# Patient Record
Sex: Male | Born: 1945 | Race: White | Hispanic: No | Marital: Married | Smoking: Former smoker
Health system: Southern US, Community
[De-identification: ages and names within clinical notes are randomized; demographics above are authoritative.]

## PROBLEM LIST (undated history)

## (undated) DIAGNOSIS — R011 Cardiac murmur, unspecified: Secondary | ICD-10-CM

## (undated) DIAGNOSIS — E039 Hypothyroidism, unspecified: Secondary | ICD-10-CM

## (undated) DIAGNOSIS — I517 Cardiomegaly: Secondary | ICD-10-CM

## (undated) DIAGNOSIS — E119 Type 2 diabetes mellitus without complications: Secondary | ICD-10-CM

## (undated) DIAGNOSIS — E785 Hyperlipidemia, unspecified: Secondary | ICD-10-CM

## (undated) DIAGNOSIS — K219 Gastro-esophageal reflux disease without esophagitis: Secondary | ICD-10-CM

## (undated) DIAGNOSIS — R1310 Dysphagia, unspecified: Secondary | ICD-10-CM

## (undated) DIAGNOSIS — C189 Malignant neoplasm of colon, unspecified: Secondary | ICD-10-CM

## (undated) DIAGNOSIS — F17201 Nicotine dependence, unspecified, in remission: Secondary | ICD-10-CM

## (undated) DIAGNOSIS — I2510 Atherosclerotic heart disease of native coronary artery without angina pectoris: Secondary | ICD-10-CM

## (undated) DIAGNOSIS — I219 Acute myocardial infarction, unspecified: Secondary | ICD-10-CM

## (undated) DIAGNOSIS — I10 Essential (primary) hypertension: Secondary | ICD-10-CM

## (undated) DIAGNOSIS — M47812 Spondylosis without myelopathy or radiculopathy, cervical region: Secondary | ICD-10-CM

## (undated) DIAGNOSIS — N3090 Cystitis, unspecified without hematuria: Secondary | ICD-10-CM

## (undated) DIAGNOSIS — I493 Ventricular premature depolarization: Secondary | ICD-10-CM

## (undated) HISTORY — DX: Hyperlipidemia, unspecified: E78.5

## (undated) HISTORY — PX: CHOLECYSTECTOMY: SHX55

## (undated) HISTORY — DX: Type 2 diabetes mellitus without complications: E11.9

## (undated) HISTORY — PX: FACIAL FRACTURE SURGERY: SHX1570

## (undated) HISTORY — PX: MOUTH SURGERY: SHX715

## (undated) HISTORY — PX: OTHER SURGICAL HISTORY: SHX169

## (undated) HISTORY — DX: Nicotine dependence, unspecified, in remission: F17.201

## (undated) HISTORY — DX: Atherosclerotic heart disease of native coronary artery without angina pectoris: I25.10

## (undated) HISTORY — DX: Ventricular premature depolarization: I49.3

## (undated) HISTORY — DX: Essential (primary) hypertension: I10

---

## 2000-02-17 ENCOUNTER — Inpatient Hospital Stay (HOSPITAL_COMMUNITY): Admission: EM | Admit: 2000-02-17 | Discharge: 2000-02-21 | Payer: Self-pay | Admitting: Emergency Medicine

## 2000-02-17 ENCOUNTER — Encounter: Payer: Self-pay | Admitting: Cardiology

## 2000-08-15 ENCOUNTER — Ambulatory Visit (HOSPITAL_COMMUNITY): Admission: RE | Admit: 2000-08-15 | Discharge: 2000-08-16 | Payer: Self-pay | Admitting: *Deleted

## 2000-08-17 ENCOUNTER — Emergency Department (HOSPITAL_COMMUNITY): Admission: EM | Admit: 2000-08-17 | Discharge: 2000-08-17 | Payer: Self-pay | Admitting: Emergency Medicine

## 2000-10-23 ENCOUNTER — Encounter: Payer: Self-pay | Admitting: *Deleted

## 2000-10-23 ENCOUNTER — Encounter: Payer: Self-pay | Admitting: Cardiothoracic Surgery

## 2000-10-23 HISTORY — PX: CORONARY ARTERY BYPASS GRAFT: SHX141

## 2000-10-24 ENCOUNTER — Encounter: Payer: Self-pay | Admitting: Cardiothoracic Surgery

## 2000-10-24 ENCOUNTER — Inpatient Hospital Stay (HOSPITAL_COMMUNITY): Admission: AD | Admit: 2000-10-24 | Discharge: 2000-10-28 | Payer: Self-pay | Admitting: *Deleted

## 2000-10-25 ENCOUNTER — Encounter: Payer: Self-pay | Admitting: Cardiothoracic Surgery

## 2000-10-26 ENCOUNTER — Encounter: Payer: Self-pay | Admitting: Cardiothoracic Surgery

## 2000-11-06 DIAGNOSIS — M47812 Spondylosis without myelopathy or radiculopathy, cervical region: Secondary | ICD-10-CM

## 2000-11-06 HISTORY — DX: Spondylosis without myelopathy or radiculopathy, cervical region: M47.812

## 2001-03-08 ENCOUNTER — Ambulatory Visit (HOSPITAL_COMMUNITY): Admission: RE | Admit: 2001-03-08 | Discharge: 2001-03-08 | Payer: Self-pay | Admitting: Neurosurgery

## 2001-03-08 ENCOUNTER — Encounter: Payer: Self-pay | Admitting: Neurosurgery

## 2001-03-20 ENCOUNTER — Observation Stay (HOSPITAL_COMMUNITY): Admission: RE | Admit: 2001-03-20 | Discharge: 2001-03-21 | Payer: Self-pay | Admitting: Neurosurgery

## 2001-03-20 ENCOUNTER — Encounter: Payer: Self-pay | Admitting: Neurosurgery

## 2001-03-20 HISTORY — PX: ANTERIOR CERVICAL DISCECTOMY: SHX1160

## 2001-03-24 ENCOUNTER — Emergency Department (HOSPITAL_COMMUNITY): Admission: EM | Admit: 2001-03-24 | Discharge: 2001-03-24 | Payer: Self-pay | Admitting: *Deleted

## 2001-03-24 ENCOUNTER — Encounter: Payer: Self-pay | Admitting: Neurosurgery

## 2001-04-15 ENCOUNTER — Encounter: Payer: Self-pay | Admitting: Neurosurgery

## 2001-04-15 ENCOUNTER — Encounter: Admission: RE | Admit: 2001-04-15 | Discharge: 2001-04-15 | Payer: Self-pay | Admitting: Neurosurgery

## 2001-05-21 ENCOUNTER — Encounter: Admission: RE | Admit: 2001-05-21 | Discharge: 2001-05-21 | Payer: Self-pay | Admitting: Neurosurgery

## 2001-05-21 ENCOUNTER — Encounter: Payer: Self-pay | Admitting: Neurosurgery

## 2005-01-24 ENCOUNTER — Ambulatory Visit: Payer: Self-pay | Admitting: Cardiology

## 2005-01-30 ENCOUNTER — Ambulatory Visit: Payer: Self-pay

## 2006-02-22 ENCOUNTER — Ambulatory Visit: Payer: Self-pay | Admitting: Cardiology

## 2006-03-09 ENCOUNTER — Ambulatory Visit: Payer: Self-pay

## 2007-02-01 ENCOUNTER — Ambulatory Visit: Payer: Self-pay | Admitting: Cardiology

## 2007-08-29 ENCOUNTER — Ambulatory Visit: Payer: Self-pay | Admitting: Cardiology

## 2008-02-18 ENCOUNTER — Ambulatory Visit: Payer: Self-pay | Admitting: Cardiology

## 2008-09-03 ENCOUNTER — Ambulatory Visit: Payer: Self-pay | Admitting: Cardiovascular Disease

## 2009-02-19 DIAGNOSIS — E785 Hyperlipidemia, unspecified: Secondary | ICD-10-CM | POA: Insufficient documentation

## 2009-02-19 DIAGNOSIS — I10 Essential (primary) hypertension: Secondary | ICD-10-CM

## 2009-02-19 DIAGNOSIS — I25810 Atherosclerosis of coronary artery bypass graft(s) without angina pectoris: Secondary | ICD-10-CM | POA: Insufficient documentation

## 2009-02-19 DIAGNOSIS — E119 Type 2 diabetes mellitus without complications: Secondary | ICD-10-CM

## 2009-02-22 ENCOUNTER — Encounter: Payer: Self-pay | Admitting: Cardiovascular Disease

## 2009-02-22 ENCOUNTER — Ambulatory Visit: Payer: Self-pay | Admitting: Cardiovascular Disease

## 2009-02-22 DIAGNOSIS — I10 Essential (primary) hypertension: Secondary | ICD-10-CM | POA: Insufficient documentation

## 2009-08-23 ENCOUNTER — Ambulatory Visit: Payer: Self-pay | Admitting: Cardiovascular Disease

## 2009-08-23 ENCOUNTER — Telehealth (INDEPENDENT_AMBULATORY_CARE_PROVIDER_SITE_OTHER): Payer: Self-pay

## 2009-08-23 DIAGNOSIS — I2510 Atherosclerotic heart disease of native coronary artery without angina pectoris: Secondary | ICD-10-CM | POA: Insufficient documentation

## 2009-08-23 DIAGNOSIS — R5383 Other fatigue: Secondary | ICD-10-CM

## 2009-08-23 DIAGNOSIS — R5381 Other malaise: Secondary | ICD-10-CM

## 2009-08-24 ENCOUNTER — Encounter: Payer: Self-pay | Admitting: Cardiovascular Disease

## 2009-08-24 ENCOUNTER — Encounter (HOSPITAL_COMMUNITY): Admission: RE | Admit: 2009-08-24 | Discharge: 2009-08-24 | Payer: Self-pay | Admitting: Internal Medicine

## 2009-08-24 ENCOUNTER — Ambulatory Visit (HOSPITAL_COMMUNITY): Admission: RE | Admit: 2009-08-24 | Discharge: 2009-08-24 | Payer: Self-pay | Admitting: Internal Medicine

## 2009-08-24 ENCOUNTER — Ambulatory Visit: Payer: Self-pay | Admitting: Internal Medicine

## 2009-08-24 ENCOUNTER — Ambulatory Visit: Payer: Self-pay

## 2009-10-13 ENCOUNTER — Encounter (INDEPENDENT_AMBULATORY_CARE_PROVIDER_SITE_OTHER): Payer: Self-pay | Admitting: *Deleted

## 2011-03-21 NOTE — Assessment & Plan Note (Signed)
[facility]                            CARDIOLOGY OFFICE NOTE   NAME:[name], [name] SAO                         MRN:          [id number]  DATE:08/29/2007                            DOB:          [birth year]    PRIMARY CARE PHYSICIAN:  [name] [name], M.D.   REASON FOR VISIT:  Cardiac followup.   HISTORY OF PRESENT ILLNESS:  [name] reports he has been doing well  since his last visit in March.  He is not having any angina or  breathlessness.  He states that Dr. Jeannetta [name] has been following his  cholesterol, and he is on the medications outlined below.  Today's  electrocardiogram shows sinus bradycardia at 56 beats per minute with  borderline inferior Q waves, most noted in lead III (old).  [name] is  not experiencing any palpitations or syncope.  He admits that he has not  been exercising at all.  I spoke with him about considering a regular  exercise regimen such as walking.   ALLERGIES:  Some type of ANTIBIOTIC.   PRESENT MEDICATIONS:  1. Protonix 40 mg p.o. daily.  2. Lovastatin 40 mg p.o. daily.  3. Levoxyl 100 mg p.o. daily.  4. Aspirin 81 mg p.o. daily.  5. Niacin 500 mg p.o. daily.  6. Metoprolol 25 mg p.o. b.i.d.  7. Omega 3 supplements 1000 mg p.o. daily.  8. Metformin 500 mg 1 tablet in the morning and 1/2 tablet in the      evening.  9. Sublingual nitroglycerin p.r.n. (has not needed).   PHYSICAL EXAMINATION:  Blood pressure 133/76, heart rate 56, weight 223  pounds, up from 218.  This is an overweight male in no acute distress.  Examination of the neck reveals no elevated jugular venous pressure, no  loud bruits, no thyromegaly noted.  LUNGS:  Clear without labored breathing.  CARDIAC:  Regular rate and rhythm.  No S3 gallop.  EXTREMITIES:  No significant pitting edema.   IMPRESSION/RECOMMENDATIONS:  Coronary artery disease with previous  inferior wall myocardial infarction and subsequent coronary artery  bypass grafting.  Myoview  in May, 2007 showed no large areas of  ischemia.  He has developed no new symptoms, and at this point, we will  plan to continue medical therapy and observation.  I have recommended  aggressive LDL  control around 70.  He continues on Lovastatin at 40 mg daily.  Otherwise, we talked about a basic walking regimen, and I will plan to  see him back over the next six months.     [name] Sidle, MD  Electronically Signed    SGM/MedQ  DD: 08/29/2007  DT: 08/30/2007  Job #: [id number]   cc:   [name] [name], M.D.

## 2011-03-21 NOTE — Assessment & Plan Note (Signed)
[facility]ALTH[facility]ARE                            CARDIOLOGY OFFICE NOTE   NAME:[name] [name], [name] [name]                         MRN:          [id number]  DATE:09/03/2008                            DOB:          1946/02/26    PRIMARY CARE PHYSICIAN:  Windle Guard, MD   REASON FOR VISIT:  Cardiology followup.   HISTORY OF PRESENT ILLNESS:  Mr. Soltys is a 66 year old Caucasian male  with past medical history significant for coronary artery disease status  post 3-vessel coronary artery bypass grafting surgery in 2001,  hypertension, diabetes mellitus and hyperlipidemia as well as remote  tobacco abuse, who has recently been followed by Dr. Nona Dell.  Dr. Diona Browner is no longer seeing the patient in this clinic.  The  patient comes in today for his first visit with me.  He denies any chest  discomfort, shortness of breath with exertion, palpitations, dizziness,  near syncope, syncope, orthopnea, PND, or lower extremity edema.  He has  not described any signs or symptoms that are suggestive of angina,  arrhythmias, or congestive heart failure.  He is tolerating his present  medications and does not describe any adverse side effects.   ALLERGIES:  No known drug allergies.   CURRENT MEDICATIONS:  1. Lovastatin 40 mg once daily.  2. Levoxyl 100 mg once daily.  3. Aspirin 81 mg once daily.  4. Metoprolol 25 mg twice daily.  5. Fish oil 1 g once daily.  6. Nexium 40 mg once daily.  7. Niacin 1 g b.i.d.  8. Metformin 1 g in the morning and 500 mg in the evening.  9. Nitroglycerin sublingual p.r.n.   PAST MEDICAL HISTORY:  1. Coronary artery disease, status post 3-vessel CABG in 2001.  2. Hypertension.  3. Hyperlipidemia.  4. Diabetes mellitus.  5. Tobacco abuse with 3 packs per day smoked for 35 years.  However,      the patient has not smoked in the last 8 years.   PAST SURGICAL HISTORY:  1. Cholecystectomy.  2. Bone spur removed from the right shoulder.  3.  Three-vessel CABG in 2001.  4. Pineal cyst removal.  5. Neck surgery.   REVIEW OF SYSTEMS:  As stated in the history present illness and is  otherwise negative.   PHYSICAL EXAMINATION:  VITALS:  Blood pressure 130/75, pulse 62 and  regular, respirations 12 and unlabored.  GENERAL:  He is a middle-aged Caucasian male, in no acute distress.  His  alert and oriented x3.  SKIN:  Warm and dry.  NECK:  No JVD.  No carotid bruits.  No thyromegaly.  No lymphadenopathy.  MUSCULOSKELETAL:  Muscle strength and tone is normal.  NEUROLOGICAL:  No focal neurological deficits.  PSYCHIATRIC:  Mood and affect are appropriate.  LUNGS:  Clear to auscultation bilaterally without wheezes, rhonchi, or  crackles noted.  CARDIOVASCULAR:  Regular rate and rhythm without murmurs, gallops, or  rubs noted.  ABDOMEN:  Soft, nontender, nondistended.  Bowel sounds are present.  EXTREMITIES:  No evidence of edema.  Pulses are 2+ in all extremities.   ASSESSMENT  AND PLAN:  This is a pleasant 65 year old Caucasian male with  past medical history significant for coronary artery disease status post  3-vessel CABG in 2001, diabetes mellitus, hypertension, hyperlipidemia,  and remote tobacco abuse who presents for routine cardiology followup.  The patient seems to be doing well from a cardiovascular standpoint.  He  is on appropriate medications for his coronary artery disease.  He is  tolerating all of his medications well currently.  I would like to see  him back in this office in 6 months.  I will make no changes in his  medicines in the meantime.  He is aware that he should call our office  if he has any change in his clinical status over the neck 6 months.     Verne Carrow, MD  Electronically Signed    CM/MedQ  DD: 09/03/2008  DT: 09/04/2008  Job #: [id number]   cc:   Windle Guard, M.D.

## 2011-03-21 NOTE — Assessment & Plan Note (Signed)
[facility]                            CARDIOLOGY OFFICE NOTE   [name] [name], [name] [name]                         MRN:          [id number]  DATE:02/18/2008                            DOB:          [birth year]    PRIMARY CARE PHYSICIAN:  [name] [name], M.D.   REASON FOR VISIT:  Cardiology follow-up.   HISTORY OF PRESENT ILLNESS:  [name] is doing well.  He just had his  physical earlier today with Dr. Jeannetta [name].  He reports no significant  angina and stable NYHA class II dyspnea on exertion.  His  electrocardiogram shows sinus bradycardia with otherwise no significant  changes new over baseline tracing last year.  He is tolerating his  present medications and I do note that his niacin has been further  advanced.   ALLERGIES:  No known drug allergies.   MEDICATIONS:  1. Lovastatin 40 mg p.o. daily.  2. Levoxyl 100 mg p.o. daily.  3. Aspirin 81 mg p.o. daily.  4. Metoprolol 25 mg p.o. b.i.d.  5. Omega-3 supplements 1000 mg p.o. daily.  6. Nexium 40 mg p.o. daily.  7. Niacin 1000 mg p.o. b.i.d.  8. Metformin 1000 mg the morning and 500 mg in the evening.  9. Sublingual nitroglycerin p.r.n.   REVIEW OF SYSTEMS:  As described in the history of present illness.  Otherwise negative.   EXAMINATION:  Blood pressure is 150/82, heart rate is 56 weight.  Weight  is 2 23 pounds, which is stable.  He is an overweight male in no acute distress.  HEENT:  Conjunctivae and lids normal.  Pharynx is clear.  NECK:  Supple.  No elevated jugular venous pressure, no loud bruits.  No  thyromegaly is noted.  LUNGS:  Clear without labored breathing at rest.  CARDIAC:  A regular rate and rhythm.  No loud murmur or gallop.  ABDOMEN:  Soft, nontender.  EXTREMITIES:  No frank pitting edema.  Distal pulses 2+.   IMPRESSION AND RECOMMENDATIONS:  Multivessel coronary artery disease,  status post previous inferior wall myocardial infarction with subsequent  coronary artery bypass  grafting.  The patient is stable in terms of symptoms on a good medical regimen.  I  will plan to see him back over the next 6 months.     [name] Sidle, MD  Electronically Signed    SGM/MedQ  DD: 02/18/2008  DT: 02/18/2008  Job #: [id number]   cc:   [name] [name], M.D.

## 2011-03-24 NOTE — Discharge Summary (Signed)
[place]  Patient:    [name] [name], [name] [name]                         MRN: [id number] Adm. Date:  [id number] Disc. Date: 08/16/00 Attending:  Veneda Melter Dictator:   Tereso Newcomer, P.A. CC:         Hadassah Pais. Jeannetta Nap, M.D., phone # [phone]   Referring Physician Discharge [place]  DATE OF BIRTH:  April 09, 1946  REASON FOR ADMISSION:  Outpatient catheterization secondary to dyspnea on exertion.  PROCEDURES PERFORMED THIS ADMISSION: 1. Cardiac catheterization on August 15, 2000 by Dr. Veneda Melter, revealing    left main distal 20%; LAD 30-40% proximally, 80% mid after diagonal;    left circumflex with two OMs, 30%; RCA dominant; PDA and two PD    branches, 70% proximal/mid at stent #1, 50% proximal PDA; no MR; EF    greater than 55%. 2. Status post PTCA/stent to proximal RCA between previous two stents. 3. Status post PTA to right iliac secondary to iatrogenic dissection,    resulting in compromise of the lumen by approximately 70%.  DISCHARGE DIAGNOSES: 1. Coronary artery disease. 2. 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  Complicated by periprocedure ventricular fibrillation and    shock x 3. Cardiolite study Mar 30, 1999 with inferobasilar wall infarct    and mild to moderate peri-infarct ischemia involving the inferior wall    from apex to base - ejection fraction 71%. 4. Low HDL. 5. History of tobacco use. 6. Remote gallbladder surgery.  ADMISSION HISTORY:  This 65 year old male with the above-noted history was seen in the office on August 10, 2000.  He presented mainly at the urging of his family members for complaints of increasing fatigue and dyspnea on exertion.  He had some chest pain, but this was different from his pre MI symptoms.  Prior to his MI, he had pain across his shoulders.  In the office he was complaining of vague, migrating chest pain, that he related to gas.  He did not take  any nitroglycerin.  INITIAL PHYSICAL EXAMINATION IN THE OFFICE:  GENERAL:  Revealed an alert, well-nourished male in no acute distress.  VITAL SIGNS:  Blood pressure 140/90, weight 227 pounds, pulse 51.  NECK:  Without bruits, no JVD.  CHEST:  Clear to auscultation.  CARDIAC:  Regular rhythm without murmur, rub, or gallop.  ABDOMEN:  Nontender, no hepatosplenomegaly.  EXTREMITIES:  Without edema.  LABORATORY DATA:  EKG normal sinus rhythm, sinus bradycardia, no acute changes.  HOSPITAL COURSE:  It was decided that the patient should be scheduled for outpatient catheterization to evaluate his symptoms.  He came into [facility] on August 15, 2000.  The procedure was performed on the same day, and the results are noted above.  Please refer to the dictated catheterization note by Dr. Chales Abrahams for complete details.  The patient tolerated the procedure well, had no immediate complications.  On the morning of August 16, 2000 he was found to be in stable condition.  His right groin wound was without hematoma, bruits.  Laboratory prior to discharge showed white count 10,200; hemoglobin 14; hematocrit 39.9; platelet count 142,000.  Glucose 119, sodium 137, potassium 4.2, chloride 101, CO2 29, BUN 12, creatinine 1.1.  Therefore, at the time of this dictation, it was decided that after the patients rest period was up and his Integrilin was finished, as long  as he was stable, he could be discharged to home.  DISCHARGE MEDICATIONS: 1. Plavix 75 mg q.d. x one month. 2. Toprol XL 25 mg q.d. 3. Niaspan 500 mg q.h.s. 4. Coated aspirin 325 mg q.d. 5. Nitroglycerin 0.4 mg sublingual p.r.n. chest pain.  ACTIVITY:  No driving, heavy lifting, exertion, or sex for three days.  DIET:  Low fat, low salt diet.  WOUND CARE:  The patient should watch his groin for any increased swelling, bleeding, or bruising and call our office with concerns.  FOLLOW-UP:  With Dr. Andee Lineman in two weeks. DD:   08/16/00 TD:  08/16/00 Job: [id number]

## 2011-03-24 NOTE — Cardiovascular Report (Signed)
[place]  Patient:    [name] [name], [name] [name]                         MRN: [id number] Proc. Date: 10/23/00 Adm. Date:  [id number] Attending:  Waldo Laine CC:         Lewayne Bunting, M.D.             Hadassah Pais. Jeannetta Nap, M.D.                        Cardiac Catheterization  PROCEDURES PERFORMED: 1. Left heart catheterization. 2. Left ventriculogram. 3. Selective coronary angiography. 4. Abdominal aortogram. 5. Percutaneous transluminal coronary angioplasty and rotational    atherectomy of the distal left anterior descending.  DIAGNOSES: 1. Two-vessel coronary artery disease. 2. Normal left ventricular systolic function.  INDICATIONS:  [name] [name] is a 65 year old white male with a known history of coronary artery disease, who has suffered an inferior wall myocardial infarction, April of 2001.  He was treated with acute intervention to the LAD. The patient was then treated medically but presented with recurrence of discomfort.  Cardiolite scan showed ischemia in the inferior wall, and on recatheterization August 15, 2000, he was found to have re-stenosis of the right coronary artery between two stents in the mid section of the vessel, as well as significant narrowing proximal to the stents.  Percutaneous intervention was performed.  The patient had moderate disease in the posterior descending artery as well as in the distal LAD with well preserved LV function.  Medical management was again pursued; however, the patient had recurrence of chest discomfort over the past few weeks, prompting presentation for relook angiogram and probable intervention to the distal LAD.  TECHNIQUE:  After informed consent was obtained, the patient was brought to the cardiac catheterization lab where both groins were sterilely prepped and draped.  A 7 French sheath was placed into the right femoral artery.  A 6 French Judkins catheter was then used to perform left heart  catheterization in the usual fashion.  FINDINGS:  Initial findings are as follows: 1. Left main trunk:  The left main trunk is a large caliber vessel with a    distal narrowing of 20%. 2. Left anterior descending:  This is a large caliber vessel that provides    a diagonal branch in the mid section as well as several septal perforators    in the proximal and mid section.  The LAD has a long eccentric narrowing    of 50% in the mid section prior to the diagonal branch and a focal    high-grade narrowing of 90% distally in the LAD immediately after the    takeoff of the diagonal branch.  The distal LAD has mild diffuse disease    as of the diagonal branch. 3. Left circumflex artery:  This is a large caliber vessel that provides a    medium caliber first marginal branch in the proximal segment and a    larger second marginal branch distally.  The AV circumflex has a narrowing    of 30% between the first and second marginal branch.  The first marginal    branch has a narrowing of 60% in the proximal segment. 4. Right coronary artery:  The right coronary artery is dominant.  This is a    large caliber vessel that provides a posterior descending artery and a    small posterior  ventricular branch in its terminal segment.  There is an    ostial narrowing of 50% within the area of previous stenting.  The    remainder of the stented segment in the proximal and mid section of the    vessel has mild disease of 20%.  The ostium of the posterior descending    artery has a focal narrowing of 70%.  ABDOMINAL AORTOGRAM:  Normal caliber.  The renal arteries are single and patent bilaterally without significant stenosis.  The iliac arteries are patent bilaterally.  There is no residual disease or impedence to flow in the right iliac artery in the area of previous angioplasty.  LEFT VENTRICULOGRAM:  Normal end-systolic and end-diastolic dimensions. Overall left ventricular function is well preserved,  ejection fraction of greater than 60%.  No mitral regurgitation.  LV pressure is 100/5, aortic is 100/63.  LVEDP equals 22.  DESCRIPTION OF PROCEDURE:  With these findings, we elected to proceed with percutaneous intervention of the distal LAD.  The patient was given heparin and Integrilin on a weight-adjusted basis to maintain ACT of greater than 250 seconds, and a 7 [place] guide catheter used to engage the left coronary artery.  A 0.014 inch Patriot wire was advanced in the distal LAD 3.0 x 15 mm Maverick balloon introduced.  Two inflations were performed at 10 and 14 atmospheres for 30 seconds.  There was still considerable residual waste at the site of severe stenosis.  An attempt was then made to pass a Cutting Balloon as well as a stent into the distal LAD.  Both of these were unsuccessful and we elected to proceed with rotational atherectomy.  The Patriot wire exchanged for a Rotagold wire and a 1.75 mm bur introduced.  Two passes were made in the distal LAD at 160,000 RPMs for a total 13 seconds. Repeat angiography showed sluggish flow in the distal LAD corresponding with acute vessel closure.  Upon removal of the bur wire, position was unfortunately lost and could not be re-obtained.  Multiple wires were introduced.  However, the true lumen of the distal LAD could not be recannulated.  The patient had chest discomfort, both hemodynamically stable. Emergent surgical consult was obtained and preparations made to perform coronary artery bypass graft surgery.  The Integrilin drip was discontinued. Blood work was sent to the lab for type and cross, and the patient transferred to the operating room in stable condition.  FINAL RESULT:  Unsuccessful attempt at percutaneous intervention to the distal left anterior descending with abrupt vessel closure, and urgent coronary artery bypass graft surgery. DD:  10/23/00 TD:  10/24/00 Job: [id number]

## 2011-03-24 NOTE — Discharge Summary (Signed)
[place]. [facility]  Patient:    [name] [name], [name] [name]                         MRN: [id number] Adm. Date:  [id number] Disc. Date: [id number] Attending:  Learta Codding Dictator:   Leonides Cave, P.A. CC:         [name] [name], M.D.             Dr. Carlisle Cater, [place]                           Discharge Summary  DATE OF BIRTH:  05/03/1946  DISCHARGE DIAGNOSES: 1. Acute inferior wall myocardial infarction, status post percutaneous    transluminal coronary angioplasty and stent to the right coronary artery    complicated by ventricular fibrillation with direct-current cardioversion    x 3 during cardiac catheterization. 2. Low HDL (treated with Niaspan at time of discharge). 3. Mild hypotension/bradycardia (follow closely with the addition of Toprol XL    and Altace). 4. Tobacco abuse (the patient plans to quit).  BRIEF HISTORY:  The patient is a 65 year old married white male who presented to [facility] complaining of severe chest pain.  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 He developed shortness of breath, nausea, vomiting, and diaphoresis.  The patient witnessed a syncopal spell and incontinence.  The patient was taken emergently to the catheterization lab by Dr. Veneda Melter.  HOSPITAL COURSE:  The patient had started on aspirin, heparin, and Integrilin. Catheterization results revealed left main with a distal 10-15% lesion, LAD with a 40% proximal lesion, an 80% mid lesion after the first diagonal, and the first diagonal had mild disease.  Left circumflex had a proximal obtuse marginal branch and distal OM2 with a 30-40% lesion at the mid AV circumflex. The RCA was dominant and large.  He had a PDA and two PV branches.  There was 100% occlusion in the mid RCA, which was reduced to less than 10% after PTCA and stent.  TIMI-0  flow was improved to TIMI-3 flow.  Stable dissection at the proximal bend was noted.  There was no MR seen and the patient was found to have greater than 50% ejection fraction.  As noted above, the patient did have ventricular fibrillation while on the table and was shocked three times.  The patient had a temporary pacemaker for a short time period after his heart catheterization.  The patient was kept in the unit for several days after his heart catheterization.  The patient did have one three-beat run of nonsustained ventricular tachycardia; however, this was just observed, as the patients EF was greater than 50%.  Beta blockade was continued as well.  On February 21, 2000, the patient was discharged home in stable condition.  DISCHARGE MEDICATIONS: 1. Xanax 0.25 p.r.n.  This will help with his anxiety and smoking urges. 2. Toprol XL 50 mg 1/2 tablet q.d. 3. Plavix 75 mg q.d. for four weeks. 4. Coated aspirin q.d. indefinitely. 5. Nitroglycerin 0.4 p.r.n. 6. Altace 1.25 b.i.d. 7. Niaspan 500 q.h.s.  DISCHARGE INSTRUCTIONS:  The patient was told to undergo no heavy activity or exertion and was told not to return to work until seen in follow-up with Dr. [name].  DIET:  He will continue a low fat/low  cholesterol diet.  FOLLOW-UP:  The patients follow-up with be Mar 07, 2000 at 4:15 p.m. with Dr. [name] at the [facility] office. Dr. Andee Lineman can set him up for a ______ Cardiolite at that time, in which he will need six weeks post discharge.  LABORATORY DATA:  Cholesterol panel:  The patients total cholesterol is 129, triglycerides 87, HDL 20, LDL 92. DD:  02/21/00 TD:  02/21/00 Job: [id number]

## 2011-03-24 NOTE — Consult Note (Signed)
. [facility]  Patient:    [name] [name], [name] [name]                         MRN: [id number] Adm. Date:  [id number] Attending:  Veneda Melter CC:         Veneda Melter, M.D. [facility]   Consultation Report  REFERRING PHYSICIAN:  Veneda Melter, M.D.  REASON FOR CONSULTATION:  In-hospital emergency stat consult to the cath lab.  HISTORY OF PRESENT ILLNESS:  The patient is a 65 year old male admitted this morning as an outpatient for cardiac catheterization.  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  At that time, he underwent acute angioplasty of the proximal right.  He had recurrent symptoms in October and had a repeat angioplasty of the right coronary artery.  During the initial procedure in April, the right iliac artery was dissected, and he required angioplasty of this.  Today, the patient was to undergo repeat cardiac catheterization.  This was done, which demonstrated 80% proximal posterior descending obstruction, 50 to 60% ostial right obstruction, luminal irregularities of the circumflex, greater than 95% stenosis of the LAD just past the takeoff of a large diagonal branch.  Attempted angioplasty of the LAD was undertaken, including a Rotablator.  Following this was complete occlusion of the artery.  A stat consult was called.  The patient was having prolonged, severe chest pain, asking that the balloon be let down.  PAST MEDICAL HISTORY: 1. History of tobacco use until April 2001. 2. History of low HDL. 3. 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  MEDICATIONS:  Enteric-coated aspirin, Niaspan, Toprol, and Plavix.  REVIEW OF SYSTEMS:  The patient denied any symptoms of  diabetes.  He has had no TIAs or strokes.  He has had one near-presyncopal episode several weeks prior to this admission; this spontaneously resolved without recurrence.  He does complain of claudication in lower extremities and also cramping at night in his legs.  He denies any blood in his urine or stool.  Other review of systems is negative.  PHYSICAL EXAMINATION:  GENERAL:  The patient is in obvious discomfort on the cath lab table.  The patient is moderately obese.  VITAL SIGNS:  Blood pressure is 110/70, heart rate in the 60s and sinus.  NECK:  He has no jugular venous distention.  He has no carotid bruits.  LUNGS:  Without wheezing.  ABDOMEN:  Moderately obese abdomen without palpable masses.  EXTREMITIES:  There is a left femoral sheath in place.  The patient has palpable DP and PT pulses and appears to have adequate veins for bypass.  IMPRESSION AND PLAN:  Emergency coronary artery bypass grafting is recommended after reviewing the films as indicated above.  The risk of emergency procedure and benefits are discussed with the patient and his wife in detail, including the risk of death, infection, stroke, myocardial infarction, bleeding, and blood transfusion.  Specifically, with the patient having been on Integrilin and Plavix, the likelihood of transfusion is high.  The patient is agreeable with proceeding.  He was taken direct to the operating room from the cath lab. DD:  10/23/00 TD:  10/23/00 Job: [id number]

## 2011-03-24 NOTE — Cardiovascular Report (Signed)
[place]. [facility]  Patient:    [name] [name], [name] [name]                         MRN: [id number] Proc. Date: 02/17/00 Adm. Date:  [id number] Attending:  Learta Codding CC:         Veneda Melter, M.D. [facility]             Lewayne Bunting, M.D.                        Cardiac Catheterization  PROCEDURES PERFORMED: 1. Left heart catheterization. 2. Left ventriculogram. 3. Percutaneous transluminal coronary angioplasty and stenting of the mid    right coronary artery. 4. Percutaneous transluminal coronary angioplasty and stenting of the proximal    right coronary artery. 5. Placement of temporary right ventricle pacemaker. 6. Direct-current cardioversion.  DIAGNOSES: 1. Two-vessel coronary artery disease. 2. Normal left ventricular systolic function. 3. Ventricular fibrillation. 4. Sinus arrest.  INDICATIONS:  Mr. Cornia is a 65 year old gentleman without previous cardiac history who presents with acute inferior posterior wall myocardial infarction with RV involvement.  The patient had bradycardia and hypotension and had out-of-hospital syncope.  He was stabilized in the emergency room where echocardiogram showed preserved LV function with severe RV function.  He is brought emergently to the  cardiac catheterization lab.  TECHNIQUE:  After informed consent was obtained, the patient was brought to the  cardiac catheterization lab where both groins were sterilely prepped and draped. Lidocaine 1% was used to infiltrate the right groin.  A 7 French arterial and 6  French venous sheath placed using the modified Seldinger technique.  The 6 [place] and JR4 catheters were then used to engage the left and right coronary arteries, and selective angiography performed in various projections using manual injections of contrast.  A 6 French pigtail catheter was then advanced to the left ventricle and the left ventriculogram was performed using power injections  of contrast.  FINDINGS:  Initial findings are as follows: 1. Left main trunk:  The left main trunk is a large caliber vessel, distal    irregular narrowing of 10-15%. 2. Left anterior descending:  The left anterior descending is a medium caliber    vessel that provides a major first diagonal branch in the mid section as well as    several proximal septal perforators.  The LAD is a moderately and diffusely    diseased in the proximal section to 40%.  There is a high-grade narrowing of  70-80% in the mid section after the first diagonal branch.  The first    diagonal branch has mild diffuse disease. 3. Left circumflex artery:  This is a large caliber vessel that provides a medium    caliber first marginal branch in the proximal segment and a larger second    marginal branch distally.  There is moderate diffuse disease of 30-40%    in the mid AV circumflex. 4. Right coronary artery:  Right coronary artery is dominant.  This is a large    caliber vessel that will later be seen to provide the posterior descending    artery as well as two small posterior ventricular branches.  The right coronary    artery has a high-grade narrowing of 70% in the proximal segment followed by  100% occlusion in the mid section at the proximal bend.  After percutaneous    revascularization, the distal RCA has  narrowings of 50-60% at the takeoff of  the posterior descending artery with narrowings of 50% in the proximal PDA and    a long narrowing of 80% in the proximal segment of a small second posterior    ventricular branch.  No collateral flow is noted to the right coronary artery    from the left coronary artery.  LEFT VENTRICULOGRAM:  Normal end-systolic and end-diastolic dimensions. Overall left ventricular function is well-preserved.  Ejection fraction of greater than  50-55%.  There is no mitral regurgitation.  Severe hypokinesis of the basal inferior wall is noted.  LV pressure is 145/10, aortic  is 145/90, LVEDP equals 3.  With these findings we elected to proceed with percutaneous intervention of the  right coronary artery.  The patient had been given a heparin bolus as well as Integrilin infusion in the emergency room.  This was supplemented during the case. A 7 [place] guide catheter was used to engage the right coronary artery and  0.014 inch Hi-Torque Floppy wire advanced in the distal RCA.  A 3.0 x 20 mm Ranger balloon was introduced and used to pre dilate the mid RCA lesion at 6 atmospheres for 18 seconds.  During balloon inflation, the patient developed bradycardia and hypotension, which required placement of a temporary RV pacemaker, although the  patient recovered his heart rate and blood pressure on his own.  Subsequently, he patient had a transient episode of ventricular fibrillation requiring DC cardioversion.  Repeat angiography was then performed showing recanalization of the vessel with severe diffuse disease along the entire course of the mid RCA with ong narrowings of 70-80%.  A 4.0 x 31 mm NIR Elite stent was then introduced and carefully positioned in the mid RCA.  This was deployed at 12 atmospheres for 32 seconds.  Repeat angiography showed mild residual narrowing of 30% in the mid section of the stent and persistence of disease in the proximal RCA.  The patient again developed transient ventricular fibrillation requiring DC cardioversion. A 4.0 x 12 mm NIR Elite stent was then introduced and deployed in the proximal RCA at 12 atmospheres for 32 seconds.  A 4.0 x 15 mm [place] Ranger was then introduced and  used to post dilate the distal, mid and proximal segments of the first stent in the  mid RCA at 16, 20 and 16 atmospheres for 18 seconds each.  Repeat angiography showed less than 10% residual narrowing in the mid RCA.  There was 0% residual narrowing in the proximal segment.  There was evidence of a linear tear in the proximal bend to the  right coronary artery which was not flow-limiting and was restricted by the two stents.  Angiography was then performed in various  projections showing TIMI-3 flow throughout the right coronary system.  Two small marginal branches were noted in the mid section of the RCA after recanalization. The proximal RV branch was lost following stenting.  During the case the patient had a total of three episodes of ventricular fibrillation requiring cardioversion. At the termination of the case he was hemodynamically stable and neurologically  intact.  He was fully alert and interactive.  In addition, a 5 [place] sheath was placed in the left femoral artery during the case when the transient hypertension developed in anticipation of intra-aortic balloon pump.  The patient responded o IV dopamine which was weaned and terminated by the end of the case.  In addition, he received two boluses of lidocaine at 100 mg each.  The patient was hemodynamically stable at the termination of the case and was transferred to the CCU in stable condition.  FINAL RESULT: 1. Successful percutaneous transluminal coronary angioplasty and stenting in the    mid right coronary artery with reduction of 100% narrowing to less than 10% ith    placement of a 4.0 x 31 mm NIR Elite stent. 2. Successful percutaneous transluminal coronary angioplasty and stenting of the    proximal right coronary artery with reduction of 70% narrowing to 0% with    placement of a 4.0 x 12 mm NIR Elite stent. DD:  02/15/00 TD:  02/18/00 Job: [id number]

## 2011-03-24 NOTE — Assessment & Plan Note (Signed)
[facility]                            CARDIOLOGY OFFICE NOTE   NAME:Treinen, RONIN REHFELDT                         MRN:          [id number]  DATE:02/01/2007                            DOB:          [birth year]    PRIMARY CARE PHYSICIAN:  Windle Guard, M.D.   REASON FOR VISIT:  Cardiac followup.   HISTORY OF PRESENT ILLNESS:  I saw Mr. Ruben in April of last year.  He  had a history of coronary artery disease, status post coronary artery  bypass grafting with a LIMA to the left anterior descending and  saphenous vein graft to the diagonal as well as saphenous vein graft to  the posterior descending branch of the right coronary artery.  His most  recent ischemic evaluation was via an Adenosine Myoview in May of last  year, which was overall low risk, showing an area of slight apical  ischemia but no large perfusion defects, and a normal ejection fraction  of 64%.  The electrocardiogram today is normal, showing sinus  bradycardia.  Overall, he reports feeling reasonably well without angina  or dyspnea on exertion.  He has been diagnosed with diabetes in the  interim and is now on Metformin.  He has been able to lose a significant  amount of weight.   ALLERGIES:  No known drug allergies.   CURRENT MEDICATIONS:  1. Protonix 40 mg p.o. daily.  2. Lovastatin 40 mg p.o. daily.  3. Niacin 500 mg p.o. daily.  4. Levoxyl 100 mcg p.o. daily.  5. Aspirin 81 mg p.o. daily.  6. Metoprolol 25 mg p.o. b.i.d.  7. Omega III supplements 1000 mg p.o. daily.  8. Metformin 500 mg 2 tablets p.o. q.a.m. and p.o. q.p.m.  9. Sublingual nitroglycerin 0.4 mg sublingual p.r.n.   REVIEW OF SYSTEMS:  As described in the history of present illness.   PHYSICAL EXAMINATION:  VITAL SIGNS:  Blood pressure is 128/72.  Heart  rate is 51.  Weight is 218 pounds, down from around 250 pounds last  year.  GENERAL:  Patient is comfortable in no acute distress.  NECK:  No elevated jugular  venous pressure.  No bruits.  No thyromegaly  is noted.  LUNGS:  Clear without labored breathing.  CARDIAC:  Regular rate and rhythm.  No loud murmur or S3 gallop.  EXTREMITIES:  No pitting edema.   IMPRESSION/RECOMMENDATIONS:  1. Coronary artery disease, status post previous inferior wall      myocardial infarction with subsequent coronary artery bypass      grafting as detailed in my previous note.  The patient is      symptomatically stable and had a favorable Myoview last year.  We      will plan to continue observation and medical therapy, and I will      see him back in the next six months.  2. Otherwise continue to follow up with Dr. Jeannetta Nap for diabetes and      lipid followup.  Ideally, his LDL should be down in the 70 range.     Jonelle Sidle,  MD  Electronically Signed    SGM/MedQ  DD: 02/01/2007  DT: 02/01/2007  Job #: [id number]

## 2011-03-24 NOTE — H&P (Signed)
[place]. [facility]  Patient:    [name] [name], [name] [name]                         MRN: [id number] Adm. Date:  [id number] Disc. Date: [id number] Attending:  Coletta Memos                         History and Physical  ADMITTING DIAGNOSES: 1. Cervical spondylosis with myelopathy. 2. 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 3. Cervical radiculopathy C6-C5.  INDICATIONS:  [name] [name] is a 65 year old gentleman who presented to our office February 25, 2001 for evaluation of pain in the left shoulder and pain in the neck.  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  He has undergone bypass surgery in December 2001.  His cardiologist feels that he is doing fine right now and he came to see me with regards to the neck and arm pain.  No bowel or bladder dysfunction.  No weakness in the upper extremities.  Balance has been fine.  He has not noticed any numbness or tingling.  REVIEW OF SYSTEMS:  Positive for eyeglasses, hypercholesterolemia, arm pain, neck pain.  He denies constitutional, ear, nose, throat, mouth, respiratory, gastrointestinal, genitourinary, skin, neurologic, psychiatric, or endocrine problems.  He is left-handed.  He underwent a three vessel bypass in December.  MEDICATIONS:  He takes aspirin, Niaspan, Toprol, hydrochlorothiazide, and Lipitor.  SOCIAL HISTORY:  He does not smoke, drink, or use illicit drugs.  PHYSICAL EXAMINATION:  VITAL SIGNS:  He is 5 feet 6 inches tall, weighing 218 pounds.  On exam pulse is 72.  GENERAL:  He is alert, oriented x 4 and answering all questions appropriately.  Memory, attention span, fund of knowledge are normal.  Well kept and in no distress.  HEENT/NEUROLOGIC:  Pupils equal, round, and reactive to light.  He has full extraocular movements and a normal funduscopic examination.  Symmetric facial sensation and symmetric facial movements.  Hearing intact to finger rub bilaterally.  Uvula elevates in the  midline.  Shoulder shrug is normal. Tongue protrudes in the midline.  There is 5/5 strength in the lower extremities and right upper extremity.  There is 5-/5 strength in the left biceps and triceps, 5-/5 strength in the left deltoid.  Finger extensors are normal.  Wrist flexors are normal.  Intrinsics and grip are normal.  There is 2+ reflexes at the biceps, triceps, brachial radialis, knees, and ankles. Toes are downgoing to plantar stimulation.  He has a normal gait and a negative Rombergs test.  Muscle tone, bulk, and coordination are normal.  NECK:  There are no cervical masses or bruits.  LUNGS:  Lung fields are clear.  HEART:  Regular rate and rhythm, no murmurs or rubs.  EXTREMITIES:  Pulses good at the wrists and feet bilaterally.  LABORATORY:  MRI shows a large disk and osteophyte atC5-6 on the right side and osetophyte at C4-5 on the left side, some degenerative changes are at C3-4, cervical stenosis at C4-5 and at C5-6, and no tumors or masses accounted for.  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  I therefore recommended and he agreed to undergo anterior cervical diskectomy and arthrodesis.  Risks of the procedure including bleeding, infection, no pain relief, bowel or bladder dysfunction, fusion failure, heart rate failure, recurrent laryngeal nerve injury, and hoarseness  were discussed.  He understands and wishes to proceed. DD:  03/20/01 TD:  03/20/01 Job: [id number]

## 2011-03-24 NOTE — Op Note (Signed)
[place]. [facility]  Patient:    [name] [name], [name] [name]                         MRN: [id number] Proc. Date: 10/23/00 Adm. Date:  [id number] Attending:  Waldo Laine CC:         Veneda Melter, M.D. [facility]   Operative Report  PREOPERATIVE DIAGNOSIS:  Occlusion of left anterior descending with failed angioplasty and Rotablator.  POSTOPERATIVE DIAGNOSIS:  Occlusion of left anterior descending with failed angioplasty and Rotablator with probable disruption of the artery and significant epicardial hemorrhage.  PROCEDURE:  Emergency coronary artery bypass graft x 3 with left internal mammary to the left anterior descending coronary artery, reverse saphenous vein graft to the diagonal coronary artery, reverse saphenous vein graft to the posterior descending coronary artery.  SURGEON:  Dr. Tyrone Sage.  FIRST ASSISTANT:  Dr. Cornelius Moras.  SECOND ASSISTANT:  Lissa Merlin, P.A.  BRIEF HISTORY:  As outlined in consultation note. The patient was admitted today for repeat cardiac catheterization with known coronary occlusive disease.  During the procedure attempt at angioplasty of the LAD resulted in abrupt occlusion of the artery.  There was no obvious extravasation on the catheterization but significant anterior wall hemorrhage was noted at the time of surgery.  The risks and options were discussed with the patient in detail and his family and he was taken urgently to the operating room.  DESCRIPTION OF PROCEDURE:  With Swan-Ganz and arterial line monitors in place the patient underwent general endotracheal anesthesia without incidence. ______ chest and legs were prepped with Betadine and draped in the usual sterile manner.  The vein was harvested from the right lower extremity.  The first segment of vein had significant adhesions and vaso______ which made a very leaky vein and one unsuitable for bypass.  Further up the leg the vein became a very good quality vein.  A  median sternotomy as performed.  The left internal mammary artery was dissected down as a pedicle graft and distal artery was divided and had good free flow.  It had been entertained to use the mammary artery as a sequential graft to the diagonal LAD however, this once opening the pericardium and seeing massive anterior wall hemorrhage throughout the epicardium making it difficult even to locate either the diagonal or the LAD it was decided to do these as separate grafts.  The patient was systemically heparinized.  The ascending aorta and the right atrium were cannulated in the aortic root.  Then cardioplegia needle was introduced into the ascending aorta.  The patient was placed on cardiopulmonary bypass 2.4 liters per minute per m sq.  Sites of anastomoses were selected and dissected out of the epicardium. The patients body temperature was cooled to 30 degrees.  The aortic crossclamp was applied and 500 cc of cold blood potassium cardioplegia was administered with rapid diastolic arrest of the heart.  Myocardial septal temperature was monitored throughout the crossclamp.  Attention was turned first to the posterior descending coronary artery which in the mid portion the proximal portion was intramyocardial and buried under a large vein.  The vessel was opened to admit a 1.5 mm probe.  Using a running 7-0 Prolene distal anastomosis was performed.  Attention was then turned to the diagonal coronary artery which was opened and admitted a 1.5 mm probe proximally and distally.  Using a running 7-0 running Prolene distal anastomosis was performed with a segment of reverse saphenous vein  graft.  Attention was then turned to the left anterior descending coronary artery which was a small vessel not reaching the apex, admitting a 1 mm probe distally and proximally.  Using running 8-0 Prolene the left internal mammary artery was anastomosed to the left anterior descending coronary artery.  With  release of the Edwards bulldog on the mammary artery, there was appropriate rise in the myocardial septal temperature.  The aortic crossclamp was removed.  Total crossclamp time was 45 minutes.  The patient spontaneously converted to a sinus rhythm.  Partial occlusion clamp was placed on the ascending aorta.  Two punch aortotomies were performed.  Each of the two vein grafts were anastomosed to the ascending aorta.  Air was evacuated from the grafts, and the partial occlusion clamp was removed.  Sites of anastomoses were inspected.  The patient was then ventilated, weaned from cardiopulmonary bypass on dopamine infusion and was decannulated in the usual fashion. Protamine and sulfate was administered.  Because of continued oozing and lack of formation of clot and knowing the patients history of being on integrilin and plavix preoperatively platelets and fresh frozen were administered.  With the operative field reasonably hemostatic, the pericardium was reapproximated. After graft markers two atrial and two ventricular pacing wires were applied.  The pericardium was reapproximated.  The sternum was closed with #6 stainless steel wire.  Fascia was closed with interrupted 0 Vicryl, running 3-0 Vicryl in the subcutaneous tissue, 4-0 subcuticular stitch in the skin edges.  Dry dressings were applied.  Sponge and needle counts were reported as correct at the completion of the procedure.  In the future, re-do bypass surgery to either the LAD or the diagonal would be difficult to do because of the poor quality vessel. DD:  10/23/00 TD:  10/24/00 Job: [id number]

## 2011-03-24 NOTE — Consult Note (Signed)
[place]. [facility]  Patient:    [name] [name], [name] [name]                           MRN: [id number] Proc. Date: 03/24/01 Attending:  Payton Doughty, M.D.                          Consultation Report  HISTORY:  This is a 65 year old right-handed white gentleman who called me from home.  He had had an anterior cervical done five days ago and was having increasing feeling of fullness in his neck.  He does note note any difficulty breathing, although he said his throat felt full.  He also has not had any dysphagia, although his wife disputes this slightly.  He has been on narcotic analgesics but no anti-inflammatories.  PHYSICAL EXAMINATION:  NECK:  On palpation, he has no mass in his neck.  There is no stridor in his airway.  CHEST:  Clear.  LABORATORY DATA:  C spine films demonstrate approximately 25 to 27 mm of swelling anterior to his plate but a widely open airway.  On the AP film, there is no deviation of the airway.  ASSESSMENT:  I think this is soft tissue swelling.  There is really no evidence of hematoma, and there is no airway compromise.  I am going to give him a Medrol dosepak to try to get rid of some of the soft tissue swelling.  I told him he could call, and I would be happy to see him back if there are any difficulties. DD:  03/24/01 TD:  03/25/01 Job: [id number]

## 2011-03-24 NOTE — Op Note (Signed)
[place]. [facility]  Patient:    [name] [name], [name] [name]                         MRN: [id number] Proc. Date: 03/20/01 Adm. Date:  [id number] Disc. 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  OPERATION PERFORMED:  Anterior cervical diskectomy C4-5, C5-6, arthrodesis C4 to C6, anterior plating using Synthes small stature plate and two 7 mm iliac crest allograft.  COMPLICATIONS:  None.  SURGEON:  Kyle L. Franky Macho, M.D.  ASSISTANT:  Stefani Dama, M.D.  ANESTHESIA:  General endotracheal.  INDICATIONS FOR PROCEDURE:  The patient is a gentleman who presented with a history of left neck and shoulder pain.  MRI showed significant spondylosis and cervical stenosis at C4-5 and 5-6.  I recommended and he agreed to undergo an anterior cervical diskectomy arthrodesis with anterior plating and allograft.  DESCRIPTION F PROCEDURE:  [name] [name] was brought to the operating room, intubated and placed under general anesthesia without difficulty.  He was placed in slight head extension with 10 pounds of cervical traction applied via chin strap.  His neck was prepped and infiltrated with 5 cc of 0.5% lidocaine, 1:200,000 epinephrine into my proposed incision site.  The patient was draped in a sterile fashion.  I opened the skin with a #10 blade and took this down to the platysma sharply.  I dissected rostrally and caudally above the platysma.  I opened the platysma in a horizontal fashion using Metzenbaum scissors and dissected inferior to it both rostrally and caudally.  I placed the needle in the superior disk space for my exposure and that turned out to be C3-4.  I then turned my attention  to the disk space below and the disk space below that, C4-5 and 5-6.  I opened the disk space using a 15 blade at C4-5 and C5-6.  I then placed Caspar retractors after reflecting the longus coli muscles.  I first did this at C4-5.  I placed the traction pins, one at C5 one in C4.  I opened the disk space with the distraction and then proceeded with a progressive diskectomy using straight curets, pituitary rongeurs, Kerrison punches and a high speed air drill.  There was a great degree of spondylitic change at the disk space and very thickened ligamentum posterior longitudinal ligament.  I opened and removed bone until I was able to decompress the left nerve root aggressively as this is where he was having significant pain.  Less aggressive decompression was done on the right side. There was very good decompression of the spinal cord and nerve roots at that level.  I then irrigated the wound.  A placed a 7 mm allograft into the wound and removed the distraction pin at C4.  I then moved by retractors down to expose C5-6 and I placed a distraction pin in C6.  I had to use a drill in order to gain access to the disk space as there was considerable spurring and very narrow.  I used a drill and was able to gain access to the disk space and  did a significant amount of drilling with the microscope until I took off the bone spur posteriorly coming up superior to C6.  When that was done, I was able to then get underneath the posterior longitudinal ligament with a 1 Kerrison punch and creating more room was then able to use larger Kerrison punches.  I also used a drill to remove more bone and disk until I was then able to decompress very well, both C6 nerve roots.  After that was done, I irrigated and I placed another 7 mm allograft.  I did fashion the end plates with a ____________  bur.  That being done, I placed a 7 mm allograft.  I removed the distraction pins and had the weight removed from the  chin strap. I then with the assistance of Dr. Danielle Dess placed a 37 mm plate which I gave a slight amount of lordosis to using two screws at C4, one screw at C5 and two screws at C6.  X-ray taken after showed the plate and the plugs to be in good position.  I irrigated the wound once more, controlled some minor bleeding points with bipolar cautery.  I then closed the wound in layered fashion using Vicryl sutures and reapproximated the wound with Vicryl first in the layer of platysma, then the subcuticular layer.  Dermabond used for the dressing.  The patient was then extubated moving all extremities. DD:  03/20/01 TD:  03/21/01 Job: [id number]

## 2011-03-24 NOTE — Cardiovascular Report (Signed)
[place]. [facility]  Patient:    [name] [name], [name] [name]                           MRN: [id number] Proc. Date: 08/15/00 Attending:  Veneda Melter, M.D. [facility] CC:         Veneda Melter, M.D. [facility]  Lewayne Bunting, M.D.  Hadass[facility] Pais. Jeannetta Nap, M.D.   Cardiac Catheterization  PROCEDURES PERFORMED: 1.  Left heart catheterization. 2.  Left ventriculogram. 3.  Selective coronary angiography. 4.  Abdominal aortogram. 5.  Percutaneous transluminal coronary angioplasty and stenting of proximal     right coronary artery. 6.  Percutaneous transluminal angioplasty of right iliac artery. 7.  Perclose of right femoral artery.  DIAGNOSES: 1.  Two-vessel coronary artery disease. 2.  Normal left ventricular systolic function. 3.  Iatrogenic dissection right iliac artery.  HISTORY:  [name] [name] is a 65 year old white male with a known history of coronary artery disease, who suffered an inferior wall myocardial infarction on February 17, 2000.  At that time, he underwent emergency left heart catheterization showing severe narrowing in the proximal RCA and occlusion of the mid RCA with a long segment of disease.  He underwent stenting of the proximal RCA with placement of 4.0 x 12 mm stent in the mid section with a 4.0 x 31 mm stent.  The patient did well post procedure and in the interim has done well, although he has recently noted an increase in fatigue and mild chest pains.  He underwent stress imaging study showing a small infarction of the basilar inferior wall with moderate periinfarct ischemic.  He presents now for a left heart catheterization.  TECHNIQUE:  After informed consent was obtained, the patient was brought to the cardiac catheterization laboratory where both groins were sterilely prepped and draped.  One percent Lidocaine was used to infiltrate the right groin.  A 6-French sheath was placed in the right femoral artery using the modified Seldinger technique.  A 6-French JL-4  catheter was used to engage the left coronary artery and selective angiography was performed.  Attempts were made to pass the JR-4 catheter, however, significant resistance was encountered in the right iliac artery and manual injections of contrast showed severe dissection of the right iliac artery compromising the lumen by approximately 70%.  Using an angled guide, we were able to cross the lesion and a JR-4 catheter was advanced to the origin of the right coronary artery. Selective angiography was then performed using manual injections of contrast. Short exchanges were now performed for all catheter changes.  A 6-French pigtail was than advanced to the left ventricle and left ventriculogram was performed using power injections of contrast.  The pigtail catheter was brought back into the descending aorta and abdominal aortogram was performed using power injections of contrast.  INITIAL FINDINGS: 1.  Left main trunk:  Large caliber vessel with distal narrowing of 20%.  2.  Left anterior descending artery:  This begins as a large caliber vessel     and tapers proximally as it provides a large first diagonal branch.  There     is mild to moderate disease of 30% to 40% in the proximal LAD.  The mid     LAD after the takeoff of the diagonal branch has a high-grade narrowing     of 80%.  The distal LAD and the large diagonal branch have mild     irregularities.  3.  Left circumflex artery:  This is a large caliber vessel that provides two     marginal branches.  There is mild diffuse disease with narrowings of 30%     in the AV circumflex.  4.  Right coronary artery:  This is a dominant and large caliber vessel which     provides a posterior descending artery and two posteroventricular branches     in its terminal segment.  The right coronary artery has a stent in the     proximal segment as well as in the mid section with mild narrowings of     10%.  There is a focal narrowing of 70%  prior to the proximal stent with     a further narrowing of 70% in the proximal RCA between the two previously     placed stents.  The posterior descending artery has a narrowing of 50% in     the proximal segment.  LEFT VENTRICULOGRAM:  Normal end-systolic and end-diastolic dimensions. Overall left ventricular function is well preserved.  Ejection fraction is greater than 65%.  No mitral regurgitation.  LV pressure is 110/5, aortic pressure 110/64, LV EDP 22.  ABDOMINAL AORTOGRAM:  Abdominal aorta is of normal caliber with only mild atheromatous buildup.  The renal arteries are single and patent bilaterally. The iliac arteries are patent bilaterally with mild irregularities.  There is evidence of retrograde dissection of the right iliac artery.  INTERVENTIONAL PROCEDURE:  With these findings, we elected to proceed with percutaneous intervention of the right coronary artery.  The patient was given heparin, Plavix, and Integrilin on a weight-adjusted basis.  A 6-French JR-4 guide catheter was used to engage the right coronary artery.  A 0.014 inch Trooper Patriot wire was advanced into the distal RCA and a 4.0 x 14 mm radius stent was carefully positioned under cineangiography in the proximal RCA, between the two previously placed stents.  This was then deployed.  A 4.0 x 12 mm Quantum Ranger balloon was introduced and used to postdilate the stent.  Two inflations were performed at 14 atm x 45 seconds and for 30 seconds.  Repeat angiography showed excellent positioning of the stent and no residual narrowing.  Attention was then turned to the lesion proximal to the previously placed stent and a 4.0 x 9 mm NIR stent was carefully positioned in the proximal segment of the right coronary artery.  This was deployed at 14 atm x 30 seconds.  The stent delivery system was advanced slightly and the junction of the stents expanded at 14 atm x 30 seconds.  Repeat angiography  was performed after  the administration of intracoronary nitroglycerin and showed an excellent result with no residual stenosis and with TIMI-3 flow throughout the right coronary system.  Attention was then turned to the right iliac artery.  A wire was advanced into the descending aorta and a Perclose suture closure device was predeployed to the right femoral artery and the suture and sheath exchanged for an 8-French sheath.  An 8 x 10 Powerflex balloon was introduced and used to predilate the stenosis at 2 atm x 2 minutes.  Repeat angiography showed improved lumen of the vessel, however, there was severe residual stenosis and a 12 x 4 Powerflex balloon was introduced with two inflations performed at 4 atm and 6 atm for three minutes along the area of dissection with mild improvement in flow and some tacking of the distal sections of the flap.  Two additional inflations were performed in the proximal and distal  sections of the dissection at 6 atm for 5 minutes, with repeat angiography now showing excellent result with complete tacking of the dissection and only mild residual compromise of the lumen.  The 8-French sheath was removed and the Perclose suture closure device deployed to the right femoral artery.  Adequate hemostasis was achieved.  The patient tolerated the procedure well and was transferred to the floor.  FINAL RESULTS: 1.  Successful percutaneous transluminal coronary angioplasty and stenting of     the proximal right coronary artery with placement of 4.0 x 14 mm radius     stent preceded by a 4.0 x 9 mm NIR Elite stent. 2.  Six-vessel percutaneous transluminal angiography of the right iliac     artery with reduction of 70% iatrogenic dissection to less than 10%.  ASSESSMENT AND PLAN:  [name] [name] is a 66 year old gentleman who has progression of disease in the proximal right coronary artery at the site of previous intervention.  He has now been stabilized.  He has a residual narrowing in the mid  left anterior descending artery, which on recent stress imaging study did not suggest ischemia.  At this point, continued medical therapy will be pursued and consideration given towards repeating his stress imaging study in several months time to assess for progression of disease. DD:  08/15/00 TD:  08/15/00 Job: [id number]

## 2011-03-24 NOTE — Assessment & Plan Note (Signed)
[facility]                                 ON-CALL NOTE   IBRAHEM, VOLKMAN                           MRN:          [id number]  DATE:01/03/2009                            DOB:          Jul 02, 1946    Telephone comversation with Delford Field the spouse of [name] [name].   Patient of Dr. Ival Bible   Ms. Volker is calling stating her husband is having palpitations, feels  like his heart is flip-flopping.  Mr. Morelos has been under increased  stress with the recent death of his mother on 03/26/23.  I instructed her  to bring Mr. Formanek into the emergency room to get evaluated, this is  unclear as to what kind of irregular heart rhythm he is having.  He  denied any episodes of chest discomfort or shortness of breath,  presyncope or syncopal episodes.  Ms. Milke states that she would bring  her husband in.      Dorian Pod, ACNP  Electronically Signed      Marca Ancona, MD  Electronically Signed   MB/MedQ  DD: 01/03/2009  DT: 01/03/2009  Job #: [id number]

## 2011-03-24 NOTE — Discharge Summary (Signed)
[place]. [facility]  Patient:    [name], [name] [name]                         MRN: [id number] Adm. Date:  [id number] Disc. Date: [id number] Attending:  Waldo Laine Dictator:   Rutherford Guys, P.A. CC:         Lewayne Bunting, M.D.  Veneda Melter, M.D. [facility]  [facility]ass[facility] Pais. Jeannetta Nap, M.D.   Discharge Summary  DATE OF BIRTH:  1946/06/01  REFERRING PHYSICIANS:  Lewayne Bunting, M.D., Veneda Melter, M.D., and Hadassah Pais. Jeannetta Nap, M.D.  PRIMARY ADMISSION DIAGNOSES/DISCHARGE DIAGNOSES:  Occlusion of left anterior descending artery with failed angioplasty and Rotablator, emergent CABG.  SECONDARY DIAGNOSES: 1. Tobacco use. 2. Hyperlipidemia. 3. Status post anterior myocardial infarction in April 2001, with percutaneous    transluminal coronary angioplasty of the right coronary artery. 4. Status post percutaneous intervention of the right coronary artery in    October 2001, which is a repeat. 5. Status post myocardial infarction complicated with defibrillation and    dissection of right iliac artery in October 2001.  PROCEDURES: 1. Cardiac catheterization on October 23, 2000 by Dr. Chales Abrahams. 2. Emergent coronary artery bypass graft surgery x 3 with the LIMA to the LAD    and reverse saphenous vein graft to the diagonal branch and reverse    saphenous vein graft to the posterior descending branch.  HOSPITAL COURSE:  The patient was admitted [facility] as an outpatient for a cardiac catheterization scheduled for October 23, 2000, by Dr. Veneda Melter, but catheterization was unsuccessful to abrupt closure of the left anterior descending artery, and stat consult was called for Dr. Tyrone Sage.  It was determined at that time that an emergent coronary artery bypass graft surgery was required, and the patient was taken to the OR.  The patient tolerated the procedure well, and the patients postop recovery had been uneventful and was anticipated for discharge in the a.m.  CONDITION  ON DISCHARGE:  Improved and stable.  DISCHARGE MEDICATIONS: 1. Aspirin 325 mg q.d. 2. Folic acid 1 mg q.d. 3. Toprol XL 50 mg p.o. q.d. 4. Lasix 40 mg p.o. 1 tab q.d. 5. Potassium chloride 20 mEq 1 p.o. q.d. 6. Ultram 100 mg p.o. q.4-6h. p.r.n. pain. 7. Zantac 150 mg.  The patient has this medicine at home.  ACTIVITY:  The patient is instructed not to do any driving or lifting of more than 10 pounds.  DIET:  Low fat, low sodium diet.  DISCHARGE INSTRUCTIONS:  The patient was instructed that he may shower and clean wounds with mild soap and water and call the office if wound problems arise as noted on the fact sheet.  SPECIAL INSTRUCTIONS:  The patient was given the cardiac surgery fact sheet, and the patient has a follow-up appointment with his cardiologist, Dr. Mort Sawyers., who is Annette Stable, on November 09, 2000, at 9:15 a.m., and the patient will have a chest x-ray taken then, and the patient has an appointment with Dr. Tyrone Sage in three weeks.  The office is going to be calling the patient to set up this appointment, and he will bring his chest x-ray with him. DD:  10/27/00 TD:  10/29/00 Job: [id number]

## 2013-01-02 ENCOUNTER — Telehealth: Payer: Self-pay | Admitting: Internal Medicine

## 2013-01-02 NOTE — Telephone Encounter (Signed)
New problem   Pt is having sob and want to be seen by Dr Gala Romney. Pt want to know if he is still eligible to be seen at the heart clinic

## 2013-01-02 NOTE — Telephone Encounter (Signed)
Pt was not a pt of Dr Gala Romney, he saw Dr Clifton James in 2010

## 2013-09-16 ENCOUNTER — Encounter: Payer: Self-pay | Admitting: Cardiovascular Disease

## 2013-09-16 ENCOUNTER — Ambulatory Visit (INDEPENDENT_AMBULATORY_CARE_PROVIDER_SITE_OTHER): Payer: Medicare Other | Admitting: Cardiovascular Disease

## 2013-09-16 VITALS — BP 116/62 | HR 49 | Ht 66.0 in | Wt 222.0 lb

## 2013-09-16 DIAGNOSIS — I2510 Atherosclerotic heart disease of native coronary artery without angina pectoris: Secondary | ICD-10-CM

## 2013-09-16 DIAGNOSIS — E785 Hyperlipidemia, unspecified: Secondary | ICD-10-CM

## 2013-09-16 DIAGNOSIS — I10 Essential (primary) hypertension: Secondary | ICD-10-CM

## 2013-09-16 DIAGNOSIS — Z87891 Personal history of nicotine dependence: Secondary | ICD-10-CM

## 2013-09-16 DIAGNOSIS — F17201 Nicotine dependence, unspecified, in remission: Secondary | ICD-10-CM

## 2013-09-16 NOTE — Progress Notes (Signed)
History of Present Illness: [name] [name] is a 67 yo Caucasian male with past medical history significant for coronary artery disease status post 3-vessel coronary artery bypass grafting surgery in 2001, hypertension, diabetes mellitus and hyperlipidemia as well as remote tobacco abuse who returns today for cardiac follow up. I have not seen him since 2010 in the office. His last cath was in December 2001 at which time his LAD shut down during attempted rotablator atherectomy leading to 3V CABG. Stress test October 2010 with no ishcemia, normal LVEF. Echo October 2010 with normal LV size and function.   He is here today for follow up. He denies any chest discomfort, palpitations, dizziness, near syncope, syncope, orthopnea, PND, or lower extremity edema. He has gone back to work part-time   Primary Care Physician: Dr. Windle Guard  Last Lipid Profile: Followed in primary care.   Past Medical History  Diagnosis Date  . CAD (coronary artery disease)     s/p 3V CABG 2001  . HTN (hypertension)   . Hyperlipidemia   . Diabetes mellitus   . Tobacco abuse, in remission     Past Surgical History  Procedure Laterality Date  . Cholecystectomy    . Neck surgery    . Coronary artery bypass graft    . Pineal cyst removal    . Bone spur removal right shoulder      Current Outpatient Prescriptions  Medication Sig Dispense Refill  . aspirin 81 MG tablet Take 81 mg by mouth daily.      . CRESTOR 20 MG tablet Take 1 tablet by mouth daily.      . metFORMIN (GLUCOPHAGE) 1000 MG tablet Take 1,500 mg by mouth daily with breakfast.      . omeprazole (PRILOSEC OTC) 20 MG tablet Take 20 mg by mouth daily.       No current facility-administered medications for this visit.    No Known Allergies  History   Social History  . Marital Status: Married    Spouse Name: N/A    Number of Children: 4  . Years of Education: N/A   Occupational History  . Retired Naval architect    Social History Main  Topics  . Smoking status: Former Smoker -- 3.00 packs/day for 35 years    Types: Cigarettes    Quit date: 09/16/2000  . Smokeless tobacco: Not on file  . Alcohol Use: No  . Drug Use: No  . Sexual Activity: Not on file   Other Topics Concern  . Not on file   Social History Narrative  . No narrative on file    Family History  Problem Relation Age of Onset  . Heart attack Father 23  . Heart attack Paternal Grandfather 20  . Diabetes Mother   . Diabetes Father     Review of Systems:  As stated in the HPI and otherwise negative.   BP 116/62  Pulse 49  Ht 5\' 6"  (1.676 m)  Wt 222 lb (100.699 kg)  BMI 35.85 kg/m2  Physical Examination: General: Well developed, well nourished, NAD HEENT: OP clear, mucus membranes moist SKIN: warm, dry. No rashes. Neuro: No focal deficits Musculoskeletal: Muscle strength 5/5 all ext Psychiatric: Mood and affect normal Neck: No JVD, no carotid bruits, no thyromegaly, no lymphadenopathy. Lungs:Clear bilaterally, no wheezes, rhonci, crackles Cardiovascular: Regular rate and rhythm. No murmurs, gallops or rubs. Abdomen:Soft. Bowel sounds present. Non-tender.  Extremities: No lower extremity edema. Pulses are 2 + in the bilateral DP/PT.  EKG: Sinus brady, 49 bpm.   Cardiac cath December 2001:  1. Left main trunk: The left main trunk is a large caliber vessel with a  distal narrowing of 20%.  2. Left anterior descending: This is a large caliber vessel that provides  a diagonal branch in the mid section as well as several septal perforators  in the proximal and mid section. The LAD has a long eccentric narrowing  of 50% in the mid section prior to the diagonal branch and a focal  high-grade narrowing of 90% distally in the LAD immediately after the  takeoff of the diagonal branch. The distal LAD has mild diffuse disease  as of the diagonal branch.  3. Left circumflex artery: This is a large caliber vessel that provides a  medium caliber first  marginal branch in the proximal segment and a  larger second marginal branch distally. The AV circumflex has a narrowing  of 30% between the first and second marginal branch. The first marginal  branch has a narrowing of 60% in the proximal segment.  4. Right coronary artery: The right coronary artery is dominant. This is a  large caliber vessel that provides a posterior descending artery and a  small posterior ventricular branch in its terminal segment. There is an  ostial narrowing of 50% within the area of previous stenting. The  remainder of the stented segment in the proximal and mid section of the  vessel has mild disease of 20%. The ostium of the posterior descending  artery has a focal narrowing of 70%.  Echo October 2010: Left ventricle: The cavity size was normal. There was mild focal basal hypertrophy of the septum. Systolic function was normal. The estimated ejection fraction was 55%. There is hypokinesis of the inferior and posterior myocardium. Left ventricular diastolic function parameters were normal. - Aortic valve: Trivial regurgitation. - Left atrium: The atrium was mildly dilated. - Right atrium: The atrium was mildly dilated.  Stress myoview October 2010:  The patient exercised for eight minutes. The patient stopped due to fatigue and denied any chest pain. There were no significant ST-T wave changes, only occasional PVC's with rare couplets. Myoview was injected at peak exercise and myocardial perfusion imaging was performed after a brief delay.  QPS  Raw Data Images: Normal; no motion artifact; normal heart/lung ratio.  Stress Images: Decreased uptake at base of inferior wall.  Rest Images: Decreased uptake at base of inferior wall.  Subtraction (SDS): Very small fixed defect at base of inferior wall. Likely diaphragmatic attenuation. No ischemia.  Transient Ischemic Dilatation: 1.03 (Normal <1.22)  Lung/Heart Ratio: .36 (Normal <0.45)  Quantitative Gated Spect  Images  QGS EDV: 92 ml  QGS ESV: 35 ml  QGS EF: 62 %  QGS cine images: Septal HK due to CABG.  Findings  Low risk nuclear study  Evidence for inferior infarct  Overall Impression  Exercise Capacity: Fair exercise capacity.  Clinical Symptoms: There is dyspnea.  ECG Impression: No significant ST segment change suggestive of ischemia.  Overall Impression: Low risk stress nuclear study.  Overall Impression Comments: Very small fixed defect at base of inferior wall. Likely diaphragmatic attenuation. No ischemia.  Assessment and Plan:   1. CAD s/p CABG: Stable. Continue ASA and statin. BP is normal. Lipids followed in primary care. No beta blocker secondary to bradycardia. Will check echo to reassess LV function.   2. HTN: BP is normal.  3. Tobacco abuse, in remission: He stopped smoking in 2001.   4.  Hyperlipidemia:  Followed in primary care.   5. Diabetes mellitus: Continue current therapy.

## 2013-09-16 NOTE — Patient Instructions (Signed)

## 2013-11-06 DIAGNOSIS — I517 Cardiomegaly: Secondary | ICD-10-CM

## 2013-11-06 HISTORY — DX: Cardiomegaly: I51.7

## 2013-11-17 ENCOUNTER — Encounter: Payer: Self-pay | Admitting: Cardiology

## 2013-11-17 ENCOUNTER — Ambulatory Visit (HOSPITAL_COMMUNITY): Payer: Medicare Other | Attending: Cardiology | Admitting: Radiology

## 2013-11-17 DIAGNOSIS — E119 Type 2 diabetes mellitus without complications: Secondary | ICD-10-CM | POA: Insufficient documentation

## 2013-11-17 DIAGNOSIS — I10 Essential (primary) hypertension: Secondary | ICD-10-CM | POA: Insufficient documentation

## 2013-11-17 DIAGNOSIS — I2510 Atherosclerotic heart disease of native coronary artery without angina pectoris: Secondary | ICD-10-CM | POA: Insufficient documentation

## 2013-11-17 DIAGNOSIS — F17200 Nicotine dependence, unspecified, uncomplicated: Secondary | ICD-10-CM | POA: Insufficient documentation

## 2013-11-17 DIAGNOSIS — E785 Hyperlipidemia, unspecified: Secondary | ICD-10-CM | POA: Insufficient documentation

## 2013-11-17 DIAGNOSIS — Z6835 Body mass index (BMI) 35.0-35.9, adult: Secondary | ICD-10-CM | POA: Insufficient documentation

## 2013-11-17 NOTE — Progress Notes (Signed)
Echocardiogram performed.  

## 2013-11-21 ENCOUNTER — Telehealth: Payer: Self-pay | Admitting: *Deleted

## 2013-11-21 NOTE — Telephone Encounter (Signed)
Message copied by Earvin Hansen on Fri Nov 21, 2013  5:43 PM ------      Message from: Lauree Chandler D      Created: Wed Nov 19, 2013  4:42 PM       Echo is overall normal. cdm ------

## 2013-11-21 NOTE — Telephone Encounter (Signed)
Advised patient

## 2014-11-16 ENCOUNTER — Ambulatory Visit: Payer: Medicare Other | Admitting: Cardiovascular Disease

## 2014-11-27 ENCOUNTER — Ambulatory Visit: Payer: Self-pay | Admitting: Cardiovascular Disease

## 2014-12-03 ENCOUNTER — Encounter: Payer: Self-pay | Admitting: Cardiovascular Disease

## 2014-12-03 ENCOUNTER — Ambulatory Visit (INDEPENDENT_AMBULATORY_CARE_PROVIDER_SITE_OTHER): Payer: Medicare Other | Admitting: Cardiovascular Disease

## 2014-12-03 VITALS — BP 128/78 | HR 57 | Ht 66.0 in | Wt 214.4 lb

## 2014-12-03 DIAGNOSIS — R001 Bradycardia, unspecified: Secondary | ICD-10-CM

## 2014-12-03 DIAGNOSIS — I2510 Atherosclerotic heart disease of native coronary artery without angina pectoris: Secondary | ICD-10-CM

## 2014-12-03 DIAGNOSIS — F17201 Nicotine dependence, unspecified, in remission: Secondary | ICD-10-CM

## 2014-12-03 DIAGNOSIS — E785 Hyperlipidemia, unspecified: Secondary | ICD-10-CM

## 2014-12-03 DIAGNOSIS — I10 Essential (primary) hypertension: Secondary | ICD-10-CM

## 2014-12-03 NOTE — Progress Notes (Signed)
History of Present Illness: [name] [name] is a 69 yo Caucasian male with past medical history significant for coronary artery disease status post 3-vessel coronary artery bypass grafting surgery in 2001, hypertension, diabetes mellitus and hyperlipidemia as well as remote tobacco abuse who returns today for cardiac follow up. His last cath was in December 2001 at which time his LAD shut down during attempted rotablator atherectomy leading to 3V CABG. Stress test October 2010 with no ischemia, normal LVEF. Echo October 2010 with normal LV size and function.   He is here today for follow up. He denies any chest discomfort, palpitations, dizziness, near syncope, syncope, orthopnea, PND, or lower extremity edema. He does have worsened fatigue lately. No energy. His wife reports that he has not been feeling well at home.   Primary Care Physician: Dr. Claris Gower  Last Lipid Profile: Followed in primary care.   Past Medical History  Diagnosis Date  . CAD (coronary artery disease)     s/p 3V CABG 2001  . HTN (hypertension)   . Hyperlipidemia   . Diabetes mellitus   . Tobacco abuse, in remission     Past Surgical History  Procedure Laterality Date  . Cholecystectomy    . Neck surgery    . Coronary artery bypass graft    . Pineal cyst removal    . Bone spur removal right shoulder      Current Outpatient Prescriptions  Medication Sig Dispense Refill  . aspirin 81 MG tablet Take 81 mg by mouth daily.    . CRESTOR 20 MG tablet Take 1 tablet by mouth daily.    Marland Kitchen donepezil (ARICEPT) 5 MG tablet   1  . levothyroxine (SYNTHROID, LEVOTHROID) 88 MCG tablet   0  . metFORMIN (GLUCOPHAGE) 1000 MG tablet Take 1,000 mg by mouth 2 (two) times daily with a meal.     . metoprolol (LOPRESSOR) 50 MG tablet   0  . omeprazole (PRILOSEC OTC) 20 MG tablet Take 20 mg by mouth daily.    . ONE TOUCH ULTRA TEST test strip daily. for testing  0  . vitamin B-12 (CYANOCOBALAMIN) 1000 MCG tablet Take 1,000 mcg by  mouth daily.     No current facility-administered medications for this visit.    No Known Allergies  History   Social History  . Marital Status: Married    Spouse Name: N/A    Number of Children: 4  . Years of Education: N/A   Occupational History  . Retired Administrator    Social History Main Topics  . Smoking status: Former Smoker -- 3.00 packs/day for 35 years    Types: Cigarettes    Quit date: 09/16/2000  . Smokeless tobacco: Not on file  . Alcohol Use: No  . Drug Use: No  . Sexual Activity: Not on file   Other Topics Concern  . Not on file   Social History Narrative    Family History  Problem Relation Age of Onset  . Heart attack Father 30  . Heart attack Paternal Grandfather 74  . Diabetes Mother   . Diabetes Father     Review of Systems:  As stated in the HPI and otherwise negative.   BP 128/78 mmHg  Pulse 57  Ht 5\' 6"  (1.676 m)  Wt 214 lb 6.4 oz (97.251 kg)  BMI 34.62 kg/m2  SpO2 96%  Physical Examination: General: Well developed, well nourished, NAD HEENT: OP clear, mucus membranes moist SKIN: warm, dry. No rashes.  Neuro: No focal deficits Musculoskeletal: Muscle strength 5/5 all ext Psychiatric: Mood and affect normal Neck: No JVD, no carotid bruits, no thyromegaly, no lymphadenopathy. Lungs:Clear bilaterally, no wheezes, rhonci, crackles Cardiovascular: Loletha Grayer, Regular rhythm. No murmurs, gallops or rubs. Abdomen:Soft. Bowel sounds present. Non-tender.  Extremities: No lower extremity edema. Pulses are 2 + in the bilateral DP/PT.  EKG: Sinus brady, 49 bpm.   Cardiac cath December 2001:  1. Left main trunk: The left main trunk is a large caliber vessel with a  distal narrowing of 20%.  2. Left anterior descending: This is a large caliber vessel that provides  a diagonal branch in the mid section as well as several septal perforators  in the proximal and mid section. The LAD has a long eccentric narrowing  of 50% in the mid section prior  to the diagonal branch and a focal  high-grade narrowing of 90% distally in the LAD immediately after the  takeoff of the diagonal branch. The distal LAD has mild diffuse disease  as of the diagonal branch.  3. Left circumflex artery: This is a large caliber vessel that provides a  medium caliber first marginal branch in the proximal segment and a  larger second marginal branch distally. The AV circumflex has a narrowing  of 30% between the first and second marginal branch. The first marginal  branch has a narrowing of 60% in the proximal segment.  4. Right coronary artery: The right coronary artery is dominant. This is a  large caliber vessel that provides a posterior descending artery and a  small posterior ventricular branch in its terminal segment. There is an  ostial narrowing of 50% within the area of previous stenting. The  remainder of the stented segment in the proximal and mid section of the  vessel has mild disease of 20%. The ostium of the posterior descending  artery has a focal narrowing of 70%.  Echo 11/17/13: Left ventricle: The cavity size was normal. Wall thickness was increased in a pattern of mild LVH. There was moderate focal basal hypertrophy. Systolic function was normal. The estimated ejection fraction was in the range of 55% to 60%. Wall motion was normal; there were no regional wall motion abnormalities. - Left atrium: The atrium was mildly dilated. - Right ventricle: The cavity size was mildly dilated. Wall thickness was normal.  Stress myoview October 2010:  The patient exercised for eight minutes. The patient stopped due to fatigue and denied any chest pain. There were no significant ST-T wave changes, only occasional PVC's with rare couplets. Myoview was injected at peak exercise and myocardial perfusion imaging was performed after a brief delay.  QPS  Raw Data Images: Normal; no motion artifact; normal heart/lung ratio.  Stress Images:  Decreased uptake at base of inferior wall.  Rest Images: Decreased uptake at base of inferior wall.  Subtraction (SDS): Very small fixed defect at base of inferior wall. Likely diaphragmatic attenuation. No ischemia.  Transient Ischemic Dilatation: 1.03 (Normal <1.22)  Lung/Heart Ratio: .36 (Normal <0.45)  Quantitative Gated Spect Images  QGS EDV: 92 ml  QGS ESV: 35 ml  QGS EF: 62 %  QGS cine images: Septal HK due to CABG.  Findings  Low risk nuclear study  Evidence for inferior infarct  Overall Impression  Exercise Capacity: Fair exercise capacity.  Clinical Symptoms: There is dyspnea.  ECG Impression: No significant ST segment change suggestive of ischemia.  Overall Impression: Low risk stress nuclear study.  Overall Impression Comments: Very small fixed defect  at base of inferior wall. Likely diaphragmatic attenuation. No ischemia.  EKG: Sinus brady, rate 57 bpm. Ventricular bigeminy.   Assessment and Plan:   1. CAD s/p CABG: Reports severe fatigue over last several weeks. No energy to do anything. Will arrange Lexiscan stress myoview to exclude ischemia. Continue ASA and statin. BP is normal. He has been on a low dose beta blocker for PVCs/bigeminy. No dizziness. May need to lower beta blocker dose with bradycardia but given PVCs, this will be tricky.    2. HTN: BP is normal.  3. Tobacco abuse, in remission: He stopped smoking in 2001.   4. Hyperlipidemia:  Followed in primary care.   5. Sinus brady with PVCs/ventricular bigeminy: Continue beta blocker for now with PVCs but if he becomes more bradycardic, will need to stop beta blocker and refer to EP.

## 2014-12-03 NOTE — Patient Instructions (Signed)
You have a follow up appt scheduled with Dr. Angelena Form on December 28, 2014 at 2:30  Your physician has requested that you have a lexiscan myoview. For further information please visit [email or web address]. Please follow instruction sheet, as given.

## 2014-12-04 ENCOUNTER — Encounter: Payer: Self-pay | Admitting: Cardiology

## 2014-12-04 ENCOUNTER — Encounter: Payer: Self-pay | Admitting: Cardiovascular Disease

## 2014-12-08 ENCOUNTER — Ambulatory Visit (HOSPITAL_COMMUNITY): Payer: Medicare Other | Attending: Cardiology | Admitting: Radiology

## 2014-12-08 DIAGNOSIS — E785 Hyperlipidemia, unspecified: Secondary | ICD-10-CM | POA: Diagnosis not present

## 2014-12-08 DIAGNOSIS — R0600 Dyspnea, unspecified: Secondary | ICD-10-CM

## 2014-12-08 DIAGNOSIS — R0609 Other forms of dyspnea: Secondary | ICD-10-CM | POA: Diagnosis present

## 2014-12-08 DIAGNOSIS — I10 Essential (primary) hypertension: Secondary | ICD-10-CM | POA: Insufficient documentation

## 2014-12-08 DIAGNOSIS — E119 Type 2 diabetes mellitus without complications: Secondary | ICD-10-CM | POA: Insufficient documentation

## 2014-12-08 DIAGNOSIS — I2510 Atherosclerotic heart disease of native coronary artery without angina pectoris: Secondary | ICD-10-CM

## 2014-12-08 MED ORDER — REGADENOSON 0.4 MG/5ML IV SOLN
0.4000 mg | Freq: Once | INTRAVENOUS | Status: AC
  Administered 2014-12-08: 0.4 mg via INTRAVENOUS

## 2014-12-08 MED ORDER — TECHNETIUM TC 99M SESTAMIBI GENERIC - CARDIOLITE
10.0000 | Freq: Once | INTRAVENOUS | Status: AC | PRN
  Administered 2014-12-08: 10 via INTRAVENOUS

## 2014-12-08 MED ORDER — TECHNETIUM TC 99M SESTAMIBI GENERIC - CARDIOLITE
30.0000 | Freq: Once | INTRAVENOUS | Status: AC | PRN
  Administered 2014-12-08: 30 via INTRAVENOUS

## 2014-12-08 NOTE — Progress Notes (Signed)
  [facility] [address] [place], [place] [address] [phone]    Cardiology Nuclear Med Study  LEONA PRESSLY is a 69 y.o. male     MRN : [id number]     DOB: Aug 12, 1946  Procedure Date: 12/08/2014  Nuclear Med Background Indication for Stress Test:  Evaluation for Ischemia and Follow up CAD History:  History of CAD;'10 MPI:EF=62% and inferior scar Cardiac Risk Factors: Hypertension, Lipids and NIDDM  Symptoms:  DOE   Nuclear Pre-Procedure Caffeine/Decaff Intake:  8:00pm NPO After: 8:00pm   Lungs:  clear O2 Sat: 97% on room air. IV 0.9% NS with Angio Cath:  22g  IV Site: R Hand  IV Started by:  Matilde Haymaker, RN  Chest Size (in):  46 Cup Size: n/a  Height: 5\' 6"  (1.676 m)  Weight:  214 lb (97.07 kg)  BMI:  Body mass index is 34.56 kg/(m^2). Tech Comments:  No Lopressor x 14 hrs    Nuclear Med Study 1 or 2 day study: 1 day  Stress Test Type:  Lexiscan  Reading MD: n/a  Order Authorizing Provider:  Gennette Pac  Resting Radionuclide: Technetium 36m Sestamibi  Resting Radionuclide Dose: 11.0 mCi   Stress Radionuclide:  Technetium 31m Sestamibi  Stress Radionuclide Dose: 33.0 mCi           Stress Protocol Rest HR: 54 Stress HR: 82  Rest BP: 132/91 Stress BP: 133/87  Exercise Time (min): n/a METS: n/a   Predicted Max HR: 153 bpm % Max HR: 53.59 bpm Rate Pressure Product: 11234   Dose of Adenosine (mg):  n/a Dose of Lexiscan: 0.4 mg  Dose of Atropine (mg): n/a Dose of Dobutamine: n/a mcg/kg/min (at max HR)  Stress Test Technologist: Matilde Haymaker, RN  Nuclear Technologist:  Earl Many, CNMT     Rest Procedure:  Myocardial perfusion imaging was performed at rest 45 minutes following the intravenous administration of Technetium 16m Sestamibi. Rest ECG: NSR - Normal EKG and PVC's  Stress Procedure:  The patient received IV Lexiscan 0.4 mg over 15-seconds.  Technetium 86m Sestamibi injected at 30-seconds.   Quantitative spect images were obtained after a 45 minute delay. Stress ECG: There are scattered PVCs.  QPS Raw Data Images:  Mild diaphragmatic attenuation.  Normal left ventricular size. Stress Images:  Fixed apical and apicoseptal defect, mild inferior attenuation Rest Images:  Fixed apical defect, mild inferior attenuation Subtraction (SDS):  2 Transient Ischemic Dilatation (Normal <1.22):  1.12 Lung/Heart Ratio (Normal <0.45):  0.33  Quantitative Gated Spect Images QGS EDV:  NA ml QGS ESV:  NA ml  Impression Exercise Capacity:  Lexiscan with no exercise. BP Response:  Normal blood pressure response. Clinical Symptoms:  No significant symptoms noted. ECG Impression:  There are scattered PVCs. Comparison with Prior Nuclear Study: Prior study shows inferior scar  Overall Impression:  Low risk stress nuclear study with moderate intensity, small, mostly fixed apical and apical septal defects. Mild diaphragmatic attenuation artifact in the infrerior base, no significant reversible ischemia.  LV Ejection Fraction: Study not gated.  LV Wall Motion:  N/A   Pixie Casino, MD, [facility] Board Certified in Nuclear Cardiology Attending Cardiologist [facility]

## 2014-12-14 ENCOUNTER — Ambulatory Visit: Payer: Self-pay | Admitting: Physician Assistant

## 2014-12-28 ENCOUNTER — Encounter: Payer: Self-pay | Admitting: Cardiovascular Disease

## 2014-12-28 ENCOUNTER — Ambulatory Visit (INDEPENDENT_AMBULATORY_CARE_PROVIDER_SITE_OTHER): Payer: Medicare Other | Admitting: Cardiovascular Disease

## 2014-12-28 VITALS — BP 124/82 | HR 46 | Ht 65.5 in | Wt 217.6 lb

## 2014-12-28 DIAGNOSIS — I2510 Atherosclerotic heart disease of native coronary artery without angina pectoris: Secondary | ICD-10-CM

## 2014-12-28 DIAGNOSIS — I10 Essential (primary) hypertension: Secondary | ICD-10-CM

## 2014-12-28 DIAGNOSIS — E785 Hyperlipidemia, unspecified: Secondary | ICD-10-CM

## 2014-12-28 DIAGNOSIS — I498 Other specified cardiac arrhythmias: Secondary | ICD-10-CM

## 2014-12-28 DIAGNOSIS — I499 Cardiac arrhythmia, unspecified: Secondary | ICD-10-CM

## 2014-12-28 NOTE — Progress Notes (Signed)
History of Present Illness: [name] [name] is a 69 yo Caucasian male with past medical history significant for coronary artery disease status post 3-vessel coronary artery bypass grafting surgery in 2001, hypertension, diabetes mellitus and hyperlipidemia as well as remote tobacco abuse who returns today for cardiac follow up. His last cath was in December 2001 at which time his LAD shut down during attempted rotablator atherectomy leading to 3V CABG. Stress test October 2010 with no ischemia, normal LVEF. Echo October 2010 with normal LV size and function. I saw him December 03, 2014 and he c/o worsened fatigue. Stress myoview 12/08/14 with no evidence of ischemia.   He is here today for follow up. He denies any chest discomfort, palpitations, dizziness, near syncope, syncope, orthopnea, PND, or lower extremity edema. He does have worsened fatigue lately. No energy. His wife reports that he has not been feeling well at home.   Primary Care Physician: Dr. Claris Gower  Last Lipid Profile: Followed in primary care.   Past Medical History  Diagnosis Date  . CAD (coronary artery disease)     s/p 3V CABG 2001  . HTN (hypertension)   . Hyperlipidemia   . Diabetes mellitus   . Tobacco abuse, in remission     Past Surgical History  Procedure Laterality Date  . Cholecystectomy    . Neck surgery    . Coronary artery bypass graft    . Pineal cyst removal    . Bone spur removal right shoulder      Current Outpatient Prescriptions  Medication Sig Dispense Refill  . aspirin 81 MG tablet Take 81 mg by mouth daily.    . CRESTOR 20 MG tablet Take 1 tablet by mouth daily.    Marland Kitchen donepezil (ARICEPT) 5 MG tablet Take 5 mg by mouth at bedtime.   1  . levothyroxine (SYNTHROID, LEVOTHROID) 88 MCG tablet Take 88 mcg by mouth daily before breakfast.   0  . metFORMIN (GLUCOPHAGE) 1000 MG tablet Take 1,000 mg by mouth 2 (two) times daily with a meal.     . metoprolol (LOPRESSOR) 50 MG tablet Take 25 mg by  mouth 2 (two) times daily.   0  . omeprazole (PRILOSEC OTC) 20 MG tablet Take 20 mg by mouth daily.    . ONE TOUCH ULTRA TEST test strip daily. for testing  0  . vitamin B-12 (CYANOCOBALAMIN) 1000 MCG tablet Take 1,000 mcg by mouth daily.     No current facility-administered medications for this visit.    No Known Allergies  History   Social History  . Marital Status: Married    Spouse Name: N/A  . Number of Children: 4  . Years of Education: N/A   Occupational History  . Retired Administrator    Social History Main Topics  . Smoking status: Former Smoker -- 3.00 packs/day for 35 years    Types: Cigarettes    Quit date: 09/16/2000  . Smokeless tobacco: Not on file  . Alcohol Use: No  . Drug Use: No  . Sexual Activity: Not on file   Other Topics Concern  . Not on file   Social History Narrative    Family History  Problem Relation Age of Onset  . Heart attack Father 92  . Heart attack Paternal Grandfather 74  . Diabetes Mother   . Diabetes Father     Review of Systems:  As stated in the HPI and otherwise negative.   BP 124/82 mmHg  Pulse  46  Ht 5' 5.5" (1.664 m)  Wt 217 lb 9.6 oz (98.703 kg)  BMI 35.65 kg/m2  SpO2 97%  Physical Examination: General: Well developed, well nourished, NAD HEENT: OP clear, mucus membranes moist SKIN: warm, dry. No rashes. Neuro: No focal deficits Musculoskeletal: Muscle strength 5/5 all ext Psychiatric: Mood and affect normal Neck: No JVD, no carotid bruits, no thyromegaly, no lymphadenopathy. Lungs:Clear bilaterally, no wheezes, rhonci, crackles Cardiovascular: Loletha Grayer, Regular rhythm. No murmurs, gallops or rubs. Abdomen:Soft. Bowel sounds present. Non-tender.  Extremities: No lower extremity edema. Pulses are 2 + in the bilateral DP/PT.  EKG: Sinus brady, 54 bpm.   Cardiac cath December 2001:  1. Left main trunk: The left main trunk is a large caliber vessel with a  distal narrowing of 20%.  2. Left anterior descending:  This is a large caliber vessel that provides  a diagonal branch in the mid section as well as several septal perforators  in the proximal and mid section. The LAD has a long eccentric narrowing  of 50% in the mid section prior to the diagonal branch and a focal  high-grade narrowing of 90% distally in the LAD immediately after the  takeoff of the diagonal branch. The distal LAD has mild diffuse disease  as of the diagonal branch.  3. Left circumflex artery: This is a large caliber vessel that provides a  medium caliber first marginal branch in the proximal segment and a  larger second marginal branch distally. The AV circumflex has a narrowing  of 30% between the first and second marginal branch. The first marginal  branch has a narrowing of 60% in the proximal segment.  4. Right coronary artery: The right coronary artery is dominant. This is a  large caliber vessel that provides a posterior descending artery and a  small posterior ventricular branch in its terminal segment. There is an  ostial narrowing of 50% within the area of previous stenting. The  remainder of the stented segment in the proximal and mid section of the  vessel has mild disease of 20%. The ostium of the posterior descending  artery has a focal narrowing of 70%.  Echo 11/17/13: Left ventricle: The cavity size was normal. Wall thickness was increased in a pattern of mild LVH. There was moderate focal basal hypertrophy. Systolic function was normal. The estimated ejection fraction was in the range of 55% to 60%. Wall motion was normal; there were no regional wall motion abnormalities. - Left atrium: The atrium was mildly dilated. - Right ventricle: The cavity size was mildly dilated. Wall thickness was normal.  Stress myoview 12/08/14: Stress Procedure: The patient received IV Lexiscan 0.4 mg over 15-seconds. Technetium 49m Sestamibi injected at 30-seconds. Quantitative spect images were obtained after a  45 minute delay. Stress ECG: There are scattered PVCs.  QPS Raw Data Images: Mild diaphragmatic attenuation. Normal left ventricular size. Stress Images: Fixed apical and apicoseptal defect, mild inferior attenuation Rest Images: Fixed apical defect, mild inferior attenuation Subtraction (SDS): 2 Transient Ischemic Dilatation (Normal <1.22): 1.12 Lung/Heart Ratio (Normal <0.45): 0.33  Quantitative Gated Spect Images QGS EDV: NA ml QGS ESV: NA ml  Impression Exercise Capacity: Lexiscan with no exercise. BP Response: Normal blood pressure response. Clinical Symptoms: No significant symptoms noted. ECG Impression: There are scattered PVCs. Comparison with Prior Nuclear Study: Prior study shows inferior scar  Overall Impression: Low risk stress nuclear study with moderate intensity, small, mostly fixed apical and apical septal defects. Mild diaphragmatic attenuation artifact in the infrerior  base, no significant reversible ischemia.  LV Ejection Fraction: Study not gated. LV Wall Motion: N/A   EKG: Sinus brady, rate 54 bpm. PVCs in pattern of bigeminy.   Assessment and Plan:   1. CAD s/p CABG: Reports continued severe fatigue over last several weeks. No energy to do anything. Stress test without ischemia. Continue ASA and statin. BP is normal. He has been on a low dose beta blocker for PVCs/bigeminy. No dizziness. May need to lower beta blocker dose with bradycardia but given PVCs, this will be tricky. Will refer to EP. (see below)  2. HTN: BP is normal.  3. Tobacco abuse, in remission: He stopped smoking in 2001.   4. Hyperlipidemia:  Followed in primary care.   5. Sinus brady with PVCs/ventricular bigeminy: His fatigue may be related to his PVCs, bigeminy and bradycardia. For now, will continue beta blocker for control of PVCs but I cannot titrate given bradycardia. I think he is symptomatic due to his PVCs. Will refer to see EP to discuss PVC ablation vs pacemaker  placement which will allow [place] to titrate his beta blocker vs alteration of medical therapy.

## 2014-12-28 NOTE — Patient Instructions (Signed)
Your physician recommends that you schedule a follow-up appointment in:  3-4 months with Dr. Angelena Form  You have been referred to  Dr. Lovena Le or Dr. Rayann Heman.  Please schedule patient for new patient appt

## 2015-01-20 ENCOUNTER — Encounter: Payer: Self-pay | Admitting: *Deleted

## 2015-01-20 ENCOUNTER — Ambulatory Visit (INDEPENDENT_AMBULATORY_CARE_PROVIDER_SITE_OTHER): Payer: Medicare Other | Admitting: Internal Medicine

## 2015-01-20 ENCOUNTER — Encounter: Payer: Self-pay | Admitting: Internal Medicine

## 2015-01-20 VITALS — BP 122/74 | HR 58 | Ht 65.5 in | Wt 216.6 lb

## 2015-01-20 DIAGNOSIS — Z01812 Encounter for preprocedural laboratory examination: Secondary | ICD-10-CM

## 2015-01-20 DIAGNOSIS — I10 Essential (primary) hypertension: Secondary | ICD-10-CM

## 2015-01-20 DIAGNOSIS — R001 Bradycardia, unspecified: Secondary | ICD-10-CM

## 2015-01-20 DIAGNOSIS — I2510 Atherosclerotic heart disease of native coronary artery without angina pectoris: Secondary | ICD-10-CM | POA: Diagnosis not present

## 2015-01-20 DIAGNOSIS — I493 Ventricular premature depolarization: Secondary | ICD-10-CM

## 2015-01-20 NOTE — Assessment & Plan Note (Signed)
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 We have discussed the treatment options in detail. He also has sinus node dysfunction. This and the combination of ischemic heart disease limits antiarrhythmic drug therapy. I've discussed the treatment options in detail. He could undergo watchful waiting, but he would likely develop a cardiomyopathy and worsening fatigue if we allow his PVCs to continue. A second option would be to give him amiodarone, but he would almost certainly require permanent pacemaker insertion. A third option and likely the best is to attempt catheter ablation and if successful no other therapy, but if unsuccessful revert to the second option. The risk and benefits goals and expectations of catheter ablation have been discussed in detail with the patient. He wishes to proceed. We plan to up pain electrolyte atomic mapping to help guide our ablation.

## 2015-01-20 NOTE — Progress Notes (Signed)
HPI Mr. Czaplicki returns today for followup. He is a very pleasant 69 year old man with a long-standing history of coronary artery disease, status post bypass surgery in 2001. He has preserved left ventricular systolic function. The patient has had mostly fatigue and weakness. He has had a slow resting pulse, and on 3 subsequent ECGs, over the past 3 months, he has had sinus rhythm with ventricular bigeminy. The PVCs are a right bundle QRS morphology. The axis is superior and leftward. The patient does not have much in the way of palpitations, and has never had syncope. The patient struggles walking up a grade, but does okay on flat ground. He continues to work. No Known Allergies   Current Outpatient Prescriptions  Medication Sig Dispense Refill  . aspirin 81 MG tablet Take 81 mg by mouth daily.    . CRESTOR 20 MG tablet Take 1 tablet by mouth daily.    Marland Kitchen donepezil (ARICEPT) 5 MG tablet Take 5 mg by mouth at bedtime.   1  . levothyroxine (SYNTHROID, LEVOTHROID) 88 MCG tablet Take 88 mcg by mouth daily before breakfast.   0  . metFORMIN (GLUCOPHAGE) 1000 MG tablet Take 1,000 mg by mouth 2 (two) times daily with a meal.     . metoprolol (LOPRESSOR) 50 MG tablet Take 25 mg by mouth 2 (two) times daily.   0  . omeprazole (PRILOSEC OTC) 20 MG tablet Take 20 mg by mouth daily.    . ONE TOUCH ULTRA TEST test strip daily. for testing  0  . vitamin B-12 (CYANOCOBALAMIN) 1000 MCG tablet Take 1,000 mcg by mouth daily.     No current facility-administered medications for this visit.     Past Medical History  Diagnosis Date  . CAD (coronary artery disease)     s/p 3V CABG 2001  . HTN (hypertension)   . Hyperlipidemia   . Diabetes mellitus   . Tobacco abuse, in remission     ROS:   All systems reviewed and negative except as noted in the HPI.   Past Surgical History  Procedure Laterality Date  . Cholecystectomy    . Neck surgery    . Coronary artery bypass graft    . Pineal cyst  removal    . Bone spur removal right shoulder       Family History  Problem Relation Age of Onset  . Heart attack Father 49  . Diabetes Father   . Heart attack Paternal Grandfather 74  . Diabetes Mother      History   Social History  . Marital Status: Married    Spouse Name: N/A  . Number of Children: 4  . Years of Education: N/A   Occupational History  . Retired Administrator    Social History Main Topics  . Smoking status: Former Smoker -- 3.00 packs/day for 35 years    Types: Cigarettes    Quit date: 09/16/2000  . Smokeless tobacco: Not on file  . Alcohol Use: No  . Drug Use: No  . Sexual Activity: Not on file   Other Topics Concern  . Not on file   Social History Narrative     BP 122/74 mmHg  Pulse 58  Ht 5' 5.5" (1.664 m)  Wt 216 lb 9.6 oz (98.249 kg)  BMI 35.48 kg/m2  Physical Exam:  Well appearing 68 year old man, NAD HEENT: Unremarkable Neck:  No JVD, no thyromegally Back:  No CVA tenderness Lungs:  Clear with no wheezes, rales,  or rhonchi. HEART:  IRegular rate rhythm, no murmurs, no rubs, no clicks Abd:  soft, positive bowel sounds, no organomegally, no rebound, no guarding Ext:  2 plus pulses, no edema, no cyanosis, no clubbing Skin:  No rashes no nodules Neuro:  CN II through XII intact, motor grossly intact  EKG - normal sinus rhythm with frequent PVCs in a bigeminal fashion.   Assess/Plan:

## 2015-01-20 NOTE — Patient Instructions (Signed)
Your physician recommends that you continue on your current medications as directed. Please refer to the Current Medication list given to you today.  Your physician has recommended that you have an PVC ablation. Catheter ablation is a medical procedure used to treat some cardiac arrhythmias (irregular heartbeats). During catheter ablation, a long, thin, flexible tube is put into a blood vessel in your groin (upper thigh), or neck. This tube is called an ablation catheter. It is then guided to your heart through the blood vessel. Radio frequency waves destroy small areas of heart tissue where abnormal heartbeats may cause an arrhythmia to start. Please see the instruction sheet given to you today.  You may eat breakfast before 7am the morning of your procedure.

## 2015-01-20 NOTE — Assessment & Plan Note (Signed)
His blood pressure is well controlled. His resting pulse rate is 29 bpm.

## 2015-01-20 NOTE — Assessment & Plan Note (Signed)
He denies anginal symptoms. Additional evaluation will be scheduled by his primary cardiologist.

## 2015-01-21 ENCOUNTER — Other Ambulatory Visit: Payer: Self-pay | Admitting: *Deleted

## 2015-02-11 ENCOUNTER — Other Ambulatory Visit (INDEPENDENT_AMBULATORY_CARE_PROVIDER_SITE_OTHER): Payer: Medicare Other | Admitting: *Deleted

## 2015-02-11 DIAGNOSIS — Z01812 Encounter for preprocedural laboratory examination: Secondary | ICD-10-CM

## 2015-02-11 DIAGNOSIS — I493 Ventricular premature depolarization: Secondary | ICD-10-CM

## 2015-02-11 LAB — CBC WITH DIFFERENTIAL/PLATELET
BASOS PCT: 0.5 % (ref 0.0–3.0)
Basophils Absolute: 0 10*3/uL (ref 0.0–0.1)
Eosinophils Absolute: 0.1 10*3/uL (ref 0.0–0.7)
Eosinophils Relative: 1.7 % (ref 0.0–5.0)
HEMATOCRIT: 43.4 % (ref 39.0–52.0)
HEMOGLOBIN: 14.7 g/dL (ref 13.0–17.0)
LYMPHS ABS: 2 10*3/uL (ref 0.7–4.0)
Lymphocytes Relative: 25.4 % (ref 12.0–46.0)
MCHC: 33.9 g/dL (ref 30.0–36.0)
MCV: 90.8 fl (ref 78.0–100.0)
Monocytes Absolute: 0.6 10*3/uL (ref 0.1–1.0)
Monocytes Relative: 7.3 % (ref 3.0–12.0)
NEUTROS ABS: 5.1 10*3/uL (ref 1.4–7.7)
Neutrophils Relative %: 65.1 % (ref 43.0–77.0)
Platelets: 137 10*3/uL — ABNORMAL LOW (ref 150.0–400.0)
RBC: 4.78 Mil/uL (ref 4.22–5.81)
RDW: 12.9 % (ref 11.5–15.5)
WBC: 7.8 10*3/uL (ref 4.0–10.5)

## 2015-02-11 LAB — BASIC METABOLIC PANEL
BUN: 12 mg/dL (ref 6–23)
CALCIUM: 9.5 mg/dL (ref 8.4–10.5)
CO2: 31 mEq/L (ref 19–32)
Chloride: 101 mEq/L (ref 96–112)
Creatinine, Ser: 1.22 mg/dL (ref 0.40–1.50)
GFR: 62.89 mL/min (ref >60.00)
Glucose, Bld: 120 mg/dL — ABNORMAL HIGH (ref 70–99)
POTASSIUM: 4.1 meq/L (ref 3.5–5.1)
Sodium: 136 mEq/L (ref 135–145)

## 2015-02-11 NOTE — Addendum Note (Signed)
Addended by: Eulis Foster on: 02/11/2015 01:31 PM   Modules accepted: Orders

## 2015-02-12 ENCOUNTER — Other Ambulatory Visit: Payer: Medicare Other

## 2015-02-15 ENCOUNTER — Ambulatory Visit (HOSPITAL_COMMUNITY)
Admission: RE | Admit: 2015-02-15 | Discharge: 2015-02-16 | Disposition: A | Discharge status: Home / Self Care | Payer: Medicare Other | Source: Ambulatory Visit | Attending: Internal Medicine | Admitting: Internal Medicine

## 2015-02-15 ENCOUNTER — Encounter (HOSPITAL_COMMUNITY)
Admission: RE | Disposition: A | Discharge status: Home / Self Care | Payer: Self-pay | Source: Ambulatory Visit | Attending: Internal Medicine

## 2015-02-15 ENCOUNTER — Encounter (HOSPITAL_COMMUNITY): Payer: Self-pay | Admitting: *Deleted

## 2015-02-15 DIAGNOSIS — I10 Essential (primary) hypertension: Secondary | ICD-10-CM | POA: Diagnosis not present

## 2015-02-15 DIAGNOSIS — I2510 Atherosclerotic heart disease of native coronary artery without angina pectoris: Secondary | ICD-10-CM | POA: Diagnosis not present

## 2015-02-15 DIAGNOSIS — Z7982 Long term (current) use of aspirin: Secondary | ICD-10-CM | POA: Insufficient documentation

## 2015-02-15 DIAGNOSIS — I472 Ventricular tachycardia, unspecified: Secondary | ICD-10-CM

## 2015-02-15 DIAGNOSIS — E119 Type 2 diabetes mellitus without complications: Secondary | ICD-10-CM | POA: Insufficient documentation

## 2015-02-15 DIAGNOSIS — Z9049 Acquired absence of other specified parts of digestive tract: Secondary | ICD-10-CM | POA: Diagnosis not present

## 2015-02-15 DIAGNOSIS — Z87891 Personal history of nicotine dependence: Secondary | ICD-10-CM | POA: Insufficient documentation

## 2015-02-15 DIAGNOSIS — Z79899 Other long term (current) drug therapy: Secondary | ICD-10-CM | POA: Insufficient documentation

## 2015-02-15 DIAGNOSIS — Z951 Presence of aortocoronary bypass graft: Secondary | ICD-10-CM | POA: Insufficient documentation

## 2015-02-15 DIAGNOSIS — E785 Hyperlipidemia, unspecified: Secondary | ICD-10-CM | POA: Insufficient documentation

## 2015-02-15 DIAGNOSIS — I493 Ventricular premature depolarization: Secondary | ICD-10-CM | POA: Diagnosis not present

## 2015-02-15 HISTORY — PX: V-TACH ABLATION: SHX5498

## 2015-02-15 LAB — GLUCOSE, CAPILLARY
GLUCOSE-CAPILLARY: 135 mg/dL — AB (ref 70–99)
Glucose-Capillary: 118 mg/dL — ABNORMAL HIGH (ref 70–99)
Glucose-Capillary: 112 mg/dL — ABNORMAL HIGH (ref 70–99)

## 2015-02-15 LAB — POCT ACTIVATED CLOTTING TIME
ACTIVATED CLOTTING TIME: 196 s
ACTIVATED CLOTTING TIME: 220 s
Activated Clotting Time: 165 seconds
Activated Clotting Time: 190 seconds

## 2015-02-15 SURGERY — V-TACH ABLATION
Anesthesia: LOCAL

## 2015-02-15 MED ORDER — ROSUVASTATIN CALCIUM 20 MG PO TABS
20.0000 mg | ORAL_TABLET | Freq: Every day | ORAL | Status: DC
  Administered 2015-02-15: 22:00:00 20 mg via ORAL
  Filled 2015-02-15 (×2): qty 1

## 2015-02-15 MED ORDER — MIDAZOLAM HCL 5 MG/5ML IJ SOLN
INTRAMUSCULAR | Status: AC
  Filled 2015-02-15: qty 5

## 2015-02-15 MED ORDER — SODIUM CHLORIDE 0.9 % IV SOLN: 250.0000 mL | INTRAVENOUS | Status: DC | PRN

## 2015-02-15 MED ORDER — HEPARIN SODIUM (PORCINE) 1000 UNIT/ML IJ SOLN
INTRAMUSCULAR | Status: AC
  Filled 2015-02-15: qty 1

## 2015-02-15 MED ORDER — ONDANSETRON HCL 4 MG/2ML IJ SOLN: 4.0000 mg | Freq: Four times a day (QID) | INTRAMUSCULAR | Status: DC | PRN

## 2015-02-15 MED ORDER — METFORMIN HCL 500 MG PO TABS: 1000.0000 mg | ORAL_TABLET | Freq: Two times a day (BID) | ORAL | Status: DC

## 2015-02-15 MED ORDER — ACETAMINOPHEN 325 MG PO TABS
650.0000 mg | ORAL_TABLET | ORAL | Status: DC | PRN
  Administered 2015-02-15: 22:00:00 650 mg via ORAL
  Filled 2015-02-15: qty 2

## 2015-02-15 MED ORDER — SODIUM CHLORIDE 0.9 % IJ SOLN: 3.0000 mL | INTRAMUSCULAR | Status: DC | PRN

## 2015-02-15 MED ORDER — OFF THE BEAT BOOK
Freq: Once | Status: AC
  Administered 2015-02-15: 23:00:00
  Filled 2015-02-15: qty 1

## 2015-02-15 MED ORDER — BUPIVACAINE HCL (PF) 0.25 % IJ SOLN
INTRAMUSCULAR | Status: AC
  Filled 2015-02-15: qty 30

## 2015-02-15 MED ORDER — METFORMIN HCL 500 MG PO TABS
1000.0000 mg | ORAL_TABLET | Freq: Two times a day (BID) | ORAL | Status: DC
  Administered 2015-02-16: 1000 mg via ORAL
  Filled 2015-02-15 (×3): qty 2

## 2015-02-15 MED ORDER — HEPARIN (PORCINE) IN NACL 2-0.9 UNIT/ML-% IJ SOLN
INTRAMUSCULAR | Status: AC
  Filled 2015-02-15: qty 500

## 2015-02-15 MED ORDER — DONEPEZIL HCL 5 MG PO TABS
5.0000 mg | ORAL_TABLET | Freq: Every day | ORAL | Status: DC
  Administered 2015-02-15: 5 mg via ORAL
  Filled 2015-02-15 (×2): qty 1

## 2015-02-15 MED ORDER — VITAMIN B-12 1000 MCG PO TABS
1000.0000 ug | ORAL_TABLET | Freq: Every day | ORAL | Status: DC
  Filled 2015-02-15: qty 1

## 2015-02-15 MED ORDER — METOPROLOL TARTRATE 25 MG PO TABS
25.0000 mg | ORAL_TABLET | Freq: Two times a day (BID) | ORAL | Status: DC
  Administered 2015-02-15: 22:00:00 25 mg via ORAL
  Filled 2015-02-15: qty 1

## 2015-02-15 MED ORDER — LEVOTHYROXINE SODIUM 88 MCG PO TABS
88.0000 ug | ORAL_TABLET | Freq: Every day | ORAL | Status: DC
  Administered 2015-02-16: 07:00:00 88 ug via ORAL
  Filled 2015-02-15 (×2): qty 1

## 2015-02-15 MED ORDER — FENTANYL CITRATE 0.05 MG/ML IJ SOLN
INTRAMUSCULAR | Status: AC
  Filled 2015-02-15: qty 2

## 2015-02-15 MED ORDER — SODIUM CHLORIDE 0.9 % IJ SOLN: 3.0000 mL | Freq: Two times a day (BID) | INTRAMUSCULAR | Status: DC

## 2015-02-15 NOTE — Discharge Summary (Addendum)
ELECTROPHYSIOLOGY PROCEDURE DISCHARGE SUMMARY    Patient ID: [name] [name],  MRN: [id number], DOB/AGE: 1946-05-28 69 y.o.  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  CAD s/p CABG 2.  Hypertension 3.  Hyperlipidemia 4.  Diabetes  No Known Allergies   Procedures This Admission:  1.  Electrophysiology study and radiofrequency catheter ablation on 02/15/15 by Dr Lovena Le.  This demonstrated successful ablation of the dominant PVC foci with no early apparent complications.  See op note for full details.   Brief HPI: [name] [name] is a 69 y.o. male with a past medical history as outlined above.  He has been found to have symptomatic bigeminal PVC's.  Treatment options were discussed with the patient including medications or ablation.  Risks, benefits, and alternatives to ablation were reviewed with the patient who wished to proceed.   Hospital Course:  The patient was admitted underwent ablation with details as outlined above.  He was monitored overnight on telemetry which demonstrated sinus rhythm/sinus brady with occasional PVC's.  His groin and neck incisions were without complication.  He was evaluated by Dr Lovena Le and considered stable for discharge to home.  He will be seen in the office in 4 weeks for post ablation follow up.   Physical Exam: Filed Vitals:   02/15/15 2005 02/15/15 2031 02/16/15 0000 02/16/15 0552  BP: 132/70 147/81 109/58 136/55  Pulse: 48 54 60 46  Temp:  97.6 F (36.4 C) 98.2 F (36.8 C) 98 F (36.7 C)  TempSrc:  Oral Oral Oral  Resp: 17 15 17 20   Height:  5\' 5"  (1.651 m)    Weight:  205 lb 14.6 oz (93.4 kg) 205 lb 14.6 oz (93.4 kg)   SpO2: 97% 99% 94% 93%    GEN- The patient is well appearing, alert and oriented x 3  today.   HEENT: normocephalic, atraumatic; sclera clear, conjunctiva pink; hearing intact; oropharynx clear; neck supple  Lungs- Clear to ausculation bilaterally, normal work of breathing.  No wheezes, rales, rhonchi Heart- Regular rate and rhythm, no murmurs, rubs or gallops  GI- soft, non-tender, non-distended, bowel sounds present  Extremities- no clubbing, cyanosis, or edema; DP/PT/radial pulses 2+ bilaterally MS- no significant deformity or atrophy Skin- warm and dry, no rash or lesion Psych- euthymic mood, full affect Neuro- strength and sensation are intact    Labs:   Lab Results  Component Value Date   WBC 7.8 02/11/2015   HGB 14.7 02/11/2015   HCT 43.4 02/11/2015   MCV 90.8 02/11/2015   PLT 137.0* 02/11/2015     Recent Labs Lab 02/11/15 1331  NA 136  K 4.1  CL 101  CO2 31  BUN 12  CREATININE 1.22  CALCIUM 9.5  GLUCOSE 120*     Discharge Medications:    Medication List    TAKE these medications        aspirin 81 MG tablet  Take 81 mg by mouth daily.     CRESTOR 20 MG tablet  Generic drug:  rosuvastatin  Take 1 tablet by mouth daily.     donepezil 5 MG tablet  Commonly known as:  ARICEPT  Take 5 mg by mouth daily.     levothyroxine 88 MCG tablet  Commonly known as:  SYNTHROID, LEVOTHROID  Take 88 mcg by mouth daily before breakfast.  metFORMIN 1000 MG tablet  Commonly known as:  GLUCOPHAGE  Take 1,000 mg by mouth 2 (two) times daily with a meal.     metoprolol 50 MG tablet  Commonly known as:  LOPRESSOR  Take 25 mg by mouth 2 (two) times daily.     omeprazole 20 MG tablet  Commonly known as:  PRILOSEC OTC  Take 20 mg by mouth daily.     ONE TOUCH ULTRA TEST test strip  Generic drug:  glucose blood  daily. for testing     vitamin B-12 1000 MCG tablet  Commonly known as:  CYANOCOBALAMIN  Take 1,000 mcg by mouth daily.        Disposition:  Discharge Instructions    Diet - low sodium heart healthy    Complete by:  As directed       Increase activity slowly    Complete by:  As directed           Follow-up Information    Follow up with Patsey Berthold, NP On 03/15/2015.   Specialty:  Nurse Practitioner   Why:  at 8:30AM   Contact information:   [place] [address] [phone]       Duration of Discharge Encounter: Greater than 30 minutes including physician time.  Signed, Chanetta Marshall, NP 02/16/2015 7:10 AM  EP Attending  Patient seen and examined. Agree with the findings as noted above by Chanetta Marshall, NP. The patient has done well overnight with only rare PVC's. He is stable for discharge and to continue his current meds.  Mikle Bosworth.D.

## 2015-02-15 NOTE — Interval H&P Note (Signed)
History and Physical Interval Note:  02/15/2015 1:53 PM  [name] [name]  has presented today for surgery, with the diagnosis of PVC  The various methods of treatment have been discussed with the patient and family. After consideration of risks, benefits and other options for treatment, the patient has consented to  Procedure(s): PVC ABLATION (N/A) as a surgical intervention .  The patient's history has been reviewed, patient examined, no change in status, stable for surgery.  I have reviewed the patient's chart and labs.  Questions were answered to the patient's satisfaction.     Mikle Bosworth.D

## 2015-02-15 NOTE — H&P (View-Only) (Signed)
HPI [name] [name] returns today for followup. He is a very pleasant 69 year old man with a long-standing history of coronary artery disease, status post bypass surgery in 2001. He has preserved left ventricular systolic function. The patient has had mostly fatigue and weakness. He has had a slow resting pulse, and on 3 subsequent ECGs, over the past 3 months, he has had sinus rhythm with ventricular bigeminy. The PVCs are a right bundle QRS morphology. The axis is superior and leftward. The patient does not have much in the way of palpitations, and has never had syncope. The patient struggles walking up a grade, but does okay on flat ground. He continues to work. No Known Allergies   Current Outpatient Prescriptions  Medication Sig Dispense Refill  . aspirin 81 MG tablet Take 81 mg by mouth daily.    . CRESTOR 20 MG tablet Take 1 tablet by mouth daily.    Marland Kitchen donepezil (ARICEPT) 5 MG tablet Take 5 mg by mouth at bedtime.   1  . levothyroxine (SYNTHROID, LEVOTHROID) 88 MCG tablet Take 88 mcg by mouth daily before breakfast.   0  . metFORMIN (GLUCOPHAGE) 1000 MG tablet Take 1,000 mg by mouth 2 (two) times daily with a meal.     . metoprolol (LOPRESSOR) 50 MG tablet Take 25 mg by mouth 2 (two) times daily.   0  . omeprazole (PRILOSEC OTC) 20 MG tablet Take 20 mg by mouth daily.    . ONE TOUCH ULTRA TEST test strip daily. for testing  0  . vitamin B-12 (CYANOCOBALAMIN) 1000 MCG tablet Take 1,000 mcg by mouth daily.     No current facility-administered medications for this visit.     Past Medical History  Diagnosis Date  . CAD (coronary artery disease)     s/p 3V CABG 2001  . HTN (hypertension)   . Hyperlipidemia   . Diabetes mellitus   . Tobacco abuse, in remission     ROS:   All systems reviewed and negative except as noted in the HPI.   Past Surgical History  Procedure Laterality Date  . Cholecystectomy    . Neck surgery    . Coronary artery bypass graft    . Pineal cyst  removal    . Bone spur removal right shoulder       Family History  Problem Relation Age of Onset  . Heart attack Father 70  . Diabetes Father   . Heart attack Paternal Grandfather 74  . Diabetes Mother      History   Social History  . Marital Status: Married    Spouse Name: N/A  . Number of Children: 4  . Years of Education: N/A   Occupational History  . Retired Administrator    Social History Main Topics  . Smoking status: Former Smoker -- 3.00 packs/day for 35 years    Types: Cigarettes    Quit date: 09/16/2000  . Smokeless tobacco: Not on file  . Alcohol Use: No  . Drug Use: No  . Sexual Activity: Not on file   Other Topics Concern  . Not on file   Social History Narrative     BP 122/74 mmHg  Pulse 58  Ht 5' 5.5" (1.664 m)  Wt 216 lb 9.6 oz (98.249 kg)  BMI 35.48 kg/m2  Physical Exam:  Well appearing 69 year old man, NAD HEENT: Unremarkable Neck:  No JVD, no thyromegally Back:  No CVA tenderness Lungs:  Clear with no wheezes, rales,  or rhonchi. HEART:  IRegular rate rhythm, no murmurs, no rubs, no clicks Abd:  soft, positive bowel sounds, no organomegally, no rebound, no guarding Ext:  2 plus pulses, no edema, no cyanosis, no clubbing Skin:  No rashes no nodules Neuro:  CN II through XII intact, motor grossly intact  EKG - normal sinus rhythm with frequent PVCs in a bigeminal fashion.   Assess/Plan:

## 2015-02-15 NOTE — Progress Notes (Addendum)
Site area: rt groin sheaths removed and pressure held by [employer] Prior to Removal:  Level  0 Pressure Applied For:  30 minutes Manual:   yes Patient Status During Pull:  stable Post Pull Site:  Level  0 Post Pull Instructions Given:  yes Post Pull Pulses Present: yes Dressing Applied:  tegaderm Bedrest begins @  2010 Comments:  0 complications. IV saline locked.

## 2015-02-15 NOTE — Progress Notes (Signed)
Site area: rt neck venous sheath Site Prior to Removal:  Level 0, very small bruise Pressure Applied For: 10 minutes(less than a dime) Manual:   yes Patient Status During Pull:  stable Post Pull Site:  Level  0 Post Pull Instructions Given:  yes Post Pull Pulses Present: na Dressing Applied:  Small tegaderm Bedrest begins @  Comments:

## 2015-02-15 NOTE — Discharge Instructions (Signed)
No driving for 5 days. No lifting over 5 lbs for 1 week. No sexual activity for 1 week.  Keep procedure site clean & dry. If you notice increased pain, swelling, bleeding or pus, call/return!  You may shower, but no soaking baths/hot tubs/pools for 1 week.  ° ° °

## 2015-02-15 NOTE — CV Procedure (Signed)
EP Procedure Noted  Procedure: EP study and catheter ablation using 3 D electroanatomic mapping.  Preoperative diagnosis: symptomatic PVC  Postoperative diagnosis: same as preoperative diagnosis  Description of the Procedure: After informed consent was obtained, the patient was taken to the diagnostic EP lab in the fasting state. After the usual preparation draping, intravenous Versed and fentanyl was used for sedation. A 6 French hexapolar catheter was inserted percutaneously into the right jugular vein and advanced to the coronary sinus. A 6 French quadripolar catheter was inserted percutaneously in the right femoral vein and advanced to the right ventricle. A 6 French quadripolar catheter was inserted percutaneously into the right femoral vein and advanced the his bundle region. Mapping was carried out. It demonstrated a PVC originating from the left ventricle near the base of the septum about halfway from diaphragm to superior surface. Rapid ventricular pacing was carried out at 600 ms and stepwise decreased down to 400 ms where VA block was observed. The atrial activation was midline and decremental. Next programmed ventricular stimulation was carried out from the right ventricle at a pacing cycle length of 500 ms. S1-S2 interval was stepwise decreased from 400 ms down to 380 ms with retrograde AV node ERP was observed. During programmed ventricular stimulation, the atrial activation was midline and decremental. Next programmed atrial ablation was carried out for coronary sinus at a pacing cycle length of 500 ms. The S1-S2 interval was stepwise decreased from 440 ms down to 340 ms with the AV node ERP was observed. During programmed atrial stimulation, there were no AH jumps or echo beats or inducible SVT. Next rapid atrial pacing was carried out from the coronary sinus and stepwise decreased down to 340 ms with AV Wenckebach was observed. At this point the right femoral artery was punctured and a 7  French quadripolar ablation catheter with electro anatomic mapping capabilities was advanced retrograde across the aortic valve into the left ventricle. 6000 units of heparin was given intravenously. Mapping with 3-D electro anatomic navigation was carried out. The patient's clinical PVC with originating from the septal side of the left ventricle near the base, and approximately 8:00 in the LAO projection. 12 brief radiofrequency energy applications were delivered. Our energy was limited because of high temperature spikes. At the final site which was successful, we were able to give enough power to render the PVC noninducible, and for which spontaneous PVCs did not recur from this location. The patient had a nonclinical PVC which was occurring on the high lateral wall near the base. Following ablation of the first PVC, mapping was attempted of this second PVC. Unfortunately with catheter manipulation, nonsustained VT was induced during the mapping process and mapping of the PVC could not be carried out. The patient was observed for 15 minutes. He had no clinical PVC. The catheters were then removed and hemostasis was assured and the patient returned to the recovery area in satisfactory condition.  Complications: There were no immediate procedure palpitations  Results. 1. Baseline ECG. The baseline ECG demonstrated normal sinus rhythm with frequent PVCs 2. Baseline intervals. The sinus node cycle length was 1100 ms. The QRS duration was 96 ms. The HV interval was 51 ms. The AH interval was 96 ms. Following ablation, there is no appreciable difference in the QRS duration or other intervals. 3. Rapid ventricular pacing. Rapid ventricular pacing demonstrated VA block at 400 ms. During rapid ventricular pacing, the atrial activation was midline and decremental. Rapid ventricular pacing had no effect on the  patient's PVCs. 4. Programmed ventricular ablation. Programmed ventricular ablation was carried out from the  right ventricle at a pacing cycle length of 500 ms. The S1-S2 interval was stepwise decreased down to 380 ms. The retrograde AV node ERP was observed. During programmed ventricular stimulation, there was no inducible VT. 5. Rapid atrial pacing. Rapid pacing was carried out from the coronary sinus demonstrated AV block at 340 ms. During rapid atrial pacing, there was no inducible SVT or VT. 6. Programmed atrial stimulation. Programmed atrial ablation was carried out the coronary sinus at a pacing cycle length of 500 ms. The S1-S2 interval was stepwise decreased down to 340 ms with the AV node ERP was observed. During programmed atrial ablation, there was no inducible SVT. 7. Arrhythmias observed. Sinus rhythm with dense PVCs. 8. Mapping mapping of the dense PVCs demonstrated 2 different foci. The dominant foci was located in the left ventricular septum near the base and in the LAO view at approximately 8:00. This was fairly close to the AV node, but not so close as to prevent successful ablation. 9. Radio frequency energy application. 12 radio frequency energy applications were delivered. During radiofrequency energy application, the patient's clinical PVC resolved.  Conclusion: Successful PVC ablation in a patient with symptomatically PVCs, refractory to medical therapy. There were no immediate procedural complications.  Cristopher Peru, M.D.

## 2015-02-15 NOTE — CV Procedure (Deleted)
EP procedure note  Procedure performed: Extraction of a previously implanted and broken active fixation defibrillation lead, and insertion of a new active fixation defibrillation lead  Preprocedure diagnosis: Broken ICD lead in a patient with a long-standing nonischemic cardio myopathy  Postprocedure diagnosis: Same as preprocedure diagnosis  Description of the procedure: After informed consent was obtained, the patient was taken to the operating room in the fasting state. After the usual preparation and draping, anesthesia was service was utilized to provide general anesthesia. Invasive arterial monitoring was also carried out. After the appropriate timeout, the right femoral vein was punctured and a 6 [place] hemostatic sheath placed. Attention was then turned to the ICD system. A 7 cm incision was carried out over the previous ICD incision site in the left infraclavicular region. Electrocautery was utilized to dissect down to the ICD pocket. The generator was removed with gentle traction. The defibrillator was disconnected from the defibrillation lead and the atrial lead. Electrocautery was utilized to free up the fibrous adhesions. The left subclavian vein was punctured. Attempts to advance a Glidewire were unsuccessful as the vein was occluded proximally. At this point attention was then turned to removal of the ICD lead. Attempts to pass a stylette into the lead resulted in the stylette not being advanced beyond the clavicle/first rib junction. It appeared that the lead was crushed in this location. A liberator locking stylette was advanced to this location. A Cook one tie was utilized to secure the proximal portion of the lead. At this point the 11 [place] Cook short RL extraction sheath was advanced into the subclavian vein innominate vein junction. The short Cook extraction sheath was removed and the 11 [place] Cook RL extraction sheath was advanced over the lead body and into the subclavian vein.  Gentle traction was placed on the lead. The cutting portion of the lead was utilized to work over the dense fibrous adhesions and with gentle traction the lead was removed from the right ventricle but was bound at the junction of the right atrium and right ventricle. Initial attempts to place tension on the lead resulted in no movement. At this point the sheath was advanced down to the tip of the lead and after considerable amount of work the lead was freed up completely with no hemodynamic consequences. The outer sheath was maintained in the central circulation beyond the occluded portion. The [employer] Jude active fixation defibrillation lead, serial number [id number], was advanced into the right ventricle, and mapping carried out. At the final site, the lead was actively fixed, and the R waves measured greater than 12 mV. The pacing impedance was 700 ohms. The threshold was less than a volt at 0.5 ms. With the satisfactory parameters, the lead was secured to the fascia with silk suture. After hemostasis was obtained, and the pocket irrigated with antibiotic irrigation, the previously removed St. Jude ICD was reconnected to the new ICD lead and placed back in the subcutaneous pocket. The pocket was irrigated with additional irrigation and the incision was closed with 2 layers of Vicryl suture. The 6 French sheath was removed from the groin. The patient was returned to the recovery area in satisfactory condition.  Complications: There were no immediate procedure complications  Conclusion: Successful extraction of a broken St. Jude ICD lead, and insertion of a new St. Jude ICD lead in a patient with a long-standing nonischemic cardiomyopathy and chronic systolic heart failure. The patient's previously implanted ICD generator was connected to the new ICD lead for the  completed system.  Cristopher Peru, M.D.

## 2015-02-16 ENCOUNTER — Encounter: Payer: Self-pay | Admitting: Nurse Practitioner

## 2015-02-16 DIAGNOSIS — I10 Essential (primary) hypertension: Secondary | ICD-10-CM | POA: Diagnosis not present

## 2015-02-16 DIAGNOSIS — E785 Hyperlipidemia, unspecified: Secondary | ICD-10-CM | POA: Diagnosis not present

## 2015-02-16 DIAGNOSIS — I493 Ventricular premature depolarization: Secondary | ICD-10-CM

## 2015-02-16 DIAGNOSIS — I2510 Atherosclerotic heart disease of native coronary artery without angina pectoris: Secondary | ICD-10-CM | POA: Diagnosis not present

## 2015-02-16 LAB — GLUCOSE, CAPILLARY: GLUCOSE-CAPILLARY: 153 mg/dL — AB (ref 70–99)

## 2015-02-22 ENCOUNTER — Telehealth: Payer: Self-pay | Admitting: Internal Medicine

## 2015-02-22 NOTE — Telephone Encounter (Signed)
New message        Pt would like kelly to give him a call

## 2015-02-22 NOTE — Telephone Encounter (Signed)
Okay to return to work on Monday and may start mowing his yard

## 2015-03-14 ENCOUNTER — Encounter: Payer: Self-pay | Admitting: Nurse Practitioner

## 2015-03-14 NOTE — Progress Notes (Signed)
Electrophysiology Office Note Date: 03/14/2015  ID:  [name] [name], [name] [name] [birth year], MRN [id number]  PCP: Leonard Downing, MD Primary Cardiologist: Julianne Handler Electrophysiologist: Lovena Le  CC: follow-up post PVC ablation  [name] [name] is a 69 y.o. male is seen today for Dr Lovena Le.   He was referred to Dr Lovena Le for symptomatic bigeminal PVC's.  He underwent ablation on 02/15/15 and presents today for post hospital follow up.   Since discharge, the patient reports doing very well. His energy level is improved but he still has some daytime somnolence and fatigue.  He denies chest pain, palpitations, dyspnea, PND, orthopnea, nausea, vomiting, dizziness, syncope.  Past Medical History  Diagnosis Date  . CAD (coronary artery disease)     s/p 3V CABG 2001  . HTN (hypertension)   . Hyperlipidemia   . Diabetes mellitus   . Tobacco abuse, in remission   . PVC (premature ventricular contraction)     a. s/p ablation   Past Surgical History  Procedure Laterality Date  . Cholecystectomy    . Neck surgery    . Coronary artery bypass graft    . Pineal cyst removal    . Bone spur removal right shoulder    . V-tach ablation N/A 02/15/2015    PVC ablation by Dr Lovena Le    Current Outpatient Prescriptions  Medication Sig Dispense Refill  . aspirin 81 MG tablet Take 81 mg by mouth daily.    . CRESTOR 20 MG tablet Take 1 tablet by mouth daily.    Marland Kitchen donepezil (ARICEPT) 5 MG tablet Take 5 mg by mouth daily.   1  . levothyroxine (SYNTHROID, LEVOTHROID) 88 MCG tablet Take 88 mcg by mouth daily before breakfast.   0  . metFORMIN (GLUCOPHAGE) 1000 MG tablet Take 1,000 mg by mouth 2 (two) times daily with a meal.     . metoprolol (LOPRESSOR) 50 MG tablet Take 25 mg by mouth 2 (two) times daily.   0  . omeprazole (PRILOSEC OTC) 20 MG tablet Take 20 mg by mouth daily.    . ONE TOUCH ULTRA TEST test strip daily. for testing  0  . vitamin B-12 (CYANOCOBALAMIN) 1000 MCG tablet Take 1,000 mcg by mouth  daily.     No current facility-administered medications for this visit.    Allergies:   Review of patient's allergies indicates no known allergies.   Social History: History   Social History  . Marital Status: Married    Spouse Name: N/A  . Number of Children: 4  . Years of Education: N/A   Occupational History  . Retired Administrator    Social History Main Topics  . Smoking status: Former Smoker -- 3.00 packs/day for 35 years    Types: Cigarettes    Quit date: 09/16/2000  . Smokeless tobacco: Not on file  . Alcohol Use: No  . Drug Use: No  . Sexual Activity: Not on file   Other Topics Concern  . Not on file   Social History Narrative    Family History: Family History  Problem Relation Age of Onset  . Heart attack Father 67  . Diabetes Father   . Heart attack Paternal Grandfather 74  . Diabetes Mother     Review of Systems: All other systems reviewed and are otherwise negative except as noted above.   Physical Exam: VS:  There were no vitals taken for this visit. , BMI There is no weight on file to calculate BMI. Wt Readings  from Last 3 Encounters:  02/16/15 205 lb 14.6 oz (93.4 kg)  01/20/15 216 lb 9.6 oz (98.249 kg)  12/28/14 217 lb 9.6 oz (98.703 kg)    GEN- The patient is well appearing, alert and oriented x 3 today.   HEENT: normocephalic, atraumatic; sclera clear, conjunctiva pink; hearing intact; oropharynx clear; neck supple, no JVP Lymph- no cervical lymphadenopathy Lungs- Clear to ausculation bilaterally, normal work of breathing.  No wheezes, rales, rhonchi Heart- Regular rate and rhythm, no murmurs, rubs or gallops, PMI not laterally displaced GI- soft, non-tender, non-distended, bowel sounds present, no hepatosplenomegaly Extremities- no clubbing, cyanosis, or edema; DP/PT/radial pulses 2+ bilaterally MS- no significant deformity or atrophy Skin- warm and dry, no rash or lesion  Psych- euthymic mood, full affect Neuro- strength and  sensation are intact   EKG:  EKG is ordered today. The ekg ordered today shows sinus brady, rate 52, normal intervals  Recent Labs: 02/11/2015: BUN 12; Creatinine 1.22; Hemoglobin 14.7; Platelets 137.0*; Potassium 4.1; Sodium 136    Other studies Reviewed: Additional studies/ records that were reviewed today include: hospital records  Assessment and Plan: 1.  PVC's S/p ablation 02/15/15 by Dr Lovena Le.  The dominant PVC was located in the LV septum near the base and was successfully ablated. The patient has had slight improvement in fatigue since ablation.   2.  Sinus node dysfunction The patient declined holter monitor today to better assess HR excursion  3.  CAD s/p CABG No recent ischemic symptoms Continue ASA/statin/BB  4.  HTN Stable No change required today  5.  Daytime somnolence The patient reports snoring He declined sleep study today.    Current medicines are reviewed at length with the patient today.   The patient does not have concerns regarding his medicines.  The following changes were made today:  none  Labs/ tests ordered today include: none   Disposition:   Follow up with Dr Julianne Handler next month as scheduled, follow up Dr Lovena Le 3 months   Signed, Chanetta Marshall, NP 03/14/2015 11:19 AM   [place] [address] [place] [address] [phone] (office) [phone] (fax)

## 2015-03-15 ENCOUNTER — Ambulatory Visit (INDEPENDENT_AMBULATORY_CARE_PROVIDER_SITE_OTHER): Payer: Medicare Other | Admitting: Nurse Practitioner

## 2015-03-15 ENCOUNTER — Encounter: Payer: Self-pay | Admitting: Nurse Practitioner

## 2015-03-15 VITALS — BP 124/72 | HR 52 | Ht 65.0 in | Wt 213.1 lb

## 2015-03-15 DIAGNOSIS — I493 Ventricular premature depolarization: Secondary | ICD-10-CM | POA: Diagnosis not present

## 2015-03-15 DIAGNOSIS — I495 Sick sinus syndrome: Secondary | ICD-10-CM

## 2015-03-15 DIAGNOSIS — I10 Essential (primary) hypertension: Secondary | ICD-10-CM | POA: Diagnosis not present

## 2015-03-15 NOTE — Patient Instructions (Signed)
Medication Instructions: - no changes  Labwork: - none  Procedures/Testing: - none  Follow-Up: - as scheduled with Dr. Angelena Form  - Your physician recommends that you schedule a follow-up appointment in: 3 months with Dr. Lovena Le.  Any Additional Special Instructions Will Be Listed Below (If Applicable). - none

## 2015-03-20 ENCOUNTER — Encounter (HOSPITAL_COMMUNITY): Payer: Self-pay | Admitting: Family Medicine

## 2015-03-20 ENCOUNTER — Emergency Department (HOSPITAL_COMMUNITY): Payer: Medicare Other

## 2015-03-20 ENCOUNTER — Emergency Department (HOSPITAL_COMMUNITY)
Admission: EM | Admit: 2015-03-20 | Discharge: 2015-03-20 | Disposition: A | Discharge status: Home / Self Care | Payer: Medicare Other | Attending: Emergency Medicine | Admitting: Emergency Medicine

## 2015-03-20 DIAGNOSIS — E119 Type 2 diabetes mellitus without complications: Secondary | ICD-10-CM | POA: Diagnosis not present

## 2015-03-20 DIAGNOSIS — R1013 Epigastric pain: Secondary | ICD-10-CM | POA: Diagnosis not present

## 2015-03-20 DIAGNOSIS — Z951 Presence of aortocoronary bypass graft: Secondary | ICD-10-CM | POA: Diagnosis not present

## 2015-03-20 DIAGNOSIS — I2510 Atherosclerotic heart disease of native coronary artery without angina pectoris: Secondary | ICD-10-CM | POA: Diagnosis not present

## 2015-03-20 DIAGNOSIS — R079 Chest pain, unspecified: Secondary | ICD-10-CM | POA: Insufficient documentation

## 2015-03-20 DIAGNOSIS — Z7982 Long term (current) use of aspirin: Secondary | ICD-10-CM | POA: Insufficient documentation

## 2015-03-20 DIAGNOSIS — Z79899 Other long term (current) drug therapy: Secondary | ICD-10-CM | POA: Insufficient documentation

## 2015-03-20 DIAGNOSIS — E785 Hyperlipidemia, unspecified: Secondary | ICD-10-CM | POA: Insufficient documentation

## 2015-03-20 DIAGNOSIS — Z87891 Personal history of nicotine dependence: Secondary | ICD-10-CM | POA: Insufficient documentation

## 2015-03-20 DIAGNOSIS — Z9049 Acquired absence of other specified parts of digestive tract: Secondary | ICD-10-CM | POA: Insufficient documentation

## 2015-03-20 DIAGNOSIS — R1010 Upper abdominal pain, unspecified: Secondary | ICD-10-CM | POA: Diagnosis present

## 2015-03-20 DIAGNOSIS — R109 Unspecified abdominal pain: Secondary | ICD-10-CM

## 2015-03-20 DIAGNOSIS — R1012 Left upper quadrant pain: Secondary | ICD-10-CM | POA: Insufficient documentation

## 2015-03-20 DIAGNOSIS — R1011 Right upper quadrant pain: Secondary | ICD-10-CM | POA: Diagnosis not present

## 2015-03-20 DIAGNOSIS — I10 Essential (primary) hypertension: Secondary | ICD-10-CM | POA: Diagnosis not present

## 2015-03-20 LAB — COMPREHENSIVE METABOLIC PANEL
ALBUMIN: 3.8 g/dL (ref 3.5–5.0)
ALK PHOS: 57 U/L (ref 38–126)
ALT: 30 U/L (ref 17–63)
AST: 30 U/L (ref 15–41)
Anion gap: 9 (ref 5–15)
BUN: 8 mg/dL (ref 6–20)
CO2: 26 mmol/L (ref 22–32)
Calcium: 9.4 mg/dL (ref 8.9–10.3)
Chloride: 100 mmol/L — ABNORMAL LOW (ref 101–111)
Creatinine, Ser: 1.23 mg/dL (ref 0.61–1.24)
GFR calc Af Amer: >60 mL/min (ref >60)
GFR calc non Af Amer: 59 mL/min — ABNORMAL LOW (ref >60)
Glucose, Bld: 166 mg/dL — ABNORMAL HIGH (ref 65–99)
POTASSIUM: 4.3 mmol/L (ref 3.5–5.1)
SODIUM: 135 mmol/L (ref 135–145)
TOTAL PROTEIN: 6.7 g/dL (ref 6.5–8.1)
Total Bilirubin: 0.5 mg/dL (ref 0.3–1.2)

## 2015-03-20 LAB — CBC WITH DIFFERENTIAL/PLATELET
Basophils Absolute: 0 10*3/uL (ref 0.0–0.1)
Basophils Relative: 0 % (ref 0–1)
EOS ABS: 0.1 10*3/uL (ref 0.0–0.7)
Eosinophils Relative: 2 % (ref 0–5)
HCT: 44.5 % (ref 39.0–52.0)
Hemoglobin: 15 g/dL (ref 13.0–17.0)
LYMPHS PCT: 20 % (ref 12–46)
Lymphs Abs: 1.7 10*3/uL (ref 0.7–4.0)
MCH: 31.3 pg (ref 26.0–34.0)
MCHC: 33.7 g/dL (ref 30.0–36.0)
MCV: 92.7 fL (ref 78.0–100.0)
MONOS PCT: 9 % (ref 3–12)
Monocytes Absolute: 0.7 10*3/uL (ref 0.1–1.0)
Neutro Abs: 5.7 10*3/uL (ref 1.7–7.7)
Neutrophils Relative %: 69 % (ref 43–77)
Platelets: 135 10*3/uL — ABNORMAL LOW (ref 150–400)
RBC: 4.8 MIL/uL (ref 4.22–5.81)
RDW: 12.5 % (ref 11.5–15.5)
WBC: 8.2 10*3/uL (ref 4.0–10.5)

## 2015-03-20 LAB — URINALYSIS, ROUTINE W REFLEX MICROSCOPIC
Bilirubin Urine: NEGATIVE
Glucose, UA: NEGATIVE mg/dL
Hgb urine dipstick: NEGATIVE
Ketones, ur: NEGATIVE mg/dL
LEUKOCYTES UA: NEGATIVE
Nitrite: NEGATIVE
PROTEIN: NEGATIVE mg/dL
Specific Gravity, Urine: 1.005 (ref 1.005–1.030)
Urobilinogen, UA: 0.2 mg/dL (ref 0.0–1.0)
pH: 5.5 (ref 5.0–8.0)

## 2015-03-20 LAB — I-STAT CG4 LACTIC ACID, ED: Lactic Acid, Venous: 1.37 mmol/L (ref 0.5–2.0)

## 2015-03-20 LAB — TROPONIN I: Troponin I: <0.03 ng/mL (ref <0.031)

## 2015-03-20 LAB — LIPASE, BLOOD: Lipase: 67 U/L — ABNORMAL HIGH (ref 22–51)

## 2015-03-20 MED ORDER — IOHEXOL 300 MG/ML  SOLN
100.0000 mL | Freq: Once | INTRAMUSCULAR | Status: AC | PRN
  Administered 2015-03-20: 100 mL via INTRAVENOUS

## 2015-03-20 MED ORDER — RANITIDINE HCL 150 MG PO TABS: 150.0000 mg | ORAL_TABLET | Freq: Two times a day (BID) | ORAL | Status: DC

## 2015-03-20 MED ORDER — TRAMADOL HCL 50 MG PO TABS
50.0000 mg | ORAL_TABLET | Freq: Four times a day (QID) | ORAL | Status: DC | PRN
Start: 2015-03-20 — End: 2015-07-06

## 2015-03-20 NOTE — Discharge Instructions (Signed)

## 2015-03-20 NOTE — ED Notes (Signed)
Per pt sts chest pain since last month. sts ablation since before surgery. sts pain moves to all areas of chest. sts intermittent. sts hurts with palpation and when he lifted some heavy bags of concrete. sts nausea.

## 2015-03-20 NOTE — ED Provider Notes (Signed)
CSN: [id number]     Arrival date & time 03/20/15  1235 History   First MD Initiated Contact with Patient 03/20/15 1240     Chief Complaint  Patient presents with  . Chest Pain     (Consider location/radiation/quality/duration/timing/severity/associated sxs/prior Treatment) HPI Comments: Patient presents to the ER for evaluation of upper abdominal/chest pain. Patient reports that the pain comes and goes. He has been having a lot last month, but then it went away. Now in the last few days it has returned. He reports that it seems to improve at night, worse during the day. He is noticing that the pain will come and go, sometimes in the center of his upper abdomens, sometimes to one side or the other. No shortness of breath. His blood sugars have been running high for him, around 200.  Patient is a 69 y.o. male presenting with chest pain.  Chest Pain Associated symptoms: abdominal pain     Past Medical History  Diagnosis Date  . CAD (coronary artery disease)     s/p 3V CABG 2001  . HTN (hypertension)   . Hyperlipidemia   . Diabetes mellitus   . Tobacco abuse, in remission   . PVC (premature ventricular contraction)     a. s/p ablation   Past Surgical History  Procedure Laterality Date  . Cholecystectomy    . Neck surgery    . Coronary artery bypass graft    . Pineal cyst removal    . Bone spur removal right shoulder    . V-tach ablation N/A 02/15/2015    PVC ablation by Dr Lovena Le   Family History  Problem Relation Age of Onset  . Heart attack Father 3  . Diabetes Father   . Heart attack Paternal Grandfather 74  . Diabetes Mother    History  Substance Use Topics  . Smoking status: Former Smoker -- 3.00 packs/day for 35 years    Types: Cigarettes    Quit date: 09/16/2000  . Smokeless tobacco: Not on file  . Alcohol Use: No    Review of Systems  Cardiovascular: Positive for chest pain.  Gastrointestinal: Positive for abdominal pain.  All other systems reviewed and  are negative.     Allergies  Review of patient's allergies indicates no known allergies.  Home Medications   Prior to Admission medications   Medication Sig Start Date End Date Taking? Authorizing Provider  aspirin 81 MG tablet Take 81 mg by mouth daily.   Yes Historical Provider, MD  atorvastatin (LIPITOR) 10 MG tablet Take 10 mg by mouth every evening.  02/22/15  Yes Historical Provider, MD  donepezil (ARICEPT) 5 MG tablet Take 5 mg by mouth daily.  11/10/14  Yes Historical Provider, MD  levothyroxine (SYNTHROID, LEVOTHROID) 88 MCG tablet Take 88 mcg by mouth daily before breakfast.  10/22/14  Yes Historical Provider, MD  metFORMIN (GLUCOPHAGE) 1000 MG tablet Take 1,000 mg by mouth 2 (two) times daily with a meal.    Yes Historical Provider, MD  metoprolol (LOPRESSOR) 50 MG tablet Take 25 mg by mouth 2 (two) times daily.  10/03/14  Yes Historical Provider, MD  omeprazole (PRILOSEC OTC) 20 MG tablet Take 20 mg by mouth daily.   Yes Historical Provider, MD  ONE TOUCH ULTRA TEST test strip daily. for testing 11/14/14  Yes Historical Provider, MD  vitamin B-12 (CYANOCOBALAMIN) 1000 MCG tablet Take 1,000 mcg by mouth daily.   Yes Historical Provider, MD   BP 127/87 mmHg  Pulse 49  Resp 21  SpO2 95% Physical Exam  Constitutional: He is oriented to person, place, and time. He appears well-developed and well-nourished. No distress.  HENT:  Head: Normocephalic and atraumatic.  Right Ear: Hearing normal.  Left Ear: Hearing normal.  Nose: Nose normal.  Mouth/Throat: Oropharynx is clear and moist and mucous membranes are normal.  Eyes: Conjunctivae and EOM are normal. Pupils are equal, round, and reactive to light.  Neck: Normal range of motion. Neck supple.  Cardiovascular: Regular rhythm, S1 normal and S2 normal.  Exam reveals no gallop and no friction rub.   No murmur heard. Pulmonary/Chest: Effort normal and breath sounds normal. No respiratory distress. He exhibits no tenderness.   Abdominal: Soft. Normal appearance and bowel sounds are normal. There is no hepatosplenomegaly. There is tenderness in the right upper quadrant, epigastric area and left upper quadrant. There is no rebound, no guarding, no tenderness at McBurney's point and negative Murphy's sign. No hernia.  Musculoskeletal: Normal range of motion.  Neurological: He is alert and oriented to person, place, and time. He has normal strength. No cranial nerve deficit or sensory deficit. Coordination normal. GCS eye subscore is 4. GCS verbal subscore is 5. GCS motor subscore is 6.  Skin: Skin is warm, dry and intact. No rash noted. No cyanosis.  Psychiatric: He has a normal mood and affect. His speech is normal and behavior is normal. Thought content normal.  Nursing note and vitals reviewed.   ED Course  Procedures (including critical care time) Labs Review Labs Reviewed  CBC WITH DIFFERENTIAL/PLATELET - Abnormal; Notable for the following:    Platelets 135 (*)    All other components within normal limits  COMPREHENSIVE METABOLIC PANEL - Abnormal; Notable for the following:    Chloride 100 (*)    Glucose, Bld 166 (*)    GFR calc non Af Amer 59 (*)    All other components within normal limits  LIPASE, BLOOD - Abnormal; Notable for the following:    Lipase 67 (*)    All other components within normal limits  URINALYSIS, ROUTINE W REFLEX MICROSCOPIC  TROPONIN I  I-STAT CG4 LACTIC ACID, ED    Imaging Review Ct Abdomen Pelvis W Contrast  03/20/2015   CLINICAL DATA:  Epigastric pain  EXAM: CT ABDOMEN AND PELVIS WITH CONTRAST  TECHNIQUE: Multidetector CT imaging of the abdomen and pelvis was performed using the standard protocol following bolus administration of intravenous contrast.  CONTRAST:  167mL OMNIPAQUE IOHEXOL 300 MG/ML  SOLN  COMPARISON:  None.  FINDINGS: Postcholecystectomy  Mild diffuse hepatic steatosis without focal mass.  Spleen, pancreas, adrenal glands are within normal limits  Chronic changes  of the kidneys. Circumaortic left renal vein anatomy.  Tiny fat density areas in the duodenum may represent small lipomas. See images 37 and 42.  Mild atherosclerotic changes of the aorta and iliac arteries.  There is stranding in the subcutaneous fat of the right inguinal region consistent with recent cardiac catheterization no overt hematoma. No evidence of pseudoaneurysm.  Bladder and prostate are unremarkable.  Normal appendix.  No free-fluid.  No abnormal adenopathy.  IMPRESSION: No acute intra-abdominal pathology.   Electronically Signed   By: Marybelle Killings M.D.   On: 03/20/2015 15:07     EKG Interpretation   Date/Time:  Saturday Mar 20 2015 12:42:29 EDT Ventricular Rate:  58 PR Interval:  158 QRS Duration: 107 QT Interval:  448 QTC Calculation: 440 R Axis:   -10 Text Interpretation:  Sinus rhythm Multiform ventricular premature  complexes Low voltage, precordial leads Baseline wander in lead(s) II III  aVF Confirmed by POLLINA  MD, CHRISTOPHER [phone]) on 03/20/2015 12:47:35 PM      MDM   Final diagnoses:  Abdominal pain   abdominal pain  Patient presents to the ER for evaluation of epigastric pain. Patient reports that he has been having intermittent episodes of this pain over the past month or so. It resolved on its own, but then started to happen again in the last couple of days. He points to the upper abdominal area, subxiphoid region. This area is tender to palpation. He has diffuse tenderness in the right upper, epigastric and left upper abdomen without guarding or rebound. There is no chest pain. Patient's EKG unremarkable, troponin negative. Because the pain is primarily abdominal any is tender, CT scan was performed. He does not have a gallbladder. No acute abnormality is noted. He does have a very slight elevation of lipase, but his pancreas was normal on CT scan. Patient will be treated with analgesia, follow up with PCP. Return if symptoms worsen.    Orpah Greek, MD 03/20/15 1537

## 2015-04-09 ENCOUNTER — Encounter: Payer: Self-pay | Admitting: Internal Medicine

## 2015-04-15 ENCOUNTER — Encounter: Payer: Self-pay | Admitting: Cardiovascular Disease

## 2015-04-15 ENCOUNTER — Ambulatory Visit (INDEPENDENT_AMBULATORY_CARE_PROVIDER_SITE_OTHER): Payer: Medicare Other | Admitting: Cardiovascular Disease

## 2015-04-15 VITALS — BP 110/70 | HR 61 | Ht 66.0 in | Wt 213.0 lb

## 2015-04-15 DIAGNOSIS — E785 Hyperlipidemia, unspecified: Secondary | ICD-10-CM

## 2015-04-15 DIAGNOSIS — I493 Ventricular premature depolarization: Secondary | ICD-10-CM

## 2015-04-15 DIAGNOSIS — F17201 Nicotine dependence, unspecified, in remission: Secondary | ICD-10-CM | POA: Diagnosis not present

## 2015-04-15 DIAGNOSIS — I10 Essential (primary) hypertension: Secondary | ICD-10-CM | POA: Diagnosis not present

## 2015-04-15 DIAGNOSIS — I2510 Atherosclerotic heart disease of native coronary artery without angina pectoris: Secondary | ICD-10-CM

## 2015-04-15 NOTE — Progress Notes (Signed)
Chief Complaint  Patient presents with  . Nausea     History of Present Illness: Mr. Pieper is a 69 yo Caucasian male with past medical history significant for coronary artery disease status post 3-vessel coronary artery bypass grafting surgery in 2001, hypertension, diabetes mellitus and hyperlipidemia as well as remote tobacco abuse who returns today for cardiac follow up. His last cath was in December 2001 at which time his LAD shut down during attempted rotablator atherectomy leading to 3V CABG. Stress test October 2010 with no ischemia, normal LVEF. Echo October 2010 with normal LV size and function. I saw him December 03, 2014 and he c/o worsened fatigue. Stress myoview 12/08/14 with no evidence of ischemia. He was referred to Dr Lovena Le for symptomatic bigeminal PVC's. He underwent ablation on 02/15/15.   He is here today for follow up. He denies any chest discomfort, palpitations, dizziness, near syncope, syncope, orthopnea, PND, or lower extremity edema. He still reports daytime fatigue.   Primary Care Physician: Dr. Claris Gower  Last Lipid Profile: Followed in primary care.   Past Medical History  Diagnosis Date  . CAD (coronary artery disease)     s/p 3V CABG 2001  . HTN (hypertension)   . Hyperlipidemia   . Diabetes mellitus   . Tobacco abuse, in remission   . PVC (premature ventricular contraction)     a. s/p ablation    Past Surgical History  Procedure Laterality Date  . Cholecystectomy    . Neck surgery    . Coronary artery bypass graft    . Pineal cyst removal    . Bone spur removal right shoulder    . V-tach ablation N/A 02/15/2015    PVC ablation by Dr Lovena Le    Current Outpatient Prescriptions  Medication Sig Dispense Refill  . aspirin 81 MG tablet Take 81 mg by mouth daily.    Marland Kitchen atorvastatin (LIPITOR) 10 MG tablet Take 10 mg by mouth every evening.   0  . donepezil (ARICEPT) 5 MG tablet Take 5 mg by mouth daily.   1  . glimepiride (AMARYL) 4 MG tablet  Take 4 mg by mouth daily with breakfast. Pt. Takes 1/2 tablet daily with breakfas  0  . levothyroxine (SYNTHROID, LEVOTHROID) 88 MCG tablet Take 88 mcg by mouth daily before breakfast.   0  . metFORMIN (GLUCOPHAGE) 1000 MG tablet Take 1,000 mg by mouth 2 (two) times daily with a meal.     . metoprolol (LOPRESSOR) 50 MG tablet Take 25 mg by mouth 2 (two) times daily.   0  . ONE TOUCH ULTRA TEST test strip daily. for testing  0  . pantoprazole (PROTONIX) 40 MG tablet Take 40 mg by mouth daily.  0  . ranitidine (ZANTAC) 150 MG tablet Take 1 tablet (150 mg total) by mouth 2 (two) times daily. (Patient taking differently: Take 150 mg by mouth once. ) 60 tablet 0  . traMADol (ULTRAM) 50 MG tablet Take 1 tablet (50 mg total) by mouth every 6 (six) hours as needed. (Patient taking differently: Take 50 mg by mouth every 6 (six) hours as needed (pain). ) 15 tablet 0  . vitamin B-12 (CYANOCOBALAMIN) 1000 MCG tablet Take 1,000 mcg by mouth daily.     No current facility-administered medications for this visit.    No Known Allergies  History   Social History  . Marital Status: Married    Spouse Name: N/A  . Number of Children: 4  . Years of  Education: N/A   Occupational History  . Retired Administrator    Social History Main Topics  . Smoking status: Former Smoker -- 3.00 packs/day for 35 years    Types: Cigarettes    Quit date: 09/16/2000  . Smokeless tobacco: Not on file  . Alcohol Use: No  . Drug Use: No  . Sexual Activity: Not on file   Other Topics Concern  . Not on file   Social History Narrative    Family History  Problem Relation Age of Onset  . Heart attack Father 103  . Diabetes Father   . Heart attack Paternal Grandfather 74  . Diabetes Mother     Review of Systems:  As stated in the HPI and otherwise negative.   BP 110/70 mmHg  Pulse 61  Ht 5\' 6"  (1.676 m)  Wt 213 lb (96.616 kg)  BMI 34.40 kg/m2  Physical Examination: General: Well developed, well nourished,  NAD HEENT: OP clear, mucus membranes moist SKIN: warm, dry. No rashes. Neuro: No focal deficits Musculoskeletal: Muscle strength 5/5 all ext Psychiatric: Mood and affect normal Neck: No JVD, no carotid bruits, no thyromegaly, no lymphadenopathy. Lungs:Clear bilaterally, no wheezes, rhonci, crackles Cardiovascular: Loletha Grayer, Regular rhythm. No murmurs, gallops or rubs. Abdomen:Soft. Bowel sounds present. Non-tender.  Extremities: No lower extremity edema. Pulses are 2 + in the bilateral DP/PT.  Cardiac cath December 2001:  1. Left main trunk: The left main trunk is a large caliber vessel with a  distal narrowing of 20%.  2. Left anterior descending: This is a large caliber vessel that provides  a diagonal branch in the mid section as well as several septal perforators  in the proximal and mid section. The LAD has a long eccentric narrowing  of 50% in the mid section prior to the diagonal branch and a focal  high-grade narrowing of 90% distally in the LAD immediately after the  takeoff of the diagonal branch. The distal LAD has mild diffuse disease  as of the diagonal branch.  3. Left circumflex artery: This is a large caliber vessel that provides a  medium caliber first marginal branch in the proximal segment and a  larger second marginal branch distally. The AV circumflex has a narrowing  of 30% between the first and second marginal branch. The first marginal  branch has a narrowing of 60% in the proximal segment.  4. Right coronary artery: The right coronary artery is dominant. This is a  large caliber vessel that provides a posterior descending artery and a  small posterior ventricular branch in its terminal segment. There is an  ostial narrowing of 50% within the area of previous stenting. The  remainder of the stented segment in the proximal and mid section of the  vessel has mild disease of 20%. The ostium of the posterior descending  artery has a focal narrowing of 70%.  Echo  11/17/13: Left ventricle: The cavity size was normal. Wall thickness was increased in a pattern of mild LVH. There was moderate focal basal hypertrophy. Systolic function was normal. The estimated ejection fraction was in the range of 55% to 60%. Wall motion was normal; there were no regional wall motion abnormalities. - Left atrium: The atrium was mildly dilated. - Right ventricle: The cavity size was mildly dilated. Wall thickness was normal.  Stress myoview 12/08/14: Stress Procedure: The patient received IV Lexiscan 0.4 mg over 15-seconds. Technetium 66m Sestamibi injected at 30-seconds. Quantitative spect images were obtained after a 45 minute delay. Stress  ECG: There are scattered PVCs.  QPS Raw Data Images: Mild diaphragmatic attenuation. Normal left ventricular size. Stress Images: Fixed apical and apicoseptal defect, mild inferior attenuation Rest Images: Fixed apical defect, mild inferior attenuation Subtraction (SDS): 2 Transient Ischemic Dilatation (Normal <1.22): 1.12 Lung/Heart Ratio (Normal <0.45): 0.33  Quantitative Gated Spect Images QGS EDV: NA ml QGS ESV: NA ml  Impression Exercise Capacity: Lexiscan with no exercise. BP Response: Normal blood pressure response. Clinical Symptoms: No significant symptoms noted. ECG Impression: There are scattered PVCs. Comparison with Prior Nuclear Study: Prior study shows inferior scar  Overall Impression: Low risk stress nuclear study with moderate intensity, small, mostly fixed apical and apical septal defects. Mild diaphragmatic attenuation artifact in the infrerior base, no significant reversible ischemia.  LV Ejection Fraction: Study not gated. LV Wall Motion: N/A   EKG:  EKG is not ordered today. The ekg ordered today demonstrates   Recent Labs: 03/20/2015: ALT 30; BUN 8; Creatinine, Ser 1.23; Hemoglobin 15.0; Platelets 135*; Potassium 4.3; Sodium 135   Lipid Panel No results found for:  CHOL, TRIG, HDL, CHOLHDL, VLDL, LDLCALC, LDLDIRECT   Wt Readings from Last 3 Encounters:  04/15/15 213 lb (96.616 kg)  03/15/15 213 lb 1.9 oz (96.671 kg)  02/16/15 205 lb 14.6 oz (93.4 kg)     Other studies Reviewed: Additional studies/ records that were reviewed today include: . Review of the above records demonstrates:    Assessment and Plan:   1. CAD s/p CABG: Stable. Recent stress test without ischemia. Continue ASA and statin.   2. HTN: BP is normal. No changes  3. Tobacco abuse, in remission: He stopped smoking in 2001.   4. Hyperlipidemia:  Followed in primary care.   5. PVCs/ventricular bigeminy: s/p ablation. Followed by Dr. Lovena Le.   Current medicines are reviewed at length with the patient today.  The patient does not have concerns regarding medicines.  The following changes have been made:  no change  Labs/ tests ordered today include:  No orders of the defined types were placed in this encounter.    Disposition:   FU with me in 6 months  Signed, Lauree Chandler, MD 04/15/2015 6:15 PM    [facility] [place], [place], [place]  [address] Phone: [phone]; Fax: [phone]

## 2015-04-15 NOTE — Patient Instructions (Signed)
Medication Instructions:  Your physician recommends that you continue on your current medications as directed. Please refer to the Current Medication list given to you today.   Labwork: none  Testing/Procedures: none  Follow-Up: Your physician wants you to follow-up in: 6 months.  You will receive a reminder letter in the mail two months in advance. If you don't receive a letter, please call our office to schedule the follow-up appointment.       

## 2015-06-10 ENCOUNTER — Ambulatory Visit: Payer: Medicare Other | Admitting: Internal Medicine

## 2015-07-06 ENCOUNTER — Ambulatory Visit (INDEPENDENT_AMBULATORY_CARE_PROVIDER_SITE_OTHER): Payer: Medicare Other | Admitting: Internal Medicine

## 2015-07-06 ENCOUNTER — Encounter: Payer: Self-pay | Admitting: Internal Medicine

## 2015-07-06 VITALS — BP 130/82 | HR 51 | Ht 65.0 in | Wt 215.0 lb

## 2015-07-06 DIAGNOSIS — I10 Essential (primary) hypertension: Secondary | ICD-10-CM

## 2015-07-06 DIAGNOSIS — I493 Ventricular premature depolarization: Secondary | ICD-10-CM

## 2015-07-06 NOTE — Assessment & Plan Note (Signed)
His dense ventricular ectopy has resolved. He will continue his current meds. He is s/p successful PVC ablation.

## 2015-07-06 NOTE — Assessment & Plan Note (Signed)
His blood pressure is well controlled. He will continue his current meds. He is encouraged to increase his physical activity.

## 2015-07-06 NOTE — Progress Notes (Signed)
HPI [name] returns today for followup. He is a 69 yo man with frequent PVC's who underwent a PVC ablation several months ago. He describes a single episode of palpitations since. He denies chest pain or sob. No syncope.  No Known Allergies   Current Outpatient Prescriptions  Medication Sig Dispense Refill  . aspirin 81 MG tablet Take 81 mg by mouth daily.    Marland Kitchen atorvastatin (LIPITOR) 10 MG tablet Take 10 mg by mouth every evening.   0  . donepezil (ARICEPT) 5 MG tablet Take 5 mg by mouth daily.   1  . levothyroxine (SYNTHROID, LEVOTHROID) 88 MCG tablet Take 88 mcg by mouth daily before breakfast.   0  . metFORMIN (GLUCOPHAGE) 1000 MG tablet Take 1,000 mg by mouth 2 (two) times daily with a meal.     . NITROSTAT 0.4 MG SL tablet Place 0.4 mg under the tongue every 5 (five) minutes as needed for chest pain.   0  . pantoprazole (PROTONIX) 40 MG tablet Take 40 mg by mouth daily.  0  . vitamin B-12 (CYANOCOBALAMIN) 1000 MCG tablet Take 1,000 mcg by mouth daily.    . metoprolol (LOPRESSOR) 50 MG tablet Take 25 mg by mouth 2 (two) times daily.   0  . ONE TOUCH ULTRA TEST test strip daily. for testing  0   No current facility-administered medications for this visit.     Past Medical History  Diagnosis Date  . CAD (coronary artery disease)     s/p 3V CABG 2001  . HTN (hypertension)   . Hyperlipidemia   . Diabetes mellitus   . Tobacco abuse, in remission   . PVC (premature ventricular contraction)     a. s/p ablation    ROS:   All systems reviewed and negative except as noted in the HPI.   Past Surgical History  Procedure Laterality Date  . Cholecystectomy    . Neck surgery    . Coronary artery bypass graft    . Pineal cyst removal    . Bone spur removal right shoulder    . V-tach ablation N/A 02/15/2015    PVC ablation by Dr Lovena Le     Family History  Problem Relation Age of Onset  . Heart attack Father 46  . Diabetes Father   . Heart attack Paternal  Grandfather 74  . Diabetes Mother      Social History   Social History  . Marital Status: Married    Spouse Name: N/A  . Number of Children: 4  . Years of Education: N/A   Occupational History  . Retired Administrator    Social History Main Topics  . Smoking status: Former Smoker -- 3.00 packs/day for 35 years    Types: Cigarettes    Quit date: 09/16/2000  . Smokeless tobacco: Not on file  . Alcohol Use: No  . Drug Use: No  . Sexual Activity: Not on file   Other Topics Concern  . Not on file   Social History Narrative     BP 130/82 mmHg  Pulse 51  Ht 5\' 5"  (1.651 m)  Wt 215 lb (97.523 kg)  BMI 35.78 kg/m2  Physical Exam:  Well appearing NAD HEENT: Unremarkable Neck:  No JVD, no thyromegally Lymphatics:  No adenopathy Back:  No CVA tenderness Lungs:  Clear with no wheezes HEART:  Regular rate rhythm, no murmurs, no rubs, no clicks Abd:  soft, positive bowel sounds, no organomegally, no rebound,  no guarding Ext:  2 plus pulses, no edema, no cyanosis, no clubbing Skin:  No rashes no nodules Neuro:  CN II through XII intact, motor grossly intact  EKG - NSR  Assess/Plan:

## 2015-07-06 NOTE — Patient Instructions (Signed)
Medication Instructions:  Your physician recommends that you continue on your current medications as directed. Please refer to the Current Medication list given to you today.   Labwork: None ordered   Testing/Procedures: None ordered   Follow-Up: Your physician recommends that you schedule a follow-up appointment as needed    Any Other Special Instructions Will Be Listed Below (If Applicable).   

## 2015-11-18 ENCOUNTER — Ambulatory Visit (INDEPENDENT_AMBULATORY_CARE_PROVIDER_SITE_OTHER): Payer: Medicare Other | Admitting: Cardiovascular Disease

## 2015-11-18 ENCOUNTER — Encounter: Payer: Self-pay | Admitting: Cardiovascular Disease

## 2015-11-18 VITALS — BP 118/70 | HR 52 | Ht 65.0 in | Wt 217.4 lb

## 2015-11-18 DIAGNOSIS — F17201 Nicotine dependence, unspecified, in remission: Secondary | ICD-10-CM | POA: Diagnosis not present

## 2015-11-18 DIAGNOSIS — I493 Ventricular premature depolarization: Secondary | ICD-10-CM

## 2015-11-18 DIAGNOSIS — I10 Essential (primary) hypertension: Secondary | ICD-10-CM

## 2015-11-18 DIAGNOSIS — I2510 Atherosclerotic heart disease of native coronary artery without angina pectoris: Secondary | ICD-10-CM

## 2015-11-18 DIAGNOSIS — E785 Hyperlipidemia, unspecified: Secondary | ICD-10-CM

## 2015-11-18 NOTE — Progress Notes (Signed)
Chief Complaint  Patient presents with  . Coronary Artery Disease    pt c/o being tired      History of Present Illness: [name] [name] is a 70 yo Caucasian male with past medical history significant for coronary artery disease status post 3-vessel coronary artery bypass grafting surgery in 2001, hypertension, diabetes mellitus and hyperlipidemia as well as remote tobacco abuse who returns today for cardiac follow up. His last cath was in December 2001 at which time his LAD shut down during attempted rotablator atherectomy leading to 3V CABG. Stress test October 2010 with no ischemia, normal LVEF. Echo October 2010 with normal LV size and function. I saw him December 03, 2014 and he c/o worsened fatigue. Stress myoview 12/08/14 with no evidence of ischemia. He was referred to Dr Lovena Le for symptomatic bigeminal PVC's. He underwent ablation on 02/15/15.   He is here today for follow up. He denies any chest discomfort, palpitations, dizziness, near syncope, syncope, orthopnea, PND, or lower extremity edema. He still reports daytime fatigue.   Primary Care Physician: Dr. Claris Gower  Last Lipid Profile: Followed in primary care.   Past Medical History  Diagnosis Date  . CAD (coronary artery disease)     s/p 3V CABG 2001  . HTN (hypertension)   . Hyperlipidemia   . Diabetes mellitus ([place])   . Tobacco abuse, in remission   . PVC (premature ventricular contraction)     a. s/p ablation    Past Surgical History  Procedure Laterality Date  . Cholecystectomy    . Neck surgery    . Coronary artery bypass graft    . Pineal cyst removal    . Bone spur removal right shoulder    . V-tach ablation N/A 02/15/2015    PVC ablation by Dr Lovena Le    Current Outpatient Prescriptions  Medication Sig Dispense Refill  . aspirin 81 MG tablet Take 81 mg by mouth daily.    Marland Kitchen atorvastatin (LIPITOR) 10 MG tablet Take 10 mg by mouth every evening.   0  . glimepiride (AMARYL) 4 MG tablet Take 0.5 tablets by  mouth daily. Pt takes 2 mg daily  0  . levothyroxine (SYNTHROID, LEVOTHROID) 88 MCG tablet Take 88 mcg by mouth daily before breakfast.   0  . metFORMIN (GLUCOPHAGE) 1000 MG tablet Take 1,000 mg by mouth 2 (two) times daily with a meal.     . metoprolol (LOPRESSOR) 50 MG tablet Take 25 mg by mouth 2 (two) times daily.   0  . NITROSTAT 0.4 MG SL tablet Place 0.4 mg under the tongue every 5 (five) minutes as needed for chest pain.   0  . ONE TOUCH ULTRA TEST test strip daily. for testing  0  . pantoprazole (PROTONIX) 40 MG tablet Take 40 mg by mouth daily.  0  . vitamin B-12 (CYANOCOBALAMIN) 1000 MCG tablet Take 1,000 mcg by mouth daily.     No current facility-administered medications for this visit.    No Known Allergies  Social History   Social History  . Marital Status: Married    Spouse Name: N/A  . Number of Children: 4  . Years of Education: N/A   Occupational History  . Retired Administrator    Social History Main Topics  . Smoking status: Former Smoker -- 3.00 packs/day for 35 years    Types: Cigarettes    Quit date: 09/16/2000  . Smokeless tobacco: Not on file  . Alcohol Use: No  .  Drug Use: No  . Sexual Activity: Not on file   Other Topics Concern  . Not on file   Social History Narrative    Family History  Problem Relation Age of Onset  . Heart attack Father 75  . Diabetes Father   . Heart attack Paternal Grandfather 74  . Diabetes Mother     Review of Systems:  As stated in the HPI and otherwise negative.   BP 118/70 mmHg  Pulse 52  Ht 5\' 5"  (1.651 m)  Wt 217 lb 6.4 oz (98.612 kg)  BMI 36.18 kg/m2  SpO2 98%  Physical Examination: General: Well developed, well nourished, NAD HEENT: OP clear, mucus membranes moist SKIN: warm, dry. No rashes. Neuro: No focal deficits Musculoskeletal: Muscle strength 5/5 all ext Psychiatric: Mood and affect normal Neck: No JVD, no carotid bruits, no thyromegaly, no lymphadenopathy. Lungs:Clear bilaterally, no  wheezes, rhonci, crackles Cardiovascular: Loletha Grayer, Regular rhythm. No murmurs, gallops or rubs. Abdomen:Soft. Bowel sounds present. Non-tender.  Extremities: No lower extremity edema. Pulses are 2 + in the bilateral DP/PT.  Cardiac cath December 2001:  1. Left main trunk: The left main trunk is a large caliber vessel with a  distal narrowing of 20%.  2. Left anterior descending: This is a large caliber vessel that provides  a diagonal branch in the mid section as well as several septal perforators  in the proximal and mid section. The LAD has a long eccentric narrowing  of 50% in the mid section prior to the diagonal branch and a focal  high-grade narrowing of 90% distally in the LAD immediately after the  takeoff of the diagonal branch. The distal LAD has mild diffuse disease  as of the diagonal branch.  3. Left circumflex artery: This is a large caliber vessel that provides a  medium caliber first marginal branch in the proximal segment and a  larger second marginal branch distally. The AV circumflex has a narrowing  of 30% between the first and second marginal branch. The first marginal  branch has a narrowing of 60% in the proximal segment.  4. Right coronary artery: The right coronary artery is dominant. This is a  large caliber vessel that provides a posterior descending artery and a  small posterior ventricular branch in its terminal segment. There is an  ostial narrowing of 50% within the area of previous stenting. The  remainder of the stented segment in the proximal and mid section of the  vessel has mild disease of 20%. The ostium of the posterior descending  artery has a focal narrowing of 70%.  Echo 11/17/13: Left ventricle: The cavity size was normal. Wall thickness was increased in a pattern of mild LVH. There was moderate focal basal hypertrophy. Systolic function was normal. The estimated ejection fraction was in the range of 55% to 60%. Wall motion was normal;  there were no regional wall motion abnormalities. - Left atrium: The atrium was mildly dilated. - Right ventricle: The cavity size was mildly dilated. Wall thickness was normal.  Stress myoview 12/08/14: Stress Procedure: The patient received IV Lexiscan 0.4 mg over 15-seconds. Technetium 31m Sestamibi injected at 30-seconds. Quantitative spect images were obtained after a 45 minute delay. Stress ECG: There are scattered PVCs.  QPS Raw Data Images: Mild diaphragmatic attenuation. Normal left ventricular size. Stress Images: Fixed apical and apicoseptal defect, mild inferior attenuation Rest Images: Fixed apical defect, mild inferior attenuation Subtraction (SDS): 2 Transient Ischemic Dilatation (Normal <1.22): 1.12 Lung/Heart Ratio (Normal <0.45): 0.33  Quantitative Gated Spect Images QGS EDV: NA ml QGS ESV: NA ml  Impression Exercise Capacity: Lexiscan with no exercise. BP Response: Normal blood pressure response. Clinical Symptoms: No significant symptoms noted. ECG Impression: There are scattered PVCs. Comparison with Prior Nuclear Study: Prior study shows inferior scar  Overall Impression: Low risk stress nuclear study with moderate intensity, small, mostly fixed apical and apical septal defects. Mild diaphragmatic attenuation artifact in the infrerior base, no significant reversible ischemia.  LV Ejection Fraction: Study not gated. LV Wall Motion: N/A   EKG:  EKG is not ordered today. The ekg ordered today demonstrates   Recent Labs: 03/20/2015: ALT 30; BUN 8; Creatinine, Ser 1.23; Hemoglobin 15.0; Platelets 135*; Potassium 4.3; Sodium 135   Lipid Panel No results found for: CHOL, TRIG, HDL, CHOLHDL, VLDL, LDLCALC, LDLDIRECT   Wt Readings from Last 3 Encounters:  11/18/15 217 lb 6.4 oz (98.612 kg)  07/06/15 215 lb (97.523 kg)  04/15/15 213 lb (96.616 kg)     Other studies Reviewed: Additional studies/ records that were reviewed today include:  . Review of the above records demonstrates:    Assessment and Plan:   1. CAD s/p CABG: Stable. No angina. Nuclear stress test without ischemia 2016. Continue ASA, beta blocker and statin.   2. HTN: BP is controlled. No changes  3. Tobacco abuse, in remission: He stopped smoking in 2001.   4. Hyperlipidemia:  Continue statin. Lipids followed in primary care.   5. PVCs/ventricular bigeminy: s/p ablation. Followed by Dr. Lovena Le.   Current medicines are reviewed at length with the patient today.  The patient does not have concerns regarding medicines.  The following changes have been made:  no change  Labs/ tests ordered today include:  No orders of the defined types were placed in this encounter.    Disposition:   FU with me in 12 months  Signed, Lauree Chandler, MD 11/18/2015 12:58 PM    [address], [place], [place]  [address] Phone: [phone]; Fax: [phone]

## 2015-11-18 NOTE — Patient Instructions (Signed)

## 2016-08-24 ENCOUNTER — Other Ambulatory Visit (HOSPITAL_COMMUNITY): Payer: Self-pay | Admitting: Family Medicine

## 2016-08-24 DIAGNOSIS — R1310 Dysphagia, unspecified: Secondary | ICD-10-CM

## 2016-08-25 ENCOUNTER — Encounter (HOSPITAL_COMMUNITY): Payer: Medicare Other

## 2016-09-05 ENCOUNTER — Ambulatory Visit (HOSPITAL_COMMUNITY)
Admission: RE | Admit: 2016-09-05 | Discharge: 2016-09-05 | Disposition: A | Discharge status: Home / Self Care | Payer: Medicare Other | Source: Ambulatory Visit | Attending: Family Medicine | Admitting: Family Medicine

## 2016-09-05 ENCOUNTER — Encounter (HOSPITAL_COMMUNITY): Payer: Medicare Other

## 2016-09-05 DIAGNOSIS — R1310 Dysphagia, unspecified: Secondary | ICD-10-CM | POA: Diagnosis present

## 2016-09-05 DIAGNOSIS — R1313 Dysphagia, pharyngeal phase: Secondary | ICD-10-CM | POA: Diagnosis not present

## 2016-11-01 ENCOUNTER — Encounter: Payer: Self-pay | Admitting: *Deleted

## 2016-11-22 ENCOUNTER — Ambulatory Visit: Payer: Medicare Other | Admitting: Cardiovascular Disease

## 2016-12-04 ENCOUNTER — Encounter: Payer: Self-pay | Admitting: Cardiovascular Disease

## 2016-12-04 ENCOUNTER — Ambulatory Visit (INDEPENDENT_AMBULATORY_CARE_PROVIDER_SITE_OTHER): Payer: Medicare Other | Admitting: Cardiovascular Disease

## 2016-12-04 VITALS — BP 150/70 | HR 55 | Ht 67.0 in | Wt 215.6 lb

## 2016-12-04 DIAGNOSIS — E7800 Pure hypercholesterolemia, unspecified: Secondary | ICD-10-CM | POA: Diagnosis not present

## 2016-12-04 DIAGNOSIS — I493 Ventricular premature depolarization: Secondary | ICD-10-CM

## 2016-12-04 DIAGNOSIS — I10 Essential (primary) hypertension: Secondary | ICD-10-CM

## 2016-12-04 DIAGNOSIS — I2510 Atherosclerotic heart disease of native coronary artery without angina pectoris: Secondary | ICD-10-CM

## 2016-12-04 NOTE — Progress Notes (Signed)
Chief Complaint  Patient presents with  . Follow-up     History of Present Illness: [name] is a 71 yo Caucasian male with past medical history significant for coronary artery disease status post 3-vessel coronary artery bypass grafting surgery in 2001, hypertension, diabetes mellitus and hyperlipidemia as well as remote tobacco abuse who returns today for cardiac follow up. His last cath was in December 2001 at which time his LAD shut down during attempted rotablator atherectomy leading to 3V CABG. Stress test October 2010 with no ischemia, normal LVEF. Echo October 2010 with normal LV size and function. I saw him December 03, 2014 and he c/o worsened fatigue. Stress myoview 12/08/14 with no evidence of ischemia. He was referred to Dr Lovena Le for symptomatic bigeminal PVC's. He underwent ablation on 02/15/15.   He is here today for follow up. He denies any chest discomfort, palpitations, dizziness, near syncope, syncope, orthopnea, PND, or lower extremity edema. He still reports daytime fatigue.  Primary Care Physician: Leonard Downing, MD  Past Medical History:  Diagnosis Date  . CAD (coronary artery disease)    s/p 3V CABG 2001  . Diabetes mellitus ([place])   . HTN (hypertension)   . Hyperlipidemia   . PVC (premature ventricular contraction)    a. s/p ablation  . Tobacco abuse, in remission     Past Surgical History:  Procedure Laterality Date  . Bone spur removal right shoulder    . CHOLECYSTECTOMY    . CORONARY ARTERY BYPASS GRAFT    . NECK SURGERY    . Pineal cyst removal    . V-TACH ABLATION N/A 02/15/2015   PVC ablation by Dr Lovena Le    Current Outpatient Prescriptions  Medication Sig Dispense Refill  . aspirin 81 MG tablet Take 81 mg by mouth daily.    Marland Kitchen atorvastatin (LIPITOR) 10 MG tablet Take 10 mg by mouth every evening.   0  . glimepiride (AMARYL) 4 MG tablet Take 0.5 tablets by mouth daily. Pt takes 2 mg daily  0  . levothyroxine (SYNTHROID, LEVOTHROID) 88 MCG  tablet Take 88 mcg by mouth daily before breakfast.   0  . metFORMIN (GLUCOPHAGE) 1000 MG tablet Take 1,000 mg by mouth 2 (two) times daily with a meal.     . metoprolol (LOPRESSOR) 50 MG tablet Take 25 mg by mouth 2 (two) times daily.   0  . NITROSTAT 0.4 MG SL tablet Place 0.4 mg under the tongue every 5 (five) minutes as needed for chest pain.   0  . ONE TOUCH ULTRA TEST test strip daily. for testing  0  . pantoprazole (PROTONIX) 40 MG tablet Take 40 mg by mouth daily.  0  . vitamin B-12 (CYANOCOBALAMIN) 1000 MCG tablet Take 1,000 mcg by mouth daily.     No current facility-administered medications for this visit.     No Known Allergies  Social History   Social History  . Marital status: Married    Spouse name: N/A  . Number of children: 4  . Years of education: N/A   Occupational History  . Retired Administrator    Social History Main Topics  . Smoking status: Former Smoker    Packs/day: 3.00    Years: 35.00    Types: Cigarettes    Quit date: 09/16/2000  . Smokeless tobacco: Never Used  . Alcohol use No  . Drug use: No  . Sexual activity: Not on file   Other Topics Concern  . Not on  file   Social History Narrative  . No narrative on file    Family History  Problem Relation Age of Onset  . Heart attack Father 6  . Diabetes Father   . Diabetes Mother   . Heart attack Paternal Grandfather 74    Review of Systems:  As stated in the HPI and otherwise negative.   BP (!) 150/70   Pulse (!) 55   Ht 5\' 7"  (1.702 m)   Wt 215 lb 9.6 oz (97.8 kg)   SpO2 98%   BMI 33.77 kg/m   Physical Examination: General: Well developed, well nourished, NAD  HEENT: OP clear, mucus membranes moist  SKIN: warm, dry. No rashes. Neuro: No focal deficits  Musculoskeletal: Muscle strength 5/5 all ext  Psychiatric: Mood and affect normal  Neck: No JVD, no carotid bruits, no thyromegaly, no lymphadenopathy.  Lungs:Clear bilaterally, no wheezes, rhonci, crackles Cardiovascular:  Loletha Grayer, Regular rhythm. No murmurs, gallops or rubs. Abdomen:Soft. Bowel sounds present. Non-tender.  Extremities: No lower extremity edema. Pulses are 2 + in the bilateral DP/PT.  Cardiac cath December 2001:  1. Left main trunk: The left main trunk is a large caliber vessel with a  distal narrowing of 20%.  2. Left anterior descending: This is a large caliber vessel that provides  a diagonal branch in the mid section as well as several septal perforators  in the proximal and mid section. The LAD has a long eccentric narrowing  of 50% in the mid section prior to the diagonal branch and a focal  high-grade narrowing of 90% distally in the LAD immediately after the  takeoff of the diagonal branch. The distal LAD has mild diffuse disease  as of the diagonal branch.  3. Left circumflex artery: This is a large caliber vessel that provides a  medium caliber first marginal branch in the proximal segment and a  larger second marginal branch distally. The AV circumflex has a narrowing  of 30% between the first and second marginal branch. The first marginal  branch has a narrowing of 60% in the proximal segment.  4. Right coronary artery: The right coronary artery is dominant. This is a  large caliber vessel that provides a posterior descending artery and a  small posterior ventricular branch in its terminal segment. There is an  ostial narrowing of 50% within the area of previous stenting. The  remainder of the stented segment in the proximal and mid section of the  vessel has mild disease of 20%. The ostium of the posterior descending  artery has a focal narrowing of 70%.  Echo 11/17/13: Left ventricle: The cavity size was normal. Wall thickness was increased in a pattern of mild LVH. There was moderate focal basal hypertrophy. Systolic function was normal. The estimated ejection fraction was in the range of 55% to 60%. Wall motion was normal; there were no regional wall motion  abnormalities. - Left atrium: The atrium was mildly dilated. - Right ventricle: The cavity size was mildly dilated. Wall thickness was normal.  Stress myoview 12/08/14: Stress Procedure: The patient received IV Lexiscan 0.4 mg over 15-seconds. Technetium 71m Sestamibi injected at 30-seconds. Quantitative spect images were obtained after a 45 minute delay. Stress ECG: There are scattered PVCs.  QPS Raw Data Images: Mild diaphragmatic attenuation. Normal left ventricular size. Stress Images: Fixed apical and apicoseptal defect, mild inferior attenuation Rest Images: Fixed apical defect, mild inferior attenuation Subtraction (SDS): 2 Transient Ischemic Dilatation (Normal <1.22): 1.12 Lung/Heart Ratio (Normal <0.45): 0.33  Quantitative Gated Spect Images QGS EDV: NA ml QGS ESV: NA ml  Impression Exercise Capacity: Lexiscan with no exercise. BP Response: Normal blood pressure response. Clinical Symptoms: No significant symptoms noted. ECG Impression: There are scattered PVCs. Comparison with Prior Nuclear Study: Prior study shows inferior scar  Overall Impression: Low risk stress nuclear study with moderate intensity, small, mostly fixed apical and apical septal defects. Mild diaphragmatic attenuation artifact in the infrerior base, no significant reversible ischemia.  LV Ejection Fraction: Study not gated. LV Wall Motion: N/A   EKG:  EKG is ordered today. The ekg ordered today demonstrates sinus, rate 64 bpm with frequent PVCs. Bigeminy  Recent Labs: No results found for requested labs within last 8760 hours.   Lipid Panel No results found for: CHOL, TRIG, HDL, CHOLHDL, VLDL, LDLCALC, LDLDIRECT   Wt Readings from Last 3 Encounters:  12/04/16 215 lb 9.6 oz (97.8 kg)  11/18/15 217 lb 6.4 oz (98.6 kg)  07/06/15 215 lb (97.5 kg)     Other studies Reviewed: Additional studies/ records that were reviewed today include: . Review of the above records  demonstrates:    Assessment and Plan:   1. CAD s/p CABG: No chest pain suggestive of angina.  Nuclear stress test without ischemia 2016. Continue ASA, beta blocker and statin.   2. HTN: BP is controlled. No changes  3. Tobacco abuse, in remission: He stopped smoking in 2001.   4. Hyperlipidemia:  Continue statin. Lipids followed in primary care.   5. PVCs/ventricular bigeminy: s/p ablation April 2016. He is having recurrent PVCs. He is asymptomatic. Followed by Dr. Lovena Le. Will continue Lopressor for now.   Current medicines are reviewed at length with the patient today.  The patient does not have concerns regarding medicines.  The following changes have been made:  no change  Labs/ tests ordered today include:   Orders Placed This Encounter  Procedures  . EKG 12-Lead    Disposition:   FU with me in 6 months  Signed, Lauree Chandler, MD 12/04/2016 4:39 PM    [facility] [address], [place], [place]  [address] Phone: [phone]; Fax: [phone]

## 2016-12-04 NOTE — Patient Instructions (Signed)

## 2016-12-06 ENCOUNTER — Ambulatory Visit: Payer: Medicare Other | Admitting: Cardiovascular Disease

## 2017-05-15 NOTE — Progress Notes (Signed)
Chief Complaint  Patient presents with  . Follow-up    CAD    History of Present Illness: [name] [name] is a 71 yo male with history of CAD s/p 3V CABG in 2001, HTN, DM and HLD who is here today for cardiac follow up. His last cath was in December 2001 at which time his LAD shut down during attempted rotablator atherectomy leading to 3V CABG. Stress myoview 12/08/14 with no evidence of ischemia. He has undergone PVC ablation in April of 2016.   He is here today for follow up. The patient denies any chest pain, dyspnea, palpitations, lower extremity edema, orthopnea, PND, dizziness, near syncope or syncope.   Primary Care Physician: Leonard Downing, MD  Past Medical History:  Diagnosis Date  . CAD (coronary artery disease)    s/p 3V CABG 2001  . Diabetes mellitus ([place])   . HTN (hypertension)   . Hyperlipidemia   . PVC (premature ventricular contraction)    a. s/p ablation  . Tobacco abuse, in remission     Past Surgical History:  Procedure Laterality Date  . Bone spur removal right shoulder    . CHOLECYSTECTOMY    . CORONARY ARTERY BYPASS GRAFT    . NECK SURGERY    . Pineal cyst removal    . V-TACH ABLATION N/A 02/15/2015   PVC ablation by Dr Lovena Le    Current Outpatient Prescriptions  Medication Sig Dispense Refill  . aspirin 81 MG tablet Take 81 mg by mouth daily.    Marland Kitchen atorvastatin (LIPITOR) 10 MG tablet Take 10 mg by mouth every evening.   0  . glimepiride (AMARYL) 4 MG tablet Take 0.5 tablets by mouth daily. Pt takes 2 mg daily  0  . levothyroxine (SYNTHROID, LEVOTHROID) 88 MCG tablet Take 88 mcg by mouth daily before breakfast.   0  . metFORMIN (GLUCOPHAGE) 1000 MG tablet Take 1,000 mg by mouth 2 (two) times daily with a meal.     . metoprolol (LOPRESSOR) 50 MG tablet Take 25 mg by mouth 2 (two) times daily.   0  . NITROSTAT 0.4 MG SL tablet Place 0.4 mg under the tongue every 5 (five) minutes as needed for chest pain.   0  . ONE TOUCH ULTRA TEST test strip daily.  for testing  0  . pantoprazole (PROTONIX) 40 MG tablet Take 40 mg by mouth daily.  0  . vitamin B-12 (CYANOCOBALAMIN) 1000 MCG tablet Take 1,000 mcg by mouth daily.     No current facility-administered medications for this visit.     No Known Allergies  Social History   Social History  . Marital status: Married    Spouse name: N/A  . Number of children: 4  . Years of education: N/A   Occupational History  . Retired Administrator    Social History Main Topics  . Smoking status: Former Smoker    Packs/day: 3.00    Years: 35.00    Types: Cigarettes    Quit date: 09/16/2000  . Smokeless tobacco: Never Used  . Alcohol use No  . Drug use: No  . Sexual activity: Not on file   Other Topics Concern  . Not on file   Social History Narrative  . No narrative on file    Family History  Problem Relation Age of Onset  . Heart attack Father 81  . Diabetes Father   . Diabetes Mother   . Heart attack Paternal Grandfather 84    Review  of Systems:  As stated in the HPI and otherwise negative.   BP 120/68   Pulse (!) 48   Ht 5\' 7"  (1.702 m)   Wt 217 lb 12.8 oz (98.8 kg)   SpO2 98%   BMI 34.11 kg/m   Physical Examination:  General: Well developed, well nourished, NAD  HEENT: OP clear, mucus membranes moist  SKIN: warm, dry. No rashes. Neuro: No focal deficits  Musculoskeletal: Muscle strength 5/5 all ext  Psychiatric: Mood and affect normal  Neck: No JVD, no carotid bruits, no thyromegaly, no lymphadenopathy.  Lungs:Clear bilaterally, no wheezes, rhonci, crackles Cardiovascular: Regular rate and rhythm. No murmurs, gallops or rubs. Abdomen:Soft. Bowel sounds present. Non-tender.  Extremities: No lower extremity edema. Pulses are 2 + in the bilateral DP/PT.  Cardiac cath December 2001:  1. Left main trunk: The left main trunk is a large caliber vessel with a  distal narrowing of 20%.  2. Left anterior descending: This is a large caliber vessel that provides  a  diagonal branch in the mid section as well as several septal perforators  in the proximal and mid section. The LAD has a long eccentric narrowing  of 50% in the mid section prior to the diagonal branch and a focal  high-grade narrowing of 90% distally in the LAD immediately after the  takeoff of the diagonal branch. The distal LAD has mild diffuse disease  as of the diagonal branch.  3. Left circumflex artery: This is a large caliber vessel that provides a  medium caliber first marginal branch in the proximal segment and a  larger second marginal branch distally. The AV circumflex has a narrowing  of 30% between the first and second marginal branch. The first marginal  branch has a narrowing of 60% in the proximal segment.  4. Right coronary artery: The right coronary artery is dominant. This is a  large caliber vessel that provides a posterior descending artery and a  small posterior ventricular branch in its terminal segment. There is an  ostial narrowing of 50% within the area of previous stenting. The  remainder of the stented segment in the proximal and mid section of the  vessel has mild disease of 20%. The ostium of the posterior descending  artery has a focal narrowing of 70%.  Echo 11/17/13: Left ventricle: The cavity size was normal. Wall thickness was increased in a pattern of mild LVH. There was moderate focal basal hypertrophy. Systolic function was normal. The estimated ejection fraction was in the range of 55% to 60%. Wall motion was normal; there were no regional wall motion abnormalities. - Left atrium: The atrium was mildly dilated. - Right ventricle: The cavity size was mildly dilated. Wall thickness was normal.  EKG:  EKG is not ordered today. The ekg ordered today demonstrates   Recent Labs: No results found for requested labs within last 8760 hours.   Lipid Panel No results found for: CHOL, TRIG, HDL, CHOLHDL, VLDL, LDLCALC, LDLDIRECT   Wt  Readings from Last 3 Encounters:  05/16/17 217 lb 12.8 oz (98.8 kg)  12/04/16 215 lb 9.6 oz (97.8 kg)  11/18/15 217 lb 6.4 oz (98.6 kg)     Other studies Reviewed: Additional studies/ records that were reviewed today include: . Review of the above records demonstrates:    Assessment and Plan:   1. CAD s/p CABG: He has no chest pain suggestive of angina. Will continue ASA, statin and beta blocker.   2. HTN: BP is controlled. No  changes.   3. Tobacco abuse, in remission: He no longer smokes.   4. Hyperlipidemia:  Lipids followed in primary care. Continue statin. Will ask him to have labs sent over from primary care  5. PVCs/ventricular bigeminy: He is s/p PVC ablation in April 2016. No palpitations. Continue beta blocker.    Current medicines are reviewed at length with the patient today.  The patient does not have concerns regarding medicines.  The following changes have been made:  no change  Labs/ tests ordered today include:   No orders of the defined types were placed in this encounter.   Disposition:   FU with me in 12  months  Signed, Lauree Chandler, MD 05/16/2017 10:02 AM    [facility] [place], [place], [place]  [address] Phone: [phone]; Fax: [phone]

## 2017-05-16 ENCOUNTER — Encounter: Payer: Self-pay | Admitting: Cardiovascular Disease

## 2017-05-16 ENCOUNTER — Encounter (INDEPENDENT_AMBULATORY_CARE_PROVIDER_SITE_OTHER): Payer: Self-pay

## 2017-05-16 ENCOUNTER — Ambulatory Visit (INDEPENDENT_AMBULATORY_CARE_PROVIDER_SITE_OTHER): Payer: Medicare Other | Admitting: Cardiovascular Disease

## 2017-05-16 VITALS — BP 120/68 | HR 48 | Ht 67.0 in | Wt 217.8 lb

## 2017-05-16 DIAGNOSIS — I10 Essential (primary) hypertension: Secondary | ICD-10-CM | POA: Diagnosis not present

## 2017-05-16 DIAGNOSIS — I2510 Atherosclerotic heart disease of native coronary artery without angina pectoris: Secondary | ICD-10-CM | POA: Diagnosis not present

## 2017-05-16 DIAGNOSIS — E7800 Pure hypercholesterolemia, unspecified: Secondary | ICD-10-CM

## 2017-05-16 DIAGNOSIS — I493 Ventricular premature depolarization: Secondary | ICD-10-CM | POA: Diagnosis not present

## 2017-05-16 NOTE — Patient Instructions (Signed)

## 2018-06-22 NOTE — Progress Notes (Signed)
Chief Complaint  Patient presents with  . Follow-up    CAD    History of Present Illness: 72 yo male with history of CAD s/p 3V CABG in 2001, HTN, DM, PVCs and HLD who is here today for cardiac follow up. His last cath was in December 2001 at which time his LAD shut down during attempted rotablator atherectomy leading to 3V CABG. Stress myoview 12/08/14 with no evidence of ischemia. He has undergone PVC ablation in April of 2016.   He is here today for follow up. The patient denies any palpitations, lower extremity edema, orthopnea, PND, dizziness, near syncope or syncope. He has chronic dyspnea with exertion. No changes. He had severe chest pain for 24 hours about ten days ago. Then pain on/off for 2 days. No pain for last week.   Primary Care Physician: Leonard Downing, MD  Past Medical History:  Diagnosis Date  . CAD (coronary artery disease)    s/p 3V CABG 2001  . Diabetes mellitus ([place])   . HTN (hypertension)   . Hyperlipidemia   . PVC (premature ventricular contraction)    a. s/p ablation  . Tobacco abuse, in remission     Past Surgical History:  Procedure Laterality Date  . Bone spur removal right shoulder    . CHOLECYSTECTOMY    . CORONARY ARTERY BYPASS GRAFT    . NECK SURGERY    . Pineal cyst removal    . V-TACH ABLATION N/A 02/15/2015   PVC ablation by Dr Lovena Le    Current Outpatient Medications  Medication Sig Dispense Refill  . aspirin 81 MG tablet Take 81 mg by mouth daily.    Marland Kitchen atorvastatin (LIPITOR) 10 MG tablet Take 10 mg by mouth every evening.   0  . glimepiride (AMARYL) 4 MG tablet Take 0.5 tablets by mouth daily. Pt takes 2 mg daily  0  . JANUMET 50-1000 MG tablet Take 1 tablet by mouth 2 (two) times daily.  1  . levothyroxine (SYNTHROID, LEVOTHROID) 88 MCG tablet Take 88 mcg by mouth daily before breakfast.   0  . metoprolol (LOPRESSOR) 50 MG tablet Take 25 mg by mouth 2 (two) times daily.   0  . NITROSTAT 0.4 MG SL tablet Place 1 tablet (0.4 mg  total) under the tongue every 5 (five) minutes as needed for chest pain. 25 tablet 3  . ONE TOUCH ULTRA TEST test strip daily. for testing  0  . pantoprazole (PROTONIX) 40 MG tablet Take 40 mg by mouth daily.  0  . vitamin B-12 (CYANOCOBALAMIN) 1000 MCG tablet Take 1,000 mcg by mouth daily.     No current facility-administered medications for this visit.     No Known Allergies  Social History   Socioeconomic History  . Marital status: Married    Spouse name: Not on file  . Number of children: 4  . Years of education: Not on file  . Highest education level: Not on file  Occupational History  . Occupation: Retired Investment banker, operational  . Financial resource strain: Not on file  . Food insecurity:    Worry: Not on file    Inability: Not on file  . Transportation needs:    Medical: Not on file    Non-medical: Not on file  Tobacco Use  . Smoking status: Former Smoker    Packs/day: 3.00    Years: 35.00    Pack years: 105.00    Types: Cigarettes    Last  attempt to quit: 09/16/2000    Years since quitting: 17.7  . Smokeless tobacco: Never Used  Substance and Sexual Activity  . Alcohol use: No  . Drug use: No  . Sexual activity: Not on file  Lifestyle  . Physical activity:    Days per week: Not on file    Minutes per session: Not on file  . Stress: Not on file  Relationships  . Social connections:    Talks on phone: Not on file    Gets together: Not on file    Attends religious service: Not on file    Active member of club or organization: Not on file    Attends meetings of clubs or organizations: Not on file    Relationship status: Not on file  . Intimate partner violence:    Fear of current or ex partner: Not on file    Emotionally abused: Not on file    Physically abused: Not on file    Forced sexual activity: Not on file  Other Topics Concern  . Not on file  Social History Narrative  . Not on file    Family History  Problem Relation Age of Onset  .  Heart attack Father 58  . Diabetes Father   . Diabetes Mother   . Heart attack Paternal Grandfather 74    Review of Systems:  As stated in the HPI and otherwise negative.   BP 126/70   Pulse (!) 49   Ht 5\' 7"  (1.702 m)   Wt 203 lb 12.8 oz (92.4 kg)   SpO2 98%   BMI 31.92 kg/m   Physical Examination:  General: Well developed, well nourished, NAD  HEENT: OP clear, mucus membranes moist  SKIN: warm, dry. No rashes. Neuro: No focal deficits  Musculoskeletal: Muscle strength 5/5 all ext  Psychiatric: Mood and affect normal  Neck: No JVD, no carotid bruits, no thyromegaly, no lymphadenopathy.  Lungs:Clear bilaterally, no wheezes, rhonci, crackles Cardiovascular: Regular rate and rhythm. No murmurs, gallops or rubs. Abdomen:Soft. Bowel sounds present. Non-tender.  Extremities: No lower extremity edema. Pulses are 2 + in the bilateral DP/PT.  Cardiac cath December 2001:  1. Left main trunk: The left main trunk is a large caliber vessel with a  distal narrowing of 20%.  2. Left anterior descending: This is a large caliber vessel that provides  a diagonal branch in the mid section as well as several septal perforators  in the proximal and mid section. The LAD has a long eccentric narrowing  of 50% in the mid section prior to the diagonal branch and a focal  high-grade narrowing of 90% distally in the LAD immediately after the  takeoff of the diagonal branch. The distal LAD has mild diffuse disease  as of the diagonal branch.  3. Left circumflex artery: This is a large caliber vessel that provides a  medium caliber first marginal branch in the proximal segment and a  larger second marginal branch distally. The AV circumflex has a narrowing  of 30% between the first and second marginal branch. The first marginal  branch has a narrowing of 60% in the proximal segment.  4. Right coronary artery: The right coronary artery is dominant. This is a  large caliber vessel that provides a  posterior descending artery and a  small posterior ventricular branch in its terminal segment. There is an  ostial narrowing of 50% within the area of previous stenting. The  remainder of the stented segment in the proximal  and mid section of the  vessel has mild disease of 20%. The ostium of the posterior descending  artery has a focal narrowing of 70%.  Echo 11/17/13: Left ventricle: The cavity size was normal. Wall thickness was increased in a pattern of mild LVH. There was moderate focal basal hypertrophy. Systolic function was normal. The estimated ejection fraction was in the range of 55% to 60%. Wall motion was normal; there were no regional wall motion abnormalities. - Left atrium: The atrium was mildly dilated. - Right ventricle: The cavity size was mildly dilated. Wall thickness was normal.  EKG:  EKG is ordered today. The ekg ordered today demonstrates Sinus bradycardia, rate 49 bpm.  Recent Labs: No results found for requested labs within last 8760 hours.   Lipid Panel No results found for: CHOL, TRIG, HDL, CHOLHDL, VLDL, LDLCALC, LDLDIRECT   Wt Readings from Last 3 Encounters:  06/24/18 203 lb 12.8 oz (92.4 kg)  05/16/17 217 lb 12.8 oz (98.8 kg)  12/04/16 215 lb 9.6 oz (97.8 kg)     Other studies Reviewed: Additional studies/ records that were reviewed today include: . Review of the above records demonstrates:    Assessment and Plan:   1. CAD s/p CABG with angina: He had a severe episode of chest pain 10 days ago that lasted for 24 hours. Several episodes of chest pain over next two days but none over last week. Continue ASA, statin and beta blocker. Will arrange Nuclear stress test.   2. HTN: BP is well controlled. No changes.   3. Tobacco abuse, in remission: He has stopped smoking  4. Hyperlipidemia:  Lipids followed in primary care. Continue statin.   5. PVCs/ventricular bigeminy: He is s/p PVC ablation in April 2016. Continue beta  blocker  Current medicines are reviewed at length with the patient today.  The patient does not have concerns regarding medicines.  The following changes have been made:  no change  Labs/ tests ordered today include:   Orders Placed This Encounter  Procedures  . MYOCARDIAL PERFUSION IMAGING  . EKG 12-Lead    Disposition:   FU with me in 3  months  Signed, Lauree Chandler, MD 06/24/2018 10:45 AM    [facility] [place], [place], [place]  [address] Phone: [phone]; Fax: [phone]

## 2018-06-24 ENCOUNTER — Ambulatory Visit: Payer: Medicare Other | Admitting: Cardiovascular Disease

## 2018-06-24 ENCOUNTER — Encounter (INDEPENDENT_AMBULATORY_CARE_PROVIDER_SITE_OTHER): Payer: Self-pay

## 2018-06-24 ENCOUNTER — Encounter: Payer: Self-pay | Admitting: *Deleted

## 2018-06-24 ENCOUNTER — Encounter: Payer: Self-pay | Admitting: Cardiovascular Disease

## 2018-06-24 ENCOUNTER — Telehealth (HOSPITAL_COMMUNITY): Payer: Self-pay | Admitting: *Deleted

## 2018-06-24 VITALS — BP 126/70 | HR 49 | Ht 67.0 in | Wt 203.8 lb

## 2018-06-24 DIAGNOSIS — I10 Essential (primary) hypertension: Secondary | ICD-10-CM

## 2018-06-24 DIAGNOSIS — I493 Ventricular premature depolarization: Secondary | ICD-10-CM | POA: Diagnosis not present

## 2018-06-24 DIAGNOSIS — E7800 Pure hypercholesterolemia, unspecified: Secondary | ICD-10-CM

## 2018-06-24 DIAGNOSIS — I25118 Atherosclerotic heart disease of native coronary artery with other forms of angina pectoris: Secondary | ICD-10-CM

## 2018-06-24 MED ORDER — NITROSTAT 0.4 MG SL SUBL: 0.4000 mg | SUBLINGUAL_TABLET | SUBLINGUAL | 3 refills | Status: DC | PRN

## 2018-06-24 MED ORDER — NITROGLYCERIN 0.4 MG SL SUBL: 0.4000 mg | SUBLINGUAL_TABLET | SUBLINGUAL | 6 refills | Status: DC | PRN

## 2018-06-24 NOTE — Patient Instructions (Signed)
Medication Instructions:  Your physician recommends that you continue on your current medications as directed. Please refer to the Current Medication list given to you today.   Labwork: none  Testing/Procedures: Your physician has requested that you have a lexiscan myoview. For further information please visit [email or web address]. Please follow instruction sheet, as given.   Follow-Up:  Your physician recommends that you schedule a follow-up appointment in: 3 months  Any Other Special Instructions Will Be Listed Below (If Applicable).  If you need a refill on your cardiac medications before your next appointment, please call your pharmacy.  

## 2018-06-24 NOTE — Addendum Note (Signed)
Addended by: Thompson Grayer on: 06/24/2018 04:35 PM   Modules accepted: Orders

## 2018-06-24 NOTE — Telephone Encounter (Signed)
Patient given detailed instructions per Myocardial Perfusion Study Information Sheet for the test on 06/27/18 at 0715. Patient notified to arrive 15 minutes early and that it is imperative to arrive on time for appointment to keep from having the test rescheduled.  If you need to cancel or reschedule your appointment, please call the office within 24 hours of your appointment. . Patient verbalized understanding.[name] [name], [name]

## 2018-06-27 ENCOUNTER — Ambulatory Visit (HOSPITAL_COMMUNITY): Payer: Medicare Other | Attending: Internal Medicine

## 2018-06-27 DIAGNOSIS — I25118 Atherosclerotic heart disease of native coronary artery with other forms of angina pectoris: Secondary | ICD-10-CM | POA: Insufficient documentation

## 2018-06-27 LAB — MYOCARDIAL PERFUSION IMAGING
CHL CUP NUCLEAR SSS: 12
LHR: 0.4
LV dias vol: 107 mL (ref 62–150)
LV sys vol: 41 mL
Peak HR: 74 {beats}/min
Rest HR: 46 {beats}/min
SDS: 5
SRS: 9
TID: 1.07

## 2018-06-27 MED ORDER — TECHNETIUM TC 99M TETROFOSMIN IV KIT
10.8000 | PACK | Freq: Once | INTRAVENOUS | Status: AC | PRN
  Administered 2018-06-27: 10.8 via INTRAVENOUS
  Filled 2018-06-27: qty 11

## 2018-06-27 MED ORDER — REGADENOSON 0.4 MG/5ML IV SOLN
0.4000 mg | Freq: Once | INTRAVENOUS | Status: AC
  Administered 2018-06-27: 0.4 mg via INTRAVENOUS

## 2018-06-27 MED ORDER — TECHNETIUM TC 99M TETROFOSMIN IV KIT
32.8000 | PACK | Freq: Once | INTRAVENOUS | Status: AC | PRN
  Administered 2018-06-27: 32.8 via INTRAVENOUS
  Filled 2018-06-27: qty 33

## 2018-08-12 ENCOUNTER — Ambulatory Visit: Payer: Self-pay | Admitting: Surgery

## 2018-08-12 ENCOUNTER — Encounter: Payer: Self-pay | Admitting: Surgery

## 2018-08-12 DIAGNOSIS — C186 Malignant neoplasm of descending colon: Secondary | ICD-10-CM | POA: Insufficient documentation

## 2018-08-12 NOTE — H&P (Signed)
[name] [name] Documented: 08/12/2018 2:48 PM Location: [facility] Patient #: [id number] DOB: 07/29/1946 Married / Language: English / Race: White Male  History of Present Illness [name] Hector MD; 08/12/2018 3:58 PM) The patient is a 72 year old male who presents with colorectal cancer. Note for "Colorectal cancer": ` ` ` Patient sent for surgical consultation at the request of Dr [name] [name]  Chief Complaint: New descending colon cancer ` ` The patient is a pleasant gentleman here with his wife. History of diabetes on oral hypoglycemics. Coronary artery disease status post CABG 2001. No major issues since. Was found to have a positive colon cancer screening test. Positive FIT. Underwent upper and lower endoscopy. History of some mild epigastric discomfort. Upper endoscopy complete normal. Colonoscopy revealed left-sided colon polyps and a mass 50 cm from the anus, suspicious for descending colon. Biopsy consistent with adenocarcinoma. Surgical consultation requested.  Patient denies any family history of colon cancer. Mother had polyps. No family history of breast/ovarian/prostate cancer. Usually moves his bowels every day or so. He had a laparoscopic cholecystectomy in the 1990s. No other abdominal surgeries. He is hard of hearing. Dramatically any with cardiology. Had episode of chest pain a few months ago with a negative perfusion cardiac study. He can walk about 20 minutes before he has to stop. He does do yardwork but does get short of breath when he mows for about 15/20 minutes. Last hemoglobin A1c 6.9. No history of infections. No personal nor family history of GI/colon cancer, inflammatory bowel disease, irritable bowel syndrome, allergy such as Celiac Sprue, dietary/dairy problems, colitis, ulcers nor gastritis. No recent sick contacts/gastroenteritis. No travel outside the country. No changes in diet. No dysphagia to solids or liquids. No  significant heartburn or reflux. No hematochezia, hematemesis, coffee ground emesis. No evidence of prior gastric/peptic ulceration.  (Review of systems as stated in this history (HPI) or in the review of systems. Otherwise all other 12 point ROS are negative) ` ` `   Past Surgical History ([name] [name], [name] [name]; 08/12/2018 3:08 PM) Coronary Artery Bypass Graft Gallbladder Surgery - Laparoscopic Oral Surgery Shoulder Surgery Right. Spinal Surgery - Neck  Allergies ([name] [name], [name] [name]; 08/12/2018 3:09 PM) No Known Drug Allergies [08/12/2018]: Allergies Reconciled  Medication History ([name] [name], [name] [name]; 08/12/2018 3:10 PM) Atorvastatin Calcium (10MG  Tablet, Oral) Active. glipiZIDE (5MG  Tablet, Oral) Active. Levothyroxine Sodium (125MCG Tablet, Oral) Active. Pantoprazole Sodium (40MG  Tablet DR, Oral) Active. Metoprolol Succinate ER (50MG  Tablet ER 24HR, Oral) Active. Aspirin (81MG  Tablet, Oral) Active. [facility] Vitamin B12 (1000MCG Tablet, Oral) Active. Vitamin D3 (Oral) Specific strength unknown - Active. Medications Reconciled  Social History ([name] [name], [name] [name]; 08/12/2018 3:08 PM) Caffeine use Carbonated beverages, Tea. No alcohol use No drug use Tobacco use Former smoker.  Family History ([name] [name], [name] [name]; 08/12/2018 3:08 PM) Alcohol Abuse Daughter. Arthritis Mother. Bleeding disorder Mother. Colon Polyps Mother. Diabetes Mellitus Father, Mother. Heart Disease Mother.  Other Problems ([name] [name], [name] [name]; 08/12/2018 3:08 PM) Back Pain Bladder Problems Chest pain Cholelithiasis Colon Cancer Congestive Heart Failure Depression Diabetes Mellitus Gastroesophageal Reflux Disease Hypercholesterolemia Myocardial infarction Thyroid Disease     Review of Systems ([name] [name] [name] [name]; 08/12/2018 3:08 PM) General Present- Appetite Loss, Fatigue, Night Sweats and Weight Loss. Not Present- Chills, Fever and Weight  Gain. Skin Not Present- Change in Wart/Mole, Dryness, Hives, Jaundice, New Lesions, Non-Healing Wounds, Rash and Ulcer. HEENT Present- Hearing Loss, Hoarseness, Ringing in the Ears and Wears glasses/contact lenses. Not  Present- Earache, Nose Bleed, Oral Ulcers, Seasonal Allergies, Sinus Pain, Sore Throat, Visual Disturbances and Yellow Eyes. Cardiovascular Present- Leg Cramps. Not Present- Chest Pain, Difficulty Breathing Lying Down, Palpitations, Rapid Heart Rate, Shortness of Breath and Swelling of Extremities. Gastrointestinal Present- Difficulty Swallowing, Gets full quickly at meals, Indigestion and Nausea. Not Present- Abdominal Pain, Bloating, Bloody Stool, Change in Bowel Habits, Chronic diarrhea, Constipation, Excessive gas, Hemorrhoids, Rectal Pain and Vomiting. Male Genitourinary Present- Urgency and Urine Leakage. Not Present- Blood in Urine, Change in Urinary Stream, Frequency, Impotence, Nocturia and Painful Urination.  Vitals ([name] [name] [name] [name]; 08/12/2018 3:08 PM) 08/12/2018 3:08 PM Weight: 200.2 lb Height: 66in Body Surface Area: 2 m Body Mass Index: 32.31 kg/m  Temp.: 98.4F  Pulse: 72 (Regular)  BP: 128/76 (Sitting, Left Arm, Standard)      Physical Exam [name] Hector MD; 08/12/2018 3:47 PM)  General Mental Status-Alert. General Appearance-Not in acute distress, Not Sickly. Orientation-Oriented X3. Hydration-Well hydrated. Voice-Normal.  Integumentary Global Assessment Upon inspection and palpation of skin surfaces of the - Axillae: non-tender, no inflammation or ulceration, no drainage. and Distribution of scalp and body hair is normal. General Characteristics Temperature - normal warmth is noted.  Head and Neck Head-normocephalic, atraumatic with no lesions or palpable masses. Face Global Assessment - atraumatic, no absence of expression. Neck Global Assessment - no abnormal movements, no bruit auscultated on the right, no bruit  auscultated on the left, no decreased range of motion, non-tender. Trachea-midline. Thyroid Gland Characteristics - non-tender.  Eye Eyeball - Left-Extraocular movements intact, No Nystagmus. Eyeball - Right-Extraocular movements intact, No Nystagmus. Cornea - Left-No Hazy. Cornea - Right-No Hazy. Sclera/Conjunctiva - Left-No scleral icterus, No Discharge. Sclera/Conjunctiva - Right-No scleral icterus, No Discharge. Pupil - Left-Direct reaction to light normal. Pupil - Right-Direct reaction to light normal.  ENMT Ears Pinna - Left - no drainage observed, no generalized tenderness observed. Right - no drainage observed, no generalized tenderness observed. Nose and Sinuses External Inspection of the Nose - no destructive lesion observed. Inspection of the nares - Left - quiet respiration. Right - quiet respiration. Mouth and Throat Lips - Upper Lip - no fissures observed, no pallor noted. Lower Lip - no fissures observed, no pallor noted. Nasopharynx - no discharge present. Oral Cavity/Oropharynx - Tongue - no dryness observed. Oral Mucosa - no cyanosis observed. Hypopharynx - no evidence of airway distress observed. Note: Hard of hearing  Chest and Lung Exam Inspection Movements - Normal and Symmetrical. Accessory muscles - No use of accessory muscles in breathing. Palpation Palpation of the chest reveals - Non-tender. Auscultation Breath sounds - Normal and Clear.  Cardiovascular Auscultation Rhythm - Regular. Murmurs & Other Heart Sounds - Auscultation of the heart reveals - No Murmurs and No Systolic Clicks.  Abdomen Inspection Inspection of the abdomen reveals - No Visible peristalsis and No Abnormal pulsations. Umbilicus - No Bleeding, No Urine drainage. Palpation/Percussion Palpation and Percussion of the abdomen reveal - Soft, Non Tender, No Rebound tenderness, No Rigidity (guarding) and No Cutaneous hyperesthesia. Note: Abdomen soft. Nontender.  Small epigastric mass most likely small lipoma or tiny incarcerated subxiphoid port site hernia. Asymptomatic. Moderate diastases recti. No real discomfort today. Not distended. No umbilical or incisional hernias. No guarding.  Male Genitourinary Sexual Maturity Tanner 5 - Adult hair pattern and Adult penile size and shape. Note: Mild impulse in right groin but no definite hernia. No lymphadenopathy. Normal external genitalia  Peripheral Vascular Upper Extremity Inspection - Left - No Cyanotic nailbeds, Not Ischemic.  Right - No Cyanotic nailbeds, Not Ischemic.  Neurologic Neurologic evaluation reveals -normal attention span and ability to concentrate, able to name objects and repeat phrases. Appropriate fund of knowledge , normal sensation and normal coordination. Mental Status Affect - not angry, not paranoid. Cranial Nerves-Normal Bilaterally. Gait-Normal.  Neuropsychiatric Mental status exam performed with findings of-able to articulate well with normal speech/language, rate, volume and coherence, thought content normal with ability to perform basic computations and apply abstract reasoning and no evidence of hallucinations, delusions, obsessions or homicidal/suicidal ideation.  Musculoskeletal Global Assessment Spine, Ribs and Pelvis - no instability, subluxation or laxity. Right Upper Extremity - no instability, subluxation or laxity.  Lymphatic Head & Neck  General Head & Neck Lymphatics: Bilateral - Description - No Localized lymphadenopathy. Axillary  General Axillary Region: Bilateral - Description - No Localized lymphadenopathy. Femoral & Inguinal  Generalized Femoral & Inguinal Lymphatics: Left - Description - No Localized lymphadenopathy. Right - Description - No Localized lymphadenopathy.    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 Also left-sided colon polyps.  Complete workup. CT scan of chest/abdomen/pelvis. CEA. Rule out metastatic disease. Help localize where the primary is. I cannot definitely see a tumor on his prior CAT scan from 2016 ?mild thickening in the mid/distal descending colon.  Standard of care is segmental colonic resection. Minimally invasive approach. Hopefully should be able to do robotically. Ideally we will try and remove the cancer & all the polyps, but I would like to try and avoid a left hemicolectomy if possible.  Current Plans Pt Education - [facility] Colorectal Cancer (AT): discussed with patient and provided information. CARCINOEMBRYONIC ANTIGEN (CEA) (96295) METABOLIC PANEL, COMPREHENSIVE (28413) CT CHEST W/ CONTRAST (24401) (Pt needs CT chest, abdomen, and pelvis w/contrast to eval. new dx adenocarcinoma of descending colon.) CT ABDOMEN AND PELVIS W CONTRAST (02725)  PREOP COLON - ENCOUNTER FOR PREOPERATIVE EXAMINATION FOR GENERAL SURGICAL PROCEDURE (Z01.818)  Current Plans You are being scheduled for surgery- Our schedulers will call you.  You should hear from our office's scheduling department within 5 working days about the location, date, and time of surgery. We try to make accommodations for patient's preferences in scheduling surgery, but sometimes the OR schedule or the surgeon's schedule prevents [place] from making those accommodations.  If you have not heard from our office [phone]) in 5 working days, call the office and ask for your surgeon's nurse.  If you have other questions about your diagnosis, plan, or surgery, call the office and ask for your surgeon's nurse.  Written instructions provided The anatomy & physiology of the digestive tract was discussed. The pathophysiology of the colon was discussed. Natural history risks without surgery was discussed. I feel the risks of no intervention will lead to serious problems that outweigh the  operative risks; therefore, I recommended a partial colectomy to remove the pathology. Minimally invasive (Robotic/Laparoscopic) & open techniques were discussed.  Risks such as bleeding, infection, abscess, leak, reoperation, possible ostomy, hernia, heart attack, death, and other risks were discussed. I noted a good likelihood this will help address the problem. Goals of post-operative recovery were discussed as well. Need for adequate nutrition, daily bowel regimen and healthy physical activity, to optimize recovery was noted as well. We will work to minimize complications. Educational materials were available as well. Questions were answered. The patient expresses understanding & wishes to proceed with surgery.  Pt Education - [facility] Colon Bowel Prep 2018 ERAS/Miralax/Antibiotics Started Neomycin Sulfate 500 MG Oral Tablet, 2 (two) Tablet SEE NOTE, #6, 08/12/2018, No Refill. Local Order: TAKE TWO TABLETS AT 2 PM, 3 PM, AND 10 PM THE DAY PRIOR TO SURGERY Started Flagyl 500 MG Oral Tablet, 2 (two) Tablet SEE NOTE, #6, 08/12/2018, No Refill. Local Order: Take at 2pm, 3pm, and 10pm the day prior to your colon operation Pt Education - Pamphlet Given - Laparoscopic Colorectal Surgery: discussed with patient and provided information. Pt Education - [facility] Colectomy post-op instructions: discussed with patient and provided information.  ADENOMATOUS POLYP OF TRANSVERSE COLON (D12.3)   ADENOMATOUS POLYP OF DESCENDING COLON (D12.4)   ADENOMATOUS POLYP OF SIGMOID COLON (D12.5)  Current Plans Pt Education - Polyps in the Colon and Rectum (Colonic and Rectal Polyps): colonic polyps  [name] Hector, MD, FACS, MASCRS Gastrointestinal and Minimally Invasive Surgery    [address], [place], [place] [address] [phone] Main / Paging [phone] Fax

## 2018-08-13 ENCOUNTER — Other Ambulatory Visit: Payer: Self-pay | Admitting: Surgery

## 2018-08-13 ENCOUNTER — Telehealth: Payer: Self-pay | Admitting: *Deleted

## 2018-08-13 DIAGNOSIS — C186 Malignant neoplasm of descending colon: Secondary | ICD-10-CM

## 2018-08-13 NOTE — Telephone Encounter (Signed)
   [facility] Pre-operative Risk Assessment    Request for surgical clearance:  1. What type of surgery is being performed? Robotic Distal Colectomy   2. When is this surgery scheduled? TBD   3. What type of clearance is required (medical clearance vs. Pharmacy clearance to hold med vs. Both)? Both  4. Are there any medications that need to be held prior to surgery and how long? N/A  5. Practice name and name of physician performing surgery? [facility]   6. What is your office phone [phone]    7.   What is your office fax number [phone] attn Lilly  8.   Anesthesia type (None, local, MAC, general) ? gernal anethesia   [name] [name] 08/13/2018, 10:19 AM  _________________________________________________________________   (provider comments below)

## 2018-08-15 ENCOUNTER — Ambulatory Visit
Admission: RE | Admit: 2018-08-15 | Discharge: 2018-08-15 | Disposition: A | Discharge status: Home / Self Care | Payer: Medicare Other | Source: Ambulatory Visit | Attending: Surgery | Admitting: Surgery

## 2018-08-15 ENCOUNTER — Telehealth: Payer: Self-pay | Admitting: Cardiovascular Disease

## 2018-08-15 ENCOUNTER — Other Ambulatory Visit: Payer: Medicare Other

## 2018-08-15 ENCOUNTER — Telehealth: Payer: Self-pay | Admitting: *Deleted

## 2018-08-15 DIAGNOSIS — C186 Malignant neoplasm of descending colon: Secondary | ICD-10-CM

## 2018-08-15 MED ORDER — IOPAMIDOL (ISOVUE-300) INJECTION 61%
100.0000 mL | Freq: Once | INTRAVENOUS | Status: AC | PRN
  Administered 2018-08-15: 100 mL via INTRAVENOUS

## 2018-08-15 NOTE — Telephone Encounter (Signed)
Follow Up: ° ° ° °Returning your call from yesterday. °

## 2018-08-15 NOTE — Telephone Encounter (Signed)
Tried again to reach pt. Voicemail not set up

## 2018-08-15 NOTE — Telephone Encounter (Signed)
I agree Mickel Baas. OK to proceed and hold ASA. Also routing this to pre op box. Gerald Stabs

## 2018-08-15 NOTE — Telephone Encounter (Signed)
I spoke with pt who gave me permission to speak with his wife. Pt's wife reports she received message yesterday to call our office regarding surgical clearance. Will forward to pre op pool.  Best number to reach pt is [phone]

## 2018-08-15 NOTE — Telephone Encounter (Signed)
I did not place call to pt or his wife yesterday.  Wife is not listed on pt's DPR.  Only number for pt on DPR is [phone]. I placed call to this number but voicemail has not been set up.

## 2018-08-15 NOTE — Telephone Encounter (Signed)
-----   Message from Rivka Barbara sent at 08/14/2018  3:14 PM EDT ----- Regarding: Appointment This patients wife called and only wants to speak with you. She states that you left her message about a surgical clinic for her spouse. But I do not see any notes. I see the clearance was just put in on the 8th but I do not see the notes. She said that is what you left on her vm today. But she only wants to speak with you.

## 2018-08-15 NOTE — Telephone Encounter (Signed)
Dr. Angelena Form pt need robotic distal colectomy - I saw recent normal nuc.  I would say mod risk - I have a call into pt to see how he is. But do you agree mod risk? And can he be off ASA for 3 days?

## 2018-08-16 ENCOUNTER — Telehealth: Payer: Self-pay | Admitting: Cardiology

## 2018-08-16 NOTE — Telephone Encounter (Signed)
   Primary Cardiologist: Lauree Chandler, MD  Chart reviewed as part of pre-operative protocol coverage. Patient was contacted 08/16/2018 in reference to pre-operative risk assessment for pending surgery as outlined below.  [name] [name] was last seen on 06/24/18 by Dr. Angelena Form.  Since that day, [name] [name] has done well, denies chat pain or dyspnea, he did have stress test 06/27/18 that was negative for ischemia. .  Therefore, based on ACC/AHA guidelines, the patient would be at acceptable risk though moderate risk for the planned procedure without further cardiovascular testing.   I will route this recommendation to the requesting party via Epic fax function and remove from pre-op pool.  Please call with questions.  Cecilie Kicks, NP 08/16/2018, 9:22 AM

## 2018-08-19 NOTE — Telephone Encounter (Signed)
See 08/13/18 phone note.

## 2018-09-04 ENCOUNTER — Encounter: Payer: Self-pay | Admitting: Cardiovascular Disease

## 2018-09-12 ENCOUNTER — Encounter (HOSPITAL_COMMUNITY): Payer: Self-pay

## 2018-09-12 NOTE — Pre-Procedure Instructions (Signed)
The following are in epic: Cardiac clearance telephone encounter Dorene Ar, NP 08/13/2018 Last office visit note [place] 06/24/2018 EKG 06/24/2018 Stress 06/27/2018

## 2018-09-12 NOTE — Patient Instructions (Signed)
Your procedure is scheduled on: Thursday, Nov. 14, 2019   Surgery Time:  7:30AM-10:30AM   Report to [place]  Entrance    Report to admitting at 5:30 AM   Call this number if you have problems the morning of surgery [phone]   The day before surgery:    Drink plenty of liquids to prevent dehydration    Dulcolax: take with water the day prior to surgery    Miralax:  Mix with 64 oz Gatorade/Powerade. Drink gradually over the next few hours (8 oz glass every 15-30 minutes) until gone the day prior to surgery.    Neomycin: At 2 pm, 3 pm and 10 pm after Miralax bowel prep the day prior to surgery.     Metronidazole:  At 2 pm, 3 pm and 10 pm after Miralax bowel prep the day prior to surgery.    No solids after midnight prior to surgery   May have liquids until 4:30AM morning of surgery CLEAR LIQUID DIET  Foods Allowed                                                                     Foods Excluded  Water, Black Coffee and tea, regular and decaf                             liquids that you cannot  Plain Jell-O in any flavor                                             see through such as: Fruit ices (not with fruit pulp)                                     milk, soups, orange juice  Iced Popsicles                                    All solid food Carbonated beverages, regular and diet                                    Cranberry, grape and apple juices Sports drinks like Gatorade Lightly seasoned clear broth or consume(fat free) Sugar, honey syrup  Sample Menu Breakfast                                Lunch                                     Supper Cranberry juice                    Beef broth  Chicken broth Jell-O                                     Grape juice                           Apple juice Coffee or tea                        Jell-O                                      Popsicle              Coffee or tea                        Coffee or tea   Drink 2 Ensure drinks the night before surgery.     Brush your teeth the morning of surgery.   Do NOT smoke after Midnight   Complete one Ensure drink the morning of surgery at 4:30AM prior to scheduled surgery.   Take these medicines the morning of surgery with A SIP OF WATER: Levothyroxine, Metoprolol, Pantoprazole  DO NOT TAKE ANY DIABETIC MEDICATIONS DAY OF YOUR SURGERY                               You may not have any metal on your body including jewelry, and body piercings             Do not wear lotions, powders, perfumes/cologne, or deodorant                           Men may shave face and neck.   Do not bring valuables to the hospital. [place].   Contacts, dentures or bridgework may not be worn into surgery.   Leave suitcase in the car. After surgery it may be brought to your room.    Special Instructions: Bring a copy of your healthcare power of attorney and living will documents         the day of surgery if you haven't scanned them in before.              Please read over the following fact sheets you were given:  [facility] - Preparing for Surgery Before surgery, you can play an important role.  Because skin is not sterile, your skin needs to be as free of germs as possible.  You can reduce the number of germs on your skin by washing with CHG (chlorahexidine gluconate) soap before surgery.  CHG is an antiseptic cleaner which kills germs and bonds with the skin to continue killing germs even after washing. Please DO NOT use if you have an allergy to CHG or antibacterial soaps.  If your skin becomes reddened/irritated stop using the CHG and inform your nurse when you arrive at Short Stay. Do not shave (including legs and underarms) for at least 48 hours prior to the first CHG shower.  You may shave your face/neck.  Please follow these instructions  carefully:  1.  Shower with CHG Soap the night before surgery and the  morning of surgery.  2.  If you choose to wash your hair, wash your hair first as usual with your normal  shampoo.  3.  After you shampoo, rinse your hair and body thoroughly to remove the shampoo.                             4.  Use CHG as you would any other liquid soap.  You can apply chg directly to the skin and wash.  Gently with a scrungie or clean washcloth.  5.  Apply the CHG Soap to your body ONLY FROM THE NECK DOWN.   Do   not use on face/ open                           Wound or open sores. Avoid contact with eyes, ears mouth and   genitals (private parts).                       Wash face,  Genitals (private parts) with your normal soap.             6.  Wash thoroughly, paying special attention to the area where your    surgery  will be performed.  7.  Thoroughly rinse your body with warm water from the neck down.  8.  DO NOT shower/wash with your normal soap after using and rinsing off the CHG Soap.                9.  Pat yourself dry with a clean towel.            10.  Wear clean pajamas.            11.  Place clean sheets on your bed the night of your first shower and do not  sleep with pets. Day of Surgery : Do not apply any lotions/deodorants the morning of surgery.  Please wear clean clothes to the hospital/surgery center.  FAILURE TO FOLLOW THESE INSTRUCTIONS MAY RESULT IN THE CANCELLATION OF YOUR SURGERY  PATIENT SIGNATURE_________________________________  NURSE SIGNATURE__________________________________  ________________________________________________________________________   [name] [name]  An incentive spirometer is a tool that can help keep your lungs clear and active. This tool measures how well you are filling your lungs with each breath. Taking long deep breaths may help reverse or decrease the chance of developing breathing (pulmonary) problems (especially infection) following:  A  long period of time when you are unable to move or be active. BEFORE THE PROCEDURE   If the spirometer includes an indicator to show your best effort, your nurse or respiratory therapist will set it to a desired goal.  If possible, sit up straight or lean slightly forward. Try not to slouch.  Hold the incentive spirometer in an upright position. INSTRUCTIONS FOR USE  1. Sit on the edge of your bed if possible, or sit up as far as you can in bed or on a chair. 2. Hold the incentive spirometer in an upright position. 3. Breathe out normally. 4. Place the mouthpiece in your mouth and seal your lips tightly around it. 5. Breathe in slowly and as deeply as possible, raising the piston or the ball toward the top of the column. 6. Hold your breath for 3-5 seconds or for as long as possible. Allow the  piston or ball to fall to the bottom of the column. 7. Remove the mouthpiece from your mouth and breathe out normally. 8. Rest for a few seconds and repeat Steps 1 through 7 at least 10 times every 1-2 hours when you are awake. Take your time and take a few normal breaths between deep breaths. 9. The spirometer may include an indicator to show your best effort. Use the indicator as a goal to work toward during each repetition. 10. After each set of 10 deep breaths, practice coughing to be sure your lungs are clear. If you have an incision (the cut made at the time of surgery), support your incision when coughing by placing a pillow or rolled up towels firmly against it. Once you are able to get out of bed, walk around indoors and cough well. You may stop using the incentive spirometer when instructed by your caregiver.  RISKS AND COMPLICATIONS  Take your time so you do not get dizzy or light-headed.  If you are in pain, you may need to take or ask for pain medication before doing incentive spirometry. It is harder to take a deep breath if you are having pain. AFTER USE  Rest and breathe slowly and  easily.  It can be helpful to keep track of a log of your progress. Your caregiver can provide you with a simple table to help with this. If you are using the spirometer at home, follow these instructions: [place] IF:   You are having difficultly using the spirometer.  You have trouble using the spirometer as often as instructed.  Your pain medication is not giving enough relief while using the spirometer.  You develop fever of 100.5 F (38.1 C) or higher. SEEK IMMEDIATE MEDICAL CARE IF:   You cough up bloody sputum that had not been present before.  You develop fever of 102 F (38.9 C) or greater.  You develop worsening pain at or near the incision site. MAKE SURE YOU:   Understand these instructions.  Will watch your condition.  Will get help right away if you are not doing well or get worse. Document Released: 03/05/2007 Document Revised: 01/15/2012 Document Reviewed: 05/06/2007 Exit[employer] Patient Information 2014 [employer], [place].   ________________________________________________________________________  WHAT IS A BLOOD TRANSFUSION? Blood Transfusion Information  A transfusion is the replacement of blood or some of its parts. Blood is made up of multiple cells which provide different functions.  Red blood cells carry oxygen and are used for blood loss replacement.  White blood cells fight against infection.  Platelets control bleeding.  Plasma helps clot blood.  Other blood products are available for specialized needs, such as hemophilia or other clotting disorders. BEFORE THE TRANSFUSION  Who gives blood for transfusions?   Healthy volunteers who are fully evaluated to make sure their blood is safe. This is blood bank blood. Transfusion therapy is the safest it has ever been in the practice of medicine. Before blood is taken from a donor, a complete history is taken to make sure that person has no history of diseases nor engages in risky social  behavior (examples are intravenous drug use or sexual activity with multiple partners). The donor's travel history is screened to minimize risk of transmitting infections, such as malaria. The donated blood is tested for signs of infectious diseases, such as HIV and hepatitis. The blood is then tested to be sure it is compatible with you in order to minimize the chance of a transfusion reaction. If  you or a relative donates blood, this is often done in anticipation of surgery and is not appropriate for emergency situations. It takes many days to process the donated blood. RISKS AND COMPLICATIONS Although transfusion therapy is very safe and saves many lives, the main dangers of transfusion include:   Getting an infectious disease.  Developing a transfusion reaction. This is an allergic reaction to something in the blood you were given. Every precaution is taken to prevent this. The decision to have a blood transfusion has been considered carefully by your caregiver before blood is given. Blood is not given unless the benefits outweigh the risks. AFTER THE TRANSFUSION  Right after receiving a blood transfusion, you will usually feel much better and more energetic. This is especially true if your red blood cells have gotten low (anemic). The transfusion raises the level of the red blood cells which carry oxygen, and this usually causes an energy increase.  The nurse administering the transfusion will monitor you carefully for complications. HOME CARE INSTRUCTIONS  No special instructions are needed after a transfusion. You may find your energy is better. Speak with your caregiver about any limitations on activity for underlying diseases you may have. SEEK MEDICAL CARE IF:   Your condition is not improving after your transfusion.  You develop redness or irritation at the intravenous (IV) site. SEEK IMMEDIATE MEDICAL CARE IF:  Any of the following symptoms occur over the next 12 hours:  Shaking  chills.  You have a temperature by mouth above 102 F (38.9 C), not controlled by medicine.  Chest, back, or muscle pain.  People around you feel you are not acting correctly or are confused.  Shortness of breath or difficulty breathing.  Dizziness and fainting.  You get a rash or develop hives.  You have a decrease in urine output.  Your urine turns a dark color or changes to pink, red, or brown. Any of the following symptoms occur over the next 10 days:  You have a temperature by mouth above 102 F (38.9 C), not controlled by medicine.  Shortness of breath.  Weakness after normal activity.  The white part of the eye turns yellow (jaundice).  You have a decrease in the amount of urine or are urinating less often.  Your urine turns a dark color or changes to pink, red, or brown. Document Released: 10/20/2000 Document Revised: 01/15/2012 Document Reviewed: 06/08/2008 [facility] Patient Information 2014 [place], [place].  _______________________________________________________________________

## 2018-09-13 ENCOUNTER — Encounter (HOSPITAL_COMMUNITY): Payer: Self-pay

## 2018-09-13 ENCOUNTER — Other Ambulatory Visit: Payer: Self-pay

## 2018-09-13 ENCOUNTER — Encounter (HOSPITAL_COMMUNITY)
Admission: RE | Admit: 2018-09-13 | Discharge: 2018-09-13 | Disposition: A | Discharge status: Home / Self Care | Payer: Medicare Other | Source: Ambulatory Visit | Attending: Surgery | Admitting: Surgery

## 2018-09-13 DIAGNOSIS — C189 Malignant neoplasm of colon, unspecified: Secondary | ICD-10-CM | POA: Diagnosis not present

## 2018-09-13 DIAGNOSIS — Z01812 Encounter for preprocedural laboratory examination: Secondary | ICD-10-CM | POA: Diagnosis not present

## 2018-09-13 DIAGNOSIS — K635 Polyp of colon: Secondary | ICD-10-CM | POA: Diagnosis not present

## 2018-09-13 HISTORY — DX: Dysphagia, unspecified: R13.10

## 2018-09-13 HISTORY — DX: Cystitis, unspecified without hematuria: N30.90

## 2018-09-13 HISTORY — DX: Acute myocardial infarction, unspecified: I21.9

## 2018-09-13 HISTORY — DX: Gastro-esophageal reflux disease without esophagitis: K21.9

## 2018-09-13 HISTORY — DX: Hypothyroidism, unspecified: E03.9

## 2018-09-13 HISTORY — DX: Spondylosis without myelopathy or radiculopathy, cervical region: M47.812

## 2018-09-13 HISTORY — DX: Malignant neoplasm of colon, unspecified: C18.9

## 2018-09-13 HISTORY — DX: Cardiomegaly: I51.7

## 2018-09-13 LAB — BASIC METABOLIC PANEL
Anion gap: 9 (ref 5–15)
BUN: 11 mg/dL (ref 8–23)
CHLORIDE: 104 mmol/L (ref 98–111)
CO2: 27 mmol/L (ref 22–32)
Calcium: 9.3 mg/dL (ref 8.9–10.3)
Creatinine, Ser: 1.08 mg/dL (ref 0.61–1.24)
GFR calc Af Amer: >60 mL/min (ref >60)
GFR calc non Af Amer: >60 mL/min (ref >60)
Glucose, Bld: 145 mg/dL — ABNORMAL HIGH (ref 70–99)
POTASSIUM: 4.8 mmol/L (ref 3.5–5.1)
Sodium: 140 mmol/L (ref 135–145)

## 2018-09-13 LAB — HEMOGLOBIN A1C
Hgb A1c MFr Bld: 7 % — ABNORMAL HIGH (ref 4.8–5.6)
Mean Plasma Glucose: 154.2 mg/dL

## 2018-09-13 LAB — CBC
HCT: 46.1 % (ref 39.0–52.0)
HEMOGLOBIN: 14.8 g/dL (ref 13.0–17.0)
MCH: 31.6 pg (ref 26.0–34.0)
MCHC: 32.1 g/dL (ref 30.0–36.0)
MCV: 98.5 fL (ref 80.0–100.0)
Platelets: 127 10*3/uL — ABNORMAL LOW (ref 150–400)
RBC: 4.68 MIL/uL (ref 4.22–5.81)
RDW: 12.3 % (ref 11.5–15.5)
WBC: 7.4 10*3/uL (ref 4.0–10.5)
nRBC: 0 % (ref 0.0–0.2)

## 2018-09-13 LAB — GLUCOSE, CAPILLARY: GLUCOSE-CAPILLARY: 112 mg/dL — AB (ref 70–99)

## 2018-09-13 MED ORDER — BISACODYL 5 MG PO TBEC
20.0000 mg | DELAYED_RELEASE_TABLET | Freq: Once | ORAL | Status: DC
  Filled 2018-09-13: qty 4

## 2018-09-13 MED ORDER — POLYETHYLENE GLYCOL 3350 17 GM/SCOOP PO POWD
1.0000 | Freq: Once | ORAL | Status: DC
  Filled 2018-09-13: qty 255

## 2018-09-13 MED ORDER — METRONIDAZOLE 500 MG PO TABS
1000.0000 mg | ORAL_TABLET | ORAL | Status: DC
  Filled 2018-09-13: qty 2

## 2018-09-13 MED ORDER — NEOMYCIN SULFATE 500 MG PO TABS
1000.0000 mg | ORAL_TABLET | ORAL | Status: DC
  Filled 2018-09-13: qty 2

## 2018-09-13 NOTE — Pre-Procedure Instructions (Signed)
BMP, Hgb A1C results 09/13/2018 sent to Dr. Johney [place] via epic.

## 2018-09-13 NOTE — Pre-Procedure Instructions (Signed)
CBC results 09/13/18 sent to Dr. Johney [place] via epic.

## 2018-09-14 LAB — ABO/RH: ABO/RH(D): A POS

## 2018-09-18 ENCOUNTER — Encounter (HOSPITAL_COMMUNITY): Payer: Self-pay | Admitting: Certified Registered Nurse Anesthetist

## 2018-09-18 MED ORDER — BUPIVACAINE LIPOSOME 1.3 % IJ SUSP
20.0000 mL | INTRAMUSCULAR | Status: DC
  Filled 2018-09-18: qty 20

## 2018-09-19 ENCOUNTER — Encounter (HOSPITAL_COMMUNITY): Payer: Self-pay | Admitting: Emergency Medicine

## 2018-09-19 ENCOUNTER — Other Ambulatory Visit: Payer: Self-pay

## 2018-09-19 ENCOUNTER — Inpatient Hospital Stay (HOSPITAL_COMMUNITY)
Admission: RE | Admit: 2018-09-19 | Discharge: 2018-09-23 | DRG: 331 | Disposition: A | Discharge status: Home / Self Care | Payer: Medicare Other | Source: Ambulatory Visit | Attending: Surgery | Admitting: Surgery

## 2018-09-19 ENCOUNTER — Inpatient Hospital Stay (HOSPITAL_COMMUNITY): Payer: Medicare Other | Admitting: Certified Registered Nurse Anesthetist

## 2018-09-19 ENCOUNTER — Encounter (HOSPITAL_COMMUNITY)
Admission: RE | Disposition: A | Discharge status: Home / Self Care | Payer: Self-pay | Source: Ambulatory Visit | Attending: Surgery

## 2018-09-19 DIAGNOSIS — E785 Hyperlipidemia, unspecified: Secondary | ICD-10-CM | POA: Diagnosis present

## 2018-09-19 DIAGNOSIS — D125 Benign neoplasm of sigmoid colon: Secondary | ICD-10-CM | POA: Diagnosis present

## 2018-09-19 DIAGNOSIS — H9190 Unspecified hearing loss, unspecified ear: Secondary | ICD-10-CM | POA: Diagnosis present

## 2018-09-19 DIAGNOSIS — Z9049 Acquired absence of other specified parts of digestive tract: Secondary | ICD-10-CM

## 2018-09-19 DIAGNOSIS — R1310 Dysphagia, unspecified: Secondary | ICD-10-CM | POA: Diagnosis present

## 2018-09-19 DIAGNOSIS — Z7984 Long term (current) use of oral hypoglycemic drugs: Secondary | ICD-10-CM

## 2018-09-19 DIAGNOSIS — I119 Hypertensive heart disease without heart failure: Secondary | ICD-10-CM | POA: Diagnosis present

## 2018-09-19 DIAGNOSIS — I2510 Atherosclerotic heart disease of native coronary artery without angina pectoris: Secondary | ICD-10-CM | POA: Diagnosis present

## 2018-09-19 DIAGNOSIS — K219 Gastro-esophageal reflux disease without esophagitis: Secondary | ICD-10-CM | POA: Diagnosis present

## 2018-09-19 DIAGNOSIS — K621 Rectal polyp: Secondary | ICD-10-CM | POA: Diagnosis present

## 2018-09-19 DIAGNOSIS — K589 Irritable bowel syndrome without diarrhea: Secondary | ICD-10-CM | POA: Diagnosis present

## 2018-09-19 DIAGNOSIS — E669 Obesity, unspecified: Secondary | ICD-10-CM | POA: Diagnosis present

## 2018-09-19 DIAGNOSIS — D649 Anemia, unspecified: Secondary | ICD-10-CM | POA: Diagnosis present

## 2018-09-19 DIAGNOSIS — Z818 Family history of other mental and behavioral disorders: Secondary | ICD-10-CM | POA: Diagnosis not present

## 2018-09-19 DIAGNOSIS — E119 Type 2 diabetes mellitus without complications: Secondary | ICD-10-CM | POA: Diagnosis present

## 2018-09-19 DIAGNOSIS — Z8261 Family history of arthritis: Secondary | ICD-10-CM

## 2018-09-19 DIAGNOSIS — C186 Malignant neoplasm of descending colon: Secondary | ICD-10-CM | POA: Diagnosis present

## 2018-09-19 DIAGNOSIS — Z8249 Family history of ischemic heart disease and other diseases of the circulatory system: Secondary | ICD-10-CM | POA: Diagnosis not present

## 2018-09-19 DIAGNOSIS — D124 Benign neoplasm of descending colon: Secondary | ICD-10-CM | POA: Diagnosis present

## 2018-09-19 DIAGNOSIS — I493 Ventricular premature depolarization: Secondary | ICD-10-CM | POA: Diagnosis present

## 2018-09-19 DIAGNOSIS — D123 Benign neoplasm of transverse colon: Secondary | ICD-10-CM | POA: Diagnosis present

## 2018-09-19 DIAGNOSIS — E039 Hypothyroidism, unspecified: Secondary | ICD-10-CM | POA: Diagnosis present

## 2018-09-19 DIAGNOSIS — Z8371 Family history of colonic polyps: Secondary | ICD-10-CM

## 2018-09-19 DIAGNOSIS — Z951 Presence of aortocoronary bypass graft: Secondary | ICD-10-CM

## 2018-09-19 DIAGNOSIS — Z832 Family history of diseases of the blood and blood-forming organs and certain disorders involving the immune mechanism: Secondary | ICD-10-CM

## 2018-09-19 DIAGNOSIS — I10 Essential (primary) hypertension: Secondary | ICD-10-CM | POA: Diagnosis present

## 2018-09-19 DIAGNOSIS — Z6833 Body mass index (BMI) 33.0-33.9, adult: Secondary | ICD-10-CM | POA: Diagnosis not present

## 2018-09-19 DIAGNOSIS — Z87891 Personal history of nicotine dependence: Secondary | ICD-10-CM

## 2018-09-19 DIAGNOSIS — Z833 Family history of diabetes mellitus: Secondary | ICD-10-CM

## 2018-09-19 DIAGNOSIS — Z7982 Long term (current) use of aspirin: Secondary | ICD-10-CM

## 2018-09-19 DIAGNOSIS — I25810 Atherosclerosis of coronary artery bypass graft(s) without angina pectoris: Secondary | ICD-10-CM | POA: Diagnosis present

## 2018-09-19 DIAGNOSIS — Z79890 Hormone replacement therapy (postmenopausal): Secondary | ICD-10-CM | POA: Diagnosis not present

## 2018-09-19 HISTORY — PX: PROCTOSCOPY: SHX2266

## 2018-09-19 LAB — GLUCOSE, CAPILLARY
GLUCOSE-CAPILLARY: 210 mg/dL — AB (ref 70–99)
GLUCOSE-CAPILLARY: 186 mg/dL — AB (ref 70–99)
Glucose-Capillary: 260 mg/dL — ABNORMAL HIGH (ref 70–99)
Glucose-Capillary: 201 mg/dL — ABNORMAL HIGH (ref 70–99)

## 2018-09-19 LAB — TYPE AND SCREEN
ABO/RH(D): A POS
ANTIBODY SCREEN: NEGATIVE

## 2018-09-19 SURGERY — COLECTOMY, PARTIAL, ROBOT-ASSISTED, LAPAROSCOPIC
Anesthesia: General | Site: Abdomen

## 2018-09-19 MED ORDER — FENTANYL CITRATE (PF) 100 MCG/2ML IJ SOLN
INTRAMUSCULAR | Status: AC
  Filled 2018-09-19: qty 2

## 2018-09-19 MED ORDER — LIDOCAINE 2% (20 MG/ML) 5 ML SYRINGE
INTRAMUSCULAR | Status: AC
  Filled 2018-09-19: qty 5

## 2018-09-19 MED ORDER — GUAIFENESIN-DM 100-10 MG/5ML PO SYRP: 10.0000 mL | ORAL_SOLUTION | ORAL | Status: DC | PRN

## 2018-09-19 MED ORDER — SODIUM CHLORIDE 0.9 % IV SOLN
INTRAVENOUS | Status: DC | PRN
  Administered 2018-09-19: 1000 mL via INTRAPERITONEAL

## 2018-09-19 MED ORDER — BUPIVACAINE-EPINEPHRINE (PF) 0.25% -1:200000 IJ SOLN
INTRAMUSCULAR | Status: DC | PRN
  Administered 2018-09-19: 60 mL

## 2018-09-19 MED ORDER — ALUM & MAG HYDROXIDE-SIMETH 200-200-20 MG/5ML PO SUSP
30.0000 mL | Freq: Four times a day (QID) | ORAL | Status: DC | PRN
  Administered 2018-09-20: 30 mL via ORAL
  Filled 2018-09-19: qty 30

## 2018-09-19 MED ORDER — PROPOFOL 10 MG/ML IV BOLUS
INTRAVENOUS | Status: AC
  Filled 2018-09-19: qty 20

## 2018-09-19 MED ORDER — BUPIVACAINE-EPINEPHRINE (PF) 0.25% -1:200000 IJ SOLN
INTRAMUSCULAR | Status: AC
  Filled 2018-09-19: qty 60

## 2018-09-19 MED ORDER — MIDAZOLAM HCL 2 MG/2ML IJ SOLN
INTRAMUSCULAR | Status: AC
  Filled 2018-09-19: qty 2

## 2018-09-19 MED ORDER — METOPROLOL TARTRATE 25 MG PO TABS
25.0000 mg | ORAL_TABLET | Freq: Two times a day (BID) | ORAL | Status: DC
  Administered 2018-09-19 – 2018-09-23 (×8): 25 mg via ORAL
  Filled 2018-09-19 (×8): qty 1

## 2018-09-19 MED ORDER — PHENYLEPHRINE 40 MCG/ML (10ML) SYRINGE FOR IV PUSH (FOR BLOOD PRESSURE SUPPORT)
PREFILLED_SYRINGE | INTRAVENOUS | Status: AC
  Filled 2018-09-19: qty 10

## 2018-09-19 MED ORDER — SODIUM CHLORIDE 0.9 % IV SOLN
INTRAVENOUS | Status: DC
  Filled 2018-09-19: qty 6

## 2018-09-19 MED ORDER — VITAMIN B-12 1000 MCG PO TABS
1000.0000 ug | ORAL_TABLET | Freq: Every day | ORAL | Status: DC
  Administered 2018-09-19 – 2018-09-23 (×5): 1000 ug via ORAL
  Filled 2018-09-19 (×5): qty 1

## 2018-09-19 MED ORDER — METOPROLOL TARTRATE 5 MG/5ML IV SOLN: 5.0000 mg | Freq: Four times a day (QID) | INTRAVENOUS | Status: DC | PRN

## 2018-09-19 MED ORDER — NITROGLYCERIN 0.4 MG SL SUBL: 0.4000 mg | SUBLINGUAL_TABLET | SUBLINGUAL | Status: DC | PRN

## 2018-09-19 MED ORDER — PROCHLORPERAZINE MALEATE 10 MG PO TABS: 10.0000 mg | ORAL_TABLET | Freq: Four times a day (QID) | ORAL | Status: DC | PRN

## 2018-09-19 MED ORDER — VITAMIN D 25 MCG (1000 UNIT) PO TABS
1000.0000 [IU] | ORAL_TABLET | Freq: Every day | ORAL | Status: DC
  Administered 2018-09-20 – 2018-09-23 (×4): 1000 [IU] via ORAL
  Filled 2018-09-19 (×4): qty 1

## 2018-09-19 MED ORDER — ONDANSETRON HCL 4 MG PO TABS: 4.0000 mg | ORAL_TABLET | Freq: Four times a day (QID) | ORAL | Status: DC | PRN

## 2018-09-19 MED ORDER — MENTHOL 3 MG MT LOZG: 1.0000 | LOZENGE | OROMUCOSAL | Status: DC | PRN

## 2018-09-19 MED ORDER — LIDOCAINE 2% (20 MG/ML) 5 ML SYRINGE
INTRAMUSCULAR | Status: DC | PRN
  Administered 2018-09-19: 60 mg via INTRAVENOUS

## 2018-09-19 MED ORDER — METOCLOPRAMIDE HCL 5 MG/ML IJ SOLN: 10.0000 mg | Freq: Four times a day (QID) | INTRAMUSCULAR | Status: DC | PRN

## 2018-09-19 MED ORDER — ALVIMOPAN 12 MG PO CAPS
12.0000 mg | ORAL_CAPSULE | ORAL | Status: AC
  Administered 2018-09-19: 12 mg via ORAL
  Filled 2018-09-19: qty 1

## 2018-09-19 MED ORDER — SUGAMMADEX SODIUM 200 MG/2ML IV SOLN
INTRAVENOUS | Status: DC | PRN
  Administered 2018-09-19: 200 mg via INTRAVENOUS

## 2018-09-19 MED ORDER — PROPOFOL 10 MG/ML IV BOLUS
INTRAVENOUS | Status: DC | PRN
  Administered 2018-09-19: 40 mg via INTRAVENOUS
  Administered 2018-09-19: 140 mg via INTRAVENOUS

## 2018-09-19 MED ORDER — ONDANSETRON HCL 4 MG/2ML IJ SOLN
4.0000 mg | Freq: Four times a day (QID) | INTRAMUSCULAR | Status: DC | PRN
  Administered 2018-09-19: 4 mg via INTRAVENOUS
  Filled 2018-09-19: qty 2

## 2018-09-19 MED ORDER — ENOXAPARIN SODIUM 40 MG/0.4ML ~~LOC~~ SOLN
40.0000 mg | SUBCUTANEOUS | Status: DC
  Administered 2018-09-20 – 2018-09-23 (×4): 40 mg via SUBCUTANEOUS
  Filled 2018-09-19 (×3): qty 0.4

## 2018-09-19 MED ORDER — MAGIC MOUTHWASH
15.0000 mL | Freq: Four times a day (QID) | ORAL | Status: DC | PRN
  Filled 2018-09-19: qty 15

## 2018-09-19 MED ORDER — LIDOCAINE 2% (20 MG/ML) 5 ML SYRINGE
INTRAMUSCULAR | Status: DC | PRN
  Administered 2018-09-19: 1 mg/kg/h via INTRAVENOUS

## 2018-09-19 MED ORDER — DEXAMETHASONE SODIUM PHOSPHATE 4 MG/ML IJ SOLN
INTRAMUSCULAR | Status: DC | PRN
  Administered 2018-09-19: 4 mg via INTRAVENOUS

## 2018-09-19 MED ORDER — SODIUM CHLORIDE 0.9 % IV SOLN
2.0000 g | Freq: Two times a day (BID) | INTRAVENOUS | Status: AC
  Administered 2018-09-19: 2 g via INTRAVENOUS
  Filled 2018-09-19: qty 2

## 2018-09-19 MED ORDER — HYDROMORPHONE HCL 1 MG/ML IJ SOLN
0.5000 mg | INTRAMUSCULAR | Status: DC | PRN
  Administered 2018-09-19 – 2018-09-20 (×2): 1 mg via INTRAVENOUS
  Filled 2018-09-19 (×2): qty 1

## 2018-09-19 MED ORDER — DIPHENHYDRAMINE HCL 12.5 MG/5ML PO ELIX: 12.5000 mg | ORAL_SOLUTION | Freq: Four times a day (QID) | ORAL | Status: DC | PRN

## 2018-09-19 MED ORDER — GABAPENTIN 300 MG PO CAPS
300.0000 mg | ORAL_CAPSULE | Freq: Two times a day (BID) | ORAL | Status: DC
  Administered 2018-09-19 – 2018-09-23 (×9): 300 mg via ORAL
  Filled 2018-09-19 (×9): qty 1

## 2018-09-19 MED ORDER — PHENOL 1.4 % MT LIQD: 1.0000 | OROMUCOSAL | Status: DC | PRN

## 2018-09-19 MED ORDER — PANTOPRAZOLE SODIUM 40 MG PO TBEC
40.0000 mg | DELAYED_RELEASE_TABLET | Freq: Every day | ORAL | Status: DC
  Administered 2018-09-20 – 2018-09-23 (×4): 40 mg via ORAL
  Filled 2018-09-19 (×4): qty 1

## 2018-09-19 MED ORDER — ALVIMOPAN 12 MG PO CAPS
12.0000 mg | ORAL_CAPSULE | Freq: Two times a day (BID) | ORAL | Status: DC
  Administered 2018-09-20: 12 mg via ORAL
  Filled 2018-09-19 (×2): qty 1

## 2018-09-19 MED ORDER — ONDANSETRON HCL 4 MG/2ML IJ SOLN
INTRAMUSCULAR | Status: AC
  Filled 2018-09-19: qty 2

## 2018-09-19 MED ORDER — HYDRALAZINE HCL 20 MG/ML IJ SOLN: 10.0000 mg | INTRAMUSCULAR | Status: DC | PRN

## 2018-09-19 MED ORDER — SODIUM CHLORIDE 0.9 % IV SOLN
INTRAVENOUS | Status: DC | PRN
  Administered 2018-09-19: 250 mL via INTRAVENOUS

## 2018-09-19 MED ORDER — HYDROCORTISONE 1 % EX CREA: 1.0000 "application " | TOPICAL_CREAM | Freq: Three times a day (TID) | CUTANEOUS | Status: DC | PRN

## 2018-09-19 MED ORDER — GABAPENTIN 300 MG PO CAPS
300.0000 mg | ORAL_CAPSULE | ORAL | Status: AC
  Administered 2018-09-19: 300 mg via ORAL
  Filled 2018-09-19: qty 1

## 2018-09-19 MED ORDER — ACETAMINOPHEN 500 MG PO TABS
1000.0000 mg | ORAL_TABLET | ORAL | Status: AC
  Administered 2018-09-19: 1000 mg via ORAL
  Filled 2018-09-19: qty 2

## 2018-09-19 MED ORDER — GLYCOPYRROLATE PF 0.2 MG/ML IJ SOSY
PREFILLED_SYRINGE | INTRAMUSCULAR | Status: AC
  Filled 2018-09-19: qty 1

## 2018-09-19 MED ORDER — ROCURONIUM BROMIDE 10 MG/ML (PF) SYRINGE
PREFILLED_SYRINGE | INTRAVENOUS | Status: AC
  Filled 2018-09-19: qty 10

## 2018-09-19 MED ORDER — MIDAZOLAM HCL 5 MG/5ML IJ SOLN
INTRAMUSCULAR | Status: DC | PRN
  Administered 2018-09-19: 2 mg via INTRAVENOUS

## 2018-09-19 MED ORDER — HYDROCORTISONE 2.5 % RE CREA: 1.0000 "application " | TOPICAL_CREAM | Freq: Four times a day (QID) | RECTAL | Status: DC | PRN

## 2018-09-19 MED ORDER — LIDOCAINE HCL 2 % IJ SOLN
INTRAMUSCULAR | Status: AC
  Filled 2018-09-19: qty 20

## 2018-09-19 MED ORDER — FENTANYL CITRATE (PF) 100 MCG/2ML IJ SOLN: 25.0000 ug | INTRAMUSCULAR | Status: DC | PRN

## 2018-09-19 MED ORDER — KETAMINE HCL 10 MG/ML IJ SOLN
INTRAMUSCULAR | Status: AC
  Filled 2018-09-19: qty 1

## 2018-09-19 MED ORDER — ATORVASTATIN CALCIUM 10 MG PO TABS
10.0000 mg | ORAL_TABLET | Freq: Every day | ORAL | Status: DC
  Administered 2018-09-19 – 2018-09-22 (×4): 10 mg via ORAL
  Filled 2018-09-19 (×4): qty 1

## 2018-09-19 MED ORDER — ENOXAPARIN SODIUM 40 MG/0.4ML ~~LOC~~ SOLN
40.0000 mg | Freq: Once | SUBCUTANEOUS | Status: AC
  Administered 2018-09-19: 40 mg via SUBCUTANEOUS
  Filled 2018-09-19: qty 0.4

## 2018-09-19 MED ORDER — LACTATED RINGERS IV SOLN
INTRAVENOUS | Status: DC
  Administered 2018-09-19: 15:00:00 via INTRAVENOUS
  Administered 2018-09-19: 50 mL/h via INTRAVENOUS

## 2018-09-19 MED ORDER — ENSURE SURGERY PO LIQD
237.0000 mL | Freq: Two times a day (BID) | ORAL | Status: DC
  Administered 2018-09-20 – 2018-09-21 (×3): 237 mL via ORAL
  Filled 2018-09-19 (×9): qty 237

## 2018-09-19 MED ORDER — PROCHLORPERAZINE EDISYLATE 10 MG/2ML IJ SOLN: 5.0000 mg | Freq: Four times a day (QID) | INTRAMUSCULAR | Status: DC | PRN

## 2018-09-19 MED ORDER — ROCURONIUM BROMIDE 10 MG/ML (PF) SYRINGE
PREFILLED_SYRINGE | INTRAVENOUS | Status: DC | PRN
  Administered 2018-09-19: 20 mg via INTRAVENOUS
  Administered 2018-09-19: 80 mg via INTRAVENOUS

## 2018-09-19 MED ORDER — EPHEDRINE SULFATE-NACL 50-0.9 MG/10ML-% IV SOSY
PREFILLED_SYRINGE | INTRAVENOUS | Status: DC | PRN
  Administered 2018-09-19: 10 mg via INTRAVENOUS
  Administered 2018-09-19: 15 mg via INTRAVENOUS

## 2018-09-19 MED ORDER — ASPIRIN EC 81 MG PO TBEC: 81.0000 mg | DELAYED_RELEASE_TABLET | Freq: Every day | ORAL | Status: DC

## 2018-09-19 MED ORDER — FLUORESCEIN SODIUM 10 % IV SOLN
INTRAVENOUS | Status: DC | PRN
  Administered 2018-09-19: 3 mL via INTRAVENOUS

## 2018-09-19 MED ORDER — FENTANYL CITRATE (PF) 100 MCG/2ML IJ SOLN
INTRAMUSCULAR | Status: DC | PRN
  Administered 2018-09-19: 50 ug via INTRAVENOUS
  Administered 2018-09-19 (×2): 100 ug via INTRAVENOUS
  Administered 2018-09-19 (×3): 50 ug via INTRAVENOUS
  Administered 2018-09-19: 100 ug via INTRAVENOUS
  Administered 2018-09-19: 50 ug via INTRAVENOUS

## 2018-09-19 MED ORDER — KETAMINE HCL 10 MG/ML IJ SOLN
INTRAMUSCULAR | Status: DC | PRN
  Administered 2018-09-19: 20 mg via INTRAVENOUS

## 2018-09-19 MED ORDER — SACCHAROMYCES BOULARDII 250 MG PO CAPS
250.0000 mg | ORAL_CAPSULE | Freq: Two times a day (BID) | ORAL | Status: DC
  Administered 2018-09-19 – 2018-09-23 (×9): 250 mg via ORAL
  Filled 2018-09-19 (×9): qty 1

## 2018-09-19 MED ORDER — INSULIN ASPART 100 UNIT/ML ~~LOC~~ SOLN
0.0000 [IU] | Freq: Every day | SUBCUTANEOUS | Status: DC
  Administered 2018-09-19 – 2018-09-20 (×2): 2 [IU] via SUBCUTANEOUS

## 2018-09-19 MED ORDER — ACETAMINOPHEN 500 MG PO TABS
1000.0000 mg | ORAL_TABLET | Freq: Four times a day (QID) | ORAL | Status: DC
  Administered 2018-09-19 – 2018-09-23 (×11): 1000 mg via ORAL
  Filled 2018-09-19 (×12): qty 2

## 2018-09-19 MED ORDER — TRAMADOL HCL 50 MG PO TABS
50.0000 mg | ORAL_TABLET | Freq: Four times a day (QID) | ORAL | Status: DC | PRN
  Administered 2018-09-20 – 2018-09-21 (×2): 50 mg via ORAL
  Filled 2018-09-19 (×2): qty 1

## 2018-09-19 MED ORDER — GLIPIZIDE 5 MG PO TABS
15.0000 mg | ORAL_TABLET | Freq: Two times a day (BID) | ORAL | Status: DC
  Administered 2018-09-19 – 2018-09-23 (×8): 15 mg via ORAL
  Filled 2018-09-19 (×9): qty 1

## 2018-09-19 MED ORDER — BUPIVACAINE LIPOSOME 1.3 % IJ SUSP
INTRAMUSCULAR | Status: DC | PRN
  Administered 2018-09-19: 20 mL

## 2018-09-19 MED ORDER — SUGAMMADEX SODIUM 500 MG/5ML IV SOLN
INTRAVENOUS | Status: AC
  Filled 2018-09-19: qty 5

## 2018-09-19 MED ORDER — LACTATED RINGERS IV SOLN
INTRAVENOUS | Status: DC | PRN
  Administered 2018-09-19 (×3): via INTRAVENOUS

## 2018-09-19 MED ORDER — 0.9 % SODIUM CHLORIDE (POUR BTL) OPTIME
TOPICAL | Status: DC | PRN
  Administered 2018-09-19: 2000 mL

## 2018-09-19 MED ORDER — SODIUM CHLORIDE 0.9 % IV SOLN: 1000.0000 mL | Freq: Three times a day (TID) | INTRAVENOUS | Status: AC | PRN

## 2018-09-19 MED ORDER — LEVOTHYROXINE SODIUM 88 MCG PO TABS
88.0000 ug | ORAL_TABLET | Freq: Every day | ORAL | Status: DC
  Administered 2018-09-20 – 2018-09-23 (×4): 88 ug via ORAL
  Filled 2018-09-19 (×4): qty 1

## 2018-09-19 MED ORDER — FENTANYL CITRATE (PF) 250 MCG/5ML IJ SOLN
INTRAMUSCULAR | Status: AC
  Filled 2018-09-19: qty 5

## 2018-09-19 MED ORDER — ONDANSETRON HCL 4 MG/2ML IJ SOLN
INTRAMUSCULAR | Status: DC | PRN
  Administered 2018-09-19: 4 mg via INTRAVENOUS

## 2018-09-19 MED ORDER — DEXAMETHASONE SODIUM PHOSPHATE 10 MG/ML IJ SOLN
INTRAMUSCULAR | Status: AC
  Filled 2018-09-19: qty 1

## 2018-09-19 MED ORDER — ASPIRIN EC 81 MG PO TBEC
81.0000 mg | DELAYED_RELEASE_TABLET | Freq: Every day | ORAL | Status: DC
  Administered 2018-09-20 – 2018-09-23 (×4): 81 mg via ORAL
  Filled 2018-09-19 (×4): qty 1

## 2018-09-19 MED ORDER — DIPHENHYDRAMINE HCL 50 MG/ML IJ SOLN: 12.5000 mg | Freq: Four times a day (QID) | INTRAMUSCULAR | Status: DC | PRN

## 2018-09-19 MED ORDER — SUGAMMADEX SODIUM 200 MG/2ML IV SOLN
INTRAVENOUS | Status: AC
  Filled 2018-09-19: qty 2

## 2018-09-19 MED ORDER — LIP MEDEX EX OINT
1.0000 "application " | TOPICAL_OINTMENT | Freq: Two times a day (BID) | CUTANEOUS | Status: DC
  Administered 2018-09-19 – 2018-09-22 (×7): 1 via TOPICAL
  Filled 2018-09-19: qty 7

## 2018-09-19 MED ORDER — SODIUM CHLORIDE 0.9 % IV SOLN
2.0000 g | INTRAVENOUS | Status: AC
  Administered 2018-09-19: 2 g via INTRAVENOUS
  Filled 2018-09-19: qty 2

## 2018-09-19 MED ORDER — INSULIN ASPART 100 UNIT/ML ~~LOC~~ SOLN
0.0000 [IU] | Freq: Three times a day (TID) | SUBCUTANEOUS | Status: DC
  Administered 2018-09-19 – 2018-09-20 (×2): 3 [IU] via SUBCUTANEOUS
  Administered 2018-09-20: 5 [IU] via SUBCUTANEOUS
  Administered 2018-09-20 – 2018-09-21 (×2): 3 [IU] via SUBCUTANEOUS
  Administered 2018-09-21 (×2): 5 [IU] via SUBCUTANEOUS
  Administered 2018-09-22 – 2018-09-23 (×4): 3 [IU] via SUBCUTANEOUS

## 2018-09-19 SURGICAL SUPPLY — 114 items
APPLIER CLIP 5 13 M/L LIGAMAX5 (MISCELLANEOUS)
APPLIER CLIP ROT 10 11.4 M/L (STAPLE)
APR CLP MED LRG 11.4X10 (STAPLE)
APR CLP MED LRG 5 ANG JAW (MISCELLANEOUS)
BLADE EXTENDED COATED 6.5IN (ELECTRODE) ×4 IMPLANT
CANNULA REDUC XI 12-8 STAPL (CANNULA) ×1
CANNULA REDUC XI 12-8MM STAPL (CANNULA) ×1
CANNULA REDUCER 12-8 DVNC XI (CANNULA) ×2 IMPLANT
CELLS DAT CNTRL 66122 CELL SVR (MISCELLANEOUS) IMPLANT
CHLORAPREP W/TINT 26ML (MISCELLANEOUS) ×4 IMPLANT
CLIP APPLIE 5 13 M/L LIGAMAX5 (MISCELLANEOUS) IMPLANT
CLIP APPLIE ROT 10 11.4 M/L (STAPLE) IMPLANT
CLIP VESOLOCK LG 6/CT PURPLE (CLIP) IMPLANT
CLIP VESOLOCK MED LG 6/CT (CLIP) IMPLANT
COVER SURGICAL LIGHT HANDLE (MISCELLANEOUS) ×8 IMPLANT
COVER TIP SHEARS 8 DVNC (MISCELLANEOUS) ×2 IMPLANT
COVER TIP SHEARS 8MM DA VINCI (MISCELLANEOUS) ×2
COVER WAND RF STERILE (DRAPES) ×4 IMPLANT
DECANTER SPIKE VIAL GLASS SM (MISCELLANEOUS) ×8 IMPLANT
DEVICE TROCAR PUNCTURE CLOSURE (ENDOMECHANICALS) IMPLANT
DRAIN CHANNEL 19F RND (DRAIN) ×4 IMPLANT
DRAPE ARM DVNC X/XI (DISPOSABLE) ×8 IMPLANT
DRAPE COLUMN DVNC XI (DISPOSABLE) ×2 IMPLANT
DRAPE DA VINCI XI ARM (DISPOSABLE) ×8
DRAPE DA VINCI XI COLUMN (DISPOSABLE) ×2
DRAPE SURG IRRIG POUCH 19X23 (DRAPES) ×4 IMPLANT
DRSG OPSITE POSTOP 4X10 (GAUZE/BANDAGES/DRESSINGS) IMPLANT
DRSG OPSITE POSTOP 4X6 (GAUZE/BANDAGES/DRESSINGS) IMPLANT
DRSG OPSITE POSTOP 4X8 (GAUZE/BANDAGES/DRESSINGS) ×4 IMPLANT
DRSG TEGADERM 2-3/8X2-3/4 SM (GAUZE/BANDAGES/DRESSINGS) ×20 IMPLANT
DRSG TEGADERM 4X4.75 (GAUZE/BANDAGES/DRESSINGS) IMPLANT
ELECT PENCIL ROCKER SW 15FT (MISCELLANEOUS) ×4 IMPLANT
ELECT REM PT RETURN 15FT ADLT (MISCELLANEOUS) ×4 IMPLANT
ENDOLOOP SUT PDS II  0 18 (SUTURE)
ENDOLOOP SUT PDS II 0 18 (SUTURE) IMPLANT
EVACUATOR SILICONE 100CC (DRAIN) ×4 IMPLANT
GAUZE SPONGE 2X2 8PLY STRL LF (GAUZE/BANDAGES/DRESSINGS) ×2 IMPLANT
GAUZE SPONGE 4X4 12PLY STRL (GAUZE/BANDAGES/DRESSINGS) IMPLANT
GLOVE BIO SURGEON STRL SZ 6.5 (GLOVE) ×6 IMPLANT
GLOVE BIO SURGEON STRL SZ7.5 (GLOVE) ×8 IMPLANT
GLOVE BIO SURGEONS STRL SZ 6.5 (GLOVE) ×2
GLOVE BIOGEL PI IND STRL 6.5 (GLOVE) ×4 IMPLANT
GLOVE BIOGEL PI IND STRL 7.0 (GLOVE) ×8 IMPLANT
GLOVE BIOGEL PI INDICATOR 6.5 (GLOVE) ×4
GLOVE BIOGEL PI INDICATOR 7.0 (GLOVE) ×8
GLOVE ECLIPSE 8.0 STRL XLNG CF (GLOVE) ×12 IMPLANT
GLOVE INDICATOR 7.5 STRL GRN (GLOVE) ×8 IMPLANT
GLOVE INDICATOR 8.0 STRL GRN (GLOVE) ×12 IMPLANT
GOWN STRL REUS W/ TWL LRG LVL3 (GOWN DISPOSABLE) ×4 IMPLANT
GOWN STRL REUS W/TWL LRG LVL3 (GOWN DISPOSABLE) ×8
GOWN STRL REUS W/TWL XL LVL3 (GOWN DISPOSABLE) ×28 IMPLANT
GRASPER SUT TROCAR 14GX15 (MISCELLANEOUS) ×4 IMPLANT
HOLDER FOLEY CATH W/STRAP (MISCELLANEOUS) ×4 IMPLANT
IRRIG SUCT STRYKERFLOW 2 WTIP (MISCELLANEOUS)
IRRIGATION SUCT STRKRFLW 2 WTP (MISCELLANEOUS) IMPLANT
IRRIGATOR SUCT 8 DISP DVNC XI (IRRIGATION / IRRIGATOR) IMPLANT
IRRIGATOR SUCTION 8MM XI DISP (IRRIGATION / IRRIGATOR)
KIT PROCEDURE DA VINCI SI (MISCELLANEOUS) ×2
KIT PROCEDURE DVNC SI (MISCELLANEOUS) ×2 IMPLANT
KIT SIGMOIDOSCOPE (SET/KITS/TRAYS/PACK) ×4 IMPLANT
LUBRICANT JELLY K Y 4OZ (MISCELLANEOUS) ×4 IMPLANT
NEEDLE INSUFFLATION 14GA 120MM (NEEDLE) ×4 IMPLANT
PACK CARDIOVASCULAR III (CUSTOM PROCEDURE TRAY) ×4 IMPLANT
PACK COLON (CUSTOM PROCEDURE TRAY) ×4 IMPLANT
PAD POSITIONING PINK XL (MISCELLANEOUS) ×4 IMPLANT
PORT LAP GEL ALEXIS MED 5-9CM (MISCELLANEOUS) ×4 IMPLANT
POSITIONER SURGICAL ARM (MISCELLANEOUS) ×8 IMPLANT
RTRCTR WOUND ALEXIS 18CM MED (MISCELLANEOUS)
SCISSORS LAP 5X35 DISP (ENDOMECHANICALS) ×4 IMPLANT
SEAL CANN UNIV 5-8 DVNC XI (MISCELLANEOUS) ×8 IMPLANT
SEAL XI 5MM-8MM UNIVERSAL (MISCELLANEOUS) ×8
SEALER VESSEL DA VINCI XI (MISCELLANEOUS) ×2
SEALER VESSEL EXT DVNC XI (MISCELLANEOUS) ×2 IMPLANT
SLEEVE ADV FIXATION 5X100MM (TROCAR) IMPLANT
SOLUTION ELECTROLUBE (MISCELLANEOUS) ×4 IMPLANT
SPONGE GAUZE 2X2 STER 10/PKG (GAUZE/BANDAGES/DRESSINGS) ×2
STAPLER 45 BLU RELOAD XI (STAPLE) IMPLANT
STAPLER 45 BLUE RELOAD XI (STAPLE)
STAPLER 45 GREEN RELOAD XI (STAPLE) ×4
STAPLER 45 GRN RELOAD XI (STAPLE) ×4 IMPLANT
STAPLER CANNULA SEAL DVNC XI (STAPLE) ×2 IMPLANT
STAPLER CANNULA SEAL XI (STAPLE) ×2
STAPLER ECHELON POWER CIR 31 (STAPLE) ×4 IMPLANT
STAPLER SHEATH (SHEATH) ×2
STAPLER SHEATH ENDOWRIST DVNC (SHEATH) ×2 IMPLANT
SUT MNCRL AB 4-0 PS2 18 (SUTURE) ×8 IMPLANT
SUT PDS AB 1 CTX 36 (SUTURE) ×8 IMPLANT
SUT PDS AB 1 TP1 96 (SUTURE) IMPLANT
SUT PDS AB 2-0 CT2 27 (SUTURE) IMPLANT
SUT PROLENE 0 CT 2 (SUTURE) ×4 IMPLANT
SUT PROLENE 2 0 KS (SUTURE) ×4 IMPLANT
SUT PROLENE 2 0 SH DA (SUTURE) IMPLANT
SUT SILK 2 0 (SUTURE) ×4
SUT SILK 2 0 SH CR/8 (SUTURE) ×4 IMPLANT
SUT SILK 2-0 18XBRD TIE 12 (SUTURE) ×2 IMPLANT
SUT SILK 3 0 (SUTURE) ×4
SUT SILK 3 0 SH CR/8 (SUTURE) ×4 IMPLANT
SUT SILK 3-0 18XBRD TIE 12 (SUTURE) ×2 IMPLANT
SUT V-LOC BARB 180 2/0GR6 GS22 (SUTURE)
SUT VIC AB 3-0 SH 18 (SUTURE) ×4 IMPLANT
SUT VIC AB 3-0 SH 27 (SUTURE) ×4
SUT VIC AB 3-0 SH 27XBRD (SUTURE) ×2 IMPLANT
SUT VICRYL 0 UR6 27IN ABS (SUTURE) ×4 IMPLANT
SUTURE V-LC BRB 180 2/0GR6GS22 (SUTURE) IMPLANT
SYR 10ML LL (SYRINGE) ×4 IMPLANT
SYS LAPSCP GELPORT 120MM (MISCELLANEOUS)
SYSTEM LAPSCP GELPORT 120MM (MISCELLANEOUS) IMPLANT
TAPE UMBILICAL COTTON 1/8X30 (MISCELLANEOUS) ×4 IMPLANT
TOWEL OR NON WOVEN STRL DISP B (DISPOSABLE) ×4 IMPLANT
TRAY FOLEY MTR SLVR 16FR STAT (SET/KITS/TRAYS/PACK) ×4 IMPLANT
TROCAR ADV FIXATION 5X100MM (TROCAR) ×4 IMPLANT
TUBING CONNECTING 10 (TUBING) ×6 IMPLANT
TUBING CONNECTING 10' (TUBING) ×2
TUBING INSUFFLATION 10FT LAP (TUBING) ×4 IMPLANT

## 2018-09-19 NOTE — H&P (Addendum)
[name] [name] DOB: 1945/11/13   Patient Care Team: Leonard Downing, MD as PCP - General (Family Medicine) Burnell Blanks, MD as PCP - Cardiology (Cardiology) Michael Boston, MD as Consulting Physician (General Surgery) Wilford Corner, MD as Consulting Physician (Gastroenterology)  ` Patient sent for surgical consultation at the request of Dr Cannon Kettle  Chief Complaint: New descending colon cancer ` ` The patient is a pleasant gentleman here with his wife. History of diabetes on oral hypoglycemics. Coronary artery disease status post CABG 2001. No major issues since. Was found to have a positive colon cancer screening test. Positive FIT. Underwent upper and lower endoscopy. History of some mild epigastric discomfort. Upper endoscopy complete normal. Colonoscopy revealed left-sided colon polyps and a mass 50 cm from the anus, suspicious for descending colon. Biopsy consistent with adenocarcinoma. Surgical consultation requested.  Patient denies any family history of colon cancer. Mother had polyps. No family history of breast/ovarian/prostate cancer. Usually moves his bowels every day or so. He had a laparoscopic cholecystectomy in the 1990s. No other abdominal surgeries. He is hard of hearing. Had episode of chest pain a few months ago with a negative perfusion cardiac study. He can walk about 20 minutes before he has to stop. He does do yardwork but does get short of breath when he mows for about 15/20 minutes. Last hemoglobin A1c 6.9. No history of infections. No personal nor family history of GI/colon cancer, inflammatory bowel disease, irritable bowel syndrome, allergy such as Celiac Sprue, dietary/dairy problems, colitis, ulcers nor gastritis. No recent sick contacts/gastroenteritis. No travel outside the country. No changes in diet. No dysphagia to solids or liquids. No significant heartburn or reflux. No hematochezia, hematemesis, coffee  ground emesis. No evidence of prior gastric/peptic ulceration.  NO new events.  Ready for surgery  (Review of systems as stated in this history (HPI) or in the review of systems. Otherwise all other 12 point ROS are negative) ` ` `   Past Surgical History (Tanisha A. Owens Shark, [place]; 08/12/2018 3:08 PM) Coronary Artery Bypass Graft Gallbladder Surgery - Laparoscopic Oral Surgery Shoulder Surgery Right. Spinal Surgery - Neck  Allergies (Tanisha A. Owens Shark, [place]; 08/12/2018 3:09 PM) No Known Drug Allergies [08/12/2018]: Allergies Reconciled  Medication History (Tanisha A. Owens Shark, [place]; 08/12/2018 3:10 PM) Atorvastatin Calcium (10MG  Tablet, Oral) Active. glipiZIDE (5MG  Tablet, Oral) Active. Levothyroxine Sodium (125MCG Tablet, Oral) Active. Pantoprazole Sodium (40MG  Tablet DR, Oral) Active. Metoprolol Succinate ER (50MG  Tablet ER 24HR, Oral) Active. Aspirin (81MG  Tablet, Oral) Active. [facility] Vitamin B12 (1000MCG Tablet, Oral) Active. Vitamin D3 (Oral) Specific strength unknown - Active. Medications Reconciled  Social History (Tanisha A. Owens Shark, [place]; 08/12/2018 3:08 PM) Caffeine use Carbonated beverages, Tea. No alcohol use No drug use Tobacco use Former smoker.  Family History (Tanisha A. Owens Shark, [place]; 08/12/2018 3:08 PM) Alcohol Abuse Daughter. Arthritis Mother. Bleeding disorder Mother. Colon Polyps Mother. Diabetes Mellitus Father, Mother. Heart Disease Mother.  Other Problems (Tanisha A. Owens Shark, [place]; 08/12/2018 3:08 PM) Back Pain Bladder Problems Chest pain Cholelithiasis Colon Cancer Congestive Heart Failure Depression Diabetes Mellitus Gastroesophageal Reflux Disease Hypercholesterolemia Myocardial infarction Thyroid Disease     Review of Systems (Tanisha A. Brown RMA; 08/12/2018 3:08 PM) General Present- Appetite Loss, Fatigue, Night Sweats and Weight Loss. Not Present- Chills, Fever and Weight Gain. Skin Not Present-  Change in Wart/Mole, Dryness, Hives, Jaundice, New Lesions, Non-Healing Wounds, Rash and Ulcer. HEENT Present- Hearing Loss, Hoarseness, Ringing in the Ears and Wears glasses/contact lenses. Not Present- Earache, Nose Bleed, Oral Ulcers,  Seasonal Allergies, Sinus Pain, Sore Throat, Visual Disturbances and Yellow Eyes. Cardiovascular Present- Leg Cramps. Not Present- Chest Pain, Difficulty Breathing Lying Down, Palpitations, Rapid Heart Rate, Shortness of Breath and Swelling of Extremities. Gastrointestinal Present- Difficulty Swallowing, Gets full quickly at meals, Indigestion and Nausea. Not Present- Abdominal Pain, Bloating, Bloody Stool, Change in Bowel Habits, Chronic diarrhea, Constipation, Excessive gas, Hemorrhoids, Rectal Pain and Vomiting. Male Genitourinary Present- Urgency and Urine Leakage. Not Present- Blood in Urine, Change in Urinary Stream, Frequency, Impotence, Nocturia and Painful Urination.  Vitals (Tanisha A. Brown RMA; 08/12/2018 3:08 PM) 08/12/2018 3:08 PM Weight: 200.2 lb Height: 66in Body Surface Area: 2 m Body Mass Index: 32.31 kg/m  Temp.: 98.46F  Pulse: 72 (Regular)  BP: 128/76 (Sitting, Left Arm, Standard)    BP (!) 111/98   Pulse (!) 55   Temp 98.2 F (36.8 C) (Oral)   Resp 16   Ht 5\' 5"  (1.651 m)   Wt 91.4 kg   SpO2 99%   BMI 33.53 kg/m    Physical Exam [name] MD; 08/12/2018 3:47 PM)  General Mental Status-Alert. General Appearance-Not in acute distress, Not Sickly. Orientation-Oriented X3. Hydration-Well hydrated. Voice-Normal.  Integumentary Global Assessment Upon inspection and palpation of skin surfaces of the - Axillae: non-tender, no inflammation or ulceration, no drainage. and Distribution of scalp and body hair is normal. General Characteristics Temperature - normal warmth is noted.  Head and Neck Head-normocephalic, atraumatic with no lesions or palpable masses. Face Global Assessment -  atraumatic, no absence of expression. Neck Global Assessment - no abnormal movements, no bruit auscultated on the right, no bruit auscultated on the left, no decreased range of motion, non-tender. Trachea-midline. Thyroid Gland Characteristics - non-tender.  Eye Eyeball - Left-Extraocular movements intact, No Nystagmus. Eyeball - Right-Extraocular movements intact, No Nystagmus. Cornea - Left-No Hazy. Cornea - Right-No Hazy. Sclera/Conjunctiva - Left-No scleral icterus, No Discharge. Sclera/Conjunctiva - Right-No scleral icterus, No Discharge. Pupil - Left-Direct reaction to light normal. Pupil - Right-Direct reaction to light normal.  ENMT Ears Pinna - Left - no drainage observed, no generalized tenderness observed. Right - no drainage observed, no generalized tenderness observed. Nose and Sinuses External Inspection of the Nose - no destructive lesion observed. Inspection of the nares - Left - quiet respiration. Right - quiet respiration. Mouth and Throat Lips - Upper Lip - no fissures observed, no pallor noted. Lower Lip - no fissures observed, no pallor noted. Nasopharynx - no discharge present. Oral Cavity/Oropharynx - Tongue - no dryness observed. Oral Mucosa - no cyanosis observed. Hypopharynx - no evidence of airway distress observed. Note: Hard of hearing  Chest and Lung Exam Inspection Movements - Normal and Symmetrical. Accessory muscles - No use of accessory muscles in breathing. Palpation Palpation of the chest reveals - Non-tender. Auscultation Breath sounds - Normal and Clear.  Cardiovascular Auscultation Rhythm - Regular. Murmurs & Other Heart Sounds - Auscultation of the heart reveals - No Murmurs and No Systolic Clicks.  Abdomen Inspection Inspection of the abdomen reveals - No Visible peristalsis and No Abnormal pulsations. Umbilicus - No Bleeding, No Urine drainage. Palpation/Percussion Palpation and Percussion of the abdomen  reveal - Soft, Non Tender, No Rebound tenderness, No Rigidity (guarding) and No Cutaneous hyperesthesia. Note: Abdomen soft. Nontender. Small epigastric mass most likely small lipoma or tiny incarcerated subxiphoid port site hernia. Asymptomatic. Moderate diastases recti. No real discomfort today. Not distended. No umbilical or incisional hernias. No guarding.  Male Genitourinary Sexual Maturity Tanner 5 - Adult hair  pattern and Adult penile size and shape. Note: Mild impulse in right groin but no definite hernia. No lymphadenopathy. Normal external genitalia  Peripheral Vascular Upper Extremity Inspection - Left - No Cyanotic nailbeds, Not Ischemic. Right - No Cyanotic nailbeds, Not Ischemic.  Neurologic Neurologic evaluation reveals -normal attention span and ability to concentrate, able to name objects and repeat phrases. Appropriate fund of knowledge , normal sensation and normal coordination. Mental Status Affect - not angry, not paranoid. Cranial Nerves-Normal Bilaterally. Gait-Normal.  Neuropsychiatric Mental status exam performed with findings of-able to articulate well with normal speech/language, rate, volume and coherence, thought content normal with ability to perform basic computations and apply abstract reasoning and no evidence of hallucinations, delusions, obsessions or homicidal/suicidal ideation.  Musculoskeletal Global Assessment Spine, Ribs and Pelvis - no instability, subluxation or laxity. Right Upper Extremity - no instability, subluxation or laxity.  Lymphatic Head & Neck  General Head & Neck Lymphatics: Bilateral - Description - No Localized lymphadenopathy. Axillary  General Axillary Region: Bilateral - Description - No Localized lymphadenopathy. Femoral & Inguinal  Generalized Femoral & Inguinal Lymphatics: Left - Description - No Localized lymphadenopathy. Right - Description - No Localized lymphadenopathy.    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 Also left-sided colon polyps.  Complete workup. CT scan of chest/abdomen/pelvis. CEA. Rule out metastatic disease. Help localize where the primary is. I cannot definitely see a tumor on his prior CAT scan from 2016 ?mild thickening in the mid/distal descending colon.  Standard of care is segmental colonic resection. Minimally invasive approach. Hopefully should be able to do robotically. Ideally we will try and remove the cancer & all the polyps, but I would like to try and avoid a left hemicolectomy if possible.   The anatomy & physiology of the digestive tract was discussed. The pathophysiology of the colon was discussed. Natural history risks without surgery was discussed. I feel the risks of no intervention will lead to serious problems that outweigh the operative risks; therefore, I recommended a partial colectomy to remove the pathology. Minimally invasive (Robotic/Laparoscopic) & open techniques were discussed.  Risks such as bleeding, infection, abscess, leak, reoperation, possible ostomy, hernia, heart attack, death, and other risks were discussed. I noted a good likelihood this will help address the problem. Goals of post-operative recovery were discussed as well. Need for adequate nutrition, daily bowel regimen and healthy physical activity, to optimize recovery was noted as well. We will work to minimize complications. Educational materials were available as well. Questions were answered. The patient expresses understanding & wishes to proceed with surgery.    ADENOMATOUS POLYP OF TRANSVERSE COLON (D12.3)   ADENOMATOUS POLYP OF DESCENDING COLON (D12.4)   ADENOMATOUS POLYP OF SIGMOID COLON (D12.5)  Current Plans Pt Education - Polyps in the Colon and Rectum (Colonic and Rectal Polyps): colonic polyps  [name],  MD, FACS, MASCRS Gastrointestinal and Minimally Invasive Surgery    [address] [place], [place] [address] [phone] Main / Paging [phone] Fax

## 2018-09-19 NOTE — Progress Notes (Signed)
I have reviewed and concur with this student's documentation.   

## 2018-09-19 NOTE — Op Note (Signed)
09/19/2018  11:19 AM  PATIENT:  [name] [name]  72 y.o. male  Patient Care Team: Leonard Downing, MD as PCP - General (Family Medicine) Burnell Blanks, MD as PCP - Cardiology (Cardiology) Michael Boston, MD as Consulting Physician (General Surgery) Wilford Corner, MD as Consulting Physician (Gastroenterology)  PRE-OPERATIVE DIAGNOSIS:  COLON CANCER & POLYPS  POST-OPERATIVE DIAGNOSIS:   DESCENDING COLON CANCER DESCENDING COLON POLYPS  PROCEDURE:  XI ROBOT ASSISTED LEFT HEMICOLECTOMY MOBILIZATION OF SPLENIC FLEXURE FIREFLY IMMUNOFLUORESCENCE RIGID PROCTOSCOPY  SURGEON:  [name], MD   ASSISTANT: Nadeen Landau, MD   ANESTHESIA:   local and general  EBL:  Total I/O In: 2000 [I.V.:2000] Out: 450 [Urine:400; Blood:50]  Delay start of Pharmacological VTE agent (>24hrs) due to surgical blood loss or risk of bleeding:  no  DRAINS: none   SPECIMEN:  Left colon (open end proximal)  DISPOSITION OF SPECIMEN:  PATHOLOGY  COUNTS:  YES  PLAN OF CARE: Admit to inpatient   PATIENT DISPOSITION:  PACU - hemodynamically stable.  INDICATION:    Patient found to have positive colon cancer screening test.  Underwent colonoscopy.  Left-sided colon polyps noted.  Mass in descending colon suspected.  Biopsy suspicious for adenocarcinoma.  I recommended segmental resection:  The anatomy & physiology of the digestive tract was discussed.  The pathophysiology was discussed.  Natural history risks without surgery was discussed.   I worked to give an overview of the disease and the frequent need to have multispecialty involvement.  I feel the risks of no intervention will lead to serious problems that outweigh the operative risks; therefore, I recommended a partial colectomy to remove the pathology.  Laparoscopic & open techniques were discussed.   Risks such as bleeding, infection, abscess, leak, reoperation, possible ostomy, hernia, heart attack, death, and other  risks were discussed.  I noted a good likelihood this will help address the problem.   Goals of post-operative recovery were discussed as well.  We will work to minimize complications.  Educational materials on the pathology had been given in the office.  Questions were answered.    The patient expressed understanding & wished to proceed with surgery.  OR FINDINGS:   Patient had tattooing in the proximal descending colon just distal to the splenic flexure.  Also in the proximal sigmoid colon.  Firm nodular mass in proximal descending colon about 3 x 3 cm.  Consistent with the cancer.  No obvious metastatic disease on visceral parietal peritoneum or liver.  It is a mid transverse colon to rectal and and EEA anastomosis.  24 EEA the anastomosis rests 16 cm from the anal verge by rigid proctoscopy.  DESCRIPTION:   Informed consent was confirmed.  The patient underwent general anaesthesia without difficulty.  The patient was positioned appropriately.  VTE prevention in place.  The patient's abdomen was clipped, prepped, & draped in a sterile fashion.  Surgical timeout confirmed our plan.  The patient was positioned in reverse Trendelenburg.  Abdominal entry was gained using Varess technique on the anterior abdominal wall fascia in the left subcostal ridge with the stomach decompressed by orogastric tube.   I induced carbon dioxide insufflation.  Camera inspection revealed no injury.  Extra ports were carefully placed under direct laparoscopic visualization.  I did find tattooing at the distal descending colon.  I reflected the greater omentum and the upper abdomen the small bowel in the upper abdomen.  The patient was carefully positioned.  The Intuitive daVinci robot was carefully docked  with camera & instruments carefully placed.  The patient had some adhesions and colon thickening.  Ended up mobilizing the tortuous sigmoid colon in a lateral medial fashion to help untwisted and straighten it out.   I elevated the rectosigmoid mesentery.  I scored the base of peritoneum of the medial side of the mesentery of the left colon from the ligament of Treitz to the peritoneal reflection of the mid rectum.   I elevated the sigmoid mesentery and entered into the retro-mesenteric plane. We were able to identify the left ureter and gonadal vessels. We kept those posterior within the retroperitoneum and elevated the left colon mesentery off that. I did isolate the inferior mesenteric artery (IMA) pedicle but did not ligate it yet.  I continued distally and got into the avascular plane posterior to the mesorectum. This allowed me to help mobilize the rectum as well by freeing the mesorectum off the sacrum.  I mobilized the peritoneal coverings towards the peritoneal reflection on both the right and left sides of the rectum.  I stayed away from the right and left ureters.  I kept the lateral vascular pedicles to the rectum intact.  I skeletonized the lymph nodes off the inferior mesenteric artery pedicle.  I went down to its takeoff from the aorta.   I isolated the inferior mesenteric vein off of the ligament of Treitz just cephalad to that as well.  After confirming the left ureter was out of the way, I went ahead and ligated the inferior mesenteric artery pedicle just near its takeoff from the aorta.  I did ligate the inferior mesenteric vein in a similar fashion.  We ensured hemostasis. I skeletonized the mesorectum at the junction at the proximal rectum for the distal point of resection.  I transected through the proximal mesorectum to a nice soft area of proximal rectum just distal to the level of the sacral promontory.  I mobilized the left colon in a lateral to medial fashion off the line of Toldt up towards the splenic flexure to ensure good mobilization of the remaining left colon to reach into the pelvis.  In doing this I found the other tattoo at the descending colon just distal to the splenic flexure.   Therefore, felt like the patient would need splenic flexure mobilization.  I did mobilize the splenic flexure in a lateral to medial fashion, coming around the superficial attachments close to the splenic hilum.  I then transected the thickened and folded greater omentum off the mid transverse colon and continued more distally towards the splenic flexure.  I had to get into a deeper fold to get in the true lesser sac.  We then repositioned the patient to be in reverse Trendelenburg.  I elevated the transverse colon and splenic flexure mesentery.   I found the middle colic pedicle and created a window mesocolon just distal to that and radially came through the mid transverse colon mesocolon.  I then transected some of the distal transverse colon mesenteric attachments and splenic flexure attachments off the retroperitoneum as well.  Did some more retroperitoneal medial to lateral mobilization to help mobilize the splenic flexure and a medial to lateral fashion.  I then rolled the transverse colon more inferiorly.  Was able to find the mesentery of the transverse colon and transected off the retroperitoneum at the inferior pancreatic edge towards the splenic flexure.  With that I had complete mobilization of the entire left colon basically from the middle colic artery pedicle down to the  rectum.  This provided excellent splenic flexure mobilization.  The mid transverse colon could reach down to the sacral promontory.  I chose a region close to the splenic flexure and transected the mesentery radially to have at least 5 cm proximal to the most proximal tattooing.    Because of the splenic flexure mobilization, significant transection length, & his advanced age; I had anesthesia intravenously infuse Firefly immunofluorescent dye.  I was able to view the green dye light up the colon all the way up to the predesignated region of the distal transverse colon just proximal splenic flexure as the proximal transition  point.  Also saw good perfusion of the rectum as well.  That confirmed excellent perfusion of both ends of the planned anastomosis I transected at the proximal rectum using a robotic 45 mm stapler.  90% across with one firing.  Did another firing of the left lateral corner.  I created an extraction incision through a small Pfannenstiel incision in the suprapubic region.  Placed a wound protector.  I was able to eviscerate the rectosigmoid and descending colon out the wound.   I clamped the colon proximal to this area using a reusable pursestringer device.  Passed a 2-0 Keith needle. I transected at the descending/sigmoid junction with a scalpel. I got healthy bleeding mucosa.  We sent the rectosigmoid colon specimen off to go to pathology.  We sized the colon orifice.  I chose a 31 EEA anvil stapler system.  I reinforced the prolene pursestring with interrupted silk suture.  I placed the anvil to the open end of the proximal remaining colon and closed around it using the pursestring.    We did copious irrigation with crystalloid solution.  Hemostasis was good.  The distal end of the remaining colon easily reached down to the rectal stump, therefore, further colon mobilization was not done.  Dr Dema Severin scrubbed down and did gentle anal dilation and advanced the EEA stapler up the rectal stump. The spike was brought out at the provimal end of the rectal stump under direct visualization.  I attached the anvil of the proximal colon the spike of the stapler. Anvil was tightened down and held clamped for 60 seconds.   I mobilized some small bowel loops to the right side to confirm that the remaining colon mesentery was lying on the retroperitoneum straight and no small bowel trapped underneath it.  The EEA stapler was fired and held clamped for 30 seconds. The stapler was released & removed. We noted 2 excellent anastomotic rings. Blue stitch is in the proximal ring.  Dr Dema Severin did rigid proctoscopy noted the anastomosis  was at 16 cm from the anal verge consistent with the proximal rectum in this obese male.  We did a final irrigation of antibiotic solution (900 mg clindamycin/240 mg gentamicin in a liter of crystalloid) & held that for the pelvic air leak test .  The rectum was insufflated the rectum while clamping the colon proximal to that anastomosis.  There was a negative air leak test. There was no tension of mesentery or bowel at the anastomosis.   Tissues looked viable.  Ureters & bowel uninjured.  The anastomosis looked healthy.  Endoluminal gas was evacuated.  Ports & wound protector removed.  We changed gloves & redraped the patient per colon SSI prevention protocol.  We aspirated the antibiotic irrigation.  Hemostasis was good.  Sterile unused instruments were used from this point.  I closed the skin at the port sites using Monocryl  stitch and sterile dressing.  I closed the extraction wound using a 0 Vicryl vertical peritoneal closure and a #1 PDS transverse anterior rectal fascial closure like a small Pfannenstiel closure. I closed the skin with some interrupted Monocryl stitches. I placed antibiotic-soaked wicks into the closure at the corners x2.  I placed sterile dressings.     Patient is being extubated go to recovery room. I had discussed postop care with the patient in detail the office & in the holding area. Instructions are written. I discussed operative findings, updated the patient's status, discussed probable steps to recovery, and gave postoperative recommendations to the patient's family.  Recommendations were made.  Questions were answered.  They expressed understanding & appreciation.  [name], M.D., F.A.C.S. Gastrointestinal and Minimally Invasive Surgery [facility] [address] [place], [place] [address] [phone] Main / Paging

## 2018-09-19 NOTE — Addendum Note (Signed)
Addendum  created 09/19/18 1318 by Claudia Desanctis, CRNA   Charge Capture section accepted

## 2018-09-19 NOTE — Transfer of Care (Signed)
Immediate Anesthesia Transfer of Care Note  Patient: [name] [name]  Procedure(s) Performed: XI ROBOT ASSISTED LEFT HEMICOLOECTOMY, MOBILIZATION OF SPLENIC FLEXURE WITH FIREFLY (N/A Abdomen) RIGID PROCTOSCOPY (N/A )  Patient Location: PACU  Anesthesia Type:General  Level of Consciousness: awake and patient cooperative  Airway & Oxygen Therapy: Patient Spontanous Breathing and Patient connected to face mask  Post-op Assessment: Report given to RN and Post -op Vital signs reviewed and stable  Post vital signs: Reviewed and stable  Last Vitals:  Vitals Value Taken Time  BP    Temp    Pulse 69 09/19/2018 11:25 AM  Resp 15 09/19/2018 11:25 AM  SpO2 100 % 09/19/2018 11:25 AM  Vitals shown include unvalidated device data.  Last Pain:  Vitals:   09/19/18 0627  TempSrc:   PainSc: 0-No pain      Patients Stated Pain Goal: 4 (84/16/60 6301)  Complications: No apparent anesthesia complications

## 2018-09-19 NOTE — Progress Notes (Signed)
[name] Catholic, CRNA notified that pt cbg was 260 upon arrival to short stay. He will speak to anesthesia regarding this.

## 2018-09-19 NOTE — Anesthesia Postprocedure Evaluation (Signed)
Anesthesia Post Note  Patient: Jahmal Dunavant Telfair  Procedure(s) Performed: XI ROBOT ASSISTED LEFT HEMICOLECTOMY, MOBILIZATION OF SPLENIC FLEXURE WITH FIREFLY (N/A Abdomen) RIGID PROCTOSCOPY (N/A )     Patient location during evaluation: PACU Anesthesia Type: General Level of consciousness: awake Pain management: pain level controlled Vital Signs Assessment: post-procedure vital signs reviewed and stable Respiratory status: spontaneous breathing Cardiovascular status: stable Postop Assessment: no apparent nausea or vomiting Anesthetic complications: no    Last Vitals:  Vitals:   09/19/18 1145 09/19/18 1200  BP: 117/72 115/74  Pulse: 62 64  Resp: 10 13  Temp:    SpO2: 98% 98%    Last Pain:  Vitals:   09/19/18 1200  TempSrc:   PainSc: 0-No pain                 Graylon Amory

## 2018-09-19 NOTE — Anesthesia Procedure Notes (Signed)
Procedure Name: Intubation Date/Time: 09/19/2018 7:28 AM Performed by: Claudia Desanctis, CRNA Pre-anesthesia Checklist: Patient identified, Emergency Drugs available, Suction available and Patient being monitored Patient Re-evaluated:Patient Re-evaluated prior to induction Oxygen Delivery Method: Circle system utilized Preoxygenation: Pre-oxygenation with 100% oxygen Induction Type: IV induction Ventilation: Mask ventilation without difficulty Laryngoscope Size: 2 and Miller Grade View: Grade I Tube type: Oral Tube size: 7.5 mm Number of attempts: 1 Airway Equipment and Method: Stylet Placement Confirmation: ETT inserted through vocal cords under direct vision,  positive ETCO2 and breath sounds checked- equal and bilateral Secured at: 22 cm Tube secured with: Tape Dental Injury: Teeth and Oropharynx as per pre-operative assessment

## 2018-09-19 NOTE — Anesthesia Preprocedure Evaluation (Addendum)
Anesthesia Evaluation  Patient identified by MRN, date of birth, ID band Patient awake    Reviewed: Allergy & Precautions, NPO status   Airway Mallampati: II  TM Distance: >3 FB     Dental   Pulmonary former smoker,    breath sounds clear to auscultation       Cardiovascular hypertension, + CAD and + Past MI   Rhythm:Regular Rate:Normal     Neuro/Psych    GI/Hepatic GERD  ,  Endo/Other  diabetes  Renal/GU      Musculoskeletal   Abdominal   Peds  Hematology   Anesthesia Other Findings   Reproductive/Obstetrics                            Anesthesia Physical Anesthesia Plan  ASA: III  Anesthesia Plan: General   Post-op Pain Management:    Induction: Intravenous  PONV Risk Score and Plan: Treatment may vary due to age or medical condition  Airway Management Planned: Oral ETT  Additional Equipment:   Intra-op Plan:   Post-operative Plan: Possible Post-op intubation/ventilation  Informed Consent: I have reviewed the patients History and Physical, chart, labs and discussed the procedure including the risks, benefits and alternatives for the proposed anesthesia with the patient or authorized representative who has indicated his/her understanding and acceptance.   Dental advisory given  Plan Discussed with: CRNA and Anesthesiologist  Anesthesia Plan Comments:         Anesthesia Quick Evaluation

## 2018-09-20 ENCOUNTER — Encounter (HOSPITAL_COMMUNITY): Payer: Self-pay | Admitting: Surgery

## 2018-09-20 LAB — BASIC METABOLIC PANEL
Anion gap: 7 (ref 5–15)
BUN: 7 mg/dL — ABNORMAL LOW (ref 8–23)
CALCIUM: 8.1 mg/dL — AB (ref 8.9–10.3)
CO2: 28 mmol/L (ref 22–32)
CREATININE: 1.24 mg/dL (ref 0.61–1.24)
Chloride: 100 mmol/L (ref 98–111)
GFR calc non Af Amer: 57 mL/min — ABNORMAL LOW (ref >60)
Glucose, Bld: 186 mg/dL — ABNORMAL HIGH (ref 70–99)
Potassium: 4.3 mmol/L (ref 3.5–5.1)
SODIUM: 135 mmol/L (ref 135–145)

## 2018-09-20 LAB — CBC
HCT: 32.2 % — ABNORMAL LOW (ref 39.0–52.0)
Hemoglobin: 10.3 g/dL — ABNORMAL LOW (ref 13.0–17.0)
MCH: 31.7 pg (ref 26.0–34.0)
MCHC: 32 g/dL (ref 30.0–36.0)
MCV: 99.1 fL (ref 80.0–100.0)
NRBC: 0 % (ref 0.0–0.2)
PLATELETS: 115 10*3/uL — AB (ref 150–400)
RBC: 3.25 MIL/uL — ABNORMAL LOW (ref 4.22–5.81)
RDW: 12.5 % (ref 11.5–15.5)
WBC: 11.5 10*3/uL — ABNORMAL HIGH (ref 4.0–10.5)

## 2018-09-20 LAB — GLUCOSE, CAPILLARY
GLUCOSE-CAPILLARY: 156 mg/dL — AB (ref 70–99)
GLUCOSE-CAPILLARY: 180 mg/dL — AB (ref 70–99)
Glucose-Capillary: 226 mg/dL — ABNORMAL HIGH (ref 70–99)
Glucose-Capillary: 226 mg/dL — ABNORMAL HIGH (ref 70–99)

## 2018-09-20 LAB — MAGNESIUM: Magnesium: 1.4 mg/dL — ABNORMAL LOW (ref 1.7–2.4)

## 2018-09-20 MED ORDER — MAGNESIUM SULFATE 4 GM/100ML IV SOLN
4.0000 g | Freq: Once | INTRAVENOUS | Status: AC
  Administered 2018-09-20: 4 g via INTRAVENOUS
  Filled 2018-09-20: qty 100

## 2018-09-20 MED ORDER — PSYLLIUM 95 % PO PACK
1.0000 | PACK | Freq: Two times a day (BID) | ORAL | Status: DC
  Administered 2018-09-20 – 2018-09-23 (×4): 1 via ORAL
  Filled 2018-09-20 (×6): qty 1

## 2018-09-20 MED ORDER — METOPROLOL TARTRATE 5 MG/5ML IV SOLN: 5.0000 mg | Freq: Four times a day (QID) | INTRAVENOUS | Status: DC | PRN

## 2018-09-20 NOTE — Progress Notes (Signed)
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  ADAT -follow off IVF -hypoMAg - replace -f/u pathology -HTN control -DM control - SSI  -VTE prophylaxis- SCDs, etc -mobilize as tolerated to help recovery  D/C patient from hospital when patient meets criteria (anticipate in 1-3 day(s)):  Tolerating oral intake well Ambulating well Adequate pain control without IV medications Urinating  Having flatus Disposition planning in place   20 minutes spent in review, evaluation, examination, counseling, and coordination of care.  More than 50% of that time was spent in counseling.  09/20/2018    Subjective: (Chief complaint)  Retched last night.  Not nauseated now.  Walked in hallways.  Nearly made a lot  before he had to sit down in a wheelchair.  Few bloody bowel movements.  Wife in room.  Nursing coming into room.  Feels some gassiness but no severe pain.  Just mild discomfort at his Pfannenstiel incision when he coughs.  Objective:  Vital signs:  Vitals:   09/19/18 2053 09/20/18 0257 09/20/18 0257 09/20/18 0542  BP: 121/71 128/67 128/67 107/65  Pulse: 88 61 61 64  Resp: 20 16  20   Temp: 98.5 F (36.9 C) 97.6 F (36.4 C) 97.6 F (36.4 C) 98 F (36.7 C)  TempSrc: Oral Oral Oral Oral  SpO2: 98% 96% (!) 72% 100%  Weight:      Height:        Last BM Date: 09/19/18  Intake/Output   Yesterday:  11/14 0701 - 11/15 0700 In: 2678 [P.O.:120; I.V.:2458] Out: 7673 [Urine:1395; Stool:3; Blood:50] This shift:  Total I/O In: 440.3 [P.O.:120; I.V.:220.3; Other:100] Out: 653 [Urine:650; Stool:3]  Bowel function:  Flatus: YES  BM:  YES  Drain: (No drain)   Physical Exam:  General: Pt awake/alert/oriented x4 in no acute distress Eyes: PERRL, normal EOM.  Sclera clear.  No icterus Neuro: CN II-XII intact w/o focal sensory/motor deficits. Lymph: No head/neck/groin lymphadenopathy Psych:  No delerium/psychosis/paranoia HENT: Normocephalic, Mucus membranes moist.  No thrush Neck: Supple, No tracheal deviation Chest: No chest wall pain w good excursion CV:  Pulses intact.  Regular rhythm MS: Normal AROM mjr joints.  No obvious deformity  Abdomen: Obese but Soft.  Mildy distended.  Mildly tender at incisions only.  No evidence of peritonitis.  No incarcerated hernias.  Ext:  No deformity.  No mjr edema.  No cyanosis Skin: No petechiae / purpura  Results:   Labs:  Results for orders placed or performed during the hospital encounter of 09/19/18 (from the past 48 hour(s))  Glucose, capillary     Status: Abnormal   Collection Time: 09/19/18  5:59 AM  Result Value Ref Range   Glucose-Capillary 260 (H) 70 - 99 mg/dL   Comment 1 Notify RN    Comment 2 Document in Chart     Glucose, capillary     Status: Abnormal   Collection Time: 09/19/18 12:09 PM  Result Value Ref Range   Glucose-Capillary 210 (H) 70 - 99 mg/dL  Glucose, capillary     Status: Abnormal   Collection Time: 09/19/18  4:47 PM  Result Value Ref Range   Glucose-Capillary 186 (H) 70 - 99 mg/dL  Glucose, capillary     Status: Abnormal   Collection Time: 09/19/18  8:56 PM  Result Value Ref Range   Glucose-Capillary 201 (H) 70 - 99 mg/dL   Comment 1 Notify RN    Comment 2 Document in Chart   Basic metabolic panel     Status: Abnormal   Collection Time: 09/20/18  4:27 AM  Result Value Ref Range   Sodium 135 135 - 145 mmol/L   Potassium 4.3 3.5 - 5.1 mmol/L   Chloride 100 98 - 111 mmol/L   CO2 28 22 - 32 mmol/L   Glucose, Bld 186 (H) 70 - 99 mg/dL   BUN 7 (L) 8 - 23 mg/dL   Creatinine, Ser 1.24 0.61 - 1.24 mg/dL   Calcium 8.1 (L) 8.9 - 10.3 mg/dL   GFR calc non Af Amer 57 (L) >60 mL/min   GFR calc Af Amer >60 >60 mL/min    Comment: (NOTE) The eGFR has been calculated using the CKD EPI equation. This calculation has not been validated in all clinical situations. eGFR's persistently <60 mL/min signify possible Chronic Kidney Disease.    Anion gap 7 5 - 15    Comment: Performed at [facility], [place] [address], [place], [place] [address]  CBC     Status: Abnormal   Collection Time: 09/20/18  4:27 AM  Result Value Ref Range   WBC 11.5 (H) 4.0 - 10.5 K/uL   RBC 3.25 (L) 4.22 - 5.81 MIL/uL   Hemoglobin 10.3 (L) 13.0 - 17.0 g/dL   HCT 32.2 (L) 39.0 - 52.0 %   MCV 99.1 80.0 - 100.0 fL   MCH 31.7 26.0 - 34.0 pg   MCHC 32.0 30.0 - 36.0 g/dL   RDW 12.5 11.5 - 15.5 %   Platelets 115 (L) 150 - 400 K/uL    Comment: REPEATED TO VERIFY PLATELET COUNT CONFIRMED BY SMEAR SPECIMEN CHECKED FOR CLOTS Immature Platelet Fraction may be clinically indicated, consider ordering this additional test [id number]    nRBC 0.0 0.0 - 0.2 %    Comment: Performed at [facility], [place] [address], [place], [place] [address]  Magnesium     Status: Abnormal   Collection Time: 09/20/18  4:27 AM  Result Value Ref Range   Magnesium 1.4 (L) 1.7 - 2.4 mg/dL    Comment: Performed at [facility], [place] [address] [place], [place] [address]    Imaging / Studies: No results found.  Medications / Allergies: per chart  Antibiotics: Anti-infectives (From admission, onward)   Start     Dose/Rate Route Frequency Ordered Stop   09/19/18 2000  cefoTEtan (CEFOTAN) 2 g in sodium chloride 0.9 % 100 mL IVPB     2  g 200 mL/hr over 30 Minutes Intravenous Every 12 hours 09/19/18 1241 09/19/18 2057   09/19/18 1030  clindamycin (CLEOCIN) 900 mg, gentamicin (GARAMYCIN) 240 mg in sodium chloride 0.9 % 1,000 mL for intraperitoneal lavage  Status:  Discontinued       As needed 09/19/18 1207 09/19/18 1207   09/19/18 0600  cefoTEtan (CEFOTAN) 2 g in sodium chloride 0.9 % 100 mL IVPB     2 g 200 mL/hr over 30 Minutes Intravenous On call to O.R. 09/19/18 0548 09/19/18 0748   09/19/18 0600  clindamycin (CLEOCIN) 900 mg, gentamicin (GARAMYCIN) 240 mg in sodium chloride 0.9 % 1,000 mL for intraperitoneal lavage  Status:  Discontinued      Intraperitoneal To Surgery 09/19/18 0548 09/19/18 1229        Note: Portions of this report may have been transcribed using voice recognition software. Every effort was made to ensure accuracy; however, inadvertent computerized transcription errors may be present.   Any transcriptional errors that result from this process are unintentional.     [name], MD, FACS, MASCRS Gastrointestinal and Minimally Invasive Surgery    [address] [place], [place] [address] [phone] Main / Paging [phone] Fax

## 2018-09-20 NOTE — Progress Notes (Signed)
ERAS education reinforced. Did patient attend class prior to procedure? Yes [  x ] No [   ] Discussed: Pain Control [ x  ] Mobility [ x  ] Diet [x   ] Other [   ]  Patient in chair upon entering room.  No c/o today. No further nausea/vomiting. Wife at bedside reports how helpful the ERAS education class was for them.   Will follow. Pecolia Ades, RN, BSN Quality Program Coordinator, Enhanced Recovery after Surgery 09/20/18 12:06 PM

## 2018-09-20 NOTE — Progress Notes (Signed)
Patient is alert and oriented times 4.   Another attempt made to walk in hallway.  Patient walked from room 34 to nurses station and became weak.  His legs were shaky and wobbly.  So he walked about 100 feet and nurse and tech had to use wheelchair to assist him back to bed.  Vitals taken at this time are recorded.  BP 128/71 and pulse is 72.

## 2018-09-21 LAB — GLUCOSE, CAPILLARY
Glucose-Capillary: 218 mg/dL — ABNORMAL HIGH (ref 70–99)
Glucose-Capillary: 194 mg/dL — ABNORMAL HIGH (ref 70–99)
Glucose-Capillary: 205 mg/dL — ABNORMAL HIGH (ref 70–99)
Glucose-Capillary: 149 mg/dL — ABNORMAL HIGH (ref 70–99)

## 2018-09-21 NOTE — Progress Notes (Signed)
Pharmacy Brief Note - Alvimopan (Entereg)  The standing order set for alvimopan (Entereg) now includes an automatic order to discontinue the drug after the patient has had a bowel movement. The change was approved by the [place] and the Medical Executive Committee.   This patient has had bowel movements documented by nursing. Therefore, alvimopan has been discontinued. If there are questions, please contact the pharmacy at [phone].   Thank you-  Ulice Dash, PharmD Clinical Pharmacist Pager # [phone]

## 2018-09-21 NOTE — Progress Notes (Signed)
2 Days Post-Op   Subjective/Chief Complaint: No complaints   Objective: Vital signs in last 24 hours: Temp:  [98.2 F (36.8 C)-99.1 F (37.3 C)] 99.1 F (37.3 C) (11/16 0648) Pulse Rate:  [71-89] 71 (11/16 0648) Resp:  [14-17] 16 (11/16 0648) BP: (139-152)/(60-79) 142/74 (11/16 0648) SpO2:  [96 %-100 %] 96 % (11/16 0648) Last BM Date: 09/21/18  Intake/Output from previous day: 11/15 0701 - 11/16 0700 In: 1440 [P.O.:1440] Out: 2800 [Urine:2800] Intake/Output this shift: No intake/output data recorded.  General appearance: alert and cooperative Resp: clear to auscultation bilaterally Cardio: regular rate and rhythm GI: soft, mild tenderness  Lab Results:  Recent Labs    09/20/18 0427  WBC 11.5*  HGB 10.3*  HCT 32.2*  PLT 115*   BMET Recent Labs    09/20/18 0427  NA 135  K 4.3  CL 100  CO2 28  GLUCOSE 186*  BUN 7*  CREATININE 1.24  CALCIUM 8.1*   PT/INR No results for input(s): LABPROT, INR in the last 72 hours. ABG No results for input(s): PHART, HCO3 in the last 72 hours.  Invalid input(s): PCO2, PO2  Studies/Results: No results found.  Anti-infectives: Anti-infectives (From admission, onward)   Start     Dose/Rate Route Frequency Ordered Stop   09/19/18 2000  cefoTEtan (CEFOTAN) 2 g in sodium chloride 0.9 % 100 mL IVPB     2 g 200 mL/hr over 30 Minutes Intravenous Every 12 hours 09/19/18 1241 09/19/18 2057   09/19/18 1030  clindamycin (CLEOCIN) 900 mg, gentamicin (GARAMYCIN) 240 mg in sodium chloride 0.9 % 1,000 mL for intraperitoneal lavage  Status:  Discontinued       As needed 09/19/18 1207 09/19/18 1207   09/19/18 0600  cefoTEtan (CEFOTAN) 2 g in sodium chloride 0.9 % 100 mL IVPB     2 g 200 mL/hr over 30 Minutes Intravenous On call to O.R. 09/19/18 0548 09/19/18 0748   09/19/18 0600  clindamycin (CLEOCIN) 900 mg, gentamicin (GARAMYCIN) 240 mg in sodium chloride 0.9 % 1,000 mL for intraperitoneal lavage  Status:  Discontinued     Intraperitoneal To Surgery 09/19/18 0548 09/19/18 1229      Assessment/Plan: s/p Procedure(s): XI ROBOT ASSISTED LEFT HEMICOLECTOMY, MOBILIZATION OF SPLENIC FLEXURE WITH FIREFLY (N/A) RIGID PROCTOSCOPY (N/A) Advance diet as tolerated Pod 2 ambulate  LOS: 2 days    [name] [name] 09/21/2018

## 2018-09-22 LAB — CBC
HCT: 30.4 % — ABNORMAL LOW (ref 39.0–52.0)
Hemoglobin: 9.7 g/dL — ABNORMAL LOW (ref 13.0–17.0)
MCH: 31.7 pg (ref 26.0–34.0)
MCHC: 31.9 g/dL (ref 30.0–36.0)
MCV: 99.3 fL (ref 80.0–100.0)
PLATELETS: 137 10*3/uL — AB (ref 150–400)
RBC: 3.06 MIL/uL — ABNORMAL LOW (ref 4.22–5.81)
RDW: 12.6 % (ref 11.5–15.5)
WBC: 13.4 10*3/uL — ABNORMAL HIGH (ref 4.0–10.5)
nRBC: 0 % (ref 0.0–0.2)

## 2018-09-22 LAB — GLUCOSE, CAPILLARY
GLUCOSE-CAPILLARY: 198 mg/dL — AB (ref 70–99)
Glucose-Capillary: 169 mg/dL — ABNORMAL HIGH (ref 70–99)
Glucose-Capillary: 196 mg/dL — ABNORMAL HIGH (ref 70–99)
Glucose-Capillary: 160 mg/dL — ABNORMAL HIGH (ref 70–99)

## 2018-09-22 NOTE — Progress Notes (Signed)
Small BM with small amount of blood and mucous. Pt ambulated around unit 3 1/2 times.

## 2018-09-22 NOTE — Progress Notes (Signed)
Ice applied to large lower Abdominal incision. Pt sitting up in chair, wife visiting

## 2018-09-22 NOTE — Progress Notes (Signed)
3 Days Post-Op   Subjective/Chief Complaint: Complains of still passing blood in stool. Not lightheaded   Objective: Vital signs in last 24 hours: Temp:  [97.9 F (36.6 C)-98.7 F (37.1 C)] 98 F (36.7 C) (11/17 0533) Pulse Rate:  [64-85] 85 (11/17 0533) Resp:  [15-16] 15 (11/17 0533) BP: (122-157)/(64-81) 157/81 (11/17 0533) SpO2:  [96 %-100 %] 97 % (11/17 0533) Weight:  [92.7 kg] 92.7 kg (11/17 0500) Last BM Date: 09/21/18  Intake/Output from previous day: 11/16 0701 - 11/17 0700 In: 60 [P.O.:60] Out: 1525 [Urine:1525] Intake/Output this shift: No intake/output data recorded.  General appearance: alert and cooperative Resp: clear to auscultation bilaterally Cardio: regular rate and rhythm GI: soft, mild tenderness. incisions look good  Lab Results:  Recent Labs    09/20/18 0427  WBC 11.5*  HGB 10.3*  HCT 32.2*  PLT 115*   BMET Recent Labs    09/20/18 0427  NA 135  K 4.3  CL 100  CO2 28  GLUCOSE 186*  BUN 7*  CREATININE 1.24  CALCIUM 8.1*   PT/INR No results for input(s): LABPROT, INR in the last 72 hours. ABG No results for input(s): PHART, HCO3 in the last 72 hours.  Invalid input(s): PCO2, PO2  Studies/Results: No results found.  Anti-infectives: Anti-infectives (From admission, onward)   Start     Dose/Rate Route Frequency Ordered Stop   09/19/18 2000  cefoTEtan (CEFOTAN) 2 g in sodium chloride 0.9 % 100 mL IVPB     2 g 200 mL/hr over 30 Minutes Intravenous Every 12 hours 09/19/18 1241 09/19/18 2057   09/19/18 1030  clindamycin (CLEOCIN) 900 mg, gentamicin (GARAMYCIN) 240 mg in sodium chloride 0.9 % 1,000 mL for intraperitoneal lavage  Status:  Discontinued       As needed 09/19/18 1207 09/19/18 1207   09/19/18 0600  cefoTEtan (CEFOTAN) 2 g in sodium chloride 0.9 % 100 mL IVPB     2 g 200 mL/hr over 30 Minutes Intravenous On call to O.R. 09/19/18 0548 09/19/18 0748   09/19/18 0600  clindamycin (CLEOCIN) 900 mg, gentamicin (GARAMYCIN) 240  mg in sodium chloride 0.9 % 1,000 mL for intraperitoneal lavage  Status:  Discontinued      Intraperitoneal To Surgery 09/19/18 0548 09/19/18 1229      Assessment/Plan: s/p Procedure(s): XI ROBOT ASSISTED LEFT HEMICOLECTOMY, MOBILIZATION OF SPLENIC FLEXURE WITH FIREFLY (N/A) RIGID PROCTOSCOPY (N/A) Advance diet  Ambulate Recheck cbc today  LOS: 3 days    [name] [name] 09/22/2018

## 2018-09-23 DIAGNOSIS — K589 Irritable bowel syndrome without diarrhea: Secondary | ICD-10-CM

## 2018-09-23 LAB — GLUCOSE, CAPILLARY: Glucose-Capillary: 160 mg/dL — ABNORMAL HIGH (ref 70–99)

## 2018-09-23 MED ORDER — TRAMADOL HCL 50 MG PO TABS: 50.0000 mg | ORAL_TABLET | Freq: Four times a day (QID) | ORAL | 0 refills | Status: DC | PRN
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
## 2018-09-23 NOTE — Discharge Instructions (Signed)
SURGERY: POST OP INSTRUCTIONS (Surgery for small bowel obstruction, colon resection, etc)   ######################################################################  EAT Gradually transition to a high fiber diet with a fiber supplement over the next few days after discharge  WALK Walk an hour a day.  Control your pain to do that.    CONTROL PAIN Control pain so that you can walk, sleep, tolerate sneezing/coughing, go up/down stairs.  HAVE A BOWEL MOVEMENT DAILY Keep your bowels regular to avoid problems.  Take fiber supplement such as flax seed to stay regular.  OK to try a laxative to override constipation.  OK to use an antidairrheal to slow down diarrhea.  Call if not better after 2 tries  CALL IF YOU HAVE PROBLEMS/CONCERNS Call if you are still struggling despite following these instructions. Call if you have concerns not answered by these instructions  ######################################################################   DIET Follow a light diet the first few days at home.  Start with a bland diet such as soups, liquids, starchy foods, low fat foods, etc.  If you feel full, bloated, or constipated, stay on a ful liquid or pureed/blenderized diet for a few days until you feel better and no longer constipated. Be sure to drink plenty of fluids every day to avoid getting dehydrated (feeling dizzy, not urinating, etc.). Gradually add a fiber supplement to your diet over the next week.  Gradually get back to a regular solid diet.  Avoid fast food or heavy meals the first week as you are more likely to get nauseated. It is expected for your digestive tract to need a few months to get back to normal.  It is common for your bowel movements and stools to be irregular.  You will have occasional bloating and cramping that should eventually fade away.  Until you are eating solid food normally, off all pain medications, and back to regular activities; your bowels will not be normal. Focus on  eating a low-fat, high fiber diet the rest of your life (See Getting to [place], below).  CARE of your INCISION or WOUND It is good for closed incision and even open wounds to be washed every day.  Shower every day.  Short baths are fine.  Wash the incisions and wounds clean with soap & water.    If you have a closed incision(s), wash the incision with soap & water every day.  You may leave closed incisions open to air if it is dry.   You may cover the incision with clean gauze & replace it after your daily shower for comfort. If you have skin tapes (Steristrips) or skin glue (Dermabond) on your incision, leave them in place.  They will fall off on their own like a scab.  You may trim any edges that curl up with clean scissors.  If you have staples, set up an appointment for them to be removed in the office in 10 days after surgery.  If you have a drain, wash around the skin exit site with soap & water and place a new dressing of gauze or band aid around the skin every day.  Keep the drain site clean & dry.    If you have an open wound with packing, see wound care instructions.  In general, it is encouraged that you remove your dressing and packing, shower with soap & water, and replace your dressing once a day.  Pack the wound with clean gauze moistened with normal (0.9%) saline to keep the wound moist & uninfected.  Pressure on the dressing for 30 minutes will stop most wound bleeding.  Eventually your body will heal & pull the open wound closed over the next few months.  Raw open wounds will occasionally bleed or secrete yellow drainage until it heals closed.  Drain sites will drain a little until the drain is removed.  Even closed incisions can have mild bleeding or drainage the first few days until the skin edges scab over & seal.   If you have an open wound with a wound vac, see wound vac care instructions.     ACTIVITIES as tolerated Start light daily activities --- self-care,  walking, climbing stairs-- beginning the day after surgery.  Gradually increase activities as tolerated.  Control your pain to be active.  Stop when you are tired.  Ideally, walk several times a day, eventually an hour a day.   Most people are back to most day-to-day activities in a few weeks.  It takes 4-8 weeks to get back to unrestricted, intense activity. If you can walk 30 minutes without difficulty, it is safe to try more intense activity such as jogging, treadmill, bicycling, low-impact aerobics, swimming, etc. Save the most intensive and strenuous activity for last (Usually 4-8 weeks after surgery) such as sit-ups, heavy lifting, contact sports, etc.  Refrain from any intense heavy lifting or straining until you are off narcotics for pain control.  You will have off days, but things should improve week-by-week. DO NOT PUSH THROUGH PAIN.  Let pain be your guide: If it hurts to do something, don't do it.  Pain is your body warning you to avoid that activity for another week until the pain goes down. You may drive when you are no longer taking narcotic prescription pain medication, you can comfortably wear a seatbelt, and you can safely make sudden turns/stops to protect yourself without hesitating due to pain. You may have sexual intercourse when it is comfortable. If it hurts to do something, stop.  MEDICATIONS Take your usually prescribed home medications unless otherwise directed.   Blood thinners:  Usually you can restart any strong blood thinners after the second postoperative day.  It is OK to take aspirin right away.     If you are on strong blood thinners (warfarin/Coumadin, Plavix, Xerelto, Eliquis, Pradaxa, etc), discuss with your surgeon, medicine PCP, and/or cardiologist for instructions on when to restart the blood thinner & if blood monitoring is needed (PT/INR blood check, etc).     PAIN CONTROL Pain after surgery or related to activity is often due to strain/injury to muscle,  tendon, nerves and/or incisions.  This pain is usually short-term and will improve in a few months.  To help speed the process of healing and to get back to regular activity more quickly, DO THE FOLLOWING THINGS TOGETHER: 1. Increase activity gradually.  DO NOT PUSH THROUGH PAIN 2. Use Ice and/or Heat 3. Try Gentle Massage and/or Stretching 4. Take over the counter pain medication 5. Take Narcotic prescription pain medication for more severe pain  Good pain control = faster recovery.  It is better to take more medicine to be more active than to stay in bed all day to avoid medications. 1.  Increase activity gradually Avoid heavy lifting at first, then increase to lifting as tolerated over the next 6 weeks. Do not push through the pain.  Listen to your body and avoid positions and maneuvers than reproduce the pain.  Wait a few days before trying something more intense Walking an  hour a day is encouraged to help your body recover faster and more safely.  Start slowly and stop when getting sore.  If you can walk 30 minutes without stopping or pain, you can try more intense activity (running, jogging, aerobics, cycling, swimming, treadmill, sex, sports, weightlifting, etc.) Remember: If it hurts to do it, then dont do it! 2. Use Ice and/or Heat You will have swelling and bruising around the incisions.  This will take several weeks to resolve. Ice packs or heating pads (6-8 times a day, 30-60 minutes at a time) will help sooth soreness & bruising. Some people prefer to use ice alone, heat alone, or alternate between ice & heat.  Experiment and see what works best for you.  Consider trying ice for the first few days to help decrease swelling and bruising; then, switch to heat to help relax sore spots and speed recovery. Shower every day.  Short baths are fine.  It feels good!  Keep the incisions and wounds clean with soap & water.   3. Try Gentle Massage and/or Stretching Massage at the area of pain  many times a day Stop if you feel pain - do not overdo it 4. Take over the counter pain medication This helps the muscle and nerve tissues become less irritable and calm down faster Choose ONE of the following over-the-counter anti-inflammatory medications: Acetaminophen 500mg  tabs (Tylenol) 1-2 pills with every meal and just before bedtime (avoid if you have liver problems or if you have acetaminophen in you narcotic prescription) Naproxen 220mg  tabs (ex. Aleve, Naprosyn) 1-2 pills twice a day (avoid if you have kidney, stomach, IBD, or bleeding problems) Ibuprofen 200mg  tabs (ex. Advil, Motrin) 3-4 pills with every meal and just before bedtime (avoid if you have kidney, stomach, IBD, or bleeding problems) Take with food/snack several times a day as directed for at least 2 weeks to help keep pain / soreness down & more manageable. 5. Take Narcotic prescription pain medication for more severe pain A prescription for strong pain control is often given to you upon discharge (for example: oxycodone/Percocet, hydrocodone/Norco/Vicodin, or tramadol/Ultram) Take your pain medication as prescribed. Be mindful that most narcotic prescriptions contain Tylenol (acetaminophen) as well - avoid taking too much Tylenol. If you are having problems/concerns with the prescription medicine (does not control pain, nausea, vomiting, rash, itching, etc.), please call us [phone] to see if we need to switch you to a different pain medicine that will work better for you and/or control your side effects better. If you need a refill on your pain medication, you must call the office before 4 pm and on weekdays only.  By federal law, prescriptions for narcotics cannot be called into a pharmacy.  They must be filled out on paper & picked up from our office by the patient or authorized caretaker.  Prescriptions cannot be filled after 4 pm nor on weekends.    WHEN TO CALL us [phone] Severe uncontrolled or worsening  pain  Fever over 101 F (38.5 C) Concerns with the incision: Worsening pain, redness, rash/hives, swelling, bleeding, or drainage Reactions / problems with new medications (itching, rash, hives, nausea, etc.) Nausea and/or vomiting Difficulty urinating Difficulty breathing Worsening fatigue, dizziness, lightheadedness, blurred vision Other concerns If you are not getting better after two weeks or are noticing you are getting worse, contact our office [phone] for further advice.  We may need to adjust your medications, re-evaluate you in the office, send you to the emergency room,  or see what other things we can do to help. The clinic staff is available to answer your questions during regular business hours (8:30am-5pm).  Please dont hesitate to call and ask to speak to one of our nurses for clinical concerns.    A surgeon from [facility] is always on call at the hospitals 24 hours/day If you have a medical emergency, go to the nearest emergency room or call 911.  FOLLOW UP in our office One the day of your discharge from the hospital (or the next business weekday), please call [facility] to set up or confirm an appointment to see your surgeon in the office for a follow-up appointment.  Usually it is 2-3 weeks after your surgery.   If you have skin staples at your incision(s), let the office know so we can set up a time in the office for the nurse to remove them (usually around 10 days after surgery). Make sure that you call for appointments the day of discharge (or the next business weekday) from the hospital to ensure a convenient appointment time. IF YOU HAVE DISABILITY OR FAMILY LEAVE FORMS, BRING THEM TO THE OFFICE FOR PROCESSING.  DO NOT GIVE THEM TO YOUR DOCTOR.  [facility] [address], [place], [place], [place]  [address] ? [phone] - Main [phone] - [place],  [phone] -  Fax [email or web address]  GETTING TO GOOD BOWEL HEALTH. It is expected for your digestive tract to need a few months to get back to normal.  It is common for your bowel movements and stools to be irregular.  You will have occasional bloating and cramping that should eventually fade away.  Until you are eating solid food normally, off all pain medications, and back to regular activities; your bowels will not be normal.   Avoiding constipation The goal: ONE SOFT BOWEL MOVEMENT A DAY!    Drink plenty of fluids.  Choose water first. TAKE A FIBER SUPPLEMENT EVERY DAY THE REST OF YOUR LIFE During your first week back home, gradually add back a fiber supplement every day Experiment which form you can tolerate.   There are many forms such as powders, tablets, wafers, gummies, etc Psyllium bran (Metamucil), methylcellulose (Citrucel), Miralax or Glycolax, Benefiber, Flax Seed.  Adjust the dose week-by-week (1/2 dose/day to 6 doses a day) until you are moving your bowels 1-2 times a day.  Cut back the dose or try a different fiber product if it is giving you problems such as diarrhea or bloating. Sometimes a laxative is needed to help jump-start bowels if constipated until the fiber supplement can help regulate your bowels.  If you are tolerating eating & you are farting, it is okay to try a gentle laxative such as double dose MiraLax, prune juice, or Milk of Magnesia.  Avoid using laxatives too often. Stool softeners can sometimes help counteract the constipating effects of narcotic pain medicines.  It can also cause diarrhea, so avoid using for too long. If you are still constipated despite taking fiber daily, eating solids, and a few doses of laxatives, call our office. Controlling diarrhea Try drinking liquids and eating bland foods for a few days to avoid stressing your intestines further. Avoid dairy products (especially milk & ice cream) for a short time.  The intestines often can lose the  ability to digest lactose when stressed. Avoid foods that cause gassiness or bloating.  Typical foods include beans and other legumes, cabbage, broccoli, and dairy  foods.  Avoid greasy, spicy, fast foods.  Every person has some sensitivity to other foods, so listen to your body and avoid those foods that trigger problems for you. Probiotics (such as active yogurt, Align, etc) may help repopulate the intestines and colon with normal bacteria and calm down a sensitive digestive tract Adding a fiber supplement gradually can help thicken stools by absorbing excess fluid and retrain the intestines to act more normally.  Slowly increase the dose over a few weeks.  Too much fiber too soon can backfire and cause cramping & bloating. It is okay to try and slow down diarrhea with a few doses of antidiarrheal medicines.   Bismuth subsalicylate (ex. Kayopectate, Pepto Bismol) for a few doses can help control diarrhea.  Avoid if pregnant.   Loperamide (Imodium) can slow down diarrhea.  Start with one tablet (2mg ) first.  Avoid if you are having fevers or severe pain.  ILEOSTOMY PATIENTS WILL HAVE CHRONIC DIARRHEA since their colon is not in use.    Drink plenty of liquids.  You will need to drink even more glasses of water/liquid a day to avoid getting dehydrated. Record output from your ileostomy.  Expect to empty the bag every 3-4 hours at first.  Most people with a permanent ileostomy empty their bag 4-6 times at the least.   Use antidiarrheal medicine (especially Imodium) several times a day to avoid getting dehydrated.  Start with a dose at bedtime & breakfast.  Adjust up or down as needed.  Increase antidiarrheal medications as directed to avoid emptying the bag more than 8 times a day (every 3 hours). Work with your wound ostomy nurse to learn care for your ostomy.  See ostomy care instructions. TROUBLESHOOTING IRREGULAR BOWELS 1) Start with a soft & bland diet. No spicy, greasy, or fried foods.  2) Avoid  gluten/wheat or dairy products from diet to see if symptoms improve. 3) Miralax 17gm or flax seed mixed in [place]. water or juice-daily. May use 2-4 times a day as needed. 4) Gas-X, Phazyme, etc. as needed for gas & bloating.  5) Prilosec (omeprazole) over-the-counter as needed 6)  Consider probiotics (Align, Activa, etc) to help calm the bowels down  Call your doctor if you are getting worse or not getting better.  Sometimes further testing (cultures, endoscopy, X-ray studies, CT scans, bloodwork, etc.) may be needed to help diagnose and treat the cause of the diarrhea. [facility], [place], [place], [place], [place]  [address] [phone] - Main.    [phone]  - Toll Free.   [phone] - Fax [email or web address]   Colorectal Cancer Colorectal cancer is an abnormal growth of cells and tissue (tumor) in the colon or rectum, which are parts of the large intestine. The cancer can spread (metastasize) to other parts of the body. What are the causes? Most cases of colorectal cancer start as abnormal growths called polyps on the inner wall of the colon or rectum. Other times, abnormal changes to genes (genetic mutations) can cause cells to form cancer. What increases the risk? You are more likely to develop this condition if:  You are older than age 23.  You have multiple polyps in the colon or rectum.  You have diabetes.  You are African American.  You have a family history of Lynch syndrome.  You have had cancer before.  You have certain hereditary conditions, such as: ? Familial adenomatous polyposis. ? Turcot syndrome. ? Peutz-Jeghers syndrome.  You eat a  diet that is high in fat (especially animal fat) and low in fiber, fruits, and vegetables.  You have an inactive (sedentary) lifestyle.  You have an inflammatory bowel disease or Crohn disease.  You smoke.  You drink alcohol excessively.  What are the signs or  symptoms? Early colorectal cancer often does not cause symptoms. As the cancer grows, symptoms may include:  Changes in bowel habits.  Feeling like the bowel does not empty completely after a bowel movement.  Stools that are narrower than usual.  Blood in the stool.  Diarrhea.  Constipation.  Anemia.  Discomfort, pain, bloating, fullness, or cramps in the abdomen.  Frequent gas pain.  Unexplained weight loss.  Constant fatigue.  Nausea and vomiting.  How is this diagnosed? This condition may be diagnosed with:  A medical history.  A physical exam.  Tests. These may include: ? A digital rectal exam. ? A stool test called a fecal occult blood test. ? Blood tests. ? A test in which a tissue sample is taken from the colon or rectum and examined under a microscope (biopsy). ? Imaging tests, such as:  X-rays.  A barium enema.  CT scans.  MRIs.  A sigmoidoscopy. This test is done to view the inside of the rectum.  A colonoscopy. This test is done to view the inside of the colon. During this test, small polyps can be removed or biopsies may be taken.  An endorectal ultrasound. This test checks how deep a tumor in the rectum has grown and whether the cancer has spread to lymph nodes or other nearby tissues.  Your health care provider may order additional tests to find out whether the cancer has spread to other parts of the body (what stage it is). The stages of cancer include:  Stage 0. At this stage, the cancer is found only in the innermost lining of the colon or rectum.  Stage I. At this stage, the cancer has grown into the inner wall of the colon or rectum.  Stage II. At this stage, the cancer has gone more deeply into the wall of the colon or rectum or through the wall. It may have invaded nearby tissue.  Stage III. At this stage, the cancer has spread to nearby lymph nodes.  Stage IV. At this stage, the cancer has spread to other parts of the body, such  as the liver or lungs.  How is this treated? Treatment for this condition depends on the type and stage of the cancer. Treatment may include:  Surgery. In the early stages of the cancer, surgery may be done to remove polyps or small tumors from the colon. In later stages, surgery may be done to remove part of the colon.  Chemotherapy. This treatment uses medicines to kill cancer cells.  Targeted therapy. This treatment targets specific gene mutations or proteins that the cancer expresses in order to kill tumor cells.  Radiation therapy. This treatment uses radiation to kill cancer cells or shrink tumors.  Radiofrequency ablation. This treatment uses radio waves to destroy the tumors that may have spread to other areas of the body, such as the liver.  Follow these instructions at home:  Take over-the-counter and prescription medicines only as told by your health care provider.  Try to eat regular, healthy meals. Some of your treatments might affect your appetite. If you are having problems eating or with your appetite, ask to meet with a food and nutrition specialist (dietitian).  Consider joining a support group.  This may help you learn about your diagnosis and cope with the stress of having colorectal cancer.  If you are admitted to the hospital, inform your cancer care team.  Keep all follow-up visits as told by your health care provider. This is important. How is this prevented?  Colorectal cancer can be prevented with screening tests that find polyps so they can be removed before they develop into cancer.  Most people with average risk of colorectal cancer should start screening at age 49. People at increased risk should start screening at an earlier age. Where to find more information:  American Cancer Society: https://www.cancer.[place] https://www.cancer.gov Contact a health care provider if:  Your diarrhea or constipation does not go  away.  You have blood in your stool.  Your bowel habits change.  You have increased pain in your abdomen.  You notice new fatigue or weakness.  You lose weight. Get help right away if:  You have increased bleeding from your rectum.  You have any uncontrollable or severe abdomen (abdominal) symptoms. Summary  Colorectal cancer is an abnormal growth of cells and tissue (tumor) in the colon or rectum.  Common risk factors for this condition include having a relative with colon cancer, being older in age, having an inflammatory bowel disease, and being African American.  This condition may be diagnosed with tests, such as a colonoscopy and biopsy.  Treatment depends on the type and stage of the cancer. Commonly, treatment includes surgery to remove the tumor along with chemotherapy or targeted therapy.  Keep all follow-up visits as told by your health care provider. This is important. This information is not intended to replace advice given to you by your health care provider. Make sure you discuss any questions you have with your health care provider. Document Released: 10/23/2005 Document Revised: 11/24/2016 Document Reviewed: 11/24/2016 Elsevier Interactive Patient Education  2018 [place].  ######################################################################  EAT Gradually transition to a high fiber diet with a fiber supplement over the next few weeks after discharge.  Start with a pureed / full liquid diet (see below)  WALK Walk an hour a day.  Control your pain to do that.    HAVE A BOWEL MOVEMENT DAILY Keep your bowels regular to avoid problems.  OK to try a laxative to override constipation.  OK to use an antidairrheal to slow down diarrhea.  Call if not better after 2 tries  CALL IF YOU HAVE PROBLEMS/CONCERNS Call if you are still struggling despite following these instructions. Call if you have concerns not answered by these  instructions  ######################################################################   Irregular bowel habits such as constipation and diarrhea can lead to many problems over time.  Having one soft bowel movement a day is the most important way to prevent further problems.  The anorectal canal is designed to handle stretching and feces to safely manage our ability to get rid of solid waste (feces, poop, stool) out of our body.  BUT, hard constipated stools can act like ripping concrete bricks and diarrhea can be a burning fire to this very sensitive area of our body, causing inflamed hemorrhoids, anal fissures, increasing risk is perirectal abscesses, abdominal pain/bloating, an making irritable bowel worse.      The goal: ONE SOFT BOWEL MOVEMENT A DAY!  To have soft, regular bowel movements:   Drink plenty of fluids, consider 4-6 tall glasses of water a day.    Take plenty of fiber.  Fiber is the undigested part of plant food that passes into the colon, acting s natures broom to encourage bowel motility and movement.  Fiber can absorb and hold large amounts of water. This results in a larger, bulkier stool, which is soft and easier to pass. Work gradually over several weeks up to 6 servings a day of fiber (25g a day even more if needed) in the form of: o Vegetables -- Root (potatoes, carrots, turnips), leafy green (lettuce, salad greens, celery, spinach), or cooked high residue (cabbage, broccoli, etc) o Fruit -- Fresh (unpeeled skin & pulp), Dried (prunes, apricots, cherries, etc ),  or stewed ( applesauce)  o Whole grain breads, pasta, etc (whole wheat)  o Bran cereals   Bulking Agents -- This type of water-retaining fiber generally is easily obtained each day by one of the following:  o Psyllium bran -- The psyllium plant is remarkable because its ground seeds can retain so much water. This product is available as Metamucil, Konsyl, Effersyllium, Per Diem Fiber, or the less expensive generic  preparation in drug and health food stores. Although labeled a laxative, it really is not a laxative.  o Methylcellulose -- This is another fiber derived from wood which also retains water. It is available as Citrucel. o Polyethylene Glycol - and artificial fiber commonly called Miralax or Glycolax.  It is helpful for people with gassy or bloated feelings with regular fiber o Flax Seed - a less gassy fiber than psyllium  No reading or other relaxing activity while on the toilet. If bowel movements take longer than 5 minutes, you are too constipated  AVOID CONSTIPATION.  High fiber and water intake usually takes care of this.  Sometimes a laxative is needed to stimulate more frequent bowel movements, but   Laxatives are not a good long-term solution as it can wear the colon out.  They can help jump-start bowels if constipated, but should be relied on constantly without discussing with your doctor o Osmotics (Milk of Magnesia, Fleets phosphosoda, Magnesium citrate, MiraLax, GoLytely) are safer than  o Stimulants (Senokot, Castor Oil, Dulcolax, Ex Lax)    o Avoid taking laxatives for more than 7 days in a row.   IF SEVERELY CONSTIPATED, try a Bowel Retraining Program: o Do not use laxatives.  o Eat a diet high in roughage, such as bran cereals and leafy vegetables.  o Drink six (6) ounces of prune or apricot juice each morning.  o Eat two (2) large servings of stewed fruit each day.  o Take one (1) heaping tablespoon of a psyllium-based bulking agent twice a day. Use sugar-free sweetener when possible to avoid excessive calories.  o Eat a normal breakfast.  o Set aside 15 minutes after breakfast to sit on the toilet, but do not strain to have a bowel movement.  o If you do not have a bowel movement by the third day, use an enema and repeat the above steps.   Controlling diarrhea o Switch to liquids and simpler foods for a few days to avoid stressing your intestines further. o Avoid dairy  products (especially milk & ice cream) for a short time.  The intestines often can lose the ability to digest lactose when stressed. o Avoid foods that cause gassiness or bloating.  Typical foods include beans and other legumes, cabbage, broccoli, and dairy foods.  Every person has some sensitivity to other foods, so listen to our body and avoid those foods that trigger problems for you. o Adding  fiber (Citrucel, Metamucil, psyllium, Miralax) gradually can help thicken stools by absorbing excess fluid and retrain the intestines to act more normally.  Slowly increase the dose over a few weeks.  Too much fiber too soon can backfire and cause cramping & bloating. o Probiotics (such as active yogurt, Align, etc) may help repopulate the intestines and colon with normal bacteria and calm down a sensitive digestive tract.  Most studies show it to be of mild help, though, and such products can be costly. o Medicines: - Bismuth subsalicylate (ex. Kayopectate, Pepto Bismol) every 30 minutes for up to 6 doses can help control diarrhea.  Avoid if pregnant. - Loperamide (Immodium) can slow down diarrhea.  Start with two tablets (4mg  total) first and then try one tablet every 6 hours.  Avoid if you are having fevers or severe pain.  If you are not better or start feeling worse, stop all medicines and call your doctor for advice o Call your doctor if you are getting worse or not better.  Sometimes further testing (cultures, endoscopy, X-ray studies, bloodwork, etc) may be needed to help diagnose and treat the cause of the diarrhea.  TROUBLESHOOTING IRREGULAR BOWELS 1) Avoid extremes of bowel movements (no bad constipation/diarrhea) 2) Miralax 17gm mixed in 8oz. water or juice-daily. May use BID as needed.  3) Gas-x,Phazyme, etc. as needed for gas & bloating.  4) Soft,bland diet. No spicy,greasy,fried foods.  5) Prilosec over-the-counter as needed  6) May hold gluten/wheat products from diet to see if symptoms  improve.  7)  May try probiotics (Align, Activa, etc) to help calm the bowels down 7) If symptoms become worse call back immediately.

## 2018-09-23 NOTE — Progress Notes (Signed)
Pt was discharged home today. Instructions were reviewed with patient, and questions were answered. Pt was taken to main entrance via wheelchair by NT.  

## 2018-09-27 ENCOUNTER — Encounter (HOSPITAL_COMMUNITY): Payer: Self-pay | Admitting: Emergency Medicine

## 2018-09-27 ENCOUNTER — Inpatient Hospital Stay (HOSPITAL_COMMUNITY)
Admission: EM | Admit: 2018-09-27 | Discharge: 2018-09-29 | DRG: 394 | Disposition: A | Discharge status: Home / Self Care | Payer: Medicare Other | Attending: Surgery | Admitting: Surgery

## 2018-09-27 ENCOUNTER — Emergency Department (HOSPITAL_COMMUNITY): Payer: Medicare Other

## 2018-09-27 ENCOUNTER — Other Ambulatory Visit: Payer: Self-pay

## 2018-09-27 ENCOUNTER — Ambulatory Visit: Payer: Medicare Other | Admitting: Cardiovascular Disease

## 2018-09-27 ENCOUNTER — Encounter: Payer: Self-pay | Admitting: Cardiovascular Disease

## 2018-09-27 VITALS — BP 102/74 | HR 62 | Ht 65.0 in | Wt 200.6 lb

## 2018-09-27 DIAGNOSIS — I2510 Atherosclerotic heart disease of native coronary artery without angina pectoris: Secondary | ICD-10-CM | POA: Diagnosis not present

## 2018-09-27 DIAGNOSIS — E039 Hypothyroidism, unspecified: Secondary | ICD-10-CM | POA: Diagnosis not present

## 2018-09-27 DIAGNOSIS — K567 Ileus, unspecified: Secondary | ICD-10-CM | POA: Diagnosis not present

## 2018-09-27 DIAGNOSIS — K589 Irritable bowel syndrome without diarrhea: Secondary | ICD-10-CM | POA: Diagnosis present

## 2018-09-27 DIAGNOSIS — I10 Essential (primary) hypertension: Secondary | ICD-10-CM

## 2018-09-27 DIAGNOSIS — E7800 Pure hypercholesterolemia, unspecified: Secondary | ICD-10-CM

## 2018-09-27 DIAGNOSIS — R1310 Dysphagia, unspecified: Secondary | ICD-10-CM | POA: Diagnosis not present

## 2018-09-27 DIAGNOSIS — E119 Type 2 diabetes mellitus without complications: Secondary | ICD-10-CM | POA: Diagnosis not present

## 2018-09-27 DIAGNOSIS — K91870 Postprocedural hematoma of a digestive system organ or structure following a digestive system procedure: Secondary | ICD-10-CM | POA: Diagnosis present

## 2018-09-27 DIAGNOSIS — E785 Hyperlipidemia, unspecified: Secondary | ICD-10-CM | POA: Diagnosis present

## 2018-09-27 DIAGNOSIS — Z7984 Long term (current) use of oral hypoglycemic drugs: Secondary | ICD-10-CM | POA: Diagnosis not present

## 2018-09-27 DIAGNOSIS — Z8249 Family history of ischemic heart disease and other diseases of the circulatory system: Secondary | ICD-10-CM

## 2018-09-27 DIAGNOSIS — Z9049 Acquired absence of other specified parts of digestive tract: Secondary | ICD-10-CM

## 2018-09-27 DIAGNOSIS — C186 Malignant neoplasm of descending colon: Secondary | ICD-10-CM | POA: Diagnosis not present

## 2018-09-27 DIAGNOSIS — Z951 Presence of aortocoronary bypass graft: Secondary | ICD-10-CM

## 2018-09-27 DIAGNOSIS — I252 Old myocardial infarction: Secondary | ICD-10-CM | POA: Diagnosis not present

## 2018-09-27 DIAGNOSIS — K219 Gastro-esophageal reflux disease without esophagitis: Secondary | ICD-10-CM | POA: Diagnosis present

## 2018-09-27 DIAGNOSIS — Y838 Other surgical procedures as the cause of abnormal reaction of the patient, or of later complication, without mention of misadventure at the time of the procedure: Secondary | ICD-10-CM | POA: Diagnosis present

## 2018-09-27 DIAGNOSIS — I493 Ventricular premature depolarization: Secondary | ICD-10-CM

## 2018-09-27 DIAGNOSIS — R112 Nausea with vomiting, unspecified: Secondary | ICD-10-CM

## 2018-09-27 DIAGNOSIS — Z7982 Long term (current) use of aspirin: Secondary | ICD-10-CM | POA: Diagnosis not present

## 2018-09-27 DIAGNOSIS — Z79890 Hormone replacement therapy (postmenopausal): Secondary | ICD-10-CM

## 2018-09-27 DIAGNOSIS — K9189 Other postprocedural complications and disorders of digestive system: Secondary | ICD-10-CM | POA: Diagnosis not present

## 2018-09-27 DIAGNOSIS — Z87891 Personal history of nicotine dependence: Secondary | ICD-10-CM

## 2018-09-27 DIAGNOSIS — Z833 Family history of diabetes mellitus: Secondary | ICD-10-CM

## 2018-09-27 LAB — COMPREHENSIVE METABOLIC PANEL
ALK PHOS: 121 U/L (ref 38–126)
ALT: 40 U/L (ref 0–44)
ANION GAP: 8 (ref 5–15)
AST: 38 U/L (ref 15–41)
Albumin: 3 g/dL — ABNORMAL LOW (ref 3.5–5.0)
BUN: 11 mg/dL (ref 8–23)
CALCIUM: 8.7 mg/dL — AB (ref 8.9–10.3)
CO2: 31 mmol/L (ref 22–32)
Chloride: 97 mmol/L — ABNORMAL LOW (ref 98–111)
Creatinine, Ser: 0.92 mg/dL (ref 0.61–1.24)
GFR calc Af Amer: >60 mL/min (ref >60)
GFR calc non Af Amer: >60 mL/min (ref >60)
Glucose, Bld: 167 mg/dL — ABNORMAL HIGH (ref 70–99)
POTASSIUM: 3.9 mmol/L (ref 3.5–5.1)
SODIUM: 136 mmol/L (ref 135–145)
Total Bilirubin: 1.6 mg/dL — ABNORMAL HIGH (ref 0.3–1.2)
Total Protein: 6.7 g/dL (ref 6.5–8.1)

## 2018-09-27 LAB — URINALYSIS, ROUTINE W REFLEX MICROSCOPIC
Bilirubin Urine: NEGATIVE
GLUCOSE, UA: 50 mg/dL — AB
Hgb urine dipstick: NEGATIVE
Ketones, ur: NEGATIVE mg/dL
LEUKOCYTES UA: NEGATIVE
Nitrite: NEGATIVE
PROTEIN: NEGATIVE mg/dL
Specific Gravity, Urine: 1.008 (ref 1.005–1.030)
pH: 6 (ref 5.0–8.0)

## 2018-09-27 LAB — GLUCOSE, CAPILLARY: GLUCOSE-CAPILLARY: 91 mg/dL (ref 70–99)

## 2018-09-27 LAB — CBC
HCT: 31.7 % — ABNORMAL LOW (ref 39.0–52.0)
Hemoglobin: 10.2 g/dL — ABNORMAL LOW (ref 13.0–17.0)
MCH: 32.2 pg (ref 26.0–34.0)
MCHC: 32.2 g/dL (ref 30.0–36.0)
MCV: 100 fL (ref 80.0–100.0)
NRBC: 0.1 % (ref 0.0–0.2)
PLATELETS: 298 10*3/uL (ref 150–400)
RBC: 3.17 MIL/uL — AB (ref 4.22–5.81)
RDW: 14.1 % (ref 11.5–15.5)
WBC: 13.7 10*3/uL — ABNORMAL HIGH (ref 4.0–10.5)

## 2018-09-27 LAB — MAGNESIUM: Magnesium: 1.8 mg/dL (ref 1.7–2.4)

## 2018-09-27 LAB — I-STAT CG4 LACTIC ACID, ED
LACTIC ACID, VENOUS: 1.78 mmol/L (ref 0.5–1.9)
Lactic Acid, Venous: 1.57 mmol/L (ref 0.5–1.9)

## 2018-09-27 LAB — TYPE AND SCREEN
ABO/RH(D): A POS
ANTIBODY SCREEN: NEGATIVE

## 2018-09-27 LAB — LIPASE, BLOOD: Lipase: 26 U/L (ref 11–51)

## 2018-09-27 LAB — CBG MONITORING, ED: GLUCOSE-CAPILLARY: 96 mg/dL (ref 70–99)

## 2018-09-27 MED ORDER — ATORVASTATIN CALCIUM 10 MG PO TABS
10.0000 mg | ORAL_TABLET | Freq: Every day | ORAL | Status: DC
  Administered 2018-09-27 – 2018-09-28 (×2): 10 mg via ORAL
  Filled 2018-09-27 (×2): qty 1

## 2018-09-27 MED ORDER — GUAIFENESIN-DM 100-10 MG/5ML PO SYRP: 10.0000 mL | ORAL_SOLUTION | ORAL | Status: DC | PRN

## 2018-09-27 MED ORDER — SIMETHICONE 80 MG PO CHEW
40.0000 mg | CHEWABLE_TABLET | Freq: Four times a day (QID) | ORAL | Status: DC | PRN
  Administered 2018-09-27: 40 mg via ORAL
  Filled 2018-09-27: qty 1

## 2018-09-27 MED ORDER — HYDROCORTISONE 2.5 % RE CREA: 1.0000 "application " | TOPICAL_CREAM | Freq: Four times a day (QID) | RECTAL | Status: DC | PRN

## 2018-09-27 MED ORDER — DIPHENHYDRAMINE HCL 12.5 MG/5ML PO ELIX: 12.5000 mg | ORAL_SOLUTION | Freq: Four times a day (QID) | ORAL | Status: DC | PRN

## 2018-09-27 MED ORDER — IOPAMIDOL (ISOVUE-300) INJECTION 61%
INTRAVENOUS | Status: AC
  Filled 2018-09-27: qty 100

## 2018-09-27 MED ORDER — GABAPENTIN 300 MG PO CAPS
300.0000 mg | ORAL_CAPSULE | Freq: Every day | ORAL | Status: DC
  Administered 2018-09-27 – 2018-09-28 (×2): 300 mg via ORAL
  Filled 2018-09-27 (×2): qty 1

## 2018-09-27 MED ORDER — INSULIN ASPART 100 UNIT/ML ~~LOC~~ SOLN: 0.0000 [IU] | Freq: Every day | SUBCUTANEOUS | Status: DC

## 2018-09-27 MED ORDER — LACTATED RINGERS IV BOLUS
1000.0000 mL | Freq: Once | INTRAVENOUS | Status: AC
  Administered 2018-09-27: 1000 mL via INTRAVENOUS

## 2018-09-27 MED ORDER — METHOCARBAMOL 1000 MG/10ML IJ SOLN: 1000.0000 mg | Freq: Four times a day (QID) | INTRAVENOUS | Status: DC | PRN

## 2018-09-27 MED ORDER — PANTOPRAZOLE SODIUM 40 MG PO TBEC
40.0000 mg | DELAYED_RELEASE_TABLET | Freq: Every day | ORAL | Status: DC
  Administered 2018-09-28 – 2018-09-29 (×2): 40 mg via ORAL
  Filled 2018-09-27 (×2): qty 1

## 2018-09-27 MED ORDER — SODIUM CHLORIDE (PF) 0.9 % IJ SOLN
INTRAMUSCULAR | Status: AC
  Filled 2018-09-27: qty 50

## 2018-09-27 MED ORDER — METOCLOPRAMIDE HCL 5 MG/ML IJ SOLN: 5.0000 mg | Freq: Three times a day (TID) | INTRAMUSCULAR | Status: DC | PRN

## 2018-09-27 MED ORDER — METHOCARBAMOL 500 MG PO TABS: 1000.0000 mg | ORAL_TABLET | Freq: Four times a day (QID) | ORAL | Status: DC | PRN

## 2018-09-27 MED ORDER — ONDANSETRON 4 MG PO TBDP: 4.0000 mg | ORAL_TABLET | Freq: Four times a day (QID) | ORAL | Status: DC | PRN

## 2018-09-27 MED ORDER — LIP MEDEX EX OINT
1.0000 "application " | TOPICAL_OINTMENT | Freq: Two times a day (BID) | CUTANEOUS | Status: DC
  Administered 2018-09-27 – 2018-09-29 (×5): 1 via TOPICAL
  Filled 2018-09-27: qty 7

## 2018-09-27 MED ORDER — ONDANSETRON HCL 4 MG/2ML IJ SOLN: 4.0000 mg | Freq: Four times a day (QID) | INTRAMUSCULAR | Status: DC | PRN

## 2018-09-27 MED ORDER — MENTHOL 3 MG MT LOZG
1.0000 | LOZENGE | OROMUCOSAL | Status: DC | PRN
  Filled 2018-09-27: qty 9

## 2018-09-27 MED ORDER — ONDANSETRON HCL 4 MG/2ML IJ SOLN: 4.0000 mg | Freq: Once | INTRAMUSCULAR | Status: DC | PRN

## 2018-09-27 MED ORDER — SODIUM CHLORIDE 0.9 % IV BOLUS
500.0000 mL | Freq: Once | INTRAVENOUS | Status: AC
  Administered 2018-09-27: 500 mL via INTRAVENOUS

## 2018-09-27 MED ORDER — LACTATED RINGERS IV SOLN
INTRAVENOUS | Status: DC
  Administered 2018-09-27 – 2018-09-28 (×2): via INTRAVENOUS
  Administered 2018-09-28: 100 mL/h via INTRAVENOUS
  Administered 2018-09-28 – 2018-09-29 (×2): via INTRAVENOUS

## 2018-09-27 MED ORDER — LACTATED RINGERS IV SOLN
INTRAVENOUS | Status: DC
  Administered 2018-09-27: 16:00:00 via INTRAVENOUS

## 2018-09-27 MED ORDER — VITAMIN B-12 1000 MCG PO TABS
1000.0000 ug | ORAL_TABLET | Freq: Every day | ORAL | Status: DC
  Administered 2018-09-28 – 2018-09-29 (×2): 1000 ug via ORAL
  Filled 2018-09-27 (×2): qty 1

## 2018-09-27 MED ORDER — SODIUM CHLORIDE 0.9 % IV SOLN: 8.0000 mg | Freq: Four times a day (QID) | INTRAVENOUS | Status: DC | PRN

## 2018-09-27 MED ORDER — ALUM & MAG HYDROXIDE-SIMETH 200-200-20 MG/5ML PO SUSP: 30.0000 mL | Freq: Four times a day (QID) | ORAL | Status: DC | PRN

## 2018-09-27 MED ORDER — ASPIRIN EC 81 MG PO TBEC
81.0000 mg | DELAYED_RELEASE_TABLET | Freq: Every day | ORAL | Status: DC
  Administered 2018-09-28 – 2018-09-29 (×2): 81 mg via ORAL
  Filled 2018-09-27 (×2): qty 1

## 2018-09-27 MED ORDER — IOPAMIDOL (ISOVUE-300) INJECTION 61%
100.0000 mL | Freq: Once | INTRAVENOUS | Status: AC | PRN
  Administered 2018-09-27: 100 mL via INTRAVENOUS

## 2018-09-27 MED ORDER — LACTATED RINGERS IV BOLUS: 1000.0000 mL | Freq: Three times a day (TID) | INTRAVENOUS | Status: DC | PRN

## 2018-09-27 MED ORDER — PSYLLIUM 95 % PO PACK
1.0000 | PACK | Freq: Every day | ORAL | Status: DC
  Administered 2018-09-28 – 2018-09-29 (×2): 1 via ORAL
  Filled 2018-09-27 (×2): qty 1

## 2018-09-27 MED ORDER — HYDROCORTISONE 1 % EX CREA: 1.0000 "application " | TOPICAL_CREAM | Freq: Three times a day (TID) | CUTANEOUS | Status: DC | PRN

## 2018-09-27 MED ORDER — PHENOL 1.4 % MT LIQD: 1.0000 | OROMUCOSAL | Status: DC | PRN

## 2018-09-27 MED ORDER — BISMUTH SUBSALICYLATE 262 MG/15ML PO SUSP: 30.0000 mL | Freq: Three times a day (TID) | ORAL | Status: DC | PRN

## 2018-09-27 MED ORDER — ACETAMINOPHEN 500 MG PO TABS
1000.0000 mg | ORAL_TABLET | Freq: Three times a day (TID) | ORAL | Status: DC
  Administered 2018-09-27 – 2018-09-29 (×5): 1000 mg via ORAL
  Filled 2018-09-27 (×5): qty 2

## 2018-09-27 MED ORDER — METOCLOPRAMIDE HCL 10 MG PO TABS: 5.0000 mg | ORAL_TABLET | Freq: Three times a day (TID) | ORAL | Status: DC | PRN

## 2018-09-27 MED ORDER — SACCHAROMYCES BOULARDII 250 MG PO CAPS
250.0000 mg | ORAL_CAPSULE | Freq: Two times a day (BID) | ORAL | Status: DC
  Administered 2018-09-27 – 2018-09-29 (×4): 250 mg via ORAL
  Filled 2018-09-27 (×4): qty 1

## 2018-09-27 MED ORDER — MAGIC MOUTHWASH: 15.0000 mL | Freq: Four times a day (QID) | ORAL | Status: DC | PRN

## 2018-09-27 MED ORDER — INSULIN ASPART 100 UNIT/ML ~~LOC~~ SOLN
0.0000 [IU] | Freq: Three times a day (TID) | SUBCUTANEOUS | Status: DC
  Administered 2018-09-28: 3 [IU] via SUBCUTANEOUS
  Administered 2018-09-29: 2 [IU] via SUBCUTANEOUS

## 2018-09-27 MED ORDER — LEVOTHYROXINE SODIUM 88 MCG PO TABS
88.0000 ug | ORAL_TABLET | Freq: Every day | ORAL | Status: DC
  Administered 2018-09-28 – 2018-09-29 (×2): 88 ug via ORAL
  Filled 2018-09-27 (×2): qty 1

## 2018-09-27 MED ORDER — DIPHENHYDRAMINE HCL 50 MG/ML IJ SOLN: 12.5000 mg | Freq: Four times a day (QID) | INTRAMUSCULAR | Status: DC | PRN

## 2018-09-27 MED ORDER — PROCHLORPERAZINE EDISYLATE 10 MG/2ML IJ SOLN: 5.0000 mg | INTRAMUSCULAR | Status: DC | PRN

## 2018-09-27 NOTE — ED Notes (Signed)
Patient transported to CT 

## 2018-09-27 NOTE — Patient Instructions (Signed)
Medication Instructions:  Your physician recommends that you continue on your current medications as directed. Please refer to the Current Medication list given to you today.  If you need a refill on your cardiac medications before your next appointment, please call your pharmacy.   Lab work: none If you have labs (blood work) drawn today and your tests are completely normal, you will receive your results only by: Marland Kitchen MyChart Message (if you have MyChart) OR . A paper copy in the mail If you have any lab test that is abnormal or we need to change your treatment, we will call you to review the results.  Testing/Procedures: Your physician has recommended that you wear a 7 day monitor.  Monitors are medical devices that record the heart's electrical activity. Doctors most often use these monitors to diagnose arrhythmias. Arrhythmias are problems with the speed or rhythm of the heartbeat. The monitor is a small, portable device. You can wear one while you do your normal daily activities. This is usually used to diagnose what is causing palpitations/syncope (passing out).    Follow-Up: Your physician recommends that you schedule a follow-up appointment in: about 3  months
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
## 2018-09-27 NOTE — ED Triage Notes (Signed)
Pt started vomiting while at heart doctor's office this morning. Emesis Has been dark green and brown. Denies pain at this time  Had surgery last Thursday for colon cancer.

## 2018-09-27 NOTE — ED Notes (Signed)
Unsuccessful IV sticks x 2.

## 2018-09-27 NOTE — ED Notes (Signed)
ED TO INPATIENT HANDOFF REPORT  Name/Age/Gender [name] [name] 72 y.o. male  Code Status    Code Status Orders  (From admission, onward)         Start     Ordered   09/27/18 1508  Full code  Continuous     09/27/18 1515        Code Status History    Date Active Date Inactive Code Status Order ID Comments User Context   09/19/2018 1241 09/23/2018 1358 Full Code [id number]  Michael Boston, MD Inpatient   02/15/2015 2034 02/16/2015 1229 Full Code [id number]  Evans Lance, MD Inpatient      Home/SNF/Other Home  Chief Complaint post opt / vomiting bowel   Level of Care/Admitting Diagnosis ED Disposition    ED Disposition Condition [facility] Area: [facility] [[id number]]  Level of Care: Med-Surg [16]  Diagnosis: Ileus, postoperative [facility]) [[id number]]  Admitting Physician: [place], [place]  Attending Physician: Michael Boston [[id number]]  Estimated length of stay: 3 - 4 days  Certification:: I certify this patient will need inpatient services for at least 2 midnights  Bed request comments: 5 W  PT Class (Do Not Modify): Inpatient [101]  PT Acc Code (Do Not Modify): Private [1]       Medical History Past Medical History:  Diagnosis Date  . Bladder infection    10th grade  . CAD (coronary artery disease)    s/p 3V CABG 2001  . Cervical spondylosis 2002   History of  . Colon cancer ([place])   . Diabetes mellitus ([place])   . Dysphagia   . GERD (gastroesophageal reflux disease)   . HTN (hypertension)   . Hyperlipidemia   . Hypothyroidism   . LVH (left ventricular hypertrophy) 2015   Mild  . Myocardial infarction ([place])    x2  . PVC (premature ventricular contraction)    a. s/p ablation  . Tobacco abuse, in remission     Allergies No Known Allergies  IV Location/Drains/Wounds Patient Lines/Drains/Airways Status   Active Line/Drains/Airways    Name:   Placement date:   Placement time:   Site:   Days:   Peripheral IV 09/27/18  Right;Upper;Distal;Lateral Arm   09/27/18    1315    Arm   less than 1   Incision (Closed) 09/19/18 Abdomen Other (Comment)   09/19/18    1120     8   Incision - 5 Ports Abdomen Right;Upper Right;Lateral;Upper Right;Mid;Medial Right;Lateral;Lower Right;Lower;Medial   09/19/18    0805     8          Labs/Imaging Results for orders placed or performed during the hospital encounter of 09/27/18 (from the past 48 hour(s))  Urinalysis, Routine w reflex microscopic     Status: Abnormal   Collection Time: 09/27/18 11:10 AM  Result Value Ref Range   Color, Urine YELLOW YELLOW   APPearance CLEAR CLEAR   Specific Gravity, Urine 1.008 1.005 - 1.030   pH 6.0 5.0 - 8.0   Glucose, UA 50 (A) NEGATIVE mg/dL   Hgb urine dipstick NEGATIVE NEGATIVE   Bilirubin Urine NEGATIVE NEGATIVE   Ketones, ur NEGATIVE NEGATIVE mg/dL   Protein, ur NEGATIVE NEGATIVE mg/dL   Nitrite NEGATIVE NEGATIVE   Leukocytes, UA NEGATIVE NEGATIVE    Comment: Performed at [facility] [address], [place], [place] [address]  Type and screen Industry     Status: None   Collection  Time: 09/27/18 12:05 PM  Result Value Ref Range   ABO/RH(D) A POS    Antibody Screen NEG    Sample Expiration      09/30/2018 Performed at [facility] [place], [place] [address], [place], [place] [address]   Lipase, blood     Status: None   Collection Time: 09/27/18 12:09 PM  Result Value Ref Range   Lipase 26 11 - 51 U/L    Comment: Performed at [facility], [place] [address], [place], [place] [address]  Comprehensive metabolic panel     Status: Abnormal   Collection Time: 09/27/18 12:09 PM  Result Value Ref Range   Sodium 136 135 - 145 mmol/L   Potassium 3.9 3.5 - 5.1 mmol/L   Chloride 97 (L) 98 - 111 mmol/L   CO2 31 22 - 32 mmol/L   Glucose, Bld 167 (H) 70 - 99 mg/dL   BUN 11 8 - 23 mg/dL   Creatinine, Ser 0.92 0.61 - 1.24 mg/dL   Calcium 8.7 (L) 8.9 - 10.3 mg/dL    Total Protein 6.7 6.5 - 8.1 g/dL   Albumin 3.0 (L) 3.5 - 5.0 g/dL   AST 38 15 - 41 U/L   ALT 40 0 - 44 U/L   Alkaline Phosphatase 121 38 - 126 U/L   Total Bilirubin 1.6 (H) 0.3 - 1.2 mg/dL   GFR calc non Af Amer >60 >60 mL/min   GFR calc Af Amer >60 >60 mL/min    Comment: (NOTE) The eGFR has been calculated using the CKD EPI equation. This calculation has not been validated in all clinical situations. eGFR's persistently <60 mL/min signify possible Chronic Kidney Disease.    Anion gap 8 5 - 15    Comment: Performed at [facility], [place] [address], [place], [place] [address]  CBC     Status: Abnormal   Collection Time: 09/27/18 12:09 PM  Result Value Ref Range   WBC 13.7 (H) 4.0 - 10.5 K/uL   RBC 3.17 (L) 4.22 - 5.81 MIL/uL   Hemoglobin 10.2 (L) 13.0 - 17.0 g/dL   HCT 31.7 (L) 39.0 - 52.0 %   MCV 100.0 80.0 - 100.0 fL   MCH 32.2 26.0 - 34.0 pg   MCHC 32.2 30.0 - 36.0 g/dL   RDW 14.1 11.5 - 15.5 %   Platelets 298 150 - 400 K/uL   nRBC 0.1 0.0 - 0.2 %    Comment: Performed at [facility], [place] [address] [place],  [address]  Magnesium     Status: None   Collection Time: 09/27/18 12:09 PM  Result Value Ref Range   Magnesium 1.8 1.7 - 2.4 mg/dL    Comment: Performed at [facility], [place] [address], [place],  [address]  I-Stat CG4 Lactic Acid, ED     Status: None   Collection Time: 09/27/18 12:11 PM  Result Value Ref Range   Lactic Acid, Venous 1.78 0.5 - 1.9 mmol/L  I-Stat CG4 Lactic Acid, ED     Status: None   Collection Time: 09/27/18  1:38 PM  Result Value Ref Range   Lactic Acid, Venous 1.57 0.5 - 1.9 mmol/L  CBG monitoring, ED     Status: None   Collection Time: 09/27/18  5:06 PM  Result Value Ref Range   Glucose-Capillary 96 70 - 99 mg/dL   Ct Abdomen Pelvis W Contrast  Result Date: 09/27/2018 CLINICAL DATA:  Vomiting. Status post surgery 8  days ago for colon cancer. Leukocytosis. EXAM: CT  ABDOMEN AND PELVIS WITH CONTRAST TECHNIQUE: Multidetector CT imaging of the abdomen and pelvis was performed using the standard protocol following bolus administration of intravenous contrast. CONTRAST:  172m ISOVUE-300 IOPAMIDOL (ISOVUE-300) INJECTION 61% COMPARISON:  Chest and abdomen radiographs obtained earlier today. Chest, abdomen and pelvis CT dated 08/15/2018. FINDINGS: Lower chest: Stable tiny subpleural nodule in the left lower lobe on image number 13 of series 7. Hepatobiliary: No focal liver abnormality is seen. Status post cholecystectomy. No biliary dilatation. Pancreas: Unremarkable. No pancreatic ductal dilatation or surrounding inflammatory changes. Spleen: Normal in size without focal abnormality. Adrenals/Urinary Tract: Small amount of air in the urinary bladder. Normal appearing kidneys, ureters and adrenal glands. Stomach/Bowel: Proximal sigmoid colon anastomosis. Prominent stool in the colon. Normal appearing appendix. Unremarkable small bowel and stomach. Vascular/Lymphatic: Atheromatous arterial calcifications without aneurysm. No enlarged lymph nodes. Reproductive: Mildly to moderately enlarged prostate gland containing coarse calcifications. Other: Left mid to upper abdominal fluid collection laterally with adjacent soft tissue stranding in a thin surrounding enhancing rim. This has a small amount of dependent medium density material. This fluid collection measures 9.3 x 5.0 cm on image number 25 series 2 and 8.0 cm in length on coronal image number 69. This has a broad base against the inferior aspect of the spleen with flattening of that portion of the spleen. Left rectus abdominus muscle hematoma measuring 5.6 x 4.1 cm on image number 62 series 2. This measures 3.6 cm in length on sagittal image number 89. There is also a small amount of fluid and gas between the anterior aspects of the rectus abdominus muscles, measuring 5.2 x 2.1 cm on image number 68 series 2. This measures 8.2 cm in  length on sagittal image number 75. Also demonstrated is multiple locules of subcutaneous air anterior to the anterior abdominal wall. These are more focal and more numerous at the level of the right mid abdomen, with a small fluid collection containing an air-fluid level. The fluid collection measures 2.2 x 1.5 cm on image number 40 series 2. Musculoskeletal: Lumbar and lower thoracic spine degenerative changes. IMPRESSION: 1. 9.3 x 5.0 x 8.0 cm left mid to upper abdominal probable abscess. This may represent an infected inferior splenic subcapsular hematoma. 2. 5.6 x 4.1 x 2.13.6 cm left rectus abdominus muscle hematoma. 3. 8.2 x 5.2 x 2.1 cm fluid collection with a mild amount of air or gas between the anterior aspects of the rectus abdominus muscles. This could represent a postoperative seroma or developing abscess. 4. Multiple locules of subcutaneous air anterior to the anterior abdominal wall, as described above with a 2.2 cm fluid collection containing an air-fluid level on the right. This is most likely a small postoperative hematoma. An infected hematoma is possible. 5. Small amount of air in the urinary bladder. This is most likely due to recent catheterization. 6. Prominent stool in the colon. Electronically Signed   By: SClaudie ReveringM.D.   On: 09/27/2018 14:18   Dg Abd Acute W/chest  Result Date: 09/27/2018 CLINICAL DATA:  Vomiting.  Surgery last Thursday for colon cancer. EXAM: DG ABDOMEN ACUTE W/ 1V CHEST COMPARISON:  CT scans 08/15/2018 FINDINGS: The cardiac silhouette, mediastinal and hilar contours are within normal limits. There is mild tortuosity and calcification of the thoracic aorta. Stable surgical changes from bypass surgery and neck surgery. The lungs are clear. No infiltrates, edema or effusions. No pneumothorax. Moderate stool in the colon. No dilated loops of small  bowel to suggest obstruction. The soft tissue shadows of the abdomen are maintained. No free air. The bony structures  are intact. 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 IMPRESSION: No acute cardiopulmonary findings. No plain film findings for an acute abdominal process. Electronically Signed   By: Marijo Sanes M.D.   On: 09/27/2018 12:27   EKG Interpretation  Date/Time:  Friday September 27 2018 12:48:58 EST Ventricular Rate:  77 PR Interval:    QRS Duration: 95 QT Interval:  422 QTC Calculation: 478 R Axis:   -43 Text Interpretation:  Sinus rhythm Ventricular bigeminy Left axis deviation Low voltage, precordial leads Abnormal R-wave progression, early transition Borderline prolonged QT interval Baseline wander in lead(s) V1 Confirmed by Dene Gentry [phone]) on 09/27/2018 12:57:21 PM   Pending Labs Unresulted Labs (From admission, onward)    Start     Ordered   09/28/18 0500  CBC  Tomorrow morning,   R    Question:  Specimen collection method  Answer:  IV Team   09/27/18 1515   09/28/18 9937  Basic metabolic panel  Tomorrow morning,   R    Comments:  If K < 3.5, give 17mq KCl in 10MEq runs per protocol.  Pharmacy may adjust dosing strength, schedule, rate of infusion, etc as needed to optimize therapy   Question:  Specimen collection method  Answer:  IV Team   09/27/18 1515          Vitals/Pain Today's Vitals   09/27/18 1430 09/27/18 1500 09/27/18 1700 09/27/18 1800  BP: (!) 132/93 131/63 (!) 153/71 140/65  Pulse: 76 74 79 71  Resp: 18 19 18 18   Temp:      TempSrc:      SpO2: 98% 100% 99% 100%  PainSc:        Isolation Precautions No active isolations  Medications Medications  ondansetron (ZOFRAN) 8 mg in sodium chloride 0.9 % 50 mL IVPB (has no administration in time range)  prochlorperazine (COMPAZINE) injection 5-10 mg (has no administration in time range)  lip balm (CARMEX) ointment 1 application (1 application Topical Given 09/27/18 1406)  magic mouthwash (has no administration in time range)  alum & mag hydroxide-simeth (MAALOX/MYLANTA) 200-200-20 MG/5ML  suspension 30 mL (has no administration in time range)  iopamidol (ISOVUE-300) 61 % injection (has no administration in time range)  sodium chloride (PF) 0.9 % injection (has no administration in time range)  lactated ringers infusion ( Intravenous New Bag/Given 09/27/18 1809)  diphenhydrAMINE (BENADRYL) 12.5 MG/5ML elixir 12.5 mg (has no administration in time range)    Or  diphenhydrAMINE (BENADRYL) injection 12.5 mg (has no administration in time range)  ondansetron (ZOFRAN-ODT) disintegrating tablet 4 mg (has no administration in time range)    Or  ondansetron (ZOFRAN) injection 4 mg (has no administration in time range)  simethicone (MYLICON) chewable tablet 40 mg (has no administration in time range)  lactated ringers bolus 1,000 mL (has no administration in time range)  methocarbamol (ROBAXIN) 1,000 mg in dextrose 5 % 50 mL IVPB (has no administration in time range)  methocarbamol (ROBAXIN) tablet 1,000 mg (has no administration in time range)  metoCLOPramide (REGLAN) injection 5-10 mg (has no administration in time range)  metoCLOPramide (REGLAN) tablet 5-10 mg (has no administration in time range)  saccharomyces boulardii (FLORASTOR) capsule 250 mg (has no administration in time range)  psyllium (HYDROCIL/METAMUCIL) packet 1 packet (has no administration in time range)  bismuth subsalicylate (PEPTO BISMOL) 262 MG/15ML suspension 30 mL (has no administration  in time range)  guaiFENesin-dextromethorphan (ROBITUSSIN DM) 100-10 MG/5ML syrup 10 mL (has no administration in time range)  hydrocortisone (ANUSOL-HC) 2.5 % rectal cream 1 application (has no administration in time range)  hydrocortisone cream 1 % 1 application (has no administration in time range)  menthol-cetylpyridinium (CEPACOL) lozenge 3 mg (has no administration in time range)  phenol (CHLORASEPTIC) mouth spray 1-2 spray (has no administration in time range)  acetaminophen (TYLENOL) tablet 1,000 mg (has no administration  in time range)  gabapentin (NEURONTIN) capsule 300 mg (has no administration in time range)  insulin aspart (novoLOG) injection 0-15 Units (has no administration in time range)  insulin aspart (novoLOG) injection 0-5 Units (has no administration in time range)  aspirin EC tablet 81 mg (has no administration in time range)  atorvastatin (LIPITOR) tablet 10 mg (has no administration in time range)  levothyroxine (SYNTHROID, LEVOTHROID) tablet 88 mcg (has no administration in time range)  pantoprazole (PROTONIX) EC tablet 40 mg (has no administration in time range)  vitamin B-12 (CYANOCOBALAMIN) tablet 1,000 mcg (has no administration in time range)  sodium chloride 0.9 % bolus 500 mL (0 mLs Intravenous Stopped 09/27/18 1443)  lactated ringers bolus 1,000 mL (1,000 mLs Intravenous New Bag/Given 09/27/18 1444)  iopamidol (ISOVUE-300) 61 % injection 100 mL (100 mLs Intravenous Contrast Given 09/27/18 1339)    Mobility walks

## 2018-09-28 DIAGNOSIS — R112 Nausea with vomiting, unspecified: Secondary | ICD-10-CM | POA: Diagnosis not present

## 2018-09-28 DIAGNOSIS — K9189 Other postprocedural complications and disorders of digestive system: Secondary | ICD-10-CM | POA: Diagnosis not present

## 2018-09-28 LAB — GLUCOSE, CAPILLARY
GLUCOSE-CAPILLARY: 153 mg/dL — AB (ref 70–99)
Glucose-Capillary: 120 mg/dL — ABNORMAL HIGH (ref 70–99)
Glucose-Capillary: 125 mg/dL — ABNORMAL HIGH (ref 70–99)
Glucose-Capillary: 96 mg/dL (ref 70–99)
Glucose-Capillary: 155 mg/dL — ABNORMAL HIGH (ref 70–99)

## 2018-09-28 LAB — BASIC METABOLIC PANEL
ANION GAP: 7 (ref 5–15)
BUN: 8 mg/dL (ref 8–23)
CHLORIDE: 104 mmol/L (ref 98–111)
CO2: 27 mmol/L (ref 22–32)
Calcium: 8.4 mg/dL — ABNORMAL LOW (ref 8.9–10.3)
Creatinine, Ser: 0.75 mg/dL (ref 0.61–1.24)
GFR calc Af Amer: >60 mL/min (ref >60)
GFR calc non Af Amer: >60 mL/min (ref >60)
Glucose, Bld: 119 mg/dL — ABNORMAL HIGH (ref 70–99)
Potassium: 3.6 mmol/L (ref 3.5–5.1)
Sodium: 138 mmol/L (ref 135–145)

## 2018-09-28 LAB — CBC
HEMATOCRIT: 30.4 % — AB (ref 39.0–52.0)
HEMOGLOBIN: 9.7 g/dL — AB (ref 13.0–17.0)
MCH: 31.3 pg (ref 26.0–34.0)
MCHC: 31.9 g/dL (ref 30.0–36.0)
MCV: 98.1 fL (ref 80.0–100.0)
NRBC: 0 % (ref 0.0–0.2)
Platelets: 314 10*3/uL (ref 150–400)
RBC: 3.1 MIL/uL — ABNORMAL LOW (ref 4.22–5.81)
RDW: 14.4 % (ref 11.5–15.5)
WBC: 10.9 10*3/uL — ABNORMAL HIGH (ref 4.0–10.5)

## 2018-09-28 NOTE — Progress Notes (Signed)
Subjective/Chief Complaint: Wife at [place] he feels a whole lot better than yesterday No n/v since admit C/o cold fingers/toes ever since surgery and knot in abd wall   Objective: Vital signs in last 24 hours: Temp:  [97.5 F (36.4 C)-98.1 F (36.7 C)] 97.5 F (36.4 C) (11/23 0611) Pulse Rate:  [61-79] 79 (11/22 2159) Resp:  [16-20] 18 (11/23 0611) BP: (130-153)/(62-93) 134/65 (11/23 0611) SpO2:  [97 %-100 %] 97 % (11/22 2159) Weight:  [92.2 kg] 92.2 kg (11/23 0600) Last BM Date: 09/28/18  Intake/Output from previous day: 11/22 0701 - 11/23 0700 In: 1764.6 [P.O.:480; I.V.:784.6; IV Piggyback:500] Out: 1750 [Urine:1750] Intake/Output this shift: Total I/O In: 680 [P.O.:680] Out: 1300 [Urine:1300]  Alert, nontoxic, not ill appearing Some tachy cta Soft, mild distension, some bruising, essentially nontender No edema  Lab Results:  Recent Labs    09/27/18 1209 09/28/18 0322  WBC 13.7* 10.9*  HGB 10.2* 9.7*  HCT 31.7* 30.4*  PLT 298 314   BMET Recent Labs    09/27/18 1209 09/28/18 0322  NA 136 138  K 3.9 3.6  CL 97* 104  CO2 31 27  GLUCOSE 167* 119*  BUN 11 8  CREATININE 0.92 0.75  CALCIUM 8.7* 8.4*   PT/INR No results for input(s): LABPROT, INR in the last 72 hours. ABG No results for input(s): PHART, HCO3 in the last 72 hours.  Invalid input(s): PCO2, PO2  Studies/Results: Ct Abdomen Pelvis W Contrast  Result Date: 09/27/2018 CLINICAL DATA:  Vomiting. Status post surgery 8 days ago for colon cancer. Leukocytosis. EXAM: CT ABDOMEN AND PELVIS WITH CONTRAST TECHNIQUE: Multidetector CT imaging of the abdomen and pelvis was performed using the standard protocol following bolus administration of intravenous contrast. CONTRAST:  172mL ISOVUE-300 IOPAMIDOL (ISOVUE-300) INJECTION 61% COMPARISON:  Chest and abdomen radiographs obtained earlier today. Chest, abdomen and pelvis CT dated 08/15/2018. FINDINGS: Lower chest: Stable tiny subpleural nodule in  the left lower lobe on image number 13 of series 7. Hepatobiliary: No focal liver abnormality is seen. Status post cholecystectomy. No biliary dilatation. Pancreas: Unremarkable. No pancreatic ductal dilatation or surrounding inflammatory changes. Spleen: Normal in size without focal abnormality. Adrenals/Urinary Tract: Small amount of air in the urinary bladder. Normal appearing kidneys, ureters and adrenal glands. Stomach/Bowel: Proximal sigmoid colon anastomosis. Prominent stool in the colon. Normal appearing appendix. Unremarkable small bowel and stomach. Vascular/Lymphatic: Atheromatous arterial calcifications without aneurysm. No enlarged lymph nodes. Reproductive: Mildly to moderately enlarged prostate gland containing coarse calcifications. Other: Left mid to upper abdominal fluid collection laterally with adjacent soft tissue stranding in a thin surrounding enhancing rim. This has a small amount of dependent medium density material. This fluid collection measures 9.3 x 5.0 cm on image number 25 series 2 and 8.0 cm in length on coronal image number 69. This has a broad base against the inferior aspect of the spleen with flattening of that portion of the spleen. Left rectus abdominus muscle hematoma measuring 5.6 x 4.1 cm on image number 62 series 2. This measures 3.6 cm in length on sagittal image number 89. There is also a small amount of fluid and gas between the anterior aspects of the rectus abdominus muscles, measuring 5.2 x 2.1 cm on image number 68 series 2. This measures 8.2 cm in length on sagittal image number 75. Also demonstrated is multiple locules of subcutaneous air anterior to the anterior abdominal wall. These are more focal and more numerous at the level of the right mid abdomen, with a  small fluid collection containing an air-fluid level. The fluid collection measures 2.2 x 1.5 cm on image number 40 series 2. Musculoskeletal: Lumbar and lower thoracic spine degenerative changes.  IMPRESSION: 1. 9.3 x 5.0 x 8.0 cm left mid to upper abdominal probable abscess. This may represent an infected inferior splenic subcapsular hematoma. 2. 5.6 x 4.1 x 2.13.6 cm left rectus abdominus muscle hematoma. 3. 8.2 x 5.2 x 2.1 cm fluid collection with a mild amount of air or gas between the anterior aspects of the rectus abdominus muscles. This could represent a postoperative seroma or developing abscess. 4. Multiple locules of subcutaneous air anterior to the anterior abdominal wall, as described above with a 2.2 cm fluid collection containing an air-fluid level on the right. This is most likely a small postoperative hematoma. An infected hematoma is possible. 5. Small amount of air in the urinary bladder. This is most likely due to recent catheterization. 6. Prominent stool in the colon. Electronically Signed   By: Claudie Revering M.D.   On: 09/27/2018 14:18   Dg Abd Acute W/chest  Result Date: 09/27/2018 CLINICAL DATA:  Vomiting.  Surgery last Thursday for colon cancer. EXAM: DG ABDOMEN ACUTE W/ 1V CHEST COMPARISON:  CT scans 08/15/2018 FINDINGS: The cardiac silhouette, mediastinal and hilar contours are within normal limits. There is mild tortuosity and calcification of the thoracic aorta. Stable surgical changes from bypass surgery and neck surgery. The lungs are clear. No infiltrates, edema or effusions. No pneumothorax. Moderate stool in the colon. No dilated loops of small bowel to suggest obstruction. The soft tissue shadows of the abdomen are maintained. No free air. The bony structures are intact. 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 IMPRESSION: No acute cardiopulmonary findings. No plain film findings for an acute abdominal process. Electronically Signed   By: Marijo Sanes M.D.   On: 09/27/2018 12:27    Anti-infectives: Anti-infectives (From admission, onward)   None      Assessment/Plan: H/o robotic colectomy dr gross Readmit with n/v/multiple hematomas DM2 htn  Looks  good today No fever. Wbc better.  Cont soft diet scds only  Discussed cold toes/finger prob due to anemia.  Discussed why abx not prescribed on discharge If cont to do well, prob home in am Repeat cbc in am  LOS: 1 day    Greer Pickerel 09/28/2018

## 2018-09-29 DIAGNOSIS — K9189 Other postprocedural complications and disorders of digestive system: Secondary | ICD-10-CM | POA: Diagnosis not present

## 2018-09-29 DIAGNOSIS — R112 Nausea with vomiting, unspecified: Secondary | ICD-10-CM | POA: Diagnosis not present

## 2018-09-29 LAB — CBC
HEMATOCRIT: 33.3 % — AB (ref 39.0–52.0)
HEMOGLOBIN: 10.3 g/dL — AB (ref 13.0–17.0)
MCH: 31.2 pg (ref 26.0–34.0)
MCHC: 30.9 g/dL (ref 30.0–36.0)
MCV: 100.9 fL — AB (ref 80.0–100.0)
PLATELETS: 339 10*3/uL (ref 150–400)
RBC: 3.3 MIL/uL — AB (ref 4.22–5.81)
RDW: 14.7 % (ref 11.5–15.5)
WBC: 9.4 10*3/uL (ref 4.0–10.5)
nRBC: 0 % (ref 0.0–0.2)

## 2018-09-29 LAB — GLUCOSE, CAPILLARY: Glucose-Capillary: 141 mg/dL — ABNORMAL HIGH (ref 70–99)

## 2018-09-29 MED ORDER — ONDANSETRON HCL 4 MG PO TABS: 4.0000 mg | ORAL_TABLET | Freq: Three times a day (TID) | ORAL | 0 refills | Status: DC | PRN

## 2018-09-29 NOTE — Discharge Instructions (Signed)
SURGERY: POST OP INSTRUCTIONS °(Surgery for small bowel obstruction, colon resection, etc) ° ° °###################################################################### ° °EAT °Gradually transition to a high fiber diet with a fiber supplement over the next few days after discharge ° °WALK °Walk an hour a day.  Control your pain to do that.   ° °CONTROL PAIN °Control pain so that you can walk, sleep, tolerate sneezing/coughing, go up/down stairs. ° °HAVE A BOWEL MOVEMENT DAILY °Keep your bowels regular to avoid problems.  OK to try a laxative to override constipation.  OK to use an antidairrheal to slow down diarrhea.  Call if not better after 2 tries ° °CALL IF YOU HAVE PROBLEMS/CONCERNS °Call if you are still struggling despite following these instructions. °Call if you have concerns not answered by these instructions ° °###################################################################### ° ° °DIET °Follow a light diet the first few days at home.  Start with a bland diet such as soups, liquids, starchy foods, low fat foods, etc.  If you feel full, bloated, or constipated, stay on a ful liquid or pureed/blenderized diet for a few days until you feel better and no longer constipated. °Be sure to drink plenty of fluids every day to avoid getting dehydrated (feeling dizzy, not urinating, etc.). °Gradually add a fiber supplement to your diet over the next week.  Gradually get back to a regular solid diet.  Avoid fast food or heavy meals the first week as you are more likely to get nauseated. °It is expected for your digestive tract to need a few months to get back to normal.  It is common for your bowel movements and stools to be irregular.  You will have occasional bloating and cramping that should eventually fade away.  Until you are eating solid food normally, off all pain medications, and back to regular activities; your bowels will not be normal. °Focus on eating a low-fat, high fiber diet the rest of your life  (See Getting to Good Bowel Health, below). ° °CARE of your INCISION or WOUND °It is good for closed incision and even open wounds to be washed every day.  Shower every day.  Short baths are fine.  Wash the incisions and wounds clean with soap & water.    °If you have a closed incision(s), wash the incision with soap & water every day.  You may leave closed incisions open to air if it is dry.   You may cover the incision with clean gauze & replace it after your daily shower for comfort. °If you have skin tapes (Steristrips) or skin glue (Dermabond) on your incision, leave them in place.  They will fall off on their own like a scab.  You may trim any edges that curl up with clean scissors.  If you have staples, set up an appointment for them to be removed in the office in 10 days after surgery.  °If you have a drain, wash around the skin exit site with soap & water and place a new dressing of gauze or band aid around the skin every day.  Keep the drain site clean & dry.    °If you have an open wound with packing, see wound care instructions.  In general, it is encouraged that you remove your dressing and packing, shower with soap & water, and replace your dressing once a day.  Pack the wound with clean gauze moistened with normal (0.9%) saline to keep the wound moist & uninfected.  Pressure on the dressing for 30 minutes will stop most wound   bleeding.  Eventually your body will heal & pull the open wound closed over the next few months.  °Raw open wounds will occasionally bleed or secrete yellow drainage until it heals closed.  Drain sites will drain a little until the drain is removed.  Even closed incisions can have mild bleeding or drainage the first few days until the skin edges scab over & seal.   °If you have an open wound with a wound vac, see wound vac care instructions. ° ° ° ° °ACTIVITIES as tolerated °Start light daily activities --- self-care, walking, climbing stairs-- beginning the day after surgery.   Gradually increase activities as tolerated.  Control your pain to be active.  Stop when you are tired.  Ideally, walk several times a day, eventually an hour a day.   °Most people are back to most day-to-day activities in a few weeks.  It takes 4-8 weeks to get back to unrestricted, intense activity. °If you can walk 30 minutes without difficulty, it is safe to try more intense activity such as jogging, treadmill, bicycling, low-impact aerobics, swimming, etc. °Save the most intensive and strenuous activity for last (Usually 4-8 weeks after surgery) such as sit-ups, heavy lifting, contact sports, etc.  Refrain from any intense heavy lifting or straining until you are off narcotics for pain control.  You will have off days, but things should improve week-by-week. °DO NOT PUSH THROUGH PAIN.  Let pain be your guide: If it hurts to do something, don't do it.  Pain is your body warning you to avoid that activity for another week until the pain goes down. °You may drive when you are no longer taking narcotic prescription pain medication, you can comfortably wear a seatbelt, and you can safely make sudden turns/stops to protect yourself without hesitating due to pain. °You may have sexual intercourse when it is comfortable. If it hurts to do something, stop. ° °MEDICATIONS °Take your usually prescribed home medications unless otherwise directed.   °Blood thinners:  °Usually you can restart any strong blood thinners after the second postoperative day.  It is OK to take aspirin right away.    ° If you are on strong blood thinners (warfarin/Coumadin, Plavix, Xerelto, Eliquis, Pradaxa, etc), discuss with your surgeon, medicine PCP, and/or cardiologist for instructions on when to restart the blood thinner & if blood monitoring is needed (PT/INR blood check, etc).   ° ° °PAIN CONTROL °Pain after surgery or related to activity is often due to strain/injury to muscle, tendon, nerves and/or incisions.  This pain is usually  short-term and will improve in a few months.  °To help speed the process of healing and to get back to regular activity more quickly, DO THE FOLLOWING THINGS TOGETHER: °1. Increase activity gradually.  DO NOT PUSH THROUGH PAIN °2. Use Ice and/or Heat °3. Try Gentle Massage and/or Stretching °4. Take over the counter pain medication °5. Take Narcotic prescription pain medication for more severe pain ° °Good pain control = faster recovery.  It is better to take more medicine to be more active than to stay in bed all day to avoid medications. °1.  Increase activity gradually °Avoid heavy lifting at first, then increase to lifting as tolerated over the next 6 weeks. °Do not “push through” the pain.  Listen to your body and avoid positions and maneuvers than reproduce the pain.  Wait a few days before trying something more intense °Walking an hour a day is encouraged to help your body recover faster   and more safely.  Start slowly and stop when getting sore.  If you can walk 30 minutes without stopping or pain, you can try more intense activity (running, jogging, aerobics, cycling, swimming, treadmill, sex, sports, weightlifting, etc.) °Remember: If it hurts to do it, then don’t do it! °2. Use Ice and/or Heat °You will have swelling and bruising around the incisions.  This will take several weeks to resolve. °Ice packs or heating pads (6-8 times a day, 30-60 minutes at a time) will help sooth soreness & bruising. °Some people prefer to use ice alone, heat alone, or alternate between ice & heat.  Experiment and see what works best for you.  Consider trying ice for the first few days to help decrease swelling and bruising; then, switch to heat to help relax sore spots and speed recovery. °Shower every day.  Short baths are fine.  It feels good!  Keep the incisions and wounds clean with soap & water.   °3. Try Gentle Massage and/or Stretching °Massage at the area of pain many times a day °Stop if you feel pain - do not  overdo it °4. Take over the counter pain medication °This helps the muscle and nerve tissues become less irritable and calm down faster °Choose ONE of the following over-the-counter anti-inflammatory medications: °Acetaminophen 500mg tabs (Tylenol) 1-2 pills with every meal and just before bedtime (avoid if you have liver problems or if you have acetaminophen in you narcotic prescription) °Naproxen 220mg tabs (ex. Aleve, Naprosyn) 1-2 pills twice a day (avoid if you have kidney, stomach, IBD, or bleeding problems) °Ibuprofen 200mg tabs (ex. Advil, Motrin) 3-4 pills with every meal and just before bedtime (avoid if you have kidney, stomach, IBD, or bleeding problems) °Take with food/snack several times a day as directed for at least 2 weeks to help keep pain / soreness down & more manageable. °5. Take Narcotic prescription pain medication for more severe pain °A prescription for strong pain control is often given to you upon discharge (for example: oxycodone/Percocet, hydrocodone/Norco/Vicodin, or tramadol/Ultram) °Take your pain medication as prescribed. °Be mindful that most narcotic prescriptions contain Tylenol (acetaminophen) as well - avoid taking too much Tylenol. °If you are having problems/concerns with the prescription medicine (does not control pain, nausea, vomiting, rash, itching, etc.), please call us [phone] to see if we need to switch you to a different pain medicine that will work better for you and/or control your side effects better. °If you need a refill on your pain medication, you must call the office before 4 pm and on weekdays only.  By federal law, prescriptions for narcotics cannot be called into a pharmacy.  They must be filled out on paper & picked up from our office by the patient or authorized caretaker.  Prescriptions cannot be filled after 4 pm nor on weekends.   ° °WHEN TO CALL US [phone] °Severe uncontrolled or worsening pain  °Fever over 101 F (38.5 C) °Concerns with  the incision: Worsening pain, redness, rash/hives, swelling, bleeding, or drainage °Reactions / problems with new medications (itching, rash, hives, nausea, etc.) °Nausea and/or vomiting °Difficulty urinating °Difficulty breathing °Worsening fatigue, dizziness, lightheadedness, blurred vision °Other concerns °If you are not getting better after two weeks or are noticing you are getting worse, contact our office [phone] for further advice.  We may need to adjust your medications, re-evaluate you in the office, send you to the emergency room, or see what other things we can do to help. °The   clinic staff is available to answer your questions during regular business hours (8:30am-5pm).  Please don’t hesitate to call and ask to speak to one of our nurses for clinical concerns.    °A surgeon from [facility] is always on call at the hospitals 24 hours/day °If you have a medical emergency, go to the nearest emergency room or call 911. ° °FOLLOW UP in our office °One the day of your discharge from the hospital (or the next business weekday), please call [facility] to set up or confirm an appointment to see your surgeon in the office for a follow-up appointment.  Usually it is 2-3 weeks after your surgery.   °If you have skin staples at your incision(s), let the office know so we can set up a time in the office for the nurse to remove them (usually around 10 days after surgery). °Make sure that you call for appointments the day of discharge (or the next business weekday) from the hospital to ensure a convenient appointment time. °IF YOU HAVE DISABILITY OR FAMILY LEAVE FORMS, BRING THEM TO THE OFFICE FOR PROCESSING.  DO NOT GIVE THEM TO YOUR DOCTOR. ° °[facility] °[address], [address], [place], [place]  [address] ? °[phone] - Main °[phone] - Toll Free,  [phone] - Fax °[email or web address] ° °GETTING TO GOOD BOWEL HEALTH. °It is  expected for your digestive tract to need a few months to get back to normal.  It is common for your bowel movements and stools to be irregular.  You will have occasional bloating and cramping that should eventually fade away.  Until you are eating solid food normally, off all pain medications, and back to regular activities; your bowels will not be normal.   °Avoiding constipation °The goal: ONE SOFT BOWEL MOVEMENT A DAY!    °Drink plenty of fluids.  Choose water first. °TAKE A FIBER SUPPLEMENT EVERY DAY THE REST OF YOUR LIFE °During your first week back home, gradually add back a fiber supplement every day °Experiment which form you can tolerate.   There are many forms such as powders, tablets, wafers, gummies, etc °Psyllium bran (Metamucil), methylcellulose (Citrucel), Miralax or Glycolax, Benefiber, Flax Seed.  °Adjust the dose week-by-week (1/2 dose/day to 6 doses a day) until you are moving your bowels 1-2 times a day.  Cut back the dose or try a different fiber product if it is giving you problems such as diarrhea or bloating. °Sometimes a laxative is needed to help jump-start bowels if constipated until the fiber supplement can help regulate your bowels.  If you are tolerating eating & you are farting, it is okay to try a gentle laxative such as double dose MiraLax, prune juice, or Milk of Magnesia.  Avoid using laxatives too often. °Stool softeners can sometimes help counteract the constipating effects of narcotic pain medicines.  It can also cause diarrhea, so avoid using for too long. °If you are still constipated despite taking fiber daily, eating solids, and a few doses of laxatives, call our office. °Controlling diarrhea °Try drinking liquids and eating bland foods for a few days to avoid stressing your intestines further. °Avoid dairy products (especially milk & ice cream) for a short time.  The intestines often can lose the ability to digest lactose when stressed. °Avoid foods that cause gassiness or  bloating.  Typical foods include beans and other legumes, cabbage, broccoli, and dairy foods.  Avoid greasy, spicy, fast foods.  Every person has   some sensitivity to other foods, so listen to your body and avoid those foods that trigger problems for you. °Probiotics (such as active yogurt, Align, etc) may help repopulate the intestines and colon with normal bacteria and calm down a sensitive digestive tract °Adding a fiber supplement gradually can help thicken stools by absorbing excess fluid and retrain the intestines to act more normally.  Slowly increase the dose over a few weeks.  Too much fiber too soon can backfire and cause cramping & bloating. °It is okay to try and slow down diarrhea with a few doses of antidiarrheal medicines.   °Bismuth subsalicylate (ex. Kayopectate, Pepto Bismol) for a few doses can help control diarrhea.  Avoid if pregnant.   °Loperamide (Imodium) can slow down diarrhea.  Start with one tablet (2mg) first.  Avoid if you are having fevers or severe pain.  °ILEOSTOMY PATIENTS WILL HAVE CHRONIC DIARRHEA since their colon is not in use.    °Drink plenty of liquids.  You will need to drink even more glasses of water/liquid a day to avoid getting dehydrated. °Record output from your ileostomy.  Expect to empty the bag every 3-4 hours at first.  Most people with a permanent ileostomy empty their bag 4-6 times at the least.   °Use antidiarrheal medicine (especially Imodium) several times a day to avoid getting dehydrated.  Start with a dose at bedtime & breakfast.  Adjust up or down as needed.  Increase antidiarrheal medications as directed to avoid emptying the bag more than 8 times a day (every 3 hours). °Work with your wound ostomy nurse to learn care for your ostomy.  See ostomy care instructions. °TROUBLESHOOTING IRREGULAR BOWELS °1) Start with a soft & bland diet. No spicy, greasy, or fried foods.  °2) Avoid gluten/wheat or dairy products from diet to see if symptoms improve. °3) Miralax  17gm or flax seed mixed in 8oz. water or juice-daily. May use 2-4 times a day as needed. °4) Gas-X, Phazyme, etc. as needed for gas & bloating.  °5) Prilosec (omeprazole) over-the-counter as needed °6)  Consider probiotics (Align, Activa, etc) to help calm the bowels down ° °Call your doctor if you are getting worse or not getting better.  Sometimes further testing (cultures, endoscopy, X-ray studies, CT scans, bloodwork, etc.) may be needed to help diagnose and treat the cause of the diarrhea. °[facility] °[address], [address], [place], [place]  [address] °[phone] - Main.    °[phone]  - Toll Free.   [phone] - Fax °[email or web address] ° ° °

## 2018-09-29 NOTE — Discharge Summary (Signed)
Physician Discharge Summary  [name] [name] QQV:[id number] DOB: [birth year] DOA: 09/27/2018  PCP: Leonard Downing, MD  Admit date: 09/27/2018 Discharge date: 09/29/2018  Recommendations for Outpatient Follow-up:    Follow-up Information    Michael Boston, MD Follow up.   Specialty:  General Surgery Why:  keep previously scheduled appointment Contact information: [address] [place] [address] [phone]          Discharge Diagnoses:  1. Nausea/vomiting - resolved 2. Intraabdominal hematomas 3. Abdominal wall hematoma 4. S/p robot assisted Left hemicolectomy 09/19/18 by Dr Johney Maine 5. DM 2 6. H/o HTN  Surgical Procedure: none  Discharge Condition: good Disposition: home  Diet recommendation: soft diet  Filed Weights   09/28/18 0600 09/29/18 0700  Weight: 92.2 kg 90.7 kg    History of present illness:  Pleasant elderly gentleman with a history of coronary disease and diabetes.  Positive colon cancer screening test.  Found to have mass in descending colon concerning for adenocarcinoma numerous polyps.  Underwent work-up and clearance.  Underwent robotically assisted left hemicolectomy 09/19/2018.  Went home postoperative day 4.  Was feeling good until this morning, he felt nauseated and threw up in his cardiologist office.  Thin but dark color.  Concerning.  Went home but threw up again.    Went to emergency room.  His wife at the bedside.  No fevers chills or sweats.  He has had intermittent loose stools but had a well formed bowel movement yesterday.  Denies any sick contacts or travel history.  Had been moving around okay.  Does have a irregular bowels but nothing too severe.  No foul color or blood.  Came to emergency room.  Given IV fluids.  Denies any abdominal pain.  Has not thrown up since he is come to the ER.  Plain films underwhelming for free air.  CT scan showing some abdominal wall hematomas and fluid collection up near the spleen.   Surgical consultation requested.   Hospital Course:  He was seen and evaluated by Dr. Johney Maine in the emergency room his operative surgeon.  He was admitted for observation and IV fluids.  It was felt that he did not have intra-abdominal or abdominal wall abscesses since the patient was not terribly ill-appearing, nonfebrile.  He was restarted on liquids and his diet was advanced over the weekend.  He had no more nausea or vomiting.  He remained afebrile without tachycardia.  His mild leukocytosis resolved without antibiotic.  He was ambulating without difficulty.  On Sunday he was tolerating a diet.  He had no pain.  He had no nausea vomiting.  He was having bowel function.  He desired discharge.  We discussed discharge instructions as well as diet  BP (!) 144/77 (BP Location: Left Arm)   Pulse 68   Temp 97.8 F (36.6 C) (Oral)   Resp 16   Wt 90.7 kg   SpO2 97%   BMI 33.27 kg/m   Gen: alert, NAD, non-toxic appearing Pupils: equal, no scleral icterus Pulm: Lungs clear to auscultation, symmetric chest rise CV: regular rate and rhythm Abd: soft, essentially tender, nondistended.  No cellulitis. No incisional hernia Ext: no edema, no calf tenderness Skin: no rash, no jaundice    Discharge Instructions  Discharge Instructions    Call MD for:   Complete by:  As directed    Temperature >101   Call MD for:  hives   Complete by:  As directed    Call MD  for:  persistant dizziness or light-headedness   Complete by:  As directed    Call MD for:  persistant nausea and vomiting   Complete by:  As directed    Call MD for:  redness, tenderness, or signs of infection (pain, swelling, redness, odor or green/yellow discharge around incision site)   Complete by:  As directed    Call MD for:  severe uncontrolled pain   Complete by:  As directed    Diet - low sodium heart healthy   Complete by:  As directed    Discharge instructions   Complete by:  As directed    See [facility] discharge instructions    Increase activity slowly   Complete by:  As directed      Allergies as of 09/29/2018   No Known Allergies     Medication List    TAKE these medications   aspirin 81 MG tablet Take 81 mg by mouth daily.   atorvastatin 10 MG tablet Commonly known as:  LIPITOR Take 10 mg by mouth at bedtime.   glipiZIDE 10 MG tablet Commonly known as:  GLUCOTROL Take 15 mg by mouth 2 (two) times daily.   levothyroxine 88 MCG tablet Commonly known as:  SYNTHROID, LEVOTHROID Take 88 mcg by mouth daily before breakfast.   metFORMIN 1000 MG tablet Commonly known as:  GLUCOPHAGE Take 1,000 mg by mouth 2 (two) times daily.   metoprolol tartrate 50 MG tablet Commonly known as:  LOPRESSOR Take 25 mg by mouth 2 (two) times daily.   nitroGLYCERIN 0.4 MG SL tablet Commonly known as:  NITROSTAT Place 1 tablet (0.4 mg total) under the tongue every 5 (five) minutes as needed for chest pain.   ondansetron 4 MG tablet Commonly known as:  ZOFRAN Take 1 tablet (4 mg total) by mouth every 8 (eight) hours as needed for nausea or vomiting.   pantoprazole 40 MG tablet Commonly known as:  PROTONIX Take 40 mg by mouth daily.   traMADol 50 MG tablet Commonly known as:  ULTRAM Take 1-2 tablets (50-100 mg total) by mouth every 6 (six) hours as needed for moderate pain or severe pain (mild pain).   vitamin B-12 1000 MCG tablet Commonly known as:  CYANOCOBALAMIN Take 1,000 mcg by mouth daily.   Vitamin D3 25 MCG (1000 UT) Caps Take 1,000 Units by mouth daily.      Follow-up Information    Michael Boston, MD Follow up.   Specialty:  General Surgery Why:  keep previously scheduled appointment Contact information: [address] [place] [address] [phone]            The results of significant diagnostics from this hospitalization (including imaging, microbiology, ancillary and laboratory) are listed below for reference.    Significant Diagnostic Studies: Ct Abdomen  Pelvis W Contrast  Result Date: 09/27/2018 CLINICAL DATA:  Vomiting. Status post surgery 8 days ago for colon cancer. Leukocytosis. EXAM: CT ABDOMEN AND PELVIS WITH CONTRAST TECHNIQUE: Multidetector CT imaging of the abdomen and pelvis was performed using the standard protocol following bolus administration of intravenous contrast. CONTRAST:  163mL ISOVUE-300 IOPAMIDOL (ISOVUE-300) INJECTION 61% COMPARISON:  Chest and abdomen radiographs obtained earlier today. Chest, abdomen and pelvis CT dated 08/15/2018. FINDINGS: Lower chest: Stable tiny subpleural nodule in the left lower lobe on image number 13 of series 7. Hepatobiliary: No focal liver abnormality is seen. Status post cholecystectomy. No biliary dilatation. Pancreas: Unremarkable. No pancreatic ductal dilatation or surrounding inflammatory changes. Spleen: Normal  in size without focal abnormality. Adrenals/Urinary Tract: Small amount of air in the urinary bladder. Normal appearing kidneys, ureters and adrenal glands. Stomach/Bowel: Proximal sigmoid colon anastomosis. Prominent stool in the colon. Normal appearing appendix. Unremarkable small bowel and stomach. Vascular/Lymphatic: Atheromatous arterial calcifications without aneurysm. No enlarged lymph nodes. Reproductive: Mildly to moderately enlarged prostate gland containing coarse calcifications. Other: Left mid to upper abdominal fluid collection laterally with adjacent soft tissue stranding in a thin surrounding enhancing rim. This has a small amount of dependent medium density material. This fluid collection measures 9.3 x 5.0 cm on image number 25 series 2 and 8.0 cm in length on coronal image number 69. This has a broad base against the inferior aspect of the spleen with flattening of that portion of the spleen. Left rectus abdominus muscle hematoma measuring 5.6 x 4.1 cm on image number 62 series 2. This measures 3.6 cm in length on sagittal image number 89. There is also a small amount of fluid  and gas between the anterior aspects of the rectus abdominus muscles, measuring 5.2 x 2.1 cm on image number 68 series 2. This measures 8.2 cm in length on sagittal image number 75. Also demonstrated is multiple locules of subcutaneous air anterior to the anterior abdominal wall. These are more focal and more numerous at the level of the right mid abdomen, with a small fluid collection containing an air-fluid level. The fluid collection measures 2.2 x 1.5 cm on image number 40 series 2. Musculoskeletal: Lumbar and lower thoracic spine degenerative changes. IMPRESSION: 1. 9.3 x 5.0 x 8.0 cm left mid to upper abdominal probable abscess. This may represent an infected inferior splenic subcapsular hematoma. 2. 5.6 x 4.1 x 2.13.6 cm left rectus abdominus muscle hematoma. 3. 8.2 x 5.2 x 2.1 cm fluid collection with a mild amount of air or gas between the anterior aspects of the rectus abdominus muscles. This could represent a postoperative seroma or developing abscess. 4. Multiple locules of subcutaneous air anterior to the anterior abdominal wall, as described above with a 2.2 cm fluid collection containing an air-fluid level on the right. This is most likely a small postoperative hematoma. An infected hematoma is possible. 5. Small amount of air in the urinary bladder. This is most likely due to recent catheterization. 6. Prominent stool in the colon. Electronically Signed   By: Claudie Revering M.D.   On: 09/27/2018 14:18   Dg Abd Acute W/chest  Result Date: 09/27/2018 CLINICAL DATA:  Vomiting.  Surgery last Thursday for colon cancer. EXAM: DG ABDOMEN ACUTE W/ 1V CHEST COMPARISON:  CT scans 08/15/2018 FINDINGS: The cardiac silhouette, mediastinal and hilar contours are within normal limits. There is mild tortuosity and calcification of the thoracic aorta. Stable surgical changes from bypass surgery and neck surgery. The lungs are clear. No infiltrates, edema or effusions. No pneumothorax. Moderate stool in the colon.  No dilated loops of small bowel to suggest obstruction. The soft tissue shadows of the abdomen are maintained. No free air. The bony structures are intact. 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 IMPRESSION: No acute cardiopulmonary findings. No plain film findings for an acute abdominal process. Electronically Signed   By: Marijo Sanes M.D.   On: 09/27/2018 12:27    Microbiology: No results found for this or any previous visit (from the past 240 hour(s)).   Labs: Basic Metabolic Panel: Recent Labs  Lab 09/27/18 1209 09/28/18 0322  NA 136 138  K 3.9 3.6  CL 97* 104  CO2  31 27  GLUCOSE 167* 119*  BUN 11 8  CREATININE 0.92 0.75  CALCIUM 8.7* 8.4*  MG 1.8  --    Liver Function Tests: Recent Labs  Lab 09/27/18 1209  AST 38  ALT 40  ALKPHOS 121  BILITOT 1.6*  PROT 6.7  ALBUMIN 3.0*   Recent Labs  Lab 09/27/18 1209  LIPASE 26   No results for input(s): AMMONIA in the last 168 hours. CBC: Recent Labs  Lab 09/27/18 1209 09/28/18 0322 09/29/18 0355  WBC 13.7* 10.9* 9.4  HGB 10.2* 9.7* 10.3*  HCT 31.7* 30.4* 33.3*  MCV 100.0 98.1 100.9*  PLT 298 314 339   Cardiac Enzymes: No results for input(s): CKTOTAL, CKMB, CKMBINDEX, TROPONINI in the last 168 hours. BNP: BNP (last 3 results) No results for input(s): BNP in the last 8760 hours.  ProBNP (last 3 results) No results for input(s): PROBNP in the last 8760 hours.  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

## 2018-10-09 ENCOUNTER — Ambulatory Visit (INDEPENDENT_AMBULATORY_CARE_PROVIDER_SITE_OTHER): Payer: Medicare Other

## 2018-10-09 ENCOUNTER — Telehealth: Payer: Self-pay | Admitting: *Deleted

## 2018-10-09 DIAGNOSIS — I493 Ventricular premature depolarization: Secondary | ICD-10-CM

## 2018-10-09 NOTE — Telephone Encounter (Signed)
Pt was here for monitor appt today and reported he was told to let us know if he had any more episodes of fast heart rate.  He reported he had episode last night that woke him up.   Monitor was applied this afternoon.

## 2018-10-25 ENCOUNTER — Telehealth: Payer: Self-pay | Admitting: *Deleted

## 2018-10-25 NOTE — Telephone Encounter (Signed)
-----   Message from Burnell Blanks, MD sent at 10/25/2018  6:29 AM EST ----- He has PVCs, PACs, short bursts of atrial tachycardia and several very short bursts of VT (longest 8 beats). I would like to increase his metoprolol to 50 mg po BID. cdm

## 2018-10-25 NOTE — Telephone Encounter (Signed)
Follow up: ° ° °Patient returning call back °

## 2018-10-25 NOTE — Telephone Encounter (Signed)
Left message to call office

## 2018-10-25 NOTE — Telephone Encounter (Signed)
If he is feeling worse, I would recommend we get him in next week to see an office APP. I am in the cath lab all week with no office time until the second week of January. If he feels worse over the weekend he should go to the ED. [name] [name]

## 2018-10-25 NOTE — Telephone Encounter (Signed)
Reviewed with Dr. Angelena Form and pt should not increase lopressor until after being seen in office and heart rate determined. I spoke with pt's wife and gave her this information.  I scheduled pt to see Almyra Deforest, PA on 10/29/18 at 10:00.  Pt's wife is aware this is at our [place] office.  She is aware pt should go to ED if he begins feeling poorly, dizzy or feeling as if he may pass out.

## 2018-10-25 NOTE — Telephone Encounter (Signed)
I spoke with pt's wife and reviewed monitor results and recommendations from Dr. Angelena Form with her.  She reports pt felt weak and looked pale the other day. Pulse ox showed heart rate was 29.  It quickly went up to the 90's and then back to 35.  Wife reports readings were "all over the place."  States readings leveled out in the 30's.  This was after pt stopped wearing monitor.  He did not feel as if he was going to pass out. Since that time he has continued to have most readings on pulse ox in the 30's.  Occasionally will jump up to 60's and 90's but not as erratic as the other day.  He feels lightheaded at times. Was feeling weak when he went for walk this morning. He has not checked his BP but does have cuff at home and wife will begin checking. I explained to pt's wife that variations in readings could be related to PVC's/PAC's but that I would send message to Dr. Angelena Form.  Pt will hold off on increasing lopressor until message reviewed by Dr. Angelena Form.

## 2018-10-29 ENCOUNTER — Encounter: Payer: Self-pay | Admitting: Physician Assistant

## 2018-10-29 ENCOUNTER — Ambulatory Visit: Payer: Medicare Other | Admitting: Physician Assistant

## 2018-10-29 VITALS — BP 124/60 | HR 82 | Ht 65.0 in | Wt 188.6 lb

## 2018-10-29 DIAGNOSIS — I10 Essential (primary) hypertension: Secondary | ICD-10-CM | POA: Diagnosis not present

## 2018-10-29 DIAGNOSIS — I25810 Atherosclerosis of coronary artery bypass graft(s) without angina pectoris: Secondary | ICD-10-CM | POA: Diagnosis not present

## 2018-10-29 DIAGNOSIS — I493 Ventricular premature depolarization: Secondary | ICD-10-CM | POA: Diagnosis not present

## 2018-10-29 DIAGNOSIS — E785 Hyperlipidemia, unspecified: Secondary | ICD-10-CM

## 2018-10-29 DIAGNOSIS — E119 Type 2 diabetes mellitus without complications: Secondary | ICD-10-CM

## 2018-10-29 DIAGNOSIS — D649 Anemia, unspecified: Secondary | ICD-10-CM | POA: Diagnosis not present

## 2018-10-29 LAB — CBC
HEMATOCRIT: 41.8 % (ref 37.5–51.0)
HEMOGLOBIN: 14 g/dL (ref 13.0–17.7)
MCH: 31.3 pg (ref 26.6–33.0)
MCHC: 33.5 g/dL (ref 31.5–35.7)
MCV: 93 fL (ref 79–97)
Platelets: 162 10*3/uL (ref 150–450)
RBC: 4.48 x10E6/uL (ref 4.14–5.80)
RDW: 12.4 % (ref 12.3–15.4)
WBC: 12.9 10*3/uL — ABNORMAL HIGH (ref 3.4–10.8)

## 2018-10-29 MED ORDER — METOPROLOL TARTRATE 25 MG PO TABS: ORAL_TABLET | ORAL | 2 refills | Status: DC

## 2018-10-29 NOTE — Patient Instructions (Signed)
Medication Instructions:  Increase metoprolol to 37.5 mg. Take 1 1/2 tablet. If you need a refill on your cardiac medications before your next appointment, please call your pharmacy.   Lab work: CBC If you have labs (blood work) drawn today and your tests are completely normal, you will receive your results only by: Marland Kitchen MyChart Message (if you have MyChart) OR . A paper copy in the mail If you have any lab test that is abnormal or we need to change your treatment, we will call you to review the results.  Follow-Up: At [facility], you and your health needs are our priority.  As part of our continuing mission to provide you with exceptional heart care, we have created designated Provider Care Teams.  These Care Teams include your primary Cardiologist (physician) and Advanced Practice Providers (APPs -  Physician Assistants and Nurse Practitioners) who all work together to provide you with the care you need, when you need it. . Follow up in 3 weeks with Dr. Angelena Form or Almyra Deforest, PA

## 2018-10-29 NOTE — Progress Notes (Signed)
Cardiology Office Note    Date:  10/31/2018   ID:  [name], [name] 1946/08/31, MRN [id number]  PCP:  Leonard Downing, MD  Cardiologist:  Dr. Angelena Form  Chief Complaint  Patient presents with  . Follow-up    seen for Dr. Angelena Form    History of Present Illness:  [name] [name] is a 72 y.o. male with PMH of CAD s/p 3v CABG 2001, HTN, HLD, DM II and PVCs.  Previous cardiac catheterization in December 2001 during which time his LAD became occluded during attempted Rotablator arthrectomy leading to three-vessel CABG.  Stress Myoview in 2016 showed no evidence of ischemia.  He underwent PVC ablation in April 2016.  His most recent stress test in August 2019 showed no ischemia.  He underwent resection of colon mass in November 2019.  Patient's last office visit with Dr. Angelena Form was on 09/27/2018, however he was having significant nausea and vomiting during the office visit.  He received IV fluid. CT scan showed abdominal wall hematomas vs abscess and fluid collection near the spleen.He was examined by general surgery, he was found not to have intra-abdominal abscess.   Patient presents today for cardiology office visit.  He remains fatigued, however no chest pain or shortness of breath.  I will obtain a CBC to follow-up on his anemia.  He recently had 7-day monitor that showed frequent PVCs, PACs, and a 8 beat run of nonsustained VT.  He was initially advised to increase metoprolol to 50 mg twice daily, however after he reported his heart rate occasionally go down to the 20s and 30s, the decision was to keep him on the current dose of 25 mg twice daily of metoprolol.  On further discussion, he occasionally has dizziness but no obvious blurred vision or feeling of passing out.  I think the more likely answer for his occasional bradycardia is his pulse ox is unable to pick up the PVC beats and was only counting every other beat.  I recommend to increase metoprolol to 37.5 mg twice daily to suppress  the frequent PVCs.  I will bring him back in 3 weeks and may consider a 24-hour Holter monitor at that time to evaluate for PVC burden.  If we cannot adequately control his PVC using medications, he may ended up needing another PVC ablation.   Past Medical History:  Diagnosis Date  . Bladder infection    10th grade  . CAD (coronary artery disease)    s/p 3V CABG 2001  . Cervical spondylosis 2002   History of  . Colon cancer ([place])   . Diabetes mellitus ([place])   . Dysphagia   . GERD (gastroesophageal reflux disease)   . HTN (hypertension)   . Hyperlipidemia   . Hypothyroidism   . LVH (left ventricular hypertrophy) 2015   Mild  . Myocardial infarction ([place])    x2  . PVC (premature ventricular contraction)    a. s/p ablation  . Tobacco abuse, in remission     Past Surgical History:  Procedure Laterality Date  . ANTERIOR CERVICAL DISCECTOMY  03/20/2001   Many levels.  Dr Christella Noa  . Bone spur removal right shoulder    . CHOLECYSTECTOMY    . CORONARY ARTERY BYPASS GRAFT  10/23/2000   x3 Dr Servando Snare  . FACIAL FRACTURE SURGERY     after being hit with baseball  . MOUTH SURGERY    . Pineal cyst removal    . PROCTOSCOPY N/A 09/19/2018   Procedure: RIGID  PROCTOSCOPY;  Surgeon: Michael Boston, MD;  Location: [facility];  Service: General;  Laterality: N/A;  . V-TACH ABLATION N/A 02/15/2015   PVC ablation by Dr Lovena Le    Current Medications: Outpatient Medications Prior to Visit  Medication Sig Dispense Refill  . aspirin 81 MG tablet Take 81 mg by mouth daily.    Marland Kitchen atorvastatin (LIPITOR) 10 MG tablet Take 10 mg by mouth at bedtime.   0  . Cholecalciferol (VITAMIN D3) 1000 units CAPS Take 1,000 Units by mouth daily.    Marland Kitchen glipiZIDE (GLUCOTROL) 10 MG tablet Take 15 mg by mouth 2 (two) times daily.  1  . levothyroxine (SYNTHROID, LEVOTHROID) 88 MCG tablet Take 88 mcg by mouth daily before breakfast.   0  . metFORMIN (GLUCOPHAGE) 1000 MG tablet Take 1,000 mg by mouth 2 (two) times daily.   3  . pantoprazole (PROTONIX) 40 MG tablet Take 40 mg by mouth daily.  0  . vitamin B-12 (CYANOCOBALAMIN) 1000 MCG tablet Take 1,000 mcg by mouth daily.     . metoprolol (LOPRESSOR) 50 MG tablet Take 25 mg by mouth 2 (two) times daily.   0  . ondansetron (ZOFRAN) 4 MG tablet Take 1 tablet (4 mg total) by mouth every 8 (eight) hours as needed for nausea or vomiting. 20 tablet 0  . traMADol (ULTRAM) 50 MG tablet Take 1-2 tablets (50-100 mg total) by mouth every 6 (six) hours as needed for moderate pain or severe pain (mild pain). 20 tablet 0  . nitroGLYCERIN (NITROSTAT) 0.4 MG SL tablet Place 1 tablet (0.4 mg total) under the tongue every 5 (five) minutes as needed for chest pain. (Patient not taking: Reported on 10/29/2018) 25 tablet 6   No facility-administered medications prior to visit.      Allergies:   Patient has no known allergies.   Social History   Socioeconomic History  . Marital status: Married    Spouse name: Not on file  . Number of children: 4  . Years of education: Not on file  . Highest education level: Not on file  Occupational History  . Occupation: Retired Investment banker, operational  . Financial resource strain: Not on file  . Food insecurity:    Worry: Not on file    Inability: Not on file  . Transportation needs:    Medical: Not on file    Non-medical: Not on file  Tobacco Use  . Smoking status: Former Smoker    Packs/day: 3.00    Years: 35.00    Pack years: 105.00    Types: Cigarettes    Last attempt to quit: 09/16/2000    Years since quitting: 18.1  . Smokeless tobacco: Never Used  Substance and Sexual Activity  . Alcohol use: No  . Drug use: No  . Sexual activity: Not on file  Lifestyle  . Physical activity:    Days per week: Not on file    Minutes per session: Not on file  . Stress: Not on file  Relationships  . Social connections:    Talks on phone: Not on file    Gets together: Not on file    Attends religious service: Not on file     Active member of club or organization: Not on file    Attends meetings of clubs or organizations: Not on file    Relationship status: Not on file  Other Topics Concern  . Not on file  Social History Narrative  . Not on file  Family History:  The patient's family history includes Diabetes in his father and mother; Heart attack (age of onset: 40) in his father; Heart attack (age of onset: 36) in his paternal grandfather.   ROS:   Please see the history of present illness.    ROS All other systems reviewed and are negative.   PHYSICAL EXAM:   VS:  BP 124/60   Pulse 82   Ht 5\' 5"  (1.651 m)   Wt 188 lb 9.6 oz (85.5 kg)   BMI 31.38 kg/m    GEN: Well nourished, well developed, in no acute distress  HEENT: normal  Neck: no JVD, carotid bruits, or masses Cardiac: RRR; no murmurs, rubs, or gallops,no edema  Respiratory:  clear to auscultation bilaterally, normal work of breathing GI: soft, nontender, nondistended, + BS MS: no deformity or atrophy  Skin: warm and dry, no rash Neuro:  Alert and Oriented x 3, Strength and sensation are intact Psych: euthymic mood, full affect  Wt Readings from Last 3 Encounters:  10/29/18 188 lb 9.6 oz (85.5 kg)  09/29/18 199 lb 15.3 oz (90.7 kg)  09/27/18 200 lb 9.6 oz (91 kg)      Studies/Labs Reviewed:   EKG:  EKG is ordered today.  The ekg ordered today demonstrates bigeminy HR 82  Recent Labs: 09/27/2018: ALT 40; Magnesium 1.8 09/28/2018: BUN 8; Creatinine, Ser 0.75; Potassium 3.6; Sodium 138 10/29/2018: Hemoglobin 14.0; Platelets 162   Lipid Panel No results found for: CHOL, TRIG, HDL, CHOLHDL, VLDL, LDLCALC, LDLDIRECT  Additional studies/ records that were reviewed today include:   Event monitor 10/09/2018 Study Highlights   Sinus rhythm Premature ventricular contractions Premature atrial contractions Several short runs of atrial tachycardia Several short runs of ventricular tachycardia (longest 8 beats).         ASSESSMENT:    1. PVC's (premature ventricular contractions)   2. Anemia, unspecified type   3. Coronary artery disease involving coronary bypass graft of native heart without angina pectoris   4. Essential hypertension   5. Hyperlipidemia, unspecified hyperlipidemia type   6. Controlled type 2 diabetes mellitus without complication, without long-term current use of insulin (HCC)      PLAN:  In order of problems listed above:  1. Frequent PVCs: EKG demonstrated bigeminy.  I suspect that his recent bradycardia was due to frequent PVCs that was not captured on the home blood pressure cuff.  Therefore, I suspect his heart rate was occasionally falsely low and he does not have true bradycardia.  In this case, I recommend increase metoprolol to 37.5 mg twice daily to further suppress PVC.  May consider Holter monitor on follow-up to assess the degree of PVCs.  If unable to be medically controlled, may need to consider refer to EP for further work-up  2. Anemia: Obtain CBC  3. CAD: Denies any recent chest pain  4. Hypertension: Blood pressure stable on current therapy  5. Hyperlipidemia: On Lipitor 10 mg daily  6. DM2: Followed by primary care provider    Medication Adjustments/Labs and Tests Ordered: Current medicines are reviewed at length with the patient today.  Concerns regarding medicines are outlined above.  Medication changes, Labs and Tests ordered today are listed in the Patient Instructions below. Patient Instructions  Medication Instructions:  Increase metoprolol to 37.5 mg. Take 1 1/2 tablet. If you need a refill on your cardiac medications before your next appointment, please call your pharmacy.   Lab work: CBC If you have labs (blood work) drawn today  and your tests are completely normal, you will receive your results only by: Marland Kitchen MyChart Message (if you have MyChart) OR . A paper copy in the mail If you have any lab test that is abnormal or we need to  change your treatment, we will call you to review the results.  Follow-Up: At [facility], you and your health needs are our priority.  As part of our continuing mission to provide you with exceptional heart care, we have created designated Provider Care Teams.  These Care Teams include your primary Cardiologist (physician) and Advanced Practice Providers (APPs -  Physician Assistants and Nurse Practitioners) who all work together to provide you with the care you need, when you need it. . Follow up in 3 weeks with Dr. Angelena Form or Almyra Deforest, [name]       Signed, [place], [place]  10/31/2018 11:51 PM    [facility] [address], [place], [place]  [address] Phone: [phone]; Fax: [phone]

## 2018-10-31 ENCOUNTER — Encounter: Payer: Self-pay | Admitting: Physician Assistant

## 2018-11-11 ENCOUNTER — Ambulatory Visit: Payer: Self-pay | Admitting: Surgery

## 2018-11-21 ENCOUNTER — Ambulatory Visit: Payer: Medicare Other | Admitting: Physician Assistant

## 2018-11-21 ENCOUNTER — Encounter: Payer: Self-pay | Admitting: Physician Assistant

## 2018-11-21 VITALS — BP 124/58 | HR 55 | Ht 65.0 in | Wt 188.4 lb

## 2018-11-21 DIAGNOSIS — I493 Ventricular premature depolarization: Secondary | ICD-10-CM

## 2018-11-21 DIAGNOSIS — E119 Type 2 diabetes mellitus without complications: Secondary | ICD-10-CM

## 2018-11-21 DIAGNOSIS — R001 Bradycardia, unspecified: Secondary | ICD-10-CM

## 2018-11-21 DIAGNOSIS — I10 Essential (primary) hypertension: Secondary | ICD-10-CM | POA: Diagnosis not present

## 2018-11-21 DIAGNOSIS — E785 Hyperlipidemia, unspecified: Secondary | ICD-10-CM

## 2018-11-21 DIAGNOSIS — I25810 Atherosclerosis of coronary artery bypass graft(s) without angina pectoris: Secondary | ICD-10-CM

## 2018-11-21 NOTE — Progress Notes (Signed)
Cardiology Office Note    Date:  11/23/2018   ID:  Tyon, Cerasoli [birth year], MRN [id number]  PCP:  Leonard Downing, MD  Cardiologist:  Dr. Angelena Form   Chief Complaint  Patient presents with  . Follow-up    seen for Dr. Angelena Form    History of Present Illness:  [name] [name] is a 73 y.o. male with PMH of CAD s/p 3v CABG 2001, HTN, HLD, DM II and PVCs.  Previous cardiac catheterization in December 2001 during which time his LAD became occluded during attempted Rotablator arthrectomy leading to three-vessel CABG.  Stress Myoview in 2016 showed no evidence of ischemia.  He underwent PVC ablation in April 2016.  His most recent stress test in August 2019 showed no ischemia.  He underwent resection of colon mass in November 2019.  Patient's last office visit with Dr. Angelena Form was on 09/27/2018, however he was having significant nausea and vomiting during the office visit.  He received IV fluid. CT scan showed abdominal wall hematomas vs abscess and fluid collection near the spleen. He was examined by general surgery, he was found not to have intra-abdominal abscess.   I last saw the patient on 10/29/2018, he did not have any chest pain or shortness of breath at the time.  He did wear a 7-day heart monitor that showed frequent PVCs, PACs and 8 beats run of nonsustained VT.  He was initially advised to increase metoprolol to 50 mg twice daily, however however after he reported his heart rate occasionally go down to the 20s and 30s, the decision was to keep him on the 25 mg twice daily of metoprolol.  I felt his bradycardia was likely due to the fact that his pulse ox was unable to pick up the PVCs and was only counting every other beat instead.  I recommended for him to increase metoprolol to 37.5 mg twice daily to help suppress the PVCs.  Patient presents today for follow-up.  I listen to his heart for a minute and a half, and caught only 3 PVCs.  The episodes of PVCs has significantly  decreased.  He denies any dizziness on the current medication that has seems to be tolerating the increased dose of metoprolol well.  He did have an episode last night after he came back from the bathroom, he said he felt drained and he was pouring sweat however denies any chest pain or dizziness at the time.  He said he felt like his blood sugar was low, after eating some food, his symptoms resolved.  I will arrange another 48-hour monitor to assess his heart rate and the frequency of the PVCs.  I expect the number of PVCs has decreased considerably compared to before.  If my suspicion is right, I think his recent bradycardia was falsely low due to the inability of the pulse ox to pick up the PVCs.  He still notices occasional heart rate down to the 30s, but I suspect this was in the setting of frequent PVC.   Past Medical History:  Diagnosis Date  . Bladder infection    10th grade  . CAD (coronary artery disease)    s/p 3V CABG 2001  . Cervical spondylosis 2002   History of  . Colon cancer ([place])   . Diabetes mellitus ([place])   . Dysphagia   . GERD (gastroesophageal reflux disease)   . HTN (hypertension)   . Hyperlipidemia   . Hypothyroidism   . LVH (left ventricular hypertrophy)  2015   Mild  . Myocardial infarction ([place])    x2  . PVC (premature ventricular contraction)    a. s/p ablation  . Tobacco abuse, in remission     Past Surgical History:  Procedure Laterality Date  . ANTERIOR CERVICAL DISCECTOMY  03/20/2001   Many levels.  Dr Christella Noa  . Bone spur removal right shoulder    . CHOLECYSTECTOMY    . CORONARY ARTERY BYPASS GRAFT  10/23/2000   x3 Dr Servando Snare  . FACIAL FRACTURE SURGERY     after being hit with baseball  . MOUTH SURGERY    . Pineal cyst removal    . PROCTOSCOPY N/A 09/19/2018   Procedure: RIGID PROCTOSCOPY;  Surgeon: Michael Boston, MD;  Location: [facility];  Service: General;  Laterality: N/A;  . V-TACH ABLATION N/A 02/15/2015   PVC ablation by Dr Lovena Le     Current Medications: Outpatient Medications Prior to Visit  Medication Sig Dispense Refill  . aspirin 81 MG tablet Take 81 mg by mouth daily.    Marland Kitchen atorvastatin (LIPITOR) 10 MG tablet Take 10 mg by mouth at bedtime.   0  . Cholecalciferol (VITAMIN D3) 1000 units CAPS Take 1,000 Units by mouth daily.    Marland Kitchen glipiZIDE (GLUCOTROL) 10 MG tablet Take 15 mg by mouth 2 (two) times daily.  1  . levothyroxine (SYNTHROID, LEVOTHROID) 88 MCG tablet Take 88 mcg by mouth daily before breakfast.   0  . metFORMIN (GLUCOPHAGE) 1000 MG tablet Take 1,000 mg by mouth 2 (two) times daily.  3  . metoprolol tartrate (LOPRESSOR) 25 MG tablet Take 1 1/2 tablet (37.5 mg) twice daily. 90 tablet 2  . nitroGLYCERIN (NITROSTAT) 0.4 MG SL tablet Place 1 tablet (0.4 mg total) under the tongue every 5 (five) minutes as needed for chest pain. 25 tablet 6  . pantoprazole (PROTONIX) 40 MG tablet Take 40 mg by mouth daily.  0  . vitamin B-12 (CYANOCOBALAMIN) 1000 MCG tablet Take 1,000 mcg by mouth daily.      No facility-administered medications prior to visit.      Allergies:   Patient has no known allergies.   Social History   Socioeconomic History  . Marital status: Married    Spouse name: Not on file  . Number of children: 4  . Years of education: Not on file  . Highest education level: Not on file  Occupational History  . Occupation: Retired Investment banker, operational  . Financial resource strain: Not on file  . Food insecurity:    Worry: Not on file    Inability: Not on file  . Transportation needs:    Medical: Not on file    Non-medical: Not on file  Tobacco Use  . Smoking status: Former Smoker    Packs/day: 3.00    Years: 35.00    Pack years: 105.00    Types: Cigarettes    Last attempt to quit: 09/16/2000    Years since quitting: 18.1  . Smokeless tobacco: Never Used  Substance and Sexual Activity  . Alcohol use: No  . Drug use: No  . Sexual activity: Not on file  Lifestyle  . Physical  activity:    Days per week: Not on file    Minutes per session: Not on file  . Stress: Not on file  Relationships  . Social connections:    Talks on phone: Not on file    Gets together: Not on file    Attends religious service: Not  on file    Active member of club or organization: Not on file    Attends meetings of clubs or organizations: Not on file    Relationship status: Not on file  Other Topics Concern  . Not on file  Social History Narrative  . Not on file     Family History:  The patient's family history includes Diabetes in his father and mother; Heart attack (age of onset: 64) in his father; Heart attack (age of onset: 37) in his paternal grandfather.   ROS:   Please see the history of present illness.    ROS All other systems reviewed and are negative.   PHYSICAL EXAM:   VS:  BP (!) 124/58   Pulse (!) 55   Ht 5\' 5"  (1.651 m)   Wt 188 lb 6.4 oz (85.5 kg)   BMI 31.35 kg/m    GEN: Well nourished, well developed, in no acute distress  HEENT: normal  Neck: no JVD, carotid bruits, or masses Cardiac: RRR; no murmurs, rubs, or gallops,no edema  Respiratory:  clear to auscultation bilaterally, normal work of breathing GI: soft, nontender, nondistended, + BS MS: no deformity or atrophy  Skin: warm and dry, no rash Neuro:  Alert and Oriented x 3, Strength and sensation are intact Psych: euthymic mood, full affect  Wt Readings from Last 3 Encounters:  11/21/18 188 lb 6.4 oz (85.5 kg)  10/29/18 188 lb 9.6 oz (85.5 kg)  09/29/18 199 lb 15.3 oz (90.7 kg)      Studies/Labs Reviewed:   EKG:  EKG is not ordered today.    Recent Labs: 09/27/2018: ALT 40; Magnesium 1.8 09/28/2018: BUN 8; Creatinine, Ser 0.75; Potassium 3.6; Sodium 138 10/29/2018: Hemoglobin 14.0; Platelets 162   Lipid Panel No results found for: CHOL, TRIG, HDL, CHOLHDL, VLDL, LDLCALC, LDLDIRECT  Additional studies/ records that were reviewed today include:    Myoview 06/27/2018 Study Highlights     Moderate area of decreased tracer activity in the inferior, inferolateral (base/mid) and distal anteroseptal/apical walls consistent with probable soft tissue attenuation (diaphram, small bowel and subcutaneous fat). Cannot rule out subendocardial scar No ischemia  This is a low risk study.  No change from previous study      Monitor 10/09/2018 Study Highlights   Sinus rhythm Premature ventricular contractions Premature atrial contractions Several short runs of atrial tachycardia Several short runs of ventricular tachycardia (longest 8 beats).      ASSESSMENT:    1. Bradycardia   2. Frequent PVCs   3. Coronary artery disease involving coronary bypass graft of native heart without angina pectoris   4. Essential hypertension   5. Hyperlipidemia LDL goal <70   6. Controlled type 2 diabetes mellitus without complication, without long-term current use of insulin (HCC)      PLAN:  In order of problems listed above:  1. Bradycardia: I think his recent bradycardia was related to the inability of pulse ox to pick up the PVCs.  I increased his beta-blocker to 37.5 mg twice daily to help control the PVCs.  Yesterday, he did have an episode where he was pouring sweat and also had feel very drained, however this could be related to low blood sugar.  He ate for some food and the symptom went away.  I will obtain a repeat 48-hour monitor, I suspect the number of PVCs has went down considerably.  I will make sure there is no significant bradycardia.  2. CAD: Status post CABG: Myoview obtained the August  2019 was low risk.  He denies any significant chest discomfort recently  3. Hypertension: Blood pressure stable on current therapy  4. Hyperlipidemia: Continue statin, Lipitor 10 mg daily.  5. DM2: Followed by primary care provider.    Medication Adjustments/Labs and Tests Ordered: Current medicines are reviewed at length with the patient today.  Concerns regarding medicines are  outlined above.  Medication changes, Labs and Tests ordered today are listed in the Patient Instructions below. Patient Instructions  Medication Instructions:  Your physician recommends that you continue on your current medications as directed. Please refer to the Current Medication list given to you today.  If you need a refill on your cardiac medications before your next appointment, please call your pharmacy.   Lab work: None ordered If you have labs (blood work) drawn today and your tests are completely normal, you will receive your results only by: Marland Kitchen MyChart Message (if you have MyChart) OR . A paper copy in the mail If you have any lab test that is abnormal or we need to change your treatment, we will call you to review the results.  Testing/Procedures: Your physician has recommended that you wear a holter monitor. Holter monitors are medical devices that record the heart's electrical activity. Doctors most often use these monitors to diagnose arrhythmias. Arrhythmias are problems with the speed or rhythm of the heartbeat. The monitor is a small, portable device. You can wear one while you do your normal daily activities. This is usually used to diagnose what is causing palpitations/syncope (passing out).    Follow-Up: At [facility], you and your health needs are our priority.  As part of our continuing mission to provide you with exceptional heart care, we have created designated Provider Care Teams.  These Care Teams include your primary Cardiologist (physician) and Advanced Practice Providers (APPs -  Physician Assistants and Nurse Practitioners) who all work together to provide you with the care you need, when you need it.  Follow up as planned with Dr.Mcalhany on 12/30/18 @ 1:40pm     Signed, Almyra Deforest, [place]  11/23/2018 11:37 PM    [facility] [place], [place], [place]  [address] Phone: [phone]; Fax: [phone]

## 2018-11-21 NOTE — Patient Instructions (Addendum)
Medication Instructions:  Your physician recommends that you continue on your current medications as directed. Please refer to the Current Medication list given to you today.  If you need a refill on your cardiac medications before your next appointment, please call your pharmacy.   Lab work: None ordered If you have labs (blood work) drawn today and your tests are completely normal, you will receive your results only by: Marland Kitchen MyChart Message (if you have MyChart) OR . A paper copy in the mail If you have any lab test that is abnormal or we need to change your treatment, we will call you to review the results.  Testing/Procedures: Your physician has recommended that you wear a holter monitor. Holter monitors are medical devices that record the heart's electrical activity. Doctors most often use these monitors to diagnose arrhythmias. Arrhythmias are problems with the speed or rhythm of the heartbeat. The monitor is a small, portable device. You can wear one while you do your normal daily activities. This is usually used to diagnose what is causing palpitations/syncope (passing out).    Follow-Up: At [facility], you and your health needs are our priority.  As part of our continuing mission to provide you with exceptional heart care, we have created designated Provider Care Teams.  These Care Teams include your primary Cardiologist (physician) and Advanced Practice Providers (APPs -  Physician Assistants and Nurse Practitioners) who all work together to provide you with the care you need, when you need it.  Follow up as planned with Dr.Mcalhany on 12/30/18 @ 1:40pm

## 2018-11-23 ENCOUNTER — Encounter: Payer: Self-pay | Admitting: Physician Assistant

## 2018-12-04 ENCOUNTER — Ambulatory Visit (INDEPENDENT_AMBULATORY_CARE_PROVIDER_SITE_OTHER): Payer: Medicare Other

## 2018-12-04 DIAGNOSIS — I493 Ventricular premature depolarization: Secondary | ICD-10-CM | POA: Diagnosis not present

## 2018-12-04 DIAGNOSIS — R001 Bradycardia, unspecified: Secondary | ICD-10-CM | POA: Diagnosis not present

## 2018-12-06 ENCOUNTER — Telehealth: Payer: Self-pay

## 2018-12-06 ENCOUNTER — Telehealth: Payer: Self-pay | Admitting: Oncology

## 2018-12-06 ENCOUNTER — Encounter: Payer: Self-pay | Admitting: Oncology

## 2018-12-06 NOTE — Telephone Encounter (Signed)
A new patient appt has been scheduled for the pt to see Dr. Benay Spice on 2/25 at 2pm. Letter mailed.

## 2018-12-06 NOTE — Telephone Encounter (Signed)
  Oncology Nurse Navigator Documentation    Called patient and left AM to advise of initial consult with Dr. Benay Spice on 12/31/18 @ 2 PM. Instructed to arrive 15 minutes early to register. I also provided my contact information for questions or concerns.

## 2018-12-12 ENCOUNTER — Telehealth: Payer: Self-pay | Admitting: *Deleted

## 2018-12-12 NOTE — Telephone Encounter (Signed)
-----   Message from Burnell Blanks, MD sent at 12/11/2018  1:20 PM EST ----- Still having PVCs and PACs. I would recommend that he continue the beta blocker. No other recs. Gerald Stabs

## 2018-12-12 NOTE — Telephone Encounter (Signed)
Left message to call office

## 2018-12-12 NOTE — Telephone Encounter (Signed)
New Message   Patient's wife returning Pat's phone call

## 2018-12-12 NOTE — Telephone Encounter (Signed)
I spoke with pt's wife and reviewed monitor results with her 

## 2018-12-30 ENCOUNTER — Ambulatory Visit: Payer: Medicare Other | Admitting: Cardiovascular Disease

## 2018-12-30 ENCOUNTER — Encounter: Payer: Self-pay | Admitting: Cardiovascular Disease

## 2018-12-30 VITALS — BP 120/62 | HR 54 | Ht 65.0 in | Wt 193.0 lb

## 2018-12-30 DIAGNOSIS — E785 Hyperlipidemia, unspecified: Secondary | ICD-10-CM | POA: Diagnosis not present

## 2018-12-30 DIAGNOSIS — I10 Essential (primary) hypertension: Secondary | ICD-10-CM | POA: Diagnosis not present

## 2018-12-30 DIAGNOSIS — I493 Ventricular premature depolarization: Secondary | ICD-10-CM

## 2018-12-30 DIAGNOSIS — I25810 Atherosclerosis of coronary artery bypass graft(s) without angina pectoris: Secondary | ICD-10-CM | POA: Diagnosis not present
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
## 2018-12-30 NOTE — Patient Instructions (Signed)

## 2018-12-31 ENCOUNTER — Inpatient Hospital Stay: Payer: Medicare Other | Attending: Oncology | Admitting: Oncology

## 2018-12-31 VITALS — BP 119/70 | HR 54 | Temp 97.7°F | Resp 19 | Ht 65.0 in | Wt 193.9 lb

## 2018-12-31 DIAGNOSIS — E039 Hypothyroidism, unspecified: Secondary | ICD-10-CM | POA: Diagnosis not present

## 2018-12-31 DIAGNOSIS — I2510 Atherosclerotic heart disease of native coronary artery without angina pectoris: Secondary | ICD-10-CM

## 2018-12-31 DIAGNOSIS — Z9049 Acquired absence of other specified parts of digestive tract: Secondary | ICD-10-CM

## 2018-12-31 DIAGNOSIS — C186 Malignant neoplasm of descending colon: Secondary | ICD-10-CM

## 2018-12-31 DIAGNOSIS — R918 Other nonspecific abnormal finding of lung field: Secondary | ICD-10-CM
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
## 2018-12-31 NOTE — Progress Notes (Signed)
Checks glucose every am: ranges 90-130

## 2019-01-19 ENCOUNTER — Other Ambulatory Visit: Payer: Self-pay | Admitting: Physician Assistant

## 2019-05-18 IMAGING — CT CT ABD-PELV W/ CM
2 of 5 series · 15 of 46 positions shown, 17 images · IV contrast (ISOVUE)
Comparison: Chest and abdomen radiographs obtained earlier today.
Chest, abdomen and pelvis CT dated 08/15/2018.

CLINICAL DATA: Vomiting. Status post surgery 8 days ago for colon
cancer. Leukocytosis.

EXAM:
CT ABDOMEN AND PELVIS WITH CONTRAST
TECHNIQUE: Multidetector CT imaging of the abdomen and pelvis was performed
using the standard protocol following bolus administration of
intravenous contrast.
CONTRAST:  100mL [id number] IOPAMIDOL ([id number]) INJECTION 61%

[Series 2: axial st · axial · 0.95mm/px · z∈[-501,-81]mm · 12 of 98 slices shown, 14 images]
[im 7/98  soft-tissue]
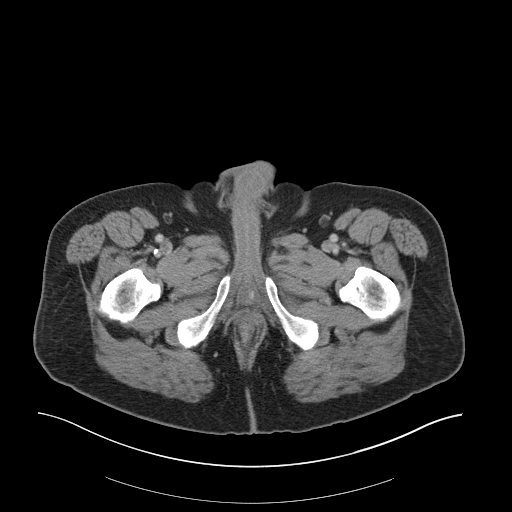
[im 7/98  bone]
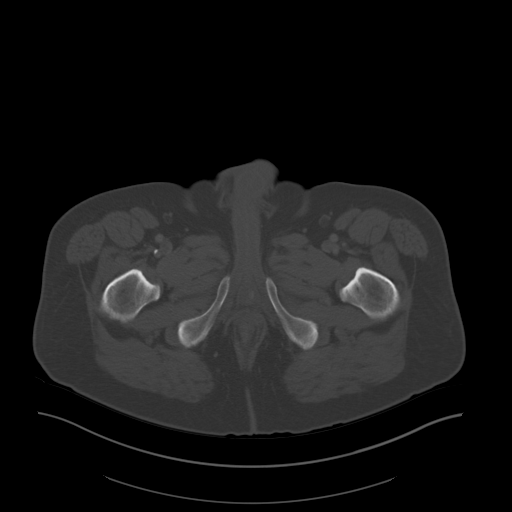
[im 14/98  soft-tissue]
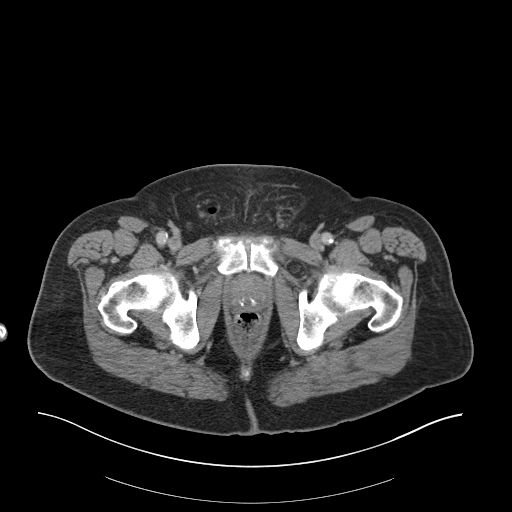
[im 21/98  soft-tissue]
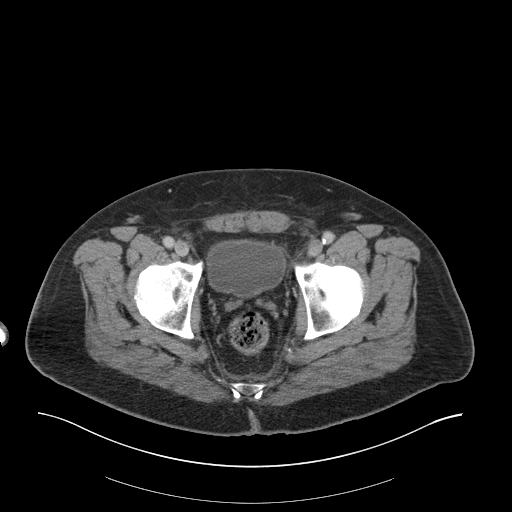
[im 28/98  soft-tissue]
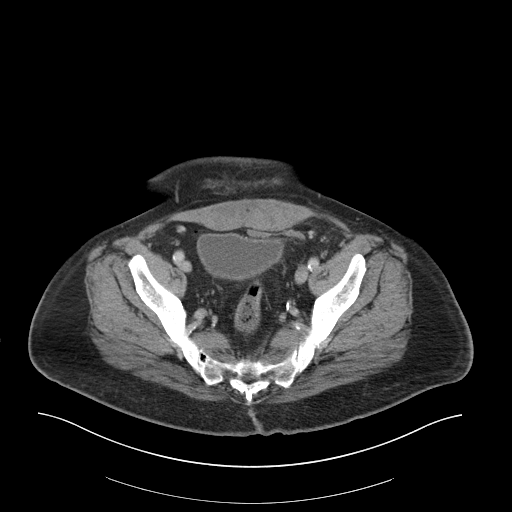
[im 35/98  soft-tissue]
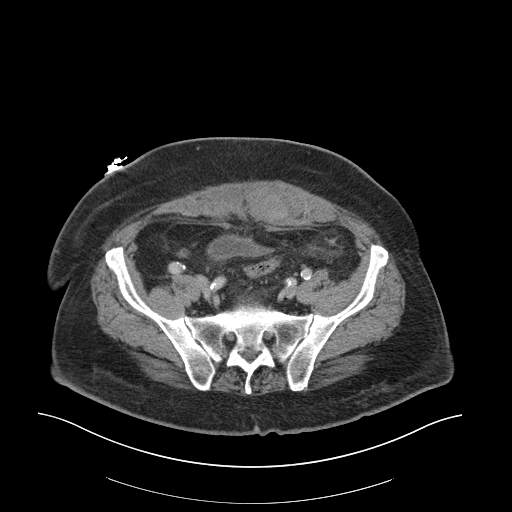
[im 42/98  soft-tissue]
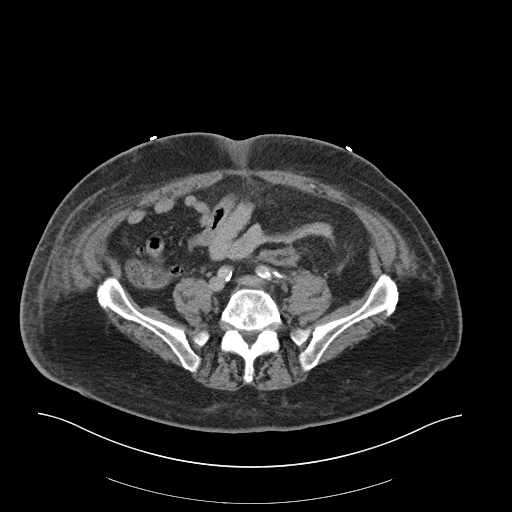
[im 56/98  soft-tissue]
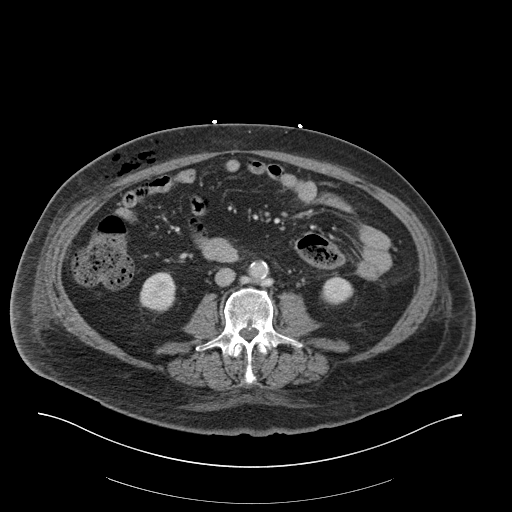
[im 63/98  soft-tissue]
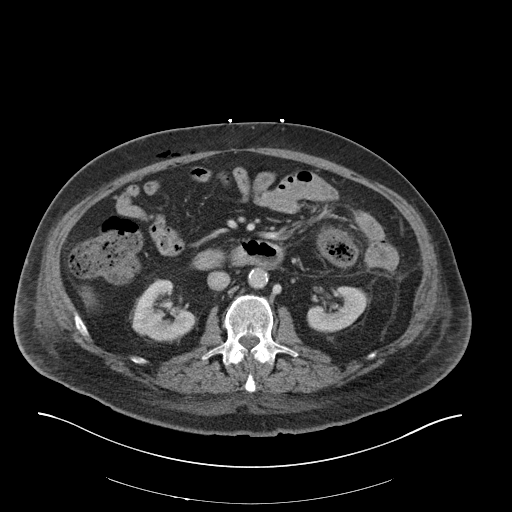
[im 70/98  soft-tissue]
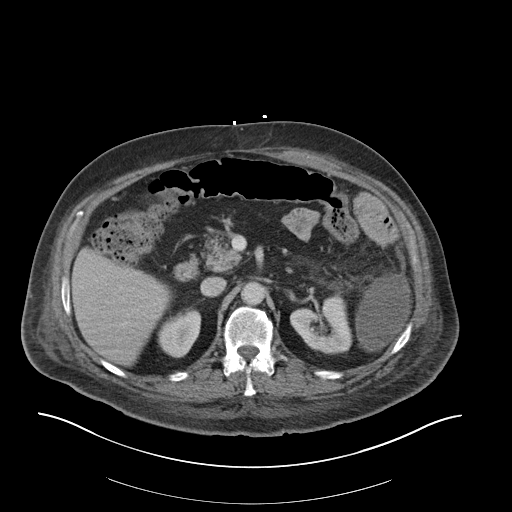
[im 70/98  bone]
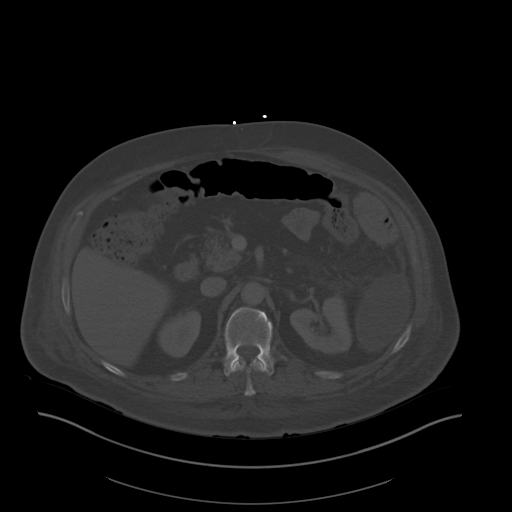
[im 77/98  soft-tissue]
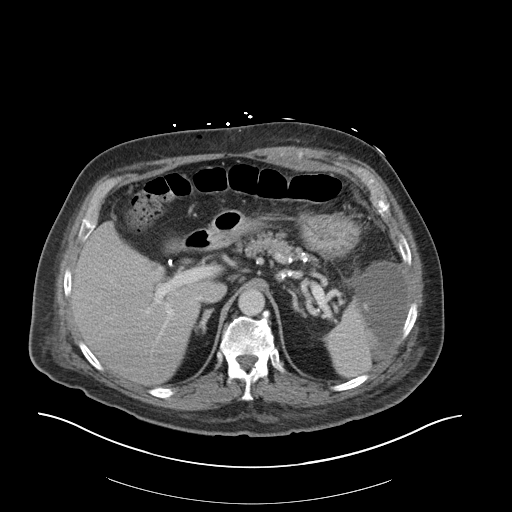
[im 84/98  soft-tissue]
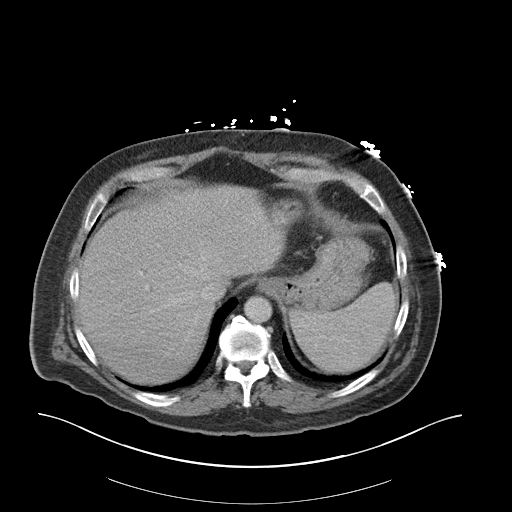
[im 91/98  soft-tissue]
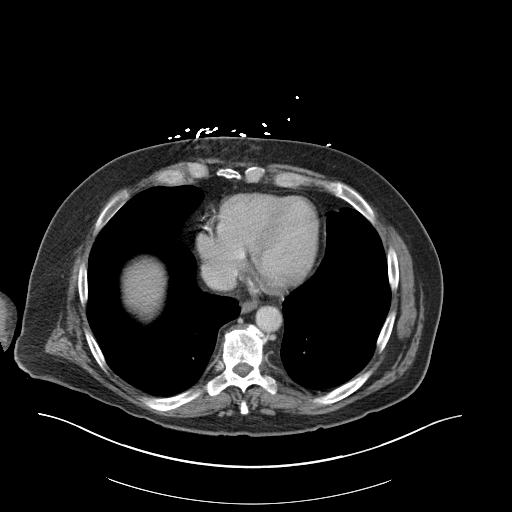

[Series 5: coronal st · coronal · 0.94mm/px · 3 of 110 slices shown]
[im 37/110  soft-tissue]
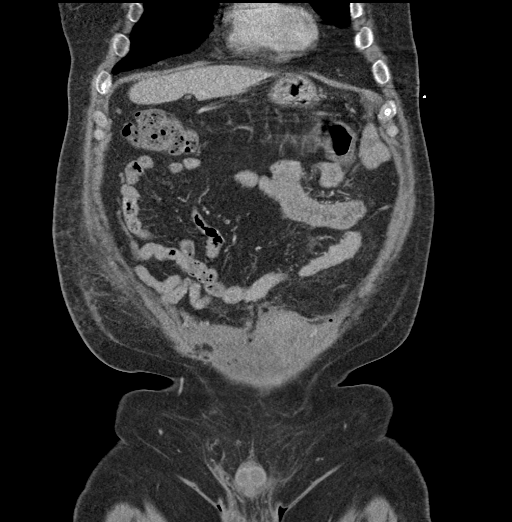
[im 49/110  soft-tissue]
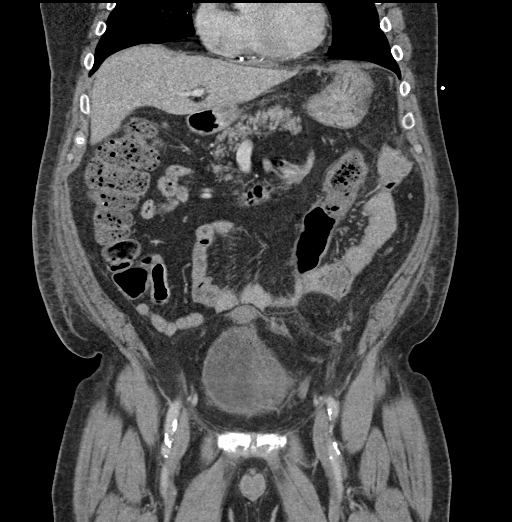
[im 61/110  soft-tissue]
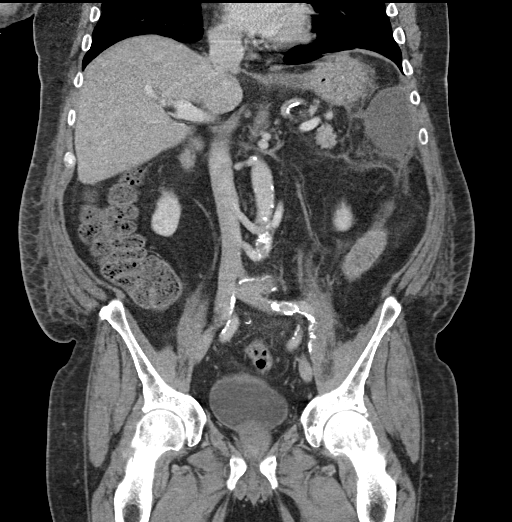

[15 of 46 positions shown; findings below may reference images not displayed]

FINDINGS: Lower chest: Stable tiny subpleural nodule in the left lower lobe on
image number 13 of series 7.

Hepatobiliary: No focal liver abnormality is seen. Status post
cholecystectomy. No biliary dilatation.

Pancreas: Unremarkable. No pancreatic ductal dilatation or
surrounding inflammatory changes.

Spleen: Normal in size without focal abnormality.

Adrenals/Urinary Tract: Small amount of air in the urinary bladder.
Normal appearing kidneys, ureters and adrenal glands.

Stomach/Bowel: Proximal sigmoid colon anastomosis. Prominent stool
in the colon. Normal appearing appendix. Unremarkable small bowel
and stomach.

Vascular/Lymphatic: Atheromatous arterial calcifications without
aneurysm. No enlarged lymph nodes.

Reproductive: Mildly to moderately enlarged prostate gland
containing coarse calcifications.

Other: Left mid to upper abdominal fluid collection laterally with
adjacent soft tissue stranding in a thin surrounding enhancing rim.
This has a small amount of dependent medium density material. This
fluid collection measures 9.3 x 5.0 cm on image number 25 series 2
and 8.0 cm in length on coronal image number 69. This has a broad
base against the inferior aspect of the spleen with flattening of
that portion of the spleen.

Left rectus abdominus muscle hematoma measuring 5.6 x 4.1 cm on
image number 62 series 2. This measures 3.6 cm in length on sagittal
image number 89.

There is also a small amount of fluid and gas between the anterior
aspects of the rectus abdominus muscles, measuring 5.2 x 2.1 cm on
image number 68 series 2. This measures 8.2 cm in length on sagittal
image number 75.

Also demonstrated is multiple locules of subcutaneous air anterior
to the anterior abdominal wall. These are more focal and more
numerous at the level of the right mid abdomen, with a small fluid
collection containing an air-fluid level. The fluid collection
measures 2.2 x 1.5 cm on image number 40 series 2.

Musculoskeletal: Lumbar and lower thoracic spine degenerative
changes.
IMPRESSION: 1. 9.3 x 5.0 x 8.0 cm left mid to upper abdominal probable abscess.
This may represent an infected inferior splenic subcapsular
hematoma.
2. 5.6 x 4.1 x 2.13.6 cm left rectus abdominus muscle hematoma.
3. 8.2 x 5.2 x 2.1 cm fluid collection with a mild amount of air or
gas between the anterior aspects of the rectus abdominus muscles.
This could represent a postoperative seroma or developing abscess.
4. Multiple locules of subcutaneous air anterior to the anterior
abdominal wall, as described above with a 2.2 cm fluid collection
containing an air-fluid level on the right. This is most likely a
small postoperative hematoma. An infected hematoma is possible.
5. Small amount of air in the urinary bladder. This is most likely
due to recent catheterization.
6. Prominent stool in the colon.

## 2019-12-16 NOTE — Progress Notes (Signed)
Chief Complaint  Patient presents with  . Follow-up    CAD    History of Present Illness: 74 yo male with history of CAD s/p 3V CABG in 2001, HTN, DM, PVCs and HLD who is here today for cardiac follow up. His last cath was in December 2001 at which time his LAD shut down during attempted rotablator atherectomy leading to 3V CABG. Stress myoview in 2016 with no evidence of ischemia. He has undergone PVC ablation in April of 2016. Most recent stress test in August 2019 with no ischemia. Resection of colon mass on 09/19/18. He has been told this was cancer. Cardiac monitor December 2019 with PVCs, PACs and 8 beat run NSVT. His beta blocker was increased.   He is here today for follow up. The patient denies any chest pain, dyspnea, palpitations, lower extremity edema, orthopnea, PND, dizziness, near syncope or syncope.   Primary Care Physician: Leonard Downing, MD  Past Medical History:  Diagnosis Date  . Bladder infection    10th grade  . CAD (coronary artery disease)    s/p 3V CABG 2001  . Cervical spondylosis 2002   History of  . Colon cancer ([place])   . Diabetes mellitus ([place])   . Dysphagia   . GERD (gastroesophageal reflux disease)   . HTN (hypertension)   . Hyperlipidemia   . Hypothyroidism   . LVH (left ventricular hypertrophy) 2015   Mild  . Myocardial infarction ([place])    x2  . PVC (premature ventricular contraction)    a. s/p ablation  . Tobacco abuse, in remission     Past Surgical History:  Procedure Laterality Date  . ANTERIOR CERVICAL DISCECTOMY  03/20/2001   Many levels.  Dr Christella Noa  . Bone spur removal right shoulder    . CHOLECYSTECTOMY    . CORONARY ARTERY BYPASS GRAFT  10/23/2000   x3 Dr Servando Snare  . FACIAL FRACTURE SURGERY     after being hit with baseball  . MOUTH SURGERY    . Pineal cyst removal    . PROCTOSCOPY N/A 09/19/2018   Procedure: RIGID PROCTOSCOPY;  Surgeon: Michael Boston, MD;  Location: [facility];  Service: General;  Laterality: N/A;   . V-TACH ABLATION N/A 02/15/2015   PVC ablation by Dr Lovena Le    Current Outpatient Medications  Medication Sig Dispense Refill  . aspirin 81 MG tablet Take 81 mg by mouth daily.    Marland Kitchen atorvastatin (LIPITOR) 10 MG tablet Take 10 mg by mouth at bedtime.   0  . Cholecalciferol (VITAMIN D3) 1000 units CAPS Take 1,000 Units by mouth daily.    Marland Kitchen glipiZIDE (GLUCOTROL) 10 MG tablet Take 15 mg by mouth 2 (two) times daily.  1  . levothyroxine (SYNTHROID, LEVOTHROID) 88 MCG tablet Take 88 mcg by mouth daily before breakfast.   0  . metFORMIN (GLUCOPHAGE) 1000 MG tablet Take 1,000 mg by mouth 2 (two) times daily.  3  . metoprolol tartrate (LOPRESSOR) 25 MG tablet TAKE 1 & 1/2 (ONE & ONE-HALF) TABLETS BY MOUTH TWICE DAILY 90 tablet 3  . nitroGLYCERIN (NITROSTAT) 0.4 MG SL tablet Place 1 tablet (0.4 mg total) under the tongue every 5 (five) minutes as needed for chest pain. 25 tablet 6  . pantoprazole (PROTONIX) 40 MG tablet Take 40 mg by mouth daily.  0  . pioglitazone (ACTOS) 30 MG tablet Take 30 mg by mouth daily.    . vitamin B-12 (CYANOCOBALAMIN) 1000 MCG tablet Take 1,000 mcg by mouth  daily.      No current facility-administered medications for this visit.    No Known Allergies  Social History   Socioeconomic History  . Marital status: Married    Spouse name: Not on file  . Number of children: 4  . Years of education: Not on file  . Highest education level: Not on file  Occupational History  . Occupation: Retired Nurse, mental health  . Smoking status: Former Smoker    Packs/day: 3.00    Years: 35.00    Pack years: 105.00    Types: Cigarettes    Quit date: 09/16/2000    Years since quitting: 19.2  . Smokeless tobacco: Never Used  Substance and Sexual Activity  . Alcohol use: No  . Drug use: No  . Sexual activity: Not on file  Other Topics Concern  . Not on file  Social History Narrative  . Not on file   Social Determinants of Health   Financial Resource Strain:   .  Difficulty of Paying Living Expenses: Not on file  Food Insecurity:   . Worried About Charity fundraiser in the Last Year: Not on file  . Ran Out of Food in the Last Year: Not on file  Transportation Needs:   . Lack of Transportation (Medical): Not on file  . Lack of Transportation (Non-Medical): Not on file  Physical Activity:   . Days of Exercise per Week: Not on file  . Minutes of Exercise per Session: Not on file  Stress:   . Feeling of Stress : Not on file  Social Connections:   . Frequency of Communication with Friends and Family: Not on file  . Frequency of Social Gatherings with Friends and Family: Not on file  . Attends Religious Services: Not on file  . Active Member of Clubs or Organizations: Not on file  . Attends Archivist Meetings: Not on file  . Marital Status: Not on file  Intimate Partner Violence:   . Fear of Current or Ex-Partner: Not on file  . Emotionally Abused: Not on file  . Physically Abused: Not on file  . Sexually Abused: Not on file    Family History  Problem Relation Age of Onset  . Heart attack Father 54  . Diabetes Father   . Diabetes Mother   . Heart attack Paternal Grandfather 74    Review of Systems:  As stated in the HPI and otherwise negative.   BP 110/86   Pulse (!) 59   Ht 5\' 5"  (1.651 m)   Wt 217 lb 12.8 oz (98.8 kg)   SpO2 98%   BMI 36.24 kg/m   Physical Examination:  General: Well developed, well nourished, NAD  HEENT: OP clear, mucus membranes moist  SKIN: warm, dry. No rashes. Neuro: No focal deficits  Musculoskeletal: Muscle strength 5/5 all ext  Psychiatric: Mood and affect normal  Neck: No JVD, no carotid bruits, no thyromegaly, no lymphadenopathy.  Lungs:Clear bilaterally, no wheezes, rhonci, crackles Cardiovascular: Regular rate and rhythm. No murmurs, gallops or rubs. Abdomen:Soft. Bowel sounds present. Non-tender.  Extremities: No lower extremity edema. Pulses are 2 + in the bilateral  DP/PT.  Cardiac cath December 2001:  1. Left main trunk: The left main trunk is a large caliber vessel with a  distal narrowing of 20%.  2. Left anterior descending: This is a large caliber vessel that provides  a diagonal branch in the mid section as well as several septal perforators  in  the proximal and mid section. The LAD has a long eccentric narrowing  of 50% in the mid section prior to the diagonal branch and a focal  high-grade narrowing of 90% distally in the LAD immediately after the  takeoff of the diagonal branch. The distal LAD has mild diffuse disease  as of the diagonal branch.  3. Left circumflex artery: This is a large caliber vessel that provides a  medium caliber first marginal branch in the proximal segment and a  larger second marginal branch distally. The AV circumflex has a narrowing  of 30% between the first and second marginal branch. The first marginal  branch has a narrowing of 60% in the proximal segment.  4. Right coronary artery: The right coronary artery is dominant. This is a  large caliber vessel that provides a posterior descending artery and a  small posterior ventricular branch in its terminal segment. There is an  ostial narrowing of 50% within the area of previous stenting. The  remainder of the stented segment in the proximal and mid section of the  vessel has mild disease of 20%. The ostium of the posterior descending  artery has a focal narrowing of 70%.  Echo 11/17/13: Left ventricle: The cavity size was normal. Wall thickness was increased in a pattern of mild LVH. There was moderate focal basal hypertrophy. Systolic function was normal. The estimated ejection fraction was in the range of 55% to 60%. Wall motion was normal; there were no regional wall motion abnormalities. - Left atrium: The atrium was mildly dilated. - Right ventricle: The cavity size was mildly dilated. Wall thickness was normal.  EKG:  EKG is ordered  today. The ekg ordered today demonstrates Sinus bradycardia, rate 59 bpm. PVCs.   Recent Labs: No results found for requested labs within last 8760 hours.   Lipid Panel No results found for: CHOL, TRIG, HDL, CHOLHDL, VLDL, LDLCALC, LDLDIRECT   Wt Readings from Last 3 Encounters:  12/18/19 217 lb 12.8 oz (98.8 kg)  12/31/18 193 lb 14.4 oz (88 kg)  12/30/18 193 lb (87.5 kg)     Other studies Reviewed: Additional studies/ records that were reviewed today include: . Review of the above records demonstrates:    Assessment and Plan:   1. CAD s/p CABG with angina: Nuclear stress test August 2019 with no ischemia. No chest pain suggestive of angina. Will continue ASA, beta blocker and statin.     2. HTN: BP is well controlled.   3. Tobacco abuse, in remission: He has stopped smoking  4. Hyperlipidemia:  Lipids followed in primary care. LDL is not on the KPN . Will continue statin.   5. PVCs/ventricular bigeminy: He is s/p PVC ablation in April 2016. No palpitations. Continue beta blocker.   Current medicines are reviewed at length with the patient today.  The patient does not have concerns regarding medicines.  The following changes have been made:  no change  Labs/ tests ordered today include:   Orders Placed This Encounter  Procedures  . EKG 12-Lead    Disposition:   FU with me or care team APP in 12 months.   Signed, Lauree Chandler, MD 12/18/2019 9:50 AM    [facility] [address], [place], [place]  [address] Phone: [phone]; Fax: [phone]

## 2019-12-18 ENCOUNTER — Encounter (INDEPENDENT_AMBULATORY_CARE_PROVIDER_SITE_OTHER): Payer: Self-pay

## 2019-12-18 ENCOUNTER — Encounter: Payer: Self-pay | Admitting: Cardiovascular Disease

## 2019-12-18 ENCOUNTER — Ambulatory Visit: Payer: Medicare Other | Admitting: Cardiovascular Disease

## 2019-12-18 ENCOUNTER — Other Ambulatory Visit: Payer: Self-pay

## 2019-12-18 VITALS — BP 110/86 | HR 59 | Ht 65.0 in | Wt 217.8 lb

## 2019-12-18 DIAGNOSIS — I493 Ventricular premature depolarization: Secondary | ICD-10-CM | POA: Diagnosis not present

## 2019-12-18 DIAGNOSIS — E785 Hyperlipidemia, unspecified: Secondary | ICD-10-CM | POA: Diagnosis not present

## 2019-12-18 DIAGNOSIS — I10 Essential (primary) hypertension: Secondary | ICD-10-CM

## 2019-12-18 DIAGNOSIS — I25810 Atherosclerosis of coronary artery bypass graft(s) without angina pectoris: Secondary | ICD-10-CM | POA: Diagnosis not present

## 2019-12-18 NOTE — Patient Instructions (Signed)

## 2020-05-07 ENCOUNTER — Telehealth: Payer: Self-pay | Admitting: Cardiovascular Disease

## 2020-05-07 NOTE — Telephone Encounter (Signed)
Pt needs a copy of his test results sent to the Urgent Care facility so he can get his DOT Physical. Please fax results to [facility] in [place], [place] at  [phone].  Please let the patient know when the results have been faxed to the Urgent Care

## 2020-05-11 NOTE — Telephone Encounter (Signed)
Patients wife, Bethena Roys, called again to have the patients stress test results within the past 2 years faxed to [facility] in [place] at [phone] for a DOT Physical. 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 Patient's wife requested to be called when this request is completed.

## 2020-05-17 NOTE — Telephone Encounter (Signed)
Left message for patient or his wife to call back.

## 2020-05-17 NOTE — Telephone Encounter (Signed)
Will send note from today to primary card nurse as pt's wife is calling today in regards to pt needs DOT letter.

## 2020-05-17 NOTE — Telephone Encounter (Signed)
Follow up ° ° °Pt's wife returning call °

## 2020-05-17 NOTE — Telephone Encounter (Signed)
Needs letter from Dr. 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  They have already received copy of the stress test done in 2019.  Needs faxed to [facility] in [place].  Adv pt I will fax letter and call her to inform once Dr. Angelena Form gives approval for this.

## 2020-05-17 NOTE — Telephone Encounter (Signed)
Follow up   Patient states that he needs a DOT letter. He states that his DOT will run out in a couple of days. Please call to discuss.

## 2020-05-18 ENCOUNTER — Encounter: Payer: Self-pay | Admitting: Cardiovascular Disease

## 2020-05-18 ENCOUNTER — Telehealth: Payer: Self-pay | Admitting: Cardiovascular Disease

## 2020-05-18 NOTE — Telephone Encounter (Signed)
Done. thanks

## 2020-05-18 NOTE — Telephone Encounter (Signed)
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  Wife would like to be called when this has been done.  She said it was kind of urgent.

## 2020-05-18 NOTE — Telephone Encounter (Addendum)
Letter faxed to [facility] [phone]. Patient's wife called and notified.

## 2020-05-18 NOTE — Telephone Encounter (Signed)
Letter done by Dr. Angelena Form and has been faxed.  Pt's wife aware and grateful.

## 2020-12-21 ENCOUNTER — Encounter: Payer: Self-pay | Admitting: Cardiovascular Disease

## 2020-12-21 ENCOUNTER — Ambulatory Visit: Payer: Medicare Other | Admitting: Cardiovascular Disease

## 2020-12-21 ENCOUNTER — Other Ambulatory Visit: Payer: Self-pay

## 2020-12-21 VITALS — BP 136/70 | HR 51 | Ht 65.0 in | Wt 221.0 lb

## 2020-12-21 DIAGNOSIS — I25810 Atherosclerosis of coronary artery bypass graft(s) without angina pectoris: Secondary | ICD-10-CM | POA: Diagnosis not present

## 2020-12-21 DIAGNOSIS — I10 Essential (primary) hypertension: Secondary | ICD-10-CM

## 2020-12-21 DIAGNOSIS — I493 Ventricular premature depolarization: Secondary | ICD-10-CM | POA: Diagnosis not present

## 2020-12-21 DIAGNOSIS — E785 Hyperlipidemia, unspecified: Secondary | ICD-10-CM

## 2020-12-21 DIAGNOSIS — Z01810 Encounter for preprocedural cardiovascular examination: Secondary | ICD-10-CM

## 2020-12-21 NOTE — Progress Notes (Signed)
Chief Complaint  Patient presents with  . Follow-up    CAD     History of Present Illness: 75 yo male with history of CAD s/p 3V CABG in 2001, HTN, DM, PVCs and HLD who is here today for cardiac follow up. His last cath was in December 2001 at which time his LAD shut down during attempted rotablator atherectomy leading to 3V CABG. Stress myoview in 2016 with no evidence of ischemia. He has undergone PVC ablation in April of 2016. Most recent stress test in August 2019 with no ischemia. Resection of colon mass on 09/19/18. He has been told this was cancer. Cardiac monitor December 2019 with PVCs, PACs and 8 beat run NSVT. His beta blocker was increased.   He is here today for follow up. The patient denies any chest pain, dyspnea, palpitations, lower extremity edema, orthopnea, PND, dizziness, near syncope or syncope. He is having a shoulder surgery for a rotator cuff tear.   Primary Care Physician: Leonard Downing, MD  Past Medical History:  Diagnosis Date  . Bladder infection    10th grade  . CAD (coronary artery disease)    s/p 3V CABG 2001  . Cervical spondylosis 2002   History of  . Colon cancer ([place])   . Diabetes mellitus ([place])   . Dysphagia   . GERD (gastroesophageal reflux disease)   . HTN (hypertension)   . Hyperlipidemia   . Hypothyroidism   . LVH (left ventricular hypertrophy) 2015   Mild  . Myocardial infarction ([place])    x2  . PVC (premature ventricular contraction)    a. s/p ablation  . Tobacco abuse, in remission     Past Surgical History:  Procedure Laterality Date  . ANTERIOR CERVICAL DISCECTOMY  03/20/2001   Many levels.  Dr Christella Noa  . Bone spur removal right shoulder    . CHOLECYSTECTOMY    . CORONARY ARTERY BYPASS GRAFT  10/23/2000   x3 Dr Servando Snare  . FACIAL FRACTURE SURGERY     after being hit with baseball  . MOUTH SURGERY    . Pineal cyst removal    . PROCTOSCOPY N/A 09/19/2018   Procedure: RIGID PROCTOSCOPY;  Surgeon: Michael Boston,  MD;  Location: [facility];  Service: General;  Laterality: N/A;  . V-TACH ABLATION N/A 02/15/2015   PVC ablation by Dr Lovena Le    Current Outpatient Medications  Medication Sig Dispense Refill  . aspirin 81 MG tablet Take 81 mg by mouth daily.    Marland Kitchen atorvastatin (LIPITOR) 10 MG tablet Take 10 mg by mouth at bedtime.   0  . Cholecalciferol (VITAMIN D3) 1000 units CAPS Take 1,000 Units by mouth daily.    Marland Kitchen glipiZIDE (GLUCOTROL) 10 MG tablet Take 15 mg by mouth 2 (two) times daily.  1  . levothyroxine (SYNTHROID, LEVOTHROID) 88 MCG tablet Take 88 mcg by mouth daily before breakfast.   0  . metFORMIN (GLUCOPHAGE) 1000 MG tablet Take 1,000 mg by mouth 2 (two) times daily.  3  . metoprolol tartrate (LOPRESSOR) 25 MG tablet TAKE 1 & 1/2 (ONE & ONE-HALF) TABLETS BY MOUTH TWICE DAILY 90 tablet 3  . nitroGLYCERIN (NITROSTAT) 0.4 MG SL tablet Place 1 tablet (0.4 mg total) under the tongue every 5 (five) minutes as needed for chest pain. 25 tablet 6  . pantoprazole (PROTONIX) 40 MG tablet Take 40 mg by mouth daily.  0  . pioglitazone (ACTOS) 30 MG tablet Take 30 mg by mouth daily.    Marland Kitchen  vitamin B-12 (CYANOCOBALAMIN) 1000 MCG tablet Take 1,000 mcg by mouth daily.      No current facility-administered medications for this visit.    No Known Allergies  Social History   Socioeconomic History  . Marital status: Married    Spouse name: Not on file  . Number of children: 4  . Years of education: Not on file  . Highest education level: Not on file  Occupational History  . Occupation: Retired Nurse, mental health  . Smoking status: Former Smoker    Packs/day: 3.00    Years: 35.00    Pack years: 105.00    Types: Cigarettes    Quit date: 09/16/2000    Years since quitting: 20.2  . Smokeless tobacco: Never Used  Vaping Use  . Vaping Use: Never used  Substance and Sexual Activity  . Alcohol use: No  . Drug use: No  . Sexual activity: Not on file  Other Topics Concern  . Not on file  Social  History Narrative  . Not on file   Social Determinants of Health   Financial Resource Strain: Not on file  Food Insecurity: Not on file  Transportation Needs: Not on file  Physical Activity: Not on file  Stress: Not on file  Social Connections: Not on file  Intimate Partner Violence: Not on file    Family History  Problem Relation Age of Onset  . Heart attack Father 65  . Diabetes Father   . Diabetes Mother   . Heart attack Paternal Grandfather 74    Review of Systems:  As stated in the HPI and otherwise negative.   BP 136/70   Pulse (!) 51   Ht 5\' 5"  (1.651 m)   Wt 221 lb (100.2 kg)   SpO2 98%   BMI 36.78 kg/m   Physical Examination:  General: Well developed, well nourished, NAD  HEENT: OP clear, mucus membranes moist  SKIN: warm, dry. No rashes. Neuro: No focal deficits  Musculoskeletal: Muscle strength 5/5 all ext  Psychiatric: Mood and affect normal  Neck: No JVD, no carotid bruits, no thyromegaly, no lymphadenopathy.  Lungs:Clear bilaterally, no wheezes, rhonci, crackles Cardiovascular: Regular rate and rhythm. No murmurs, gallops or rubs. Abdomen:Soft. Bowel sounds present. Non-tender.  Extremities: No lower extremity edema. Pulses are 2 + in the bilateral DP/PT.  Cardiac cath December 2001:  1. Left main trunk: The left main trunk is a large caliber vessel with a  distal narrowing of 20%.  2. Left anterior descending: This is a large caliber vessel that provides  a diagonal branch in the mid section as well as several septal perforators  in the proximal and mid section. The LAD has a long eccentric narrowing  of 50% in the mid section prior to the diagonal branch and a focal  high-grade narrowing of 90% distally in the LAD immediately after the  takeoff of the diagonal branch. The distal LAD has mild diffuse disease  as of the diagonal branch.  3. Left circumflex artery: This is a large caliber vessel that provides a  medium caliber first marginal  branch in the proximal segment and a  larger second marginal branch distally. The AV circumflex has a narrowing  of 30% between the first and second marginal branch. The first marginal  branch has a narrowing of 60% in the proximal segment.  4. Right coronary artery: The right coronary artery is dominant. This is a  large caliber vessel that provides a posterior descending artery and  a  small posterior ventricular branch in its terminal segment. There is an  ostial narrowing of 50% within the area of previous stenting. The  remainder of the stented segment in the proximal and mid section of the  vessel has mild disease of 20%. The ostium of the posterior descending  artery has a focal narrowing of 70%.  Echo 11/17/13: Left ventricle: The cavity size was normal. Wall thickness was increased in a pattern of mild LVH. There was moderate focal basal hypertrophy. Systolic function was normal. The estimated ejection fraction was in the range of 55% to 60%. Wall motion was normal; there were no regional wall motion abnormalities. - Left atrium: The atrium was mildly dilated. - Right ventricle: The cavity size was mildly dilated. Wall thickness was normal.  EKG:  EKG is ordered today. The ekg ordered today demonstrates sinus bradycardia  Recent Labs: No results found for requested labs within last 8760 hours.   Lipid Panel No results found for: CHOL, TRIG, HDL, CHOLHDL, VLDL, LDLCALC, LDLDIRECT   Wt Readings from Last 3 Encounters:  12/21/20 221 lb (100.2 kg)  12/18/19 217 lb 12.8 oz (98.8 kg)  12/31/18 193 lb 14.4 oz (88 kg)     Other studies Reviewed: Additional studies/ records that were reviewed today include: . Review of the above records demonstrates:    Assessment and Plan:   1. CAD s/p CABG with angina: He has no chest pain. Nuclear stress test August 2019 with no ischemia. Continue ASA, statin and beta blocker.   2. HTN: BP is controlled. No changes  today  3. Tobacco abuse, in remission: He stopped smoking  4. Hyperlipidemia:  Lipids followed in primary care. Continue statin.    5. PVCs/ventricular bigeminy: He is s/p PVC ablation in April 2016. He has no palpitations. Continue beta blocker.    6. Pre-operative cardiovascular examination: He has no signs of symptoms of angina, CHF or arrhythmias. He can proceed with his planned shoulder surgery.   Current medicines are reviewed at length with the patient today.  The patient does not have concerns regarding medicines.  The following changes have been made:  no change  Labs/ tests ordered today include:   Orders Placed This Encounter  Procedures  . EKG 12-Lead    Disposition:   F/U with me or care team APP in 12 months.   Signed, Lauree Chandler, MD 12/21/2020 3:52 PM    [facility] [place], [place], [place]  [address] Phone: [phone]; Fax: [phone]

## 2020-12-21 NOTE — Patient Instructions (Signed)

## 2020-12-23 ENCOUNTER — Telehealth: Payer: Self-pay | Admitting: *Deleted

## 2020-12-23 NOTE — Telephone Encounter (Signed)
   [facility] Pre-operative Risk Assessment    HEARTCARE STAFF: - Please ensure there is not already an duplicate clearance open for this procedure. - Under Visit Info/Reason for Call, type in Other and utilize the format Clearance MM/DD/YY or Clearance TBD. Do not use dashes or single digits. - If request is for dental extraction, please clarify the # of teeth to be extracted.  Request for surgical clearance:  1. What type of surgery is being performed? RIGHT SHOULDER ARTHROSCOPY   2. When is this surgery scheduled? 01/07/21   3. What type of clearance is required (medical clearance vs. Pharmacy clearance to hold med vs. Both)? MEDICAL  4. Are there any medications that need to be held prior to surgery and how long? ASA   5. Practice name and name of physician performing surgery? [facility]; DR. Lennette Bihari SUPPLE   6. What is the office phone number? [phone]   7.   What is the office fax number? [phone] ATTN: [place]  8.   Anesthesia type (None, local, MAC, general) ? GENERAL   Julaine Hua 12/23/2020, 1:06 PM  _________________________________________________________________   (provider comments below)

## 2020-12-24 NOTE — Telephone Encounter (Signed)
   Primary Cardiologist: Lauree Chandler, MD  Chart reviewed as part of pre-operative protocol coverage. Given past medical history and time since last visit, based on ACC/AHA guidelines, [name] [name] would be at acceptable risk for the planned procedure without further cardiovascular testing.   He was seen for preoperative evaluation 12/21/20 by Dr. Angelena Form. Will forward that clinic visit note as well.   Due to cardiac history recommend continuing aspirin throughout the perioperative period though if deemed necessity by surgeon may be held preoperatively and resumed as soon as felt safe.   I will route this recommendation to the requesting party via Epic fax function and remove from pre-op pool.  Please call with questions.  Loel Dubonnet, NP 12/24/2020, 2:46 PM

## 2021-03-11 ENCOUNTER — Ambulatory Visit: Payer: Medicare Other | Admitting: Cardiovascular Disease

## 2021-12-19 ENCOUNTER — Ambulatory Visit: Payer: Medicare Other | Admitting: Cardiovascular Disease

## 2021-12-19 ENCOUNTER — Encounter: Payer: Self-pay | Admitting: Cardiovascular Disease

## 2021-12-19 ENCOUNTER — Other Ambulatory Visit: Payer: Self-pay

## 2021-12-19 VITALS — BP 128/80 | HR 47 | Ht 65.0 in | Wt 210.0 lb

## 2021-12-19 DIAGNOSIS — E785 Hyperlipidemia, unspecified: Secondary | ICD-10-CM | POA: Diagnosis not present

## 2021-12-19 DIAGNOSIS — I493 Ventricular premature depolarization: Secondary | ICD-10-CM

## 2021-12-19 DIAGNOSIS — I25810 Atherosclerosis of coronary artery bypass graft(s) without angina pectoris: Secondary | ICD-10-CM | POA: Diagnosis not present

## 2021-12-19 DIAGNOSIS — I10 Essential (primary) hypertension: Secondary | ICD-10-CM

## 2021-12-19 NOTE — Patient Instructions (Signed)
Medication Instructions:  ?No changes ?*If you need a refill on your cardiac medications before your next appointment, please call your pharmacy* ? ? ?Lab Work: ?none ?If you have labs (blood work) drawn today and your tests are completely normal, you will receive your results only by: ?MyChart Message (if you have MyChart) OR ?A paper copy in the mail ?If you have any lab test that is abnormal or we need to change your treatment, we will call you to review the results. ? ? ?Testing/Procedures: ?none ? ? ?Follow-Up: ?At [facility], you and your health needs are our priority.  As part of our continuing mission to provide you with exceptional heart care, we have created designated Provider Care Teams.  These Care Teams include your primary Cardiologist (physician) and Advanced Practice Providers (APPs -  Physician Assistants and Nurse Practitioners) who all work together to provide you with the care you need, when you need it. ? ?We recommend signing up for the patient portal called "MyChart".  Sign up information is provided on this After Visit Summary.  MyChart is used to connect with patients for Virtual Visits (Telemedicine).  Patients are able to view lab/test results, encounter notes, upcoming appointments, etc.  Non-urgent messages can be sent to your provider as well.   ?To learn more about what you can do with MyChart, go to [email or web address].   ? ?Your next appointment:   ?12 month(s) ? ?The format for your next appointment:   ?In Person ? ?Provider:   ?Christopher McAlhany, MD   ? ? ?Other Instructions ?  ?
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
## 2021-12-30 ENCOUNTER — Ambulatory Visit: Payer: Medicare Other | Admitting: Cardiovascular Disease
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
## 2023-01-01 ENCOUNTER — Ambulatory Visit: Payer: Medicare Other | Attending: Cardiovascular Disease | Admitting: Cardiovascular Disease

## 2023-01-01 VITALS — BP 148/82 | HR 49 | Ht 65.0 in | Wt 205.2 lb

## 2023-01-01 DIAGNOSIS — I493 Ventricular premature depolarization: Secondary | ICD-10-CM | POA: Diagnosis not present

## 2023-01-01 DIAGNOSIS — I25810 Atherosclerosis of coronary artery bypass graft(s) without angina pectoris: Secondary | ICD-10-CM | POA: Diagnosis not present

## 2023-01-01 DIAGNOSIS — I10 Essential (primary) hypertension: Secondary | ICD-10-CM

## 2023-01-01 DIAGNOSIS — E785 Hyperlipidemia, unspecified: Secondary | ICD-10-CM | POA: Diagnosis not present

## 2023-01-01 NOTE — Patient Instructions (Signed)
Medication Instructions:  No changes *If you need a refill on your cardiac medications before your next appointment, please call your pharmacy*   Lab Work: none If you have labs (blood work) drawn today and your tests are completely normal, you will receive your results only by: Benjamin Perez (if you have MyChart) OR A paper copy in the mail If you have any lab test that is abnormal or we need to change your treatment, we will call you to review the results.   Testing/Procedures: none   Follow-Up: At [facility], you and your health needs are our priority.  As part of our continuing mission to provide you with exceptional heart care, we have created designated Provider Care Teams.  These Care Teams include your primary Cardiologist (physician) and Advanced Practice Providers (APPs -  Physician Assistants and Nurse Practitioners) who all work together to provide you with the care you need, when you need it.  We recommend signing up for the patient portal called "MyChart".  Sign up information is provided on this After Visit Summary.  MyChart is used to connect with patients for Virtual Visits (Telemedicine).  Patients are able to view lab/test results, encounter notes, upcoming appointments, etc.  Non-urgent messages can be sent to your provider as well.   To learn more about what you can do with MyChart, go to [email or web address].    Your next appointment:   12 month(s)  Provider:   Lauree Chandler, MD

## 2023-02-09 ENCOUNTER — Other Ambulatory Visit: Payer: Self-pay | Admitting: Family Medicine

## 2023-02-09 ENCOUNTER — Encounter: Payer: Self-pay | Admitting: Physician Assistant

## 2023-02-09 DIAGNOSIS — R413 Other amnesia: Secondary | ICD-10-CM

## 2023-02-09 DIAGNOSIS — R1310 Dysphagia, unspecified: Secondary | ICD-10-CM

## 2023-02-15 ENCOUNTER — Telehealth: Payer: Self-pay | Admitting: Cardiology

## 2023-02-15 NOTE — Telephone Encounter (Signed)
Patient/wife called in reporting his heart rate has been elevated in the 90-100 range over the past 3 days.  He is also felt poorly with increased fatigue and shortness of breath.  His blood sugars have also been elevated.  Denies any viral/infectious symptoms currently.  Baseline heart rate runs in the upper 40s to low 50 range normally.  Advised I am unable to determine what rhythm patient is in currently, given he is symptomatic would recommend that he be evaluated in the ED to rule out concerning arrhythmias and or HF issues. She was agreeable to this and thanked me for callback.

## 2023-02-21 ENCOUNTER — Telehealth: Payer: Self-pay | Admitting: Cardiovascular Disease

## 2023-02-21 NOTE — Telephone Encounter (Signed)
Spoke with patient's wife, [name]. She states patient has been having episodes of fast heart rate in 120's. She estimates he has had 7 or 8 episodes in the last 3 days. He may have more but has memory issues and his wife is not always aware when it happens. His baseline heart rate is 50.  When he has the elevated heart rate he feels dizzy and weak. He has not passed out. She has not checked his blood pressure.   Provided education on when he should go to the ED or call 911. Wife verbalized understanding. Scheduled for DOD appointment on 02/22/2023.

## 2023-02-21 NOTE — Telephone Encounter (Signed)
STAT if HR is under 50 or over 120 (normal HR is 60-100 beats per minute)  What is your heart rate?  Ranging 70-100, fluctuates up and down  Do you have a log of your heart rate readings (document readings)?  No log, but patient's wife states HR has been fluctuating for the past few days  Do you have any other symptoms?  Weakness/low energy, not feeling well overall, pressure in patients stomach

## 2023-02-22 ENCOUNTER — Other Ambulatory Visit: Payer: Self-pay

## 2023-02-22 ENCOUNTER — Ambulatory Visit: Payer: Medicare Other | Attending: Cardiovascular Disease | Admitting: Cardiovascular Disease

## 2023-02-22 ENCOUNTER — Inpatient Hospital Stay (HOSPITAL_COMMUNITY)
Admission: EM | Admit: 2023-02-22 | Discharge: 2023-02-25 | DRG: 439 | Disposition: A | Discharge status: Home / Self Care | Payer: Medicare Other | Attending: Internal Medicine | Admitting: Internal Medicine

## 2023-02-22 ENCOUNTER — Emergency Department (HOSPITAL_COMMUNITY): Payer: Medicare Other

## 2023-02-22 DIAGNOSIS — E119 Type 2 diabetes mellitus without complications: Secondary | ICD-10-CM | POA: Diagnosis not present

## 2023-02-22 DIAGNOSIS — Z79890 Hormone replacement therapy (postmenopausal): Secondary | ICD-10-CM

## 2023-02-22 DIAGNOSIS — K219 Gastro-esophageal reflux disease without esophagitis: Secondary | ICD-10-CM | POA: Diagnosis present

## 2023-02-22 DIAGNOSIS — Z79899 Other long term (current) drug therapy: Secondary | ICD-10-CM

## 2023-02-22 DIAGNOSIS — Z9049 Acquired absence of other specified parts of digestive tract: Secondary | ICD-10-CM

## 2023-02-22 DIAGNOSIS — E785 Hyperlipidemia, unspecified: Secondary | ICD-10-CM | POA: Diagnosis present

## 2023-02-22 DIAGNOSIS — I25810 Atherosclerosis of coronary artery bypass graft(s) without angina pectoris: Secondary | ICD-10-CM | POA: Diagnosis present

## 2023-02-22 DIAGNOSIS — Z7984 Long term (current) use of oral hypoglycemic drugs: Secondary | ICD-10-CM

## 2023-02-22 DIAGNOSIS — D696 Thrombocytopenia, unspecified: Secondary | ICD-10-CM | POA: Diagnosis present

## 2023-02-22 DIAGNOSIS — K589 Irritable bowel syndrome without diarrhea: Secondary | ICD-10-CM | POA: Diagnosis present

## 2023-02-22 DIAGNOSIS — K859 Acute pancreatitis without necrosis or infection, unspecified: Secondary | ICD-10-CM | POA: Diagnosis not present

## 2023-02-22 DIAGNOSIS — R627 Adult failure to thrive: Secondary | ICD-10-CM | POA: Diagnosis present

## 2023-02-22 DIAGNOSIS — N179 Acute kidney failure, unspecified: Secondary | ICD-10-CM | POA: Diagnosis present

## 2023-02-22 DIAGNOSIS — R413 Other amnesia: Secondary | ICD-10-CM | POA: Insufficient documentation

## 2023-02-22 DIAGNOSIS — I252 Old myocardial infarction: Secondary | ICD-10-CM

## 2023-02-22 DIAGNOSIS — C186 Malignant neoplasm of descending colon: Secondary | ICD-10-CM | POA: Diagnosis present

## 2023-02-22 DIAGNOSIS — Z833 Family history of diabetes mellitus: Secondary | ICD-10-CM

## 2023-02-22 DIAGNOSIS — I2510 Atherosclerotic heart disease of native coronary artery without angina pectoris: Secondary | ICD-10-CM | POA: Diagnosis present

## 2023-02-22 DIAGNOSIS — E1165 Type 2 diabetes mellitus with hyperglycemia: Secondary | ICD-10-CM | POA: Diagnosis present

## 2023-02-22 DIAGNOSIS — Z8249 Family history of ischemic heart disease and other diseases of the circulatory system: Secondary | ICD-10-CM

## 2023-02-22 DIAGNOSIS — R4189 Other symptoms and signs involving cognitive functions and awareness: Secondary | ICD-10-CM | POA: Insufficient documentation

## 2023-02-22 DIAGNOSIS — Z87891 Personal history of nicotine dependence: Secondary | ICD-10-CM

## 2023-02-22 DIAGNOSIS — Z85038 Personal history of other malignant neoplasm of large intestine: Secondary | ICD-10-CM

## 2023-02-22 DIAGNOSIS — Z951 Presence of aortocoronary bypass graft: Secondary | ICD-10-CM

## 2023-02-22 DIAGNOSIS — E039 Hypothyroidism, unspecified: Secondary | ICD-10-CM | POA: Diagnosis present

## 2023-02-22 DIAGNOSIS — R1310 Dysphagia, unspecified: Secondary | ICD-10-CM

## 2023-02-22 DIAGNOSIS — R109 Unspecified abdominal pain: Secondary | ICD-10-CM | POA: Diagnosis present

## 2023-02-22 DIAGNOSIS — Z7982 Long term (current) use of aspirin: Secondary | ICD-10-CM

## 2023-02-22 DIAGNOSIS — E871 Hypo-osmolality and hyponatremia: Secondary | ICD-10-CM | POA: Diagnosis not present

## 2023-02-22 DIAGNOSIS — E7800 Pure hypercholesterolemia, unspecified: Secondary | ICD-10-CM | POA: Diagnosis present

## 2023-02-22 DIAGNOSIS — I10 Essential (primary) hypertension: Secondary | ICD-10-CM | POA: Diagnosis present

## 2023-02-22 DIAGNOSIS — K580 Irritable bowel syndrome with diarrhea: Secondary | ICD-10-CM | POA: Diagnosis present

## 2023-02-22 DIAGNOSIS — R634 Abnormal weight loss: Secondary | ICD-10-CM | POA: Insufficient documentation

## 2023-02-22 DIAGNOSIS — G8929 Other chronic pain: Secondary | ICD-10-CM | POA: Diagnosis present

## 2023-02-22 LAB — COMPREHENSIVE METABOLIC PANEL
ALT: 22 U/L (ref 0–44)
AST: 22 U/L (ref 15–41)
Albumin: 3.6 g/dL (ref 3.5–5.0)
Alkaline Phosphatase: 82 U/L (ref 38–126)
Anion gap: 16 — ABNORMAL HIGH (ref 5–15)
BUN: 13 mg/dL (ref 8–23)
CO2: 23 mmol/L (ref 22–32)
Calcium: 9.7 mg/dL (ref 8.9–10.3)
Chloride: 92 mmol/L — ABNORMAL LOW (ref 98–111)
Creatinine, Ser: 1.49 mg/dL — ABNORMAL HIGH (ref 0.61–1.24)
GFR, Estimated: 49 mL/min — ABNORMAL LOW (ref >60)
Glucose, Bld: 348 mg/dL — ABNORMAL HIGH (ref 70–99)
Potassium: 4.7 mmol/L (ref 3.5–5.1)
Sodium: 131 mmol/L — ABNORMAL LOW (ref 135–145)
Total Bilirubin: 1.2 mg/dL (ref 0.3–1.2)
Total Protein: 6.9 g/dL (ref 6.5–8.1)

## 2023-02-22 LAB — CBC
HCT: 50.7 % (ref 39.0–52.0)
Hemoglobin: 16.6 g/dL (ref 13.0–17.0)
MCH: 31.6 pg (ref 26.0–34.0)
MCHC: 32.7 g/dL (ref 30.0–36.0)
MCV: 96.4 fL (ref 80.0–100.0)
Platelets: 189 10*3/uL (ref 150–400)
RBC: 5.26 MIL/uL (ref 4.22–5.81)
RDW: 11.9 % (ref 11.5–15.5)
WBC: 12.9 10*3/uL — ABNORMAL HIGH (ref 4.0–10.5)
nRBC: 0 % (ref 0.0–0.2)

## 2023-02-22 LAB — LIPASE, BLOOD: Lipase: 47 U/L (ref 11–51)

## 2023-02-22 LAB — BRAIN NATRIURETIC PEPTIDE: B Natriuretic Peptide: 77.1 pg/mL (ref 0.0–100.0)

## 2023-02-22 LAB — TROPONIN I (HIGH SENSITIVITY)
Troponin I (High Sensitivity): 12 ng/L (ref <18)
Troponin I (High Sensitivity): 11 ng/L (ref <18)

## 2023-02-22 MED ORDER — LACTATED RINGERS IV BOLUS
1000.0000 mL | Freq: Once | INTRAVENOUS | Status: AC
  Administered 2023-02-22: 1000 mL via INTRAVENOUS

## 2023-02-22 MED ORDER — FENTANYL CITRATE PF 50 MCG/ML IJ SOSY
50.0000 ug | PREFILLED_SYRINGE | Freq: Once | INTRAMUSCULAR | Status: AC
  Administered 2023-02-22: 50 ug via INTRAVENOUS
  Filled 2023-02-22: qty 1
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
## 2023-02-22 NOTE — ED Triage Notes (Signed)
Pt presents from home for intermittent center chest pain/epigastric pain x 2 weeks, elevated BP and elevated blood sugar (in 250 range x 1 week)

## 2023-02-22 NOTE — ED Provider Notes (Incomplete)
[place] EMERGENCY DEPARTMENT AT [facility] Provider Note  Medical Decision Making   HPI: [name] [name] is a 77 y.o. male with history perinent for T2DM, HLD, HTN, CAD s/p CABG, colon cancer in remission who presents complaining of abdominal pain. Patient arrived via POV accompanied by son, Gery Pray, and wife.  History provided by patient and family at bedside.  No interpreter required for this encounter.  Patient reports that symptoms began approximately 3 weeks ago.  Denies any chest pain, radiation of pain to left upper extremity, neck, upper back.  Reports that he has a gnawing epigastric pain that occurs intermittently without specific precipitating factors.  It does radiate to the back.  Not exertional.  He reports that over the same timeframe he has had decreased appetite, decreased p.o. intake, 20 pound unintentional weight loss per wife at bedside.  Son also reports that he has noted the patient's blood pressure has been somewhat higher over this time as well as elevated glucose.  ROS: As per HPI. Please see MAR for complete past medical history, surgical history, and social history.   Physical exam is pertinent for mild epigastric tenderness.   The differential includes but is not limited to pancreatic malignancy, pancreatitis, AKI, electrolyte derangement, ACS, bowel obstruction, ileus  Additional history obtained from: None External records from outside source obtained and reviewed including: Not indicated  ED provider interpretation of ECG: Spinal scoliosis rate 114, sinus rhythm, no ST elevations or depressions, T wave flattening in lead V1  ED provider interpretation of radiology/imaging: Reviewed chest x-ray, I do not appreciate any focal airspace opacification, cardiomediastinal silhouette derangement, pleural effusion, bony displacement, pneumothorax.  Personally reviewed patient's CT of abdomen and pelvis, I do not appreciate air-fluid levels, intra-abdominal free  air, significant intra-abdominal free fluid, I do appreciate fat stranding about the level of the pancreas, I do not appreciate large mass  Labs ordered were interpreted by myself as well as my attending and were incorporated into the medical decision making process for this patient.  ED provider interpretation of labs: Lipase 47, BMP WNL.  Initial troponin 12, delta 11.  CMP with mild AKI of 1.47 from baseline of approximately 4.  Otherwise no emergent electrolyte derangement, CBC with mild nonspecific leukocytosis to 12.9, no anemia or thrombocytopenia.  Interventions: fentanyl, LR  See the EMR for full details regarding lab and imaging results.  On exam, patient is overall vitally stable, well-appearing, patient adamantly denies having any chest pain over the past 3 weeks, though this is notably different from patient's triage note and listed chief complaint  Consults: ***  Disposition: {LSDISPO:29394}  The plan for this patient was discussed with Dr. ***, who voiced agreement and who oversaw evaluation and treatment of this patient.  Clinical Impression:  1. AKI (acute kidney injury)   2. Acute pancreatitis, unspecified complication status, unspecified pancreatitis type    Admit  Therapies: These medications and interventions were provided for the patient while in the ED. Medications  fentaNYL (SUBLIMAZE) injection 50 mcg (50 mcg Intravenous Given 02/22/23 2238)  lactated ringers bolus 1,000 mL (1,000 mLs Intravenous New Bag/Given 02/22/23 2237)    MDM generated using voice dictation software and may contain dictation errors.  Please contact me for any clarification or with any questions.  Clinical Complexity A medically appropriate history, review of systems, and physical exam was performed.  Collateral history obtained from: *** I personally reviewed the labs, EKG, imaging as*** discussed above. Patient's presentation is most consistent with {LSEMCOPA:29396} ***  Considered  and ruled out life and body threatening conditions  Treatment: {LSTREATMENT:51051} Patient's {EMSDOH:[phone]} increases the complexity of managing their presentation. Medications: {LSDRUGS:51050} Discussed patient's care with providers from the following different specialties: ***  Physical Exam   ED Triage Vitals  Enc Vitals Group     BP 02/22/23 1358 (!) 133/95     Pulse Rate 02/22/23 1358 (!) 111     Resp 02/22/23 1358 16     Temp 02/22/23 1358 98.1 F (36.7 C)     Temp Source 02/22/23 1837 Oral     SpO2 02/22/23 1358 97 %     Weight 02/22/23 1408 205 lb (93 kg)     Height 02/22/23 1408 5\' 5"  (1.651 m)     Head Circumference --      Peak Flow --      Pain Score 02/22/23 1408 9     Pain Loc --      Pain Edu? --      Excl. in GC? --      Physical Exam Vitals and nursing note reviewed.  Constitutional:      General: He is not in acute distress.    Appearance: He is well-developed.  HENT:     Head: Normocephalic and atraumatic.  Eyes:     Conjunctiva/sclera: Conjunctivae normal.  Cardiovascular:     Rate and Rhythm: Normal rate and regular rhythm.     Pulses:          Radial pulses are 2+ on the right side and 2+ on the left side.     Heart sounds: No murmur heard. Pulmonary:     Effort: Pulmonary effort is normal. No respiratory distress.     Breath sounds: Normal breath sounds.  Abdominal:     Palpations: Abdomen is soft.     Tenderness: There is abdominal tenderness in the epigastric area. There is no guarding or rebound. Negative signs include Murphy's sign and McBurney's sign.  Musculoskeletal:        General: No swelling.     Cervical back: Neck supple.  Skin:    General: Skin is warm and dry.     Capillary Refill: Capillary refill takes less than 2 seconds.  Neurological:     Mental Status: He is alert.  Psychiatric:        Mood and Affect: Mood normal.       Procedure Note  Procedures  CT ABDOMEN PELVIS WO CONTRAST  Final Result    DG Chest  1 View  Final Result      Julianne Rice, MD Emergency Medicine, PGY-2

## 2023-02-22 NOTE — ED Provider Triage Note (Signed)
Emergency Medicine Provider Triage Evaluation Note  [name] [name] , a 77 y.o. male  was evaluated in triage.  Pt complains of chest pain and shortness of breath.  Chest pain started couple days ago.  Located in the lower midsternal region extending down into the epigastric region.  At times radiates to the right upper quadrant.  It feels sharp.  Endorses associated shortness of breath.  Also states that within the last couple weeks he has been more short of breath with exertion.  States he can only walk a few steps around the house before he needs to stop and rest.  Wife states that this is unusual for him.  He is also had a cough.  Used to be a smoker but stopped in 2010.  Chest pain also nonreproducible.  Status post cholecystectomy.  Review of Systems  Positive: See above Negative: See above  Physical Exam  BP (!) 133/95 (BP Location: Right Arm)   Pulse (!) 111   Temp 98.1 F (36.7 C)   Resp 16   SpO2 97%  Gen:   Awake, no distress   Resp:  Normal effort  MSK:   Moves extremities without difficulty  Other:  Abdomen soft, nontender, nondistended  Medical Decision Making  Medically screening exam initiated at 2:04 PM.  Appropriate orders placed.  [name] [name] was informed that the remainder of the evaluation will be completed by another provider, this initial triage assessment does not replace that evaluation, and the importance of remaining in the ED until their evaluation is complete.  Work up started   [name], PA-C 02/22/23 1406

## 2023-02-22 NOTE — ED Provider Notes (Signed)
[place] EMERGENCY DEPARTMENT AT [facility] Provider Note  Medical Decision Making   HPI: TOBYN [name] is a 77 y.o. male with history perinent for T2DM, HLD, HTN, CAD s/p CABG, colon cancer in remission who presents complaining of abdominal pain. Patient arrived via POV accompanied by son, [name] [name], and wife.  History provided by patient and family at bedside.  No interpreter required for this encounter.  Patient reports that symptoms began approximately 3 weeks ago.  Denies any chest pain, radiation of pain to left upper extremity, neck, upper back.  Reports that he has a gnawing epigastric pain that occurs intermittently without specific precipitating factors.  It does radiate to the back.  Not exertional.  He reports that over the same timeframe he has had decreased appetite, decreased p.o. intake, 20 pound unintentional weight loss per wife at bedside.  Son also reports that he has noted the patient's blood pressure has been somewhat higher over this time as well as elevated glucose.  ROS: As per HPI. Please see MAR for complete past medical history, surgical history, and social history.   Physical exam is pertinent for mild epigastric tenderness.   The differential includes but is not limited to pancreatic malignancy, pancreatitis, AKI, electrolyte derangement, ACS, bowel obstruction, ileus  Additional history obtained from: None External records from outside source obtained and reviewed including: Not indicated  ED provider interpretation of ECG: Spinal scoliosis rate 114, sinus rhythm, no ST elevations or depressions, T wave flattening in lead V1  ED provider interpretation of radiology/imaging: Reviewed chest x-ray, I do not appreciate any focal airspace opacification, cardiomediastinal silhouette derangement, pleural effusion, bony displacement, pneumothorax.  Personally reviewed patient's CT of abdomen and pelvis, I do not appreciate air-fluid levels, intra-abdominal free  air, significant intra-abdominal free fluid, I do appreciate fat stranding about the level of the pancreas, I do not appreciate large mass  Labs ordered were interpreted by myself as well as my attending and were incorporated into the medical decision making process for this patient.  ED provider interpretation of labs: Lipase 47, BMP WNL.  Initial troponin 12, delta 11.  CMP with mild AKI of 1.47 from baseline of approximately 4.  Otherwise no emergent electrolyte derangement, CBC with mild nonspecific leukocytosis to 12.9, no anemia or thrombocytopenia.  Interventions: fentanyl, LR  See the EMR for full details regarding lab and imaging results.  On exam, patient is overall vitally stable, well-appearing, patient adamantly denies having any chest pain over the past 3 weeks, though this is notably different from patient's triage note.  By the time of my evaluation, patient has had 2 WNL troponins, therefore I feel that ACS is less likely, decisionally patient's pain is not exertional which is reassuring against cardiac etiology.  However, given decreased appetite, weight loss, patient's presentation is concerning for possible malignancy or intra-abdominal mass.  Patient's labs do demonstrate mild AKI which is consistent with report of poor p.o. intake.  Do feel that imaging is indicated, will obtain noncontrasted scan given patient's AKI.  LR and fentanyl administered for dehydration and pain respectively.  Patient does have peripancreatic fat stranding concerning for acute pancreatitis, overall though patient has WNL lipase, given his classic distribution of pain with radiation to the back as well as CT findings, this is most concerning for acute pancreatitis.  Overall given patient's comorbidities, frailty, concomitant AKI and pancreatitis, do feel that patient requires admission, medicine consulted for admission, patient triage to hospitalist, discussed with Dr. Rachael Darby.  Anticipate  admission.  Consults: Hospitalist  Disposition: ADMIT: I believe the patient requires admission for further care and management. The patient was admitted to hospitalists. Please see inpatient provider note for additional treatment plan details.   The plan for this patient was discussed with Dr. Lockie Mola, who voiced agreement and who oversaw evaluation and treatment of this patient.  Clinical Impression:  1. AKI (acute kidney injury)   2. Acute pancreatitis, unspecified complication status, unspecified pancreatitis type    Admit  Therapies: These medications and interventions were provided for the patient while in the ED. Medications  fentaNYL (SUBLIMAZE) injection 50 mcg (50 mcg Intravenous Given 02/22/23 2238)  lactated ringers bolus 1,000 mL (1,000 mLs Intravenous New Bag/Given 02/22/23 2237)    MDM generated using voice dictation software and may contain dictation errors.  Please contact me for any clarification or with any questions.  Clinical Complexity A medically appropriate history, review of systems, and physical exam was performed.  Collateral history obtained from: Family at bedside I personally reviewed the labs, EKG, imaging as discussed above. Patient's presentation is most consistent with acute complicated illness / injury requiring diagnostic workup Treatment: Hospitalization Medications: Parenteral controlled substances Discussed patient's care with providers from the following different specialties: Hospitalists  Physical Exam   ED Triage Vitals  Enc Vitals Group     BP 02/22/23 1358 (!) 133/95     Pulse Rate 02/22/23 1358 (!) 111     Resp 02/22/23 1358 16     Temp 02/22/23 1358 98.1 F (36.7 C)     Temp Source 02/22/23 1837 Oral     SpO2 02/22/23 1358 97 %     Weight 02/22/23 1408 205 lb (93 kg)     Height 02/22/23 1408  (1.651 m)     Head Circumference --      Peak Flow --      Pain Score 02/22/23 1408 9     Pain Loc --      Pain Edu? --       Excl. in GC? --      Physical Exam Vitals and nursing note reviewed.  Constitutional:      General: He is not in acute distress.    Appearance: He is well-developed.  HENT:     Head: Normocephalic and atraumatic.  Eyes:     Conjunctiva/sclera: Conjunctivae normal.  Cardiovascular:     Rate and Rhythm: Normal rate and regular rhythm.     Pulses:          Radial pulses are 2+ on the right side and 2+ on the left side.     Heart sounds: No murmur heard. Pulmonary:     Effort: Pulmonary effort is normal. No respiratory distress.     Breath sounds: Normal breath sounds.  Abdominal:     Palpations: Abdomen is soft.     Tenderness: There is abdominal tenderness in the epigastric area. There is no guarding or rebound. Negative signs include Murphy's sign and McBurney's sign.  Musculoskeletal:        General: No swelling.     Cervical back: Neck supple.  Skin:    General: Skin is warm and dry.     Capillary Refill: Capillary refill takes less than 2 seconds.  Neurological:     Mental Status: He is alert.  Psychiatric:        Mood and Affect: Mood normal.       Procedure Note  Procedures  CT ABDOMEN PELVIS WO CONTRAST  Final Result    DG Chest 1 View  Final Result      Julianne Rice, MD Emergency Medicine, PGY-2   Curley Spice, MD 02/23/23 0007    Virgina Norfolk, DO 02/23/23 1610

## 2023-02-22 NOTE — ED Provider Notes (Addendum)
Supervised resident visit.  Patient here with epigastric abdominal pain.  Has been having some pain for a while but worse today.  History of coronary artery disease, hypertension, high cholesterol, diabetes, colon cancer in remission.  He has not been able to eat or drink much.  He just has not felt well.  He is tender mostly in the epigastric region.  Differential diagnosis could be ACS or bowel obstruction or pancreatitis or cholecystitis or pneumonia.  Will get CBC, CMP, lipase, troponin.  EKG shows sinus tachycardia.  No ischemic changes.  Will get chest x-ray and CT scan abdomen pelvis.  Will give IV fluids.  Will give IV pain meds.  Per my review and interpretation of labs troponin is normal.  No significant anemia.  Gallbladder and liver enzymes within normal limits.  Lipase unremarkable.  He does have a mild acute kidney injury with a creatinine of 1.5.  CT shows possibly pancreatitis which does clinically fit despite a normal lipase, but given his hx of cancer, concern for possible oncology process as well.  I think he benefit from observation stay for IV hydration and pain control.  Patient to be admitted to medicine for further care.  This chart was dictated using voice recognition software.  Despite best efforts to proofread,  errors can occur which can change the documentation meaning.    Virgina Norfolk, DO 02/22/23 2334    Virgina Norfolk, DO 02/22/23 2343

## 2023-02-23 ENCOUNTER — Inpatient Hospital Stay (HOSPITAL_COMMUNITY): Payer: Medicare Other

## 2023-02-23 ENCOUNTER — Encounter (HOSPITAL_COMMUNITY): Payer: Self-pay | Admitting: Family Medicine

## 2023-02-23 DIAGNOSIS — E039 Hypothyroidism, unspecified: Secondary | ICD-10-CM | POA: Diagnosis present

## 2023-02-23 DIAGNOSIS — Z87891 Personal history of nicotine dependence: Secondary | ICD-10-CM | POA: Diagnosis not present

## 2023-02-23 DIAGNOSIS — Z79890 Hormone replacement therapy (postmenopausal): Secondary | ICD-10-CM | POA: Diagnosis not present

## 2023-02-23 DIAGNOSIS — Z9049 Acquired absence of other specified parts of digestive tract: Secondary | ICD-10-CM | POA: Diagnosis not present

## 2023-02-23 DIAGNOSIS — I10 Essential (primary) hypertension: Secondary | ICD-10-CM

## 2023-02-23 DIAGNOSIS — D696 Thrombocytopenia, unspecified: Secondary | ICD-10-CM | POA: Diagnosis present

## 2023-02-23 DIAGNOSIS — N179 Acute kidney failure, unspecified: Secondary | ICD-10-CM | POA: Diagnosis present

## 2023-02-23 DIAGNOSIS — R413 Other amnesia: Secondary | ICD-10-CM | POA: Insufficient documentation

## 2023-02-23 DIAGNOSIS — Z85038 Personal history of other malignant neoplasm of large intestine: Secondary | ICD-10-CM | POA: Diagnosis not present

## 2023-02-23 DIAGNOSIS — R627 Adult failure to thrive: Secondary | ICD-10-CM | POA: Diagnosis present

## 2023-02-23 DIAGNOSIS — R1310 Dysphagia, unspecified: Secondary | ICD-10-CM | POA: Diagnosis present

## 2023-02-23 DIAGNOSIS — R109 Unspecified abdominal pain: Secondary | ICD-10-CM | POA: Diagnosis present

## 2023-02-23 DIAGNOSIS — Z7982 Long term (current) use of aspirin: Secondary | ICD-10-CM | POA: Diagnosis not present

## 2023-02-23 DIAGNOSIS — E1165 Type 2 diabetes mellitus with hyperglycemia: Secondary | ICD-10-CM | POA: Diagnosis present

## 2023-02-23 DIAGNOSIS — Z8249 Family history of ischemic heart disease and other diseases of the circulatory system: Secondary | ICD-10-CM | POA: Diagnosis not present

## 2023-02-23 DIAGNOSIS — K859 Acute pancreatitis without necrosis or infection, unspecified: Secondary | ICD-10-CM | POA: Diagnosis present

## 2023-02-23 DIAGNOSIS — I252 Old myocardial infarction: Secondary | ICD-10-CM | POA: Diagnosis not present

## 2023-02-23 DIAGNOSIS — G8929 Other chronic pain: Secondary | ICD-10-CM | POA: Diagnosis present

## 2023-02-23 DIAGNOSIS — R4189 Other symptoms and signs involving cognitive functions and awareness: Secondary | ICD-10-CM | POA: Insufficient documentation

## 2023-02-23 DIAGNOSIS — I2510 Atherosclerotic heart disease of native coronary artery without angina pectoris: Secondary | ICD-10-CM | POA: Diagnosis present

## 2023-02-23 DIAGNOSIS — R634 Abnormal weight loss: Secondary | ICD-10-CM | POA: Insufficient documentation

## 2023-02-23 DIAGNOSIS — Z833 Family history of diabetes mellitus: Secondary | ICD-10-CM | POA: Diagnosis not present

## 2023-02-23 DIAGNOSIS — E7800 Pure hypercholesterolemia, unspecified: Secondary | ICD-10-CM | POA: Diagnosis present

## 2023-02-23 DIAGNOSIS — Z7984 Long term (current) use of oral hypoglycemic drugs: Secondary | ICD-10-CM | POA: Diagnosis not present

## 2023-02-23 DIAGNOSIS — K580 Irritable bowel syndrome with diarrhea: Secondary | ICD-10-CM | POA: Diagnosis present

## 2023-02-23 DIAGNOSIS — E871 Hypo-osmolality and hyponatremia: Secondary | ICD-10-CM | POA: Diagnosis not present

## 2023-02-23 DIAGNOSIS — Z951 Presence of aortocoronary bypass graft: Secondary | ICD-10-CM | POA: Diagnosis not present

## 2023-02-23 LAB — COMPREHENSIVE METABOLIC PANEL
ALT: 22 U/L (ref 0–44)
AST: 22 U/L (ref 15–41)
Albumin: 3 g/dL — ABNORMAL LOW (ref 3.5–5.0)
Alkaline Phosphatase: 68 U/L (ref 38–126)
Anion gap: 11 (ref 5–15)
BUN: 14 mg/dL (ref 8–23)
CO2: 25 mmol/L (ref 22–32)
Calcium: 9.2 mg/dL (ref 8.9–10.3)
Chloride: 98 mmol/L (ref 98–111)
Creatinine, Ser: 1.46 mg/dL — ABNORMAL HIGH (ref 0.61–1.24)
GFR, Estimated: 50 mL/min — ABNORMAL LOW (ref >60)
Glucose, Bld: 320 mg/dL — ABNORMAL HIGH (ref 70–99)
Potassium: 4.2 mmol/L (ref 3.5–5.1)
Sodium: 134 mmol/L — ABNORMAL LOW (ref 135–145)
Total Bilirubin: 0.8 mg/dL (ref 0.3–1.2)
Total Protein: 6 g/dL — ABNORMAL LOW (ref 6.5–8.1)

## 2023-02-23 LAB — CBC
HCT: 45.2 % (ref 39.0–52.0)
Hemoglobin: 15.2 g/dL (ref 13.0–17.0)
MCH: 31.8 pg (ref 26.0–34.0)
MCHC: 33.6 g/dL (ref 30.0–36.0)
MCV: 94.6 fL (ref 80.0–100.0)
Platelets: 139 10*3/uL — ABNORMAL LOW (ref 150–400)
RBC: 4.78 MIL/uL (ref 4.22–5.81)
RDW: 11.9 % (ref 11.5–15.5)
WBC: 9.8 10*3/uL (ref 4.0–10.5)
nRBC: 0 % (ref 0.0–0.2)

## 2023-02-23 LAB — GLUCOSE, CAPILLARY
Glucose-Capillary: 261 mg/dL — ABNORMAL HIGH (ref 70–99)
Glucose-Capillary: 173 mg/dL — ABNORMAL HIGH (ref 70–99)
Glucose-Capillary: 252 mg/dL — ABNORMAL HIGH (ref 70–99)

## 2023-02-23 LAB — HEMOGLOBIN A1C
Hgb A1c MFr Bld: 10.2 % — ABNORMAL HIGH (ref 4.8–5.6)
Mean Plasma Glucose: 246.04 mg/dL

## 2023-02-23 LAB — CBG MONITORING, ED: Glucose-Capillary: 314 mg/dL — ABNORMAL HIGH (ref 70–99)

## 2023-02-23 MED ORDER — INSULIN ASPART 100 UNIT/ML IJ SOLN
0.0000 [IU] | Freq: Three times a day (TID) | INTRAMUSCULAR | Status: DC
  Administered 2023-02-23: 2 [IU] via SUBCUTANEOUS
  Administered 2023-02-23: 5 [IU] via SUBCUTANEOUS
  Administered 2023-02-23 – 2023-02-24 (×2): 7 [IU] via SUBCUTANEOUS
  Administered 2023-02-24 – 2023-02-25 (×2): 3 [IU] via SUBCUTANEOUS

## 2023-02-23 MED ORDER — PANTOPRAZOLE SODIUM 40 MG PO TBEC
40.0000 mg | DELAYED_RELEASE_TABLET | Freq: Every day | ORAL | Status: DC
  Administered 2023-02-23: 40 mg via ORAL
  Filled 2023-02-23: qty 1

## 2023-02-23 MED ORDER — PANTOPRAZOLE SODIUM 40 MG IV SOLR
40.0000 mg | Freq: Two times a day (BID) | INTRAVENOUS | Status: DC
  Administered 2023-02-23 – 2023-02-25 (×4): 40 mg via INTRAVENOUS
  Filled 2023-02-23 (×4): qty 10

## 2023-02-23 MED ORDER — MORPHINE SULFATE (PF) 4 MG/ML IV SOLN
4.0000 mg | INTRAVENOUS | Status: DC | PRN
  Administered 2023-02-23 (×2): 4 mg via INTRAVENOUS
  Filled 2023-02-23 (×2): qty 1

## 2023-02-23 MED ORDER — LEVOTHYROXINE SODIUM 75 MCG PO TABS
75.0000 ug | ORAL_TABLET | Freq: Every day | ORAL | Status: DC
  Administered 2023-02-23 – 2023-02-25 (×3): 75 ug via ORAL
  Filled 2023-02-23 (×3): qty 1

## 2023-02-23 MED ORDER — ONDANSETRON HCL 4 MG PO TABS: 4.0000 mg | ORAL_TABLET | Freq: Four times a day (QID) | ORAL | Status: DC | PRN

## 2023-02-23 MED ORDER — ACETAMINOPHEN 650 MG RE SUPP: 650.0000 mg | Freq: Four times a day (QID) | RECTAL | Status: DC | PRN

## 2023-02-23 MED ORDER — GADOBUTROL 1 MMOL/ML IV SOLN
9.0000 mL | Freq: Once | INTRAVENOUS | Status: AC | PRN
  Administered 2023-02-23: 9 mL via INTRAVENOUS

## 2023-02-23 MED ORDER — ENOXAPARIN SODIUM 40 MG/0.4ML IJ SOSY
40.0000 mg | PREFILLED_SYRINGE | Freq: Every day | INTRAMUSCULAR | Status: DC
  Administered 2023-02-24 – 2023-02-25 (×2): 40 mg via SUBCUTANEOUS
  Filled 2023-02-23 (×3): qty 0.4

## 2023-02-23 MED ORDER — METOPROLOL TARTRATE 25 MG PO TABS
25.0000 mg | ORAL_TABLET | Freq: Every day | ORAL | Status: DC
  Administered 2023-02-23 – 2023-02-25 (×3): 25 mg via ORAL
  Filled 2023-02-23 (×3): qty 1

## 2023-02-23 MED ORDER — PANTOPRAZOLE SODIUM 40 MG IV SOLR
40.0000 mg | Freq: Two times a day (BID) | INTRAVENOUS | Status: DC
  Filled 2023-02-23: qty 10

## 2023-02-23 MED ORDER — SODIUM CHLORIDE 0.9 % IV SOLN: INTRAVENOUS | Status: DC

## 2023-02-23 MED ORDER — POLYETHYLENE GLYCOL 3350 17 G PO PACK: 17.0000 g | PACK | Freq: Every day | ORAL | Status: DC | PRN

## 2023-02-23 MED ORDER — ADULT MULTIVITAMIN W/MINERALS CH
1.0000 | ORAL_TABLET | Freq: Every day | ORAL | Status: DC
  Filled 2023-02-23: qty 1

## 2023-02-23 MED ORDER — ACETAMINOPHEN 325 MG PO TABS
650.0000 mg | ORAL_TABLET | Freq: Four times a day (QID) | ORAL | Status: DC | PRN
  Administered 2023-02-23: 650 mg via ORAL
  Filled 2023-02-23: qty 2

## 2023-02-23 MED ORDER — ENSURE ENLIVE PO LIQD
237.0000 mL | Freq: Two times a day (BID) | ORAL | Status: DC
  Administered 2023-02-24: 237 mL via ORAL
  Filled 2023-02-23: qty 237

## 2023-02-23 MED ORDER — ATORVASTATIN CALCIUM 10 MG PO TABS
10.0000 mg | ORAL_TABLET | Freq: Every day | ORAL | Status: DC
  Administered 2023-02-23 – 2023-02-24 (×2): 10 mg via ORAL
  Filled 2023-02-23 (×2): qty 1

## 2023-02-23 MED ORDER — ONDANSETRON HCL 4 MG/2ML IJ SOLN: 4.0000 mg | Freq: Four times a day (QID) | INTRAMUSCULAR | Status: DC | PRN

## 2023-02-23 NOTE — Progress Notes (Signed)
PROGRESS NOTE    [name] [name]  ZOX:[id number] DOB: Jul 18, 1946 DOA: 02/22/2023 PCP: Kaleen Mask, MD   Brief Narrative:  77 y.o. male with medical history significant of colon cancer in remission, myocardial infarction, coronary artery disease status post CABG, type 2 diabetes, IBS, hyperlipidemia, dysphagia, GERD, hypertension presented with worsening abdominal pain, poor oral intake and unintentional 20 pound weight loss along with intermittent diarrhea and constipation.  On presentation, lipase was 47, LFTs were normal, WBC of 12.9.  Chest x-ray showed no acute abnormality.  CT of abdomen without contrast showed hazy stranding around the pancreatic head consistent with acute pancreatitis.  He was started on IV fluids.  Assessment & Plan:   Possible acute pancreatitis Intermittent severe abdominal pain Failure to thrive/weight loss/poor oral intake -Presented with worsening abdominal pain, poor oral intake and unintentional 20 pound weight loss recently.  Lipase was only 47.  LFTs were normal.  Imaging as above. -Increase normal saline to 125 cc an hour.  Switch him to n.p.o. for now.  Discussed with Dr. Marjorie Smolder on phone who will see the patient on consultation: Recommended MRI/MRCP along with CA 19-9. -Continue pain management. -Switch Protonix to 40 mg IV twice a day  Hyponatremia -Mild.  Monitor.  Continue IV fluids  Diabetes mellitus type 2 with hyperglycemia -Blood sugars elevated.  Continue CBGs with SSI.  Might need long-acting insulin.  Acute kidney injury -Creatinine 1.49 on presentation.  Creatinine 1.46 today.  IV fluids as above.  Repeat a.m. labs.  Dysphagia -Await GI evaluation  Hypertension -Continue metoprolol  Hyperlipidemia -Continue statin  Hypothyroidism -Continue levothyroxine  Memory difficulty/impairment -Apparently was noticed gradual decline since 2015.  Fall precautions/delirium precautions.  Outpatient follow-up with PCP and/or  neurology  DVT prophylaxis: Lovenox Code Status: Full Family Communication: Wife at bedside Disposition Plan: Status is: Inpatient Remains inpatient appropriate because: Of severity of illness  Consultants: GI  Procedures: None  Antimicrobials: None   Subjective: Patient seen and examined at bedside.  Complains of intermittent abdominal pain.  No current nausea or vomiting.  No fever, chest pain or worsening shortness of breath reported.  Objective: Vitals:   02/23/23 0626 02/23/23 0809 02/23/23 0810 02/23/23 1213  BP:  (!) 147/80 (!) 147/80 (!) 159/87  Pulse:  70 65 80  Resp:  16  14  Temp: 98 F (36.7 C)     TempSrc:      SpO2:  98% 97% 98%  Weight:      Height:        Intake/Output Summary (Last 24 hours) at 02/23/2023 1302 Last data filed at 02/23/2023 0052 Gross per 24 hour  Intake 1000 ml  Output --  Net 1000 ml   Filed Weights   02/22/23 1408  Weight: 93 kg    Examination:  General exam: Appears calm and comfortable.  Looks chronically ill and deconditioned.  Elderly male lying in bed.  On room air. Respiratory system: Bilateral decreased breath sounds at bases with scattered crackles Cardiovascular system: S1 & S2 heard, Rate controlled Gastrointestinal system: Abdomen is nondistended, soft and mildly tender in the epigastric region.  Normal bowel sounds heard. Extremities: No cyanosis, clubbing, edema  Central nervous system: Alert and oriented.  Slow to respond.  No focal neurological deficits. Moving extremities Skin: No rashes, lesions or ulcers Psychiatry: Flat affect.  Not agitated.   Data Reviewed: I have personally reviewed following labs and imaging studies  CBC: Recent Labs  Lab 02/22/23 1407 02/23/23 0419  WBC 12.9*  9.8  HGB 16.6 15.2  HCT 50.7 45.2  MCV 96.4 94.6  PLT 189 139*   Basic Metabolic Panel: Recent Labs  Lab 02/22/23 1407 02/23/23 0419  NA 131* 134*  K 4.7 4.2  CL 92* 98  CO2 23 25  GLUCOSE 348* 320*  BUN 13  14  CREATININE 1.49* 1.46*  CALCIUM 9.7 9.2   GFR: Estimated Creatinine Clearance: 45.8 mL/min (A) (by C-G formula based on SCr of 1.46 mg/dL (H)). Liver Function Tests: Recent Labs  Lab 02/22/23 1407 02/23/23 0419  AST 22 22  ALT 22 22  ALKPHOS 82 68  BILITOT 1.2 0.8  PROT 6.9 6.0*  ALBUMIN 3.6 3.0*   Recent Labs  Lab 02/22/23 1407  LIPASE 47   No results for input(s): "AMMONIA" in the last 168 hours. Coagulation Profile: No results for input(s): "INR", "PROTIME" in the last 168 hours. Cardiac Enzymes: No results for input(s): "CKTOTAL", "CKMB", "CKMBINDEX", "TROPONINI" in the last 168 hours. BNP (last 3 results) No results for input(s): "PROBNP" in the last 8760 hours. HbA1C: Recent Labs    02/23/23 0419  HGBA1C 10.2*   CBG: Recent Labs  Lab 02/23/23 0757  GLUCAP 314*   Lipid Profile: No results for input(s): "CHOL", "HDL", "LDLCALC", "TRIG", "CHOLHDL", "LDLDIRECT" in the last 72 hours. Thyroid Function Tests: No results for input(s): "TSH", "T4TOTAL", "FREET4", "T3FREE", "THYROIDAB" in the last 72 hours. Anemia Panel: No results for input(s): "VITAMINB12", "FOLATE", "FERRITIN", "TIBC", "IRON", "RETICCTPCT" in the last 72 hours. Sepsis Labs: No results for input(s): "PROCALCITON", "LATICACIDVEN" in the last 168 hours.  No results found for this or any previous visit (from the past 240 hour(s)).       Radiology Studies: CT ABDOMEN PELVIS WO CONTRAST  Result Date: 02/22/2023 CLINICAL DATA:  Epigastric pain, weight loss.  Eval for malignancy. EXAM: CT ABDOMEN AND PELVIS WITHOUT CONTRAST TECHNIQUE: Multidetector CT imaging of the abdomen and pelvis was performed following the standard protocol without IV contrast. RADIATION DOSE REDUCTION: This exam was performed according to the departmental dose-optimization program which includes automated exposure control, adjustment of the mA and/or kV according to patient size and/or use of iterative reconstruction  technique. COMPARISON:  CT abdomen and pelvis 09/27/2018 FINDINGS: Lower chest: No acute abnormality. Hepatobiliary: Unremarkable noncontrast appearance of the liver. Cholecystectomy. No biliary dilation. Pancreas: Hazy stranding about the head of the pancreas. No pancreatic ductal dilation. Spleen: Unremarkable. Adrenals/Urinary Tract: Normal adrenal glands. No urinary calculi or hydronephrosis. Unremarkable bladder. Stomach/Bowel: Normal caliber large and small bowel. No bowel wall thickening. Normal appendix. Stomach is within normal limits. Vascular/Lymphatic: Aortic atherosclerosis. No enlarged abdominal or pelvic lymph nodes. Reproductive: Unremarkable. Other: No free intraperitoneal air.  No abdominal wall hernia. Musculoskeletal: No acute osseous abnormality. Thoracolumbar spondylosis. IMPRESSION: 1. Hazy stranding about the head of the pancreas consistent with acute pancreatitis. Given history of weight loss, close interval follow-up is recommended to ensure resolution and rule out underlying mass. Aortic Atherosclerosis (ICD10-I70.0). Electronically Signed   By: Minerva Fester M.D.   On: 02/22/2023 23:24   DG Chest 1 View  Result Date: 02/22/2023 CLINICAL DATA:  Chest pain. EXAM: CHEST  1 VIEW COMPARISON:  09/27/2018. FINDINGS: Clear lungs. 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 No pleural effusion or pneumothorax. Visualized bones and upper abdomen are unremarkable. IMPRESSION: No evidence of acute cardiopulmonary disease. Electronically Signed   By: Orvan Falconer M.D.   On: 02/22/2023 14:42  Scheduled Meds:  atorvastatin  10 mg Oral QHS   enoxaparin (LOVENOX) injection  40 mg Subcutaneous Daily   feeding supplement  237 mL Oral BID BM   insulin aspart  0-9 Units Subcutaneous TID WC   levothyroxine  75 mcg Oral QAC breakfast   metoprolol tartrate  25 mg Oral Q0600   multivitamin with minerals  1 tablet Oral Daily    pantoprazole  40 mg Oral Daily   Continuous Infusions:  sodium chloride 125 mL/hr at 02/23/23 0807          Glade Lloyd, MD [facility] 02/23/2023, 1:02 PM

## 2023-02-23 NOTE — Evaluation (Signed)
Physical Therapy Evaluation Patient Details Name: [name] [name] MRN: [id number] DOB: 1946-08-07 Today's Date: 02/23/2023  History of Present Illness  Patient is 77 y.o. male who presents for "pressure-like" epigastric abdominal pain for 2 months but worse in the past 2 weeks.  Patient reports no chest pain however wife states that earlier in the timeframe he was having substernal chest pain. In ED pt tachycardic and mildly hypertensive, epigastric tenderness noted on exam. CT abdomen pelvis concerning for possible pancreatic head malignancy vs acute pancreatitis. PMH significant for colon CA, MI, CAD s/p CABG, DM2, IBS, HLD, dysphagia, HTN, ACDF 2002.   Clinical Impression  [name] [name] is 77 y.o. male admitted with above HPI and diagnosis. Patient is currently limited by functional impairments below (see PT problem list). Patient lives with his spouse and is independent at baseline. Patient evaluated by Physical Therapy with no further acute PT needs identified. All education has been completed and the patient has no further questions. Pt is mobilizing at Mod I level for transfer and bed mob and supervision level for gait. Pt scored a 19/24 on DGI indicating fall risk with dynamic activities. He may benefit from follow up balance training in OP setting. Pt has no acute PT needs and is safe to mobilize with RN/NT staff. See below for any follow-up Physical Therapy or equipment needs. PT is signing off. Thank you for this referral.        Recommendations for follow up therapy are one component of a multi-disciplinary discharge planning process, led by the attending physician.  Recommendations may be updated based on patient status, additional functional criteria and insurance authorization.  Follow Up Recommendations       Assistance Recommended at Discharge PRN  Patient can return home with the following       Equipment Recommendations None recommended by PT  Recommendations for Other  Services       Functional Status Assessment Patient has not had a recent decline in their functional status     Precautions / Restrictions Precautions Precautions: Fall Restrictions Weight Bearing Restrictions: No      Mobility  Bed Mobility Overal bed mobility: Modified Independent             General bed mobility comments: use of bed/stretcher features    Transfers Overall transfer level: Modified independent Equipment used: None               General transfer comment: Use of hands for rise and lower from EOB, steady in standing, no LOB    Ambulation/Gait Ambulation/Gait assistance: Supervision Gait Distance (Feet): 400 Feet Assistive device: None Gait Pattern/deviations: Step-through pattern, Decreased stride length, Decreased dorsiflexion - right, Decreased dorsiflexion - left, Shuffle, Wide base of support Gait velocity: fair     General Gait Details: overall steady cuatious gait, feet with low clearance, shuffled steps on level surface  Stairs            Wheelchair Mobility    Modified Rankin (Stroke Patients Only)       Balance Overall balance assessment: Mild deficits observed, not formally tested               Single Leg Stance - Right Leg: 15 Single Leg Stance - Left Leg: 15             Standardized Balance Assessment Standardized Balance Assessment : Dynamic Gait Index   Dynamic Gait Index Level Surface: Normal Change in Gait Speed: Normal Gait with Horizontal Head Turns:  Mild Impairment Gait with Vertical Head Turns: Mild Impairment Gait and Pivot Turn: Normal Step Over Obstacle: Mild Impairment Step Around Obstacles: Mild Impairment Steps: Mild Impairment (not tested based on SLS) Total Score: 19       Pertinent Vitals/Pain Pain Assessment Pain Assessment: No/denies pain    Home Living Family/patient expects to be discharged to:: Private residence Living Arrangements: Spouse/significant  other;Children Available Help at Discharge: Family Type of Home: House Home Access: Stairs to enter Entrance Stairs-Rails: Left Entrance Stairs-Number of Steps: 3   Home Layout: One level Home Equipment: Agricultural consultant (2 wheels);Shower seat - built in;Cane - single point      Prior Function Prior Level of Function : Independent/Modified Independent                     Hand Dominance   Dominant Hand: Right    Extremity/Trunk Assessment   Upper Extremity Assessment Upper Extremity Assessment: Overall WFL for tasks assessed    Lower Extremity Assessment Lower Extremity Assessment: Overall WFL for tasks assessed    Cervical / Trunk Assessment Cervical / Trunk Assessment: Normal  Communication   Communication: No difficulties  Cognition Arousal/Alertness: Awake/alert Behavior During Therapy: WFL for tasks assessed/performed Overall Cognitive Status: Within Functional Limits for tasks assessed                                          General Comments      Exercises     Assessment/Plan    PT Assessment Patient does not need any further PT services  PT Problem List Decreased activity tolerance;Decreased balance;Decreased mobility       PT Treatment Interventions DME instruction;Gait training;Stair training;Functional mobility training;Therapeutic activities;Therapeutic exercise;Balance training;Patient/family education    PT Goals (Current goals can be found in the Care Plan section)  Acute Rehab PT Goals Patient Stated Goal: get home today PT Goal Formulation: All assessment and education complete, DC therapy Time For Goal Achievement: 02/28/23 Potential to Achieve Goals: Good    Frequency Other (Comment)     Co-evaluation               AM-PAC PT "6 Clicks" Mobility  Outcome Measure Help needed turning from your back to your side while in a flat bed without using bedrails?: None Help needed moving from lying on your back to  sitting on the side of a flat bed without using bedrails?: None Help needed moving to and from a bed to a chair (including a wheelchair)?: None Help needed standing up from a chair using your arms (e.g., wheelchair or bedside chair)?: None Help needed to walk in hospital room?: A Little Help needed climbing 3-5 steps with a railing? : A Little 6 Click Score: 22    End of Session Equipment Utilized During Treatment: Gait belt Activity Tolerance: Patient tolerated treatment well Patient left: in bed;with call bell/phone within reach Nurse Communication: Mobility status PT Visit Diagnosis: Other abnormalities of gait and mobility (R26.89)    Time: 8119-1478 PT Time Calculation (min) (ACUTE ONLY): 24 min   Charges:   PT Evaluation $PT Eval Low Complexity: 1 Low PT Treatments $Gait Training: 8-22 mins        Wynn Maudlin, DPT Acute Rehabilitation Services Office [phone]  02/23/23 10:08 AM
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
## 2023-02-23 NOTE — Progress Notes (Signed)
OT Cancellation Note  Patient Details Name: [name] [name] MRN: [id number] DOB: 10/04/1946   Cancelled Treatment:    Reason Eval/Treat Not Completed: OT screened, no needs identified, will sign off PT notified OT of no needs at this time  Mateo Flow 02/23/2023, 8:47 AM
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
## 2023-02-23 NOTE — ED Notes (Signed)
Pt ambulated in hallway with steady gait and no complaints. 

## 2023-02-24 DIAGNOSIS — K859 Acute pancreatitis without necrosis or infection, unspecified: Secondary | ICD-10-CM | POA: Diagnosis not present

## 2023-02-24 LAB — COMPREHENSIVE METABOLIC PANEL
ALT: 22 U/L (ref 0–44)
AST: 18 U/L (ref 15–41)
Albumin: 2.9 g/dL — ABNORMAL LOW (ref 3.5–5.0)
Alkaline Phosphatase: 63 U/L (ref 38–126)
Anion gap: 13 (ref 5–15)
BUN: 8 mg/dL (ref 8–23)
CO2: 22 mmol/L (ref 22–32)
Calcium: 8.8 mg/dL — ABNORMAL LOW (ref 8.9–10.3)
Chloride: 99 mmol/L (ref 98–111)
Creatinine, Ser: 1.13 mg/dL (ref 0.61–1.24)
GFR, Estimated: >60 mL/min (ref >60)
Glucose, Bld: 232 mg/dL — ABNORMAL HIGH (ref 70–99)
Potassium: 3.8 mmol/L (ref 3.5–5.1)
Sodium: 134 mmol/L — ABNORMAL LOW (ref 135–145)
Total Bilirubin: 0.8 mg/dL (ref 0.3–1.2)
Total Protein: 5.6 g/dL — ABNORMAL LOW (ref 6.5–8.1)

## 2023-02-24 LAB — GLUCOSE, CAPILLARY
Glucose-Capillary: 208 mg/dL — ABNORMAL HIGH (ref 70–99)
Glucose-Capillary: 239 mg/dL — ABNORMAL HIGH (ref 70–99)
Glucose-Capillary: 326 mg/dL — ABNORMAL HIGH (ref 70–99)
Glucose-Capillary: 245 mg/dL — ABNORMAL HIGH (ref 70–99)

## 2023-02-24 LAB — TRIGLYCERIDES: Triglycerides: 110 mg/dL (ref <150)

## 2023-02-24 LAB — MAGNESIUM: Magnesium: 1.5 mg/dL — ABNORMAL LOW (ref 1.7–2.4)

## 2023-02-24 MED ORDER — INSULIN GLARGINE-YFGN 100 UNIT/ML ~~LOC~~ SOLN
10.0000 [IU] | Freq: Every day | SUBCUTANEOUS | Status: DC
  Administered 2023-02-24: 10 [IU] via SUBCUTANEOUS
  Filled 2023-02-24 (×2): qty 0.1

## 2023-02-24 MED ORDER — MAGNESIUM SULFATE 2 GM/50ML IV SOLN
2.0000 g | Freq: Once | INTRAVENOUS | Status: AC
  Administered 2023-02-24: 2 g via INTRAVENOUS
  Filled 2023-02-24: qty 50

## 2023-02-24 NOTE — Progress Notes (Signed)
Subjective: Patient denies any further abdominal pain.  He is ready to advance his diet to full liquids.  Objective: Vital signs in last 24 hours: Temp:  [97.5 F (36.4 C)-98.2 F (36.8 C)] 97.5 F (36.4 C) (04/20 0805) Pulse Rate:  [57-88] 84 (04/20 0805) Resp:  [14-18] 17 (04/20 0805) BP: (135-162)/(72-97) 135/97 (04/20 0805) SpO2:  [96 %-99 %] 97 % (04/20 0805) Weight change:  Last BM Date : 02/22/23  PE: Not in distress GENERAL: Nonicteric, no pallor ABDOMEN: Soft, nondistended, nontender, no rebound tenderness or guarding, normal bowel sounds EXTREMITIES: No edema  Lab Results: Results for orders placed or performed during the hospital encounter of 02/22/23 (from the past 48 hour(s))  CBC     Status: Abnormal   Collection Time: 02/22/23  2:07 PM  Result Value Ref Range   WBC 12.9 (H) 4.0 - 10.5 K/uL   RBC 5.26 4.22 - 5.81 MIL/uL   Hemoglobin 16.6 13.0 - 17.0 g/dL   HCT 11.9 14.7 - 82.9 %   MCV 96.4 80.0 - 100.0 fL   MCH 31.6 26.0 - 34.0 pg   MCHC 32.7 30.0 - 36.0 g/dL   RDW 56.2 13.0 - 86.5 %   Platelets 189 150 - 400 K/uL   nRBC 0.0 0.0 - 0.2 %    Comment: Performed at [facility], [address], [place], [place] [address]  Troponin I (High Sensitivity)     Status: None   Collection Time: 02/22/23  2:07 PM  Result Value Ref Range   Troponin I (High Sensitivity) 12 <18 ng/L    Comment: (NOTE) Elevated high sensitivity troponin I (hsTnI) values and significant  changes across serial measurements may suggest ACS but many other  chronic and acute conditions are known to elevate hsTnI results.  Refer to the "Links" section for chest pain algorithms and additional  guidance. Performed at [facility], [address] [place], [place] [address]   Lipase, blood     Status: None   Collection Time: 02/22/23  2:07 PM  Result Value Ref Range   Lipase 47 11 - 51 U/L    Comment: Performed at [facility], [address], [place], [place] [address]   Comprehensive metabolic panel     Status: Abnormal   Collection Time: 02/22/23  2:07 PM  Result Value Ref Range   Sodium 131 (L) 135 - 145 mmol/L   Potassium 4.7 3.5 - 5.1 mmol/L   Chloride 92 (L) 98 - 111 mmol/L   CO2 23 22 - 32 mmol/L   Glucose, Bld 348 (H) 70 - 99 mg/dL    Comment: Glucose reference range applies only to samples taken after fasting for at least 8 hours.   BUN 13 8 - 23 mg/dL   Creatinine, Ser 4.40 (H) 0.61 - 1.24 mg/dL   Calcium 9.7 8.9 - 10.2 mg/dL   Total Protein 6.9 6.5 - 8.1 g/dL   Albumin 3.6 3.5 - 5.0 g/dL   AST 22 15 - 41 U/L   ALT 22 0 - 44 U/L   Alkaline Phosphatase 82 38 - 126 U/L   Total Bilirubin 1.2 0.3 - 1.2 mg/dL   GFR, Estimated 49 (L) >60 mL/min    Comment: (NOTE) Calculated using the CKD-EPI Creatinine Equation (2021)    Anion gap 16 (H) 5 - 15    Comment: Performed at [facility], [address], [place], [place] [address]  Brain natriuretic peptide  Status: None   Collection Time: 02/22/23  2:30 PM  Result Value Ref Range   B Natriuretic Peptide 77.1 0.0 - 100.0 pg/mL    Comment: Performed at [facility], [address] [place], [place] [address]  Troponin I (High Sensitivity)     Status: None   Collection Time: 02/22/23  5:25 PM  Result Value Ref Range   Troponin I (High Sensitivity) 11 <18 ng/L    Comment: (NOTE) Elevated high sensitivity troponin I (hsTnI) values and significant  changes across serial measurements may suggest ACS but many other  chronic and acute conditions are known to elevate hsTnI results.  Refer to the "Links" section for chest pain algorithms and additional  guidance. Performed at [facility], [address] [place], [place] [address]   CBC     Status: Abnormal   Collection Time: 02/23/23  4:19 AM  Result Value Ref Range   WBC 9.8 4.0 - 10.5 K/uL   RBC 4.78 4.22 - 5.81 MIL/uL   Hemoglobin 15.2 13.0 - 17.0 g/dL   HCT 09.8 11.9 - 14.7 %   MCV 94.6 80.0 - 100.0 fL   MCH 31.8  26.0 - 34.0 pg   MCHC 33.6 30.0 - 36.0 g/dL   RDW 82.9 56.2 - 13.0 %   Platelets 139 (L) 150 - 400 K/uL   nRBC 0.0 0.0 - 0.2 %    Comment: Performed at [facility], [address] [place], [place] [address]  Comprehensive metabolic panel     Status: Abnormal   Collection Time: 02/23/23  4:19 AM  Result Value Ref Range   Sodium 134 (L) 135 - 145 mmol/L   Potassium 4.2 3.5 - 5.1 mmol/L   Chloride 98 98 - 111 mmol/L   CO2 25 22 - 32 mmol/L   Glucose, Bld 320 (H) 70 - 99 mg/dL    Comment: Glucose reference range applies only to samples taken after fasting for at least 8 hours.   BUN 14 8 - 23 mg/dL   Creatinine, Ser 4.69 (H) 0.61 - 1.24 mg/dL   Calcium 9.2 8.9 - 62.9 mg/dL   Total Protein 6.0 (L) 6.5 - 8.1 g/dL   Albumin 3.0 (L) 3.5 - 5.0 g/dL   AST 22 15 - 41 U/L   ALT 22 0 - 44 U/L   Alkaline Phosphatase 68 38 - 126 U/L   Total Bilirubin 0.8 0.3 - 1.2 mg/dL   GFR, Estimated 50 (L) >60 mL/min    Comment: (NOTE) Calculated using the CKD-EPI Creatinine Equation (2021)    Anion gap 11 5 - 15    Comment: Performed at [facility], [address], [place], [place] [address]  Hemoglobin A1c     Status: Abnormal   Collection Time: 02/23/23  4:19 AM  Result Value Ref Range   Hgb A1c MFr Bld 10.2 (H) 4.8 - 5.6 %    Comment: (NOTE) Pre diabetes:          5.7%-6.4%  Diabetes:              >6.4%  Glycemic control for   <7.0% adults with diabetes    Mean Plasma Glucose 246.04 mg/dL    Comment: Performed at [facility], [address], [place], [place] [address]  CBG monitoring, ED     Status: Abnormal   Collection Time: 02/23/23  7:57 AM  Result Value Ref Range   Glucose-Capillary 314 (H) 70 -  99 mg/dL    Comment: Glucose reference range applies only to samples taken after fasting for at least 8 hours.  Glucose, capillary     Status: Abnormal   Collection Time: 02/23/23  1:32 PM  Result Value Ref Range   Glucose-Capillary 261 (H) 70 - 99 mg/dL    Comment:  Glucose reference range applies only to samples taken after fasting for at least 8 hours.  Glucose, capillary     Status: Abnormal   Collection Time: 02/23/23  4:40 PM  Result Value Ref Range   Glucose-Capillary 173 (H) 70 - 99 mg/dL    Comment: Glucose reference range applies only to samples taken after fasting for at least 8 hours.  Glucose, capillary     Status: Abnormal   Collection Time: 02/23/23  8:14 PM  Result Value Ref Range   Glucose-Capillary 252 (H) 70 - 99 mg/dL    Comment: Glucose reference range applies only to samples taken after fasting for at least 8 hours.  Comprehensive metabolic panel     Status: Abnormal   Collection Time: 02/24/23 12:44 AM  Result Value Ref Range   Sodium 134 (L) 135 - 145 mmol/L   Potassium 3.8 3.5 - 5.1 mmol/L   Chloride 99 98 - 111 mmol/L   CO2 22 22 - 32 mmol/L   Glucose, Bld 232 (H) 70 - 99 mg/dL    Comment: Glucose reference range applies only to samples taken after fasting for at least 8 hours.   BUN 8 8 - 23 mg/dL   Creatinine, Ser 4.09 0.61 - 1.24 mg/dL   Calcium 8.8 (L) 8.9 - 10.3 mg/dL   Total Protein 5.6 (L) 6.5 - 8.1 g/dL   Albumin 2.9 (L) 3.5 - 5.0 g/dL   AST 18 15 - 41 U/L   ALT 22 0 - 44 U/L   Alkaline Phosphatase 63 38 - 126 U/L   Total Bilirubin 0.8 0.3 - 1.2 mg/dL   GFR, Estimated >81 >19 mL/min    Comment: (NOTE) Calculated using the CKD-EPI Creatinine Equation (2021)    Anion gap 13 5 - 15    Comment: Performed at [facility], [address], [place], [place] [address]  Magnesium     Status: Abnormal   Collection Time: 02/24/23 12:44 AM  Result Value Ref Range   Magnesium 1.5 (L) 1.7 - 2.4 mg/dL    Comment: Performed at [facility], [address], [place], [place] [address]  Glucose, capillary     Status: Abnormal   Collection Time: 02/24/23  8:05 AM  Result Value Ref Range   Glucose-Capillary 208 (H) 70 - 99 mg/dL    Comment: Glucose reference range applies only to samples taken after fasting for  at least 8 hours.    Studies/Results: MR ABDOMEN MRCP W WO CONTAST  Result Date: 02/23/2023 CLINICAL DATA:  Right upper quadrant abdominal pain. Left hemicolectomy November 2019 for colon cancer. EXAM: MRI ABDOMEN WITHOUT AND WITH CONTRAST (INCLUDING MRCP) TECHNIQUE: Multiplanar multisequence MR imaging of the abdomen was performed both before and after the administration of intravenous contrast. Heavily T2-weighted images of the biliary and pancreatic ducts were obtained, and three-dimensional MRCP images were rendered by post processing. CONTRAST:  9mL GADAVIST GADOBUTROL 1 MMOL/ML IV SOLN COMPARISON:  02/22/2023 CT scan FINDINGS: Despite efforts by the technologist and patient, motion artifact is present on today's exam and could not be eliminated. This reduces exam sensitivity and specificity. Lower chest: Prior median sternotomy. Hepatobiliary:  The common bile duct measures 5 mm in diameter and the common hepatic duct measures 6 mm in diameter. Smooth conical tapering of the CBD along the pancreatic head. No obvious filling defect in the common bile duct. No significant abnormal hepatic parenchymal lesion observed. Prior cholecystectomy. Pancreas: Peripancreatic edema especially along the pancreatic head. No dorsal pancreatic duct dilatation or obvious pancreatic mass is identified, with the understanding that the pancreatic head appears abnormal in general due to the local inflammation. No hypoenhancing mass characteristic of pancreatic adenocarcinoma is identified on the arterial phase images. Spleen:  Unremarkable Adrenals/Urinary Tract:  Unremarkable Stomach/Bowel: Left hemicolectomy. Vascular/Lymphatic: Atherosclerosis is present, including aortoiliac atherosclerotic disease. Patent celiac trunk and superior mesenteric artery. Other:  No supplemental non-categorized findings. Musculoskeletal: Lumbar spondylosis and degenerative disc disease. IMPRESSION: 1. Peripancreatic edema especially along the  pancreatic head, without obvious pancreatic mass or dorsal pancreatic duct dilatation. Appearance favors pancreatitis. Correlate with lipase levels. 2. Prior cholecystectomy.  No biliary dilatation. 3.  Aortic Atherosclerosis (ICD10-I70.0). 4. Lumbar spondylosis and degenerative disc disease. Electronically Signed   By: Gaylyn Rong M.D.   On: 02/23/2023 15:15   CT ABDOMEN PELVIS WO CONTRAST  Result Date: 02/22/2023 CLINICAL DATA:  Epigastric pain, weight loss.  Eval for malignancy. EXAM: CT ABDOMEN AND PELVIS WITHOUT CONTRAST TECHNIQUE: Multidetector CT imaging of the abdomen and pelvis was performed following the standard protocol without IV contrast. RADIATION DOSE REDUCTION: This exam was performed according to the departmental dose-optimization program which includes automated exposure control, adjustment of the mA and/or kV according to patient size and/or use of iterative reconstruction technique. COMPARISON:  CT abdomen and pelvis 09/27/2018 FINDINGS: Lower chest: No acute abnormality. Hepatobiliary: Unremarkable noncontrast appearance of the liver. Cholecystectomy. No biliary dilation. Pancreas: Hazy stranding about the head of the pancreas. No pancreatic ductal dilation. Spleen: Unremarkable. Adrenals/Urinary Tract: Normal adrenal glands. No urinary calculi or hydronephrosis. Unremarkable bladder. Stomach/Bowel: Normal caliber large and small bowel. No bowel wall thickening. Normal appendix. Stomach is within normal limits. Vascular/Lymphatic: Aortic atherosclerosis. No enlarged abdominal or pelvic lymph nodes. Reproductive: Unremarkable. Other: No free intraperitoneal air.  No abdominal wall hernia. Musculoskeletal: No acute osseous abnormality. Thoracolumbar spondylosis. IMPRESSION: 1. Hazy stranding about the head of the pancreas consistent with acute pancreatitis. Given history of weight loss, close interval follow-up is recommended to ensure resolution and rule out underlying mass. Aortic  Atherosclerosis (ICD10-I70.0). Electronically Signed   By: Minerva Fester M.D.   On: 02/22/2023 23:24   DG Chest 1 View  Result Date: 02/22/2023 CLINICAL DATA:  Chest pain. EXAM: CHEST  1 VIEW COMPARISON:  09/27/2018. FINDINGS: Clear lungs. 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 No pleural effusion or pneumothorax. Visualized bones and upper abdomen are unremarkable. IMPRESSION: No evidence of acute cardiopulmonary disease. Electronically Signed   By: Orvan Falconer M.D.   On: 02/22/2023 14:42    Medications: I have reviewed the patient's current medications.  Assessment: MRI/MRCP shows pancreatitis :peripancreatic edema along pancreatic head without PD dilation or obvious pancreatic mass CBD 5 mm, CHD 6 mm, no filling defect in CBD, prior cholecystectomy  CT without contrast showed hazy stranding about head of pancreas consistent with acute pancreatitis  Lipase normal at 47, hemoglobin 15.2, BUN 8(BUN was 14/creatinine 1.46 and GFR 50 yesterday and has improved to 8/1.13 and more than 60 today)  History of colon cancer, 2019, status post left hemicolectomy  Plan: Findings compatible with pancreatitis, etiology unknown. Patient denies alcohol use, has had  a prior cholecystectomy, bile duct appears normal on CBD. Will send labs for triglycerides, ANA and IgG subclasses. Lab has been sent for CA 19-9. Patient to be started on full liquids, continued on IV fluid at 100 cc an hour. Possible discharge in a.m. tomorrow. Okay to advance diet to low-fat for dinner if tolerated.  Kerin Salen, MD 02/24/2023, 10:32 AM

## 2023-02-24 NOTE — Plan of Care (Signed)
  Problem: Education: Goal: Ability to describe self-care measures that may prevent or decrease complications (Diabetes Survival Skills Education) will improve Outcome: Progressing   Problem: Education: Goal: Knowledge of General Education information will improve Description: Including pain rating scale, medication(s)/side effects and non-pharmacologic comfort measures Outcome: Progressing   Problem: Health Behavior/Discharge Planning: Goal: Ability to manage health-related needs will improve Outcome: Progressing   

## 2023-02-24 NOTE — Progress Notes (Signed)
PROGRESS NOTE    [name] [name]  WUJ:[id number] DOB: 06-Nov-1946 DOA: 02/22/2023 PCP: Kaleen Mask, MD   Brief Narrative:  77 y.o. male with medical history significant of colon cancer in remission, myocardial infarction, coronary artery disease status post CABG, type 2 diabetes, IBS, hyperlipidemia, dysphagia, GERD, hypertension presented with worsening abdominal pain, poor oral intake and unintentional 20 pound weight loss along with intermittent diarrhea and constipation.  On presentation, lipase was 47, LFTs were normal, WBC of 12.9.  Chest x-ray showed no acute abnormality.  CT of abdomen without contrast showed hazy stranding around the pancreatic head consistent with acute pancreatitis.  He was started on IV fluids.  GI consulted.  Assessment & Plan:   Possible acute pancreatitis Intermittent severe abdominal pain Failure to thrive/weight loss/poor oral intake -Presented with worsening abdominal pain, poor oral intake and unintentional 20 pound weight loss recently.  Lipase was only 47.  LFTs were normal.  Imaging as above. -Currently on IV fluids.  On clear liquid diet.  Feels slightly better.  Advance to full liquid diet.  Decrease IV fluids to 100 cc an hour.  MRI/MRCP negative for any signs of malignancy and showed features suggestive of acute pancreatitis.  CA 19-9 pending.  GI following. -Continue pain management. -Continue IV Protonix twice a day.  Hyponatremia -Mild.  Monitor.  Continue IV fluids as above.  Diabetes mellitus type 2 with hyperglycemia -Blood sugars elevated.  Continue CBGs with SSI.  Will start long-acting insulin.  Acute kidney injury -Creatinine 1.49 on presentation.  Creatinine improving to 1.13 today.  Still on IV fluids.  Repeat a.m. labs.    Hypomagnesemia -Replace.  Repeat a.m. labs  Dysphagia -GI following.  Hypertension -Continue metoprolol  Leukocytosis -Resolved  Thrombocytopenia -No labs today.  No signs of  bleeding  Hyperlipidemia -Continue statin  Hypothyroidism -Continue levothyroxine  Memory difficulty/impairment -Apparently was noticed gradual decline since 2015.  Fall precautions/delirium precautions.  Outpatient follow-up with PCP and/or neurology  Physical deconditioning -PT eval  DVT prophylaxis: Lovenox Code Status: Full Family Communication: Wife at bedside Disposition Plan: Status is: Inpatient Remains inpatient appropriate because: Of severity of illness  Consultants: GI  Procedures: None  Antimicrobials: None   Subjective: Patient seen and examined at bedside.  Still complains of intermittent abdominal pain but improving.  Tolerating clear liquid diet.  No chest pain, fever or worsening shortness of breath reported. Objective: Vitals:   02/23/23 2033 02/23/23 2355 02/24/23 0330 02/24/23 0805  BP: (!) 150/72 (!) 161/81 (!) 159/88 (!) 135/97  Pulse: 88 (!) 57 (!) 58 84  Resp: Temp: 98 F (36.7 C) 98 F (36.7 C) 98.2 F (36.8 C) (!) 97.5 F (36.4 C)  TempSrc: Oral Oral Oral Oral  SpO2: 99% 96% 98% 97%  Weight:      Height:        Intake/Output Summary (Last 24 hours) at 02/24/2023 0830 Last data filed at 02/24/2023 0507 Gross per 24 hour  Intake 2966.73 ml  Output --  Net 2966.73 ml    Filed Weights   02/22/23 1408  Weight: 93 kg    Examination:  General: On room air.  No distress.  Chronically ill and deconditioned looking.  Elderly male lying in bed. ENT/neck: No thyromegaly.  JVD is not elevated  respiratory: Decreased breath sounds at bases bilaterally with some crackles; no wheezing  CVS: S1-S2 heard, mild intermittent bradycardia present Abdominal: Soft, mildly tender in the epigastric region, slightly distended; no organomegaly,  bowel sounds are heard Extremities: Trace lower extremity edema; no cyanosis  CNS: Awake and alert.  Slow to respond.  Poor historian.  No focal neurologic deficit.  Moves extremities Lymph: No  obvious lymphadenopathy Skin: No obvious ecchymosis/lesions  psych: Mostly flat affect.  Currently not agitated. musculoskeletal: No obvious joint swelling/deformity    Data Reviewed: I have personally reviewed following labs and imaging studies  CBC: Recent Labs  Lab 02/22/23 1407 02/23/23 0419  WBC 12.9* 9.8  HGB 16.6 15.2  HCT 50.7 45.2  MCV 96.4 94.6  PLT 189 139*    Basic Metabolic Panel: Recent Labs  Lab 02/22/23 1407 02/23/23 0419 02/24/23 0044  NA 131* 134* 134*  K 4.7 4.2 3.8  CL 92* 98 99  CO2 GLUCOSE 348* 320* 232*  BUN CREATININE 1.49* 1.46* 1.13  CALCIUM 9.7 9.2 8.8*  MG  --   --  1.5*    GFR: Estimated Creatinine Clearance: 59.2 mL/min (by C-G formula based on SCr of 1.13 mg/dL). Liver Function Tests: Recent Labs  Lab 02/22/23 1407 02/23/23 0419 02/24/23 0044  AST ALT ALKPHOS 82 68 63  BILITOT 1.2 0.8 0.8  PROT 6.9 6.0* 5.6*  ALBUMIN 3.6 3.0* 2.9*    Recent Labs  Lab 02/22/23 1407  LIPASE 47    No results for input(s): "AMMONIA" in the last 168 hours. Coagulation Profile: No results for input(s): "INR", "PROTIME" in the last 168 hours. Cardiac Enzymes: No results for input(s): "CKTOTAL", "CKMB", "CKMBINDEX", "TROPONINI" in the last 168 hours. BNP (last 3 results) No results for input(s): "PROBNP" in the last 8760 hours. HbA1C: Recent Labs    02/23/23 0419  HGBA1C 10.2*    CBG: Recent Labs  Lab 02/23/23 0757 02/23/23 1332 02/23/23 1640 02/23/23 2014 02/24/23 0805  GLUCAP 314* 261* 173* 252* 208*    Lipid Profile: No results for input(s): "CHOL", "HDL", "LDLCALC", "TRIG", "CHOLHDL", "LDLDIRECT" in the last 72 hours. Thyroid Function Tests: No results for input(s): "TSH", "T4TOTAL", "FREET4", "T3FREE", "THYROIDAB" in the last 72 hours. Anemia Panel: No results for input(s): "VITAMINB12", "FOLATE", "FERRITIN", "TIBC", "IRON", "RETICCTPCT" in the last 72 hours. Sepsis Labs: No  results for input(s): "PROCALCITON", "LATICACIDVEN" in the last 168 hours.  No results found for this or any previous visit (from the past 240 hour(s)).       Radiology Studies: MR ABDOMEN MRCP W WO CONTAST  Result Date: 02/23/2023 CLINICAL DATA:  Right upper quadrant abdominal pain. Left hemicolectomy November 2019 for colon cancer. EXAM: MRI ABDOMEN WITHOUT AND WITH CONTRAST (INCLUDING MRCP) TECHNIQUE: Multiplanar multisequence MR imaging of the abdomen was performed both before and after the administration of intravenous contrast. Heavily T2-weighted images of the biliary and pancreatic ducts were obtained, and three-dimensional MRCP images were rendered by post processing. CONTRAST:  9mL GADAVIST GADOBUTROL 1 MMOL/ML IV SOLN COMPARISON:  02/22/2023 CT scan FINDINGS: Despite efforts by the technologist and patient, motion artifact is present on today's exam and could not be eliminated. This reduces exam sensitivity and specificity. Lower chest: Prior median sternotomy. Hepatobiliary: The common bile duct measures 5 mm in diameter and the common hepatic duct measures 6 mm in diameter. Smooth conical tapering of the CBD along the pancreatic head. No obvious filling defect in the common bile duct. No significant abnormal hepatic parenchymal lesion observed. Prior cholecystectomy. Pancreas: Peripancreatic edema especially along the pancreatic head. No dorsal pancreatic duct dilatation or obvious pancreatic  mass is identified, with the understanding that the pancreatic head appears abnormal in general due to the local inflammation. No hypoenhancing mass characteristic of pancreatic adenocarcinoma is identified on the arterial phase images. Spleen:  Unremarkable Adrenals/Urinary Tract:  Unremarkable Stomach/Bowel: Left hemicolectomy. Vascular/Lymphatic: Atherosclerosis is present, including aortoiliac atherosclerotic disease. Patent celiac trunk and superior mesenteric artery. Other:  No supplemental  non-categorized findings. Musculoskeletal: Lumbar spondylosis and degenerative disc disease. IMPRESSION: 1. Peripancreatic edema especially along the pancreatic head, without obvious pancreatic mass or dorsal pancreatic duct dilatation. Appearance favors pancreatitis. Correlate with lipase levels. 2. Prior cholecystectomy.  No biliary dilatation. 3.  Aortic Atherosclerosis (ICD10-I70.0). 4. Lumbar spondylosis and degenerative disc disease. Electronically Signed   By: Gaylyn Rong M.D.   On: 02/23/2023 15:15   CT ABDOMEN PELVIS WO CONTRAST  Result Date: 02/22/2023 CLINICAL DATA:  Epigastric pain, weight loss.  Eval for malignancy. EXAM: CT ABDOMEN AND PELVIS WITHOUT CONTRAST TECHNIQUE: Multidetector CT imaging of the abdomen and pelvis was performed following the standard protocol without IV contrast. RADIATION DOSE REDUCTION: This exam was performed according to the departmental dose-optimization program which includes automated exposure control, adjustment of the mA and/or kV according to patient size and/or use of iterative reconstruction technique. COMPARISON:  CT abdomen and pelvis 09/27/2018 FINDINGS: Lower chest: No acute abnormality. Hepatobiliary: Unremarkable noncontrast appearance of the liver. Cholecystectomy. No biliary dilation. Pancreas: Hazy stranding about the head of the pancreas. No pancreatic ductal dilation. Spleen: Unremarkable. Adrenals/Urinary Tract: Normal adrenal glands. No urinary calculi or hydronephrosis. Unremarkable bladder. Stomach/Bowel: Normal caliber large and small bowel. No bowel wall thickening. Normal appendix. Stomach is within normal limits. Vascular/Lymphatic: Aortic atherosclerosis. No enlarged abdominal or pelvic lymph nodes. Reproductive: Unremarkable. Other: No free intraperitoneal air.  No abdominal wall hernia. Musculoskeletal: No acute osseous abnormality. Thoracolumbar spondylosis. IMPRESSION: 1. Hazy stranding about the head of the pancreas consistent with  acute pancreatitis. Given history of weight loss, close interval follow-up is recommended to ensure resolution and rule out underlying mass. Aortic Atherosclerosis (ICD10-I70.0). Electronically Signed   By: Minerva Fester M.D.   On: 02/22/2023 23:24   DG Chest 1 View  Result Date: 02/22/2023 CLINICAL DATA:  Chest pain. EXAM: CHEST  1 VIEW COMPARISON:  09/27/2018. FINDINGS: Clear lungs. 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 No pleural effusion or pneumothorax. Visualized bones and upper abdomen are unremarkable. IMPRESSION: No evidence of acute cardiopulmonary disease. Electronically Signed   By: Orvan Falconer M.D.   On: 02/22/2023 14:42        Scheduled Meds:  atorvastatin  10 mg Oral QHS   enoxaparin (LOVENOX) injection  40 mg Subcutaneous Daily   feeding supplement  237 mL Oral BID BM   insulin aspart  0-9 Units Subcutaneous TID WC   levothyroxine  75 mcg Oral QAC breakfast   metoprolol tartrate  25 mg Oral Q0600   pantoprazole (PROTONIX) IV  40 mg Intravenous Q12H   Continuous Infusions:  sodium chloride 125 mL/hr at 02/24/23 0507          Glade Lloyd, MD [facility] 02/24/2023, 8:30 AM

## 2023-02-24 NOTE — Plan of Care (Signed)
  Problem: Education: Goal: Ability to describe self-care measures that may prevent or decrease complications (Diabetes Survival Skills Education) will improve Outcome: Progressing   Problem: Education: Goal: Knowledge of General Education information will improve Description: Including pain rating scale, medication(s)/side effects and non-pharmacologic comfort measures Outcome: Progressing   

## 2023-02-25 DIAGNOSIS — K859 Acute pancreatitis without necrosis or infection, unspecified: Secondary | ICD-10-CM | POA: Diagnosis not present

## 2023-02-25 LAB — COMPREHENSIVE METABOLIC PANEL
ALT: 19 U/L (ref 0–44)
AST: 19 U/L (ref 15–41)
Albumin: 2.8 g/dL — ABNORMAL LOW (ref 3.5–5.0)
Alkaline Phosphatase: 64 U/L (ref 38–126)
Anion gap: 6 (ref 5–15)
BUN: 9 mg/dL (ref 8–23)
CO2: 26 mmol/L (ref 22–32)
Calcium: 8.8 mg/dL — ABNORMAL LOW (ref 8.9–10.3)
Chloride: 105 mmol/L (ref 98–111)
Creatinine, Ser: 1.34 mg/dL — ABNORMAL HIGH (ref 0.61–1.24)
GFR, Estimated: 55 mL/min — ABNORMAL LOW (ref >60)
Glucose, Bld: 256 mg/dL — ABNORMAL HIGH (ref 70–99)
Potassium: 4.2 mmol/L (ref 3.5–5.1)
Sodium: 137 mmol/L (ref 135–145)
Total Bilirubin: 0.5 mg/dL (ref 0.3–1.2)
Total Protein: 5.5 g/dL — ABNORMAL LOW (ref 6.5–8.1)

## 2023-02-25 LAB — MAGNESIUM: Magnesium: 1.8 mg/dL (ref 1.7–2.4)

## 2023-02-25 LAB — CANCER ANTIGEN 19-9: CA 19-9: 67 U/mL — ABNORMAL HIGH (ref 0–35)

## 2023-02-25 LAB — GLUCOSE, CAPILLARY
Glucose-Capillary: 237 mg/dL — ABNORMAL HIGH (ref 70–99)
Glucose-Capillary: 289 mg/dL — ABNORMAL HIGH (ref 70–99)

## 2023-02-25 MED ORDER — ONDANSETRON HCL 4 MG PO TABS: 4.0000 mg | ORAL_TABLET | Freq: Four times a day (QID) | ORAL | 0 refills | Status: DC | PRN

## 2023-02-25 MED ORDER — INSULIN GLARGINE-YFGN 100 UNIT/ML ~~LOC~~ SOLN
15.0000 [IU] | Freq: Every day | SUBCUTANEOUS | Status: DC
  Administered 2023-02-25: 15 [IU] via SUBCUTANEOUS
  Filled 2023-02-25: qty 0.15

## 2023-02-25 MED ORDER — POLYETHYLENE GLYCOL 3350 17 G PO PACK: 17.0000 g | PACK | Freq: Every day | ORAL | 0 refills | Status: DC | PRN

## 2023-02-25 MED ORDER — PANTOPRAZOLE SODIUM 40 MG PO TBEC: 40.0000 mg | DELAYED_RELEASE_TABLET | Freq: Two times a day (BID) | ORAL | 0 refills | Status: DC

## 2023-02-25 MED ORDER — TRAMADOL HCL 50 MG PO TABS: 50.0000 mg | ORAL_TABLET | Freq: Four times a day (QID) | ORAL | 0 refills | Status: DC | PRN

## 2023-02-25 MED ORDER — GLUCERNA SHAKE PO LIQD
237.0000 mL | Freq: Three times a day (TID) | ORAL | Status: DC
  Administered 2023-02-25: 237 mL via ORAL

## 2023-02-25 NOTE — Discharge Summary (Signed)
Physician Discharge Summary  [name] [name] BJY:[id number] DOB: [birth year] DOA: 02/22/2023  PCP: Kaleen Mask, MD  Admit date: 02/22/2023 Discharge date: 02/25/2023  Admitted From: Home Disposition: Home  Recommendations for Outpatient Follow-up:  Follow up with PCP in 1 week with repeat CBC/CMP Outpatient follow-up with GI Follow up in ED if symptoms worsen or new appear   Home Health: No Equipment/Devices: None  Discharge Condition: Stable CODE STATUS: Full Diet recommendation: Heart healthy/carb modified  Brief/Interim Summary: 78 y.o. male with medical history significant of colon cancer in remission, myocardial infarction, coronary artery disease status post CABG, type 2 diabetes, IBS, hyperlipidemia, dysphagia, GERD, hypertension presented with worsening abdominal pain, poor oral intake and unintentional 20 pound weight loss along with intermittent diarrhea and constipation.  On presentation, lipase was 47, LFTs were normal, WBC of 12.9.  Chest x-ray showed no acute abnormality.  CT of abdomen without contrast showed hazy stranding around the pancreatic head consistent with acute pancreatitis.  He was started on IV fluids.  GI consulted.  MRI/MRCP of the abdomen was negative for any significant findings of malignancy and showed features suggestive of acute pancreatitis.  He was managed conservatively.  His condition has gradually improved and he is tolerating solid diet.  He will be discharged home today with close follow-up with PCP and GI.    Discharge Diagnoses:   Possible acute pancreatitis Intermittent severe abdominal pain Failure to thrive/weight loss/poor oral intake -Presented with worsening abdominal pain, poor oral intake and unintentional 20 pound weight loss recently.  Lipase was only 47.  LFTs were normal.  Imaging as above. -Treated with IV fluids, IV analgesics and n.p.o.  MRI/MRCP negative for any signs of malignancy and showed features suggestive of acute  pancreatitis.  CA 19-9 pending.  Triglycerides 110.  GI following. - His condition has gradually improved and he is tolerating solid diet.  He will be discharged home today with close follow-up with PCP and GI.   -Continue Protonix twice a day on discharge.   Hyponatremia -Resolved   Diabetes mellitus type 2 with hyperglycemia -Blood sugars elevated.  Carb modified diet.  Resume home regimen.  Acute kidney injury -Creatinine 1.49 on presentation.  Treated with IV fluids.  Improving.  Outpatient follow-up with  Hypomagnesemia -Improved.   Dysphagia -GI following.  Outpatient follow-up with GI.   Hypertension -Continue metoprolol   Leukocytosis -Resolved   Thrombocytopenia -No labs today.  No signs of bleeding   Hyperlipidemia -Continue statin   Hypothyroidism -Continue levothyroxine   Memory difficulty/impairment -Apparently was noticed gradual decline since 2015.  Fall precautions/delirium precautions.  Outpatient follow-up with PCP and/or neurology   Physical deconditioning -PT recommended no PT follow-up.  Discharge Instructions  Discharge Instructions     Diet - low sodium heart healthy   Complete by: As directed    Diet Carb Modified   Complete by: As directed    Increase activity slowly   Complete by: As directed       Allergies as of 02/25/2023   No Known Allergies      Medication List     TAKE these medications    aspirin 81 MG tablet Take 81 mg by mouth daily.   atorvastatin 10 MG tablet Commonly known as: LIPITOR Take 10 mg by mouth at bedtime.   glipiZIDE 10 MG 24 hr tablet Commonly known as: GLUCOTROL XL Take 20 mg by mouth daily.   levothyroxine 75 MCG tablet Commonly known as: SYNTHROID Take 75 mcg by mouth  every morning.   metFORMIN 1000 MG tablet Commonly known as: GLUCOPHAGE Take 1,000 mg by mouth 2 (two) times daily.   metoprolol tartrate 25 MG tablet Commonly known as: LOPRESSOR Take 12.5-25 mg by mouth daily at 6  (six) AM. Take 25 mg (1 tablet) by mouth every morning, and 12.5 mg (1/2 tablet) at bedtime.   nitroGLYCERIN 0.4 MG SL tablet Commonly known as: NITROSTAT Place 1 tablet (0.4 mg total) under the tongue every 5 (five) minutes as needed for chest pain.   ondansetron 4 MG tablet Commonly known as: ZOFRAN Take 1 tablet (4 mg total) by mouth every 6 (six) hours as needed for nausea.   pantoprazole 40 MG tablet Commonly known as: PROTONIX Take 1 tablet (40 mg total) by mouth 2 (two) times daily before a meal. What changed: when to take this   polyethylene glycol 17 g packet Commonly known as: MIRALAX / GLYCOLAX Take 17 g by mouth daily as needed for mild constipation.   solifenacin 10 MG tablet Commonly known as: VESICARE Take 10 mg by mouth daily.   traMADol 50 MG tablet Commonly known as: Ultram Take 1 tablet (50 mg total) by mouth every 6 (six) hours as needed.   Vitamin D3 25 MCG (1000 UT) Caps Take 2,000 Units by mouth daily.        Follow-up Information     Kaleen Mask, MD. Schedule an appointment as soon as possible for a visit in 1 week(s).   Specialty: Family Medicine Contact information: [address] [place] [address] [phone]         Charlott Rakes, MD. Schedule an appointment as soon as possible for a visit in 1 week(s).   Specialty: Gastroenterology Contact information: [address] [place] [address] [phone]                No Known Allergies  Consultations: GI   Procedures/Studies: MR ABDOMEN MRCP W WO CONTAST  Result Date: 02/23/2023 CLINICAL DATA:  Right upper quadrant abdominal pain. Left hemicolectomy November 2019 for colon cancer. EXAM: MRI ABDOMEN WITHOUT AND WITH CONTRAST (INCLUDING MRCP) TECHNIQUE: Multiplanar multisequence MR imaging of the abdomen was performed both before and after the administration of intravenous contrast. Heavily T2-weighted images of the biliary and  pancreatic ducts were obtained, and three-dimensional MRCP images were rendered by post processing. CONTRAST:  9mL GADAVIST GADOBUTROL 1 MMOL/ML IV SOLN COMPARISON:  02/22/2023 CT scan FINDINGS: Despite efforts by the technologist and patient, motion artifact is present on today's exam and could not be eliminated. This reduces exam sensitivity and specificity. Lower chest: Prior median sternotomy. Hepatobiliary: The common bile duct measures 5 mm in diameter and the common hepatic duct measures 6 mm in diameter. Smooth conical tapering of the CBD along the pancreatic head. No obvious filling defect in the common bile duct. No significant abnormal hepatic parenchymal lesion observed. Prior cholecystectomy. Pancreas: Peripancreatic edema especially along the pancreatic head. No dorsal pancreatic duct dilatation or obvious pancreatic mass is identified, with the understanding that the pancreatic head appears abnormal in general due to the local inflammation. No hypoenhancing mass characteristic of pancreatic adenocarcinoma is identified on the arterial phase images. Spleen:  Unremarkable Adrenals/Urinary Tract:  Unremarkable Stomach/Bowel: Left hemicolectomy. Vascular/Lymphatic: Atherosclerosis is present, including aortoiliac atherosclerotic disease. Patent celiac trunk and superior mesenteric artery. Other:  No supplemental non-categorized findings. Musculoskeletal: Lumbar spondylosis and degenerative disc disease. IMPRESSION: 1. Peripancreatic edema especially along the pancreatic head, without obvious pancreatic mass  or dorsal pancreatic duct dilatation. Appearance favors pancreatitis. Correlate with lipase levels. 2. Prior cholecystectomy.  No biliary dilatation. 3.  Aortic Atherosclerosis (ICD10-I70.0). 4. Lumbar spondylosis and degenerative disc disease. Electronically Signed   By: Gaylyn Rong M.D.   On: 02/23/2023 15:15   CT ABDOMEN PELVIS WO CONTRAST  Result Date: 02/22/2023 CLINICAL DATA:   Epigastric pain, weight loss.  Eval for malignancy. EXAM: CT ABDOMEN AND PELVIS WITHOUT CONTRAST TECHNIQUE: Multidetector CT imaging of the abdomen and pelvis was performed following the standard protocol without IV contrast. RADIATION DOSE REDUCTION: This exam was performed according to the departmental dose-optimization program which includes automated exposure control, adjustment of the mA and/or kV according to patient size and/or use of iterative reconstruction technique. COMPARISON:  CT abdomen and pelvis 09/27/2018 FINDINGS: Lower chest: No acute abnormality. Hepatobiliary: Unremarkable noncontrast appearance of the liver. Cholecystectomy. No biliary dilation. Pancreas: Hazy stranding about the head of the pancreas. No pancreatic ductal dilation. Spleen: Unremarkable. Adrenals/Urinary Tract: Normal adrenal glands. No urinary calculi or hydronephrosis. Unremarkable bladder. Stomach/Bowel: Normal caliber large and small bowel. No bowel wall thickening. Normal appendix. Stomach is within normal limits. Vascular/Lymphatic: Aortic atherosclerosis. No enlarged abdominal or pelvic lymph nodes. Reproductive: Unremarkable. Other: No free intraperitoneal air.  No abdominal wall hernia. Musculoskeletal: No acute osseous abnormality. Thoracolumbar spondylosis. IMPRESSION: 1. Hazy stranding about the head of the pancreas consistent with acute pancreatitis. Given history of weight loss, close interval follow-up is recommended to ensure resolution and rule out underlying mass. Aortic Atherosclerosis (ICD10-I70.0). Electronically Signed   By: Minerva Fester M.D.   On: 02/22/2023 23:24   DG Chest 1 View  Result Date: 02/22/2023 CLINICAL DATA:  Chest pain. EXAM: CHEST  1 VIEW COMPARISON:  09/27/2018. FINDINGS: Clear lungs. 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 No pleural effusion or pneumothorax. Visualized bones and upper abdomen are unremarkable. IMPRESSION: No  evidence of acute cardiopulmonary disease. Electronically Signed   By: Orvan Falconer M.D.   On: 02/22/2023 14:42      Subjective: Patient seen and examined at bedside.  She feels better and is tolerating solid diet.  Feels okay to go home today.  Denies worsening abdominal pain or vomiting.  Discharge Exam: Vitals:   02/25/23 0636 02/25/23 0825  BP: (!) 145/73 (!) 170/72  Pulse: (!) 52 70  Resp: 16 20  Temp: 97.9 F (36.6 C) 97.9 F (36.6 C)  SpO2: 98% 98%    General: Pt is alert, awake, not in acute distress.  On room air. Cardiovascular: rate controlled, S1/S2 + Respiratory: bilateral decreased breath sounds at bases Abdominal: Soft, NT, ND, bowel sounds + Extremities: Mild lower extremity edema, no cyanosis    The results of significant diagnostics from this hospitalization (including imaging, microbiology, ancillary and laboratory) are listed below for reference.     Microbiology: No results found for this or any previous visit (from the past 240 hour(s)).   Labs: BNP (last 3 results) Recent Labs    02/22/23 1430  BNP 77.1   Basic Metabolic Panel: Recent Labs  Lab 02/22/23 1407 02/23/23 0419 02/24/23 0044 02/25/23 0312  NA 131* 134* 134* 137  K 4.7 4.2 3.8 4.2  CL 92* 98 99 105  CO2 GLUCOSE 348* 320* 232* 256*  BUN CREATININE 1.49* 1.46* 1.13 1.34*  CALCIUM 9.7 9.2 8.8* 8.8*  MG  --   --  1.5* 1.8   Liver Function  Tests: Recent Labs  Lab 02/22/23 1407 02/23/23 0419 02/24/23 0044 02/25/23 0312  AST 22 22 18 19   ALT 22 22 22 19   ALKPHOS 82 68 63 64  BILITOT 1.2 0.8 0.8 0.5  PROT 6.9 6.0* 5.6* 5.5*  ALBUMIN 3.6 3.0* 2.9* 2.8*   Recent Labs  Lab 02/22/23 1407  LIPASE 47   No results for input(s): "AMMONIA" in the last 168 hours. CBC: Recent Labs  Lab 02/22/23 1407 02/23/23 0419  WBC 12.9* 9.8  HGB 16.6 15.2  HCT 50.7 45.2  MCV 96.4 94.6  PLT 189 139*   Cardiac Enzymes: No results for input(s):  "CKTOTAL", "CKMB", "CKMBINDEX", "TROPONINI" in the last 168 hours. BNP: Invalid input(s): "POCBNP" CBG: Recent Labs  Lab 02/24/23 0805 02/24/23 1156 02/24/23 1720 02/24/23 2118 02/25/23 0807  GLUCAP 208* 239* 326* 245* 237*   D-Dimer No results for input(s): "DDIMER" in the last 72 hours. Hgb A1c Recent Labs    02/23/23 0419  HGBA1C 10.2*   Lipid Profile Recent Labs    02/24/23 1040  TRIG 110   Thyroid function studies No results for input(s): "TSH", "T4TOTAL", "T3FREE", "THYROIDAB" in the last 72 hours.  Invalid input(s): "FREET3" Anemia work up No results for input(s): "VITAMINB12", "FOLATE", "FERRITIN", "TIBC", "IRON", "RETICCTPCT" in the last 72 hours. Urinalysis    Component Value Date/Time   COLORURINE YELLOW 09/27/2018 1110   APPEARANCEUR CLEAR 09/27/2018 1110   LABSPEC 1.008 09/27/2018 1110   PHURINE 6.0 09/27/2018 1110   GLUCOSEU 50 (A) 09/27/2018 1110   HGBUR NEGATIVE 09/27/2018 1110   BILIRUBINUR NEGATIVE 09/27/2018 1110   KETONESUR NEGATIVE 09/27/2018 1110   PROTEINUR NEGATIVE 09/27/2018 1110   UROBILINOGEN 0.2 03/20/2015 1346   NITRITE NEGATIVE 09/27/2018 1110   LEUKOCYTESUR NEGATIVE 09/27/2018 1110   Sepsis Labs Recent Labs  Lab 02/22/23 1407 02/23/23 0419  WBC 12.9* 9.8   Microbiology No results found for this or any previous visit (from the past 240 hour(s)).   Time coordinating discharge: 35 minutes  SIGNED:   Glade Lloyd, MD  [facility] 02/25/2023, 9:57 AM

## 2023-02-25 NOTE — Plan of Care (Signed)
Discharge instructions discussed with patient.  Patient instructed on home medications, restrictions, and follow up appointments. Belongings gathered and sent with patient.  Patients medications sent to [facility].  Patient discharged via wheelchair by Wynelle Fanny RN

## 2023-02-25 NOTE — Progress Notes (Signed)
Subjective: Patient reports complete resolution in abdominal pain.  He is being discharged today.  Objective: Vital signs in last 24 hours: Temp:  [97.9 F (36.6 C)-98 F (36.7 C)] 97.9 F (36.6 C) (04/21 0825) Pulse Rate:  [51-70] 58 (04/21 1000) Resp:  [16-20] 20 (04/21 0825) BP: (139-170)/(70-73) 157/73 (04/21 1000) SpO2:  [98 %-99 %] 98 % (04/21 1000) Weight change:  Last BM Date : 02/24/23  PE: Comfortable appearing GENERAL: No pallor, no icterus  ABDOMEN: Soft, nondistended, nontender, no rebound tenderness, normoactive bowel sounds EXTREMITIES: No deformity  Lab Results: Results for orders placed or performed during the hospital encounter of 02/22/23 (from the past 48 hour(s))  Glucose, capillary     Status: Abnormal   Collection Time: 02/23/23  1:32 PM  Result Value Ref Range   Glucose-Capillary 261 (H) 70 - 99 mg/dL    Comment: Glucose reference range applies only to samples taken after fasting for at least 8 hours.  Glucose, capillary     Status: Abnormal   Collection Time: 02/23/23  4:40 PM  Result Value Ref Range   Glucose-Capillary 173 (H) 70 - 99 mg/dL    Comment: Glucose reference range applies only to samples taken after fasting for at least 8 hours.  Glucose, capillary     Status: Abnormal   Collection Time: 02/23/23  8:14 PM  Result Value Ref Range   Glucose-Capillary 252 (H) 70 - 99 mg/dL    Comment: Glucose reference range applies only to samples taken after fasting for at least 8 hours.  Comprehensive metabolic panel     Status: Abnormal   Collection Time: 02/24/23 12:44 AM  Result Value Ref Range   Sodium 134 (L) 135 - 145 mmol/L   Potassium 3.8 3.5 - 5.1 mmol/L   Chloride 99 98 - 111 mmol/L   CO2 22 22 - 32 mmol/L   Glucose, Bld 232 (H) 70 - 99 mg/dL    Comment: Glucose reference range applies only to samples taken after fasting for at least 8 hours.   BUN 8 8 - 23 mg/dL   Creatinine, Ser 1.61 0.61 - 1.24 mg/dL   Calcium 8.8 (L) 8.9 - 10.3 mg/dL    Total Protein 5.6 (L) 6.5 - 8.1 g/dL   Albumin 2.9 (L) 3.5 - 5.0 g/dL   AST 18 15 - 41 U/L   ALT 22 0 - 44 U/L   Alkaline Phosphatase 63 38 - 126 U/L   Total Bilirubin 0.8 0.3 - 1.2 mg/dL   GFR, Estimated >09 >60 mL/min    Comment: (NOTE) Calculated using the CKD-EPI Creatinine Equation (2021)    Anion gap 13 5 - 15    Comment: Performed at [facility], [address], [place], [place] [address]  Magnesium     Status: Abnormal   Collection Time: 02/24/23 12:44 AM  Result Value Ref Range   Magnesium 1.5 (L) 1.7 - 2.4 mg/dL    Comment: Performed at [facility], [address] [place], [place] [address]  Glucose, capillary     Status: Abnormal   Collection Time: 02/24/23  8:05 AM  Result Value Ref Range   Glucose-Capillary 208 (H) 70 - 99 mg/dL    Comment: Glucose reference range applies only to samples taken after fasting for at least 8 hours.  Triglycerides     Status: None   Collection Time: 02/24/23 10:40 AM  Result Value Ref Range   Triglycerides 110 <150 mg/dL    Comment:  Performed at [facility], [address] [place], [place] [address]  Glucose, capillary     Status: Abnormal   Collection Time: 02/24/23 11:56 AM  Result Value Ref Range   Glucose-Capillary 239 (H) 70 - 99 mg/dL    Comment: Glucose reference range applies only to samples taken after fasting for at least 8 hours.  Glucose, capillary     Status: Abnormal   Collection Time: 02/24/23  5:20 PM  Result Value Ref Range   Glucose-Capillary 326 (H) 70 - 99 mg/dL    Comment: Glucose reference range applies only to samples taken after fasting for at least 8 hours.  Glucose, capillary     Status: Abnormal   Collection Time: 02/24/23  9:18 PM  Result Value Ref Range   Glucose-Capillary 245 (H) 70 - 99 mg/dL    Comment: Glucose reference range applies only to samples taken after fasting for at least 8 hours.  Comprehensive metabolic panel     Status: Abnormal   Collection Time: 02/25/23   3:12 AM  Result Value Ref Range   Sodium 137 135 - 145 mmol/L   Potassium 4.2 3.5 - 5.1 mmol/L   Chloride 105 98 - 111 mmol/L   CO2 26 22 - 32 mmol/L   Glucose, Bld 256 (H) 70 - 99 mg/dL    Comment: Glucose reference range applies only to samples taken after fasting for at least 8 hours.   BUN 9 8 - 23 mg/dL   Creatinine, Ser 5.28 (H) 0.61 - 1.24 mg/dL   Calcium 8.8 (L) 8.9 - 10.3 mg/dL   Total Protein 5.5 (L) 6.5 - 8.1 g/dL   Albumin 2.8 (L) 3.5 - 5.0 g/dL   AST 19 15 - 41 U/L   ALT 19 0 - 44 U/L   Alkaline Phosphatase 64 38 - 126 U/L   Total Bilirubin 0.5 0.3 - 1.2 mg/dL   GFR, Estimated 55 (L) >60 mL/min    Comment: (NOTE) Calculated using the CKD-EPI Creatinine Equation (2021)    Anion gap 6 5 - 15    Comment: Performed at [facility], [address] [place], [place] [address]  Magnesium     Status: None   Collection Time: 02/25/23  3:12 AM  Result Value Ref Range   Magnesium 1.8 1.7 - 2.4 mg/dL    Comment: Performed at [facility], [address] [place], [place] [address]  Glucose, capillary     Status: Abnormal   Collection Time: 02/25/23  8:07 AM  Result Value Ref Range   Glucose-Capillary 237 (H) 70 - 99 mg/dL    Comment: Glucose reference range applies only to samples taken after fasting for at least 8 hours.  Glucose, capillary     Status: Abnormal   Collection Time: 02/25/23 11:45 AM  Result Value Ref Range   Glucose-Capillary 289 (H) 70 - 99 mg/dL    Comment: Glucose reference range applies only to samples taken after fasting for at least 8 hours.    Studies/Results: MR ABDOMEN MRCP W WO CONTAST  Result Date: 02/23/2023 CLINICAL DATA:  Right upper quadrant abdominal pain. Left hemicolectomy November 2019 for colon cancer. EXAM: MRI ABDOMEN WITHOUT AND WITH CONTRAST (INCLUDING MRCP) TECHNIQUE: Multiplanar multisequence MR imaging of the abdomen was performed both before and after the administration of intravenous contrast. Heavily T2-weighted  images of the biliary and pancreatic ducts were obtained, and three-dimensional MRCP images were rendered by post processing. CONTRAST:  9mL GADAVIST  GADOBUTROL 1 MMOL/ML IV SOLN COMPARISON:  02/22/2023 CT scan FINDINGS: Despite efforts by the technologist and patient, motion artifact is present on today's exam and could not be eliminated. This reduces exam sensitivity and specificity. Lower chest: Prior median sternotomy. Hepatobiliary: The common bile duct measures 5 mm in diameter and the common hepatic duct measures 6 mm in diameter. Smooth conical tapering of the CBD along the pancreatic head. No obvious filling defect in the common bile duct. No significant abnormal hepatic parenchymal lesion observed. Prior cholecystectomy. Pancreas: Peripancreatic edema especially along the pancreatic head. No dorsal pancreatic duct dilatation or obvious pancreatic mass is identified, with the understanding that the pancreatic head appears abnormal in general due to the local inflammation. No hypoenhancing mass characteristic of pancreatic adenocarcinoma is identified on the arterial phase images. Spleen:  Unremarkable Adrenals/Urinary Tract:  Unremarkable Stomach/Bowel: Left hemicolectomy. Vascular/Lymphatic: Atherosclerosis is present, including aortoiliac atherosclerotic disease. Patent celiac trunk and superior mesenteric artery. Other:  No supplemental non-categorized findings. Musculoskeletal: Lumbar spondylosis and degenerative disc disease. IMPRESSION: 1. Peripancreatic edema especially along the pancreatic head, without obvious pancreatic mass or dorsal pancreatic duct dilatation. Appearance favors pancreatitis. Correlate with lipase levels. 2. Prior cholecystectomy.  No biliary dilatation. 3.  Aortic Atherosclerosis (ICD10-I70.0). 4. Lumbar spondylosis and degenerative disc disease. Electronically Signed   By: Gaylyn Rong M.D.   On: 02/23/2023 15:15    Medications: I have reviewed the patient's current  medications.  Assessment: Acute pancreatitis of unknown etiology(normal triglycerides, no history of significant alcohol use, postcholecystectomy) ANA and IgG subclasses sent, results pending  Plan: Okay to DC home from GI standpoint, recommend to take low-fat diet.  Advised to follow-up with primary gastroenterologist, Dr. Bosie Clos in 6 to 8 weeks. May need repeat imaging with MRI/MRCP in 3 months for concerns of unintentional weight loss and unknown cause of acute pancreatitis.  Kerin Salen, MD 02/25/2023, 11:48 AM

## 2023-02-25 NOTE — Progress Notes (Signed)
Changed Ensure to glucerna with Dr. Hanley Ben permission to hopefully assist with lowering CBG.

## 2023-02-25 NOTE — Progress Notes (Signed)
PT Cancellation Note  Patient Details Name: [name] [name] MRN: [id number] DOB: December 23, 1945   Cancelled Treatment:    Reason Eval/Treat Not Completed: PT screened, no needs identified, will sign off (Pt evaluated by PT 4/19 with no acute PT needs identified. Pt ambulating 400 ft with no AD or physical assist. Able to ambulate with nursing staff while inpatient).  Lillia Pauls, PT, DPT Acute Rehabilitation Services Office [phone]    [name] [name] 02/25/2023, 7:00 AM

## 2023-02-26 ENCOUNTER — Other Ambulatory Visit: Payer: Medicare Other

## 2023-02-26 LAB — IGG 4: IgG, Subclass 4: 26 mg/dL (ref 2–96)

## 2023-02-26 LAB — ANA W/REFLEX IF POSITIVE: Anti Nuclear Antibody (ANA): NEGATIVE

## 2023-02-27 LAB — IGG: IgG (Immunoglobin G), Serum: 659 mg/dL (ref 603–1613)

## 2023-03-06 ENCOUNTER — Ambulatory Visit: Payer: Medicare Other | Admitting: Physician Assistant

## 2023-03-06 ENCOUNTER — Encounter: Payer: Self-pay | Admitting: Physician Assistant

## 2023-03-06 ENCOUNTER — Other Ambulatory Visit (INDEPENDENT_AMBULATORY_CARE_PROVIDER_SITE_OTHER): Payer: Medicare Other

## 2023-03-06 VITALS — BP 157/79 | HR 92 | Ht 65.0 in | Wt 191.0 lb

## 2023-03-06 DIAGNOSIS — R413 Other amnesia: Secondary | ICD-10-CM

## 2023-03-06 LAB — TSH: TSH: 0.71 u[IU]/mL (ref 0.35–5.50)

## 2023-03-06 LAB — VITAMIN B12: Vitamin B-12: 1018 pg/mL — ABNORMAL HIGH (ref 211–911)

## 2023-03-06 MED ORDER — MEMANTINE HCL 5 MG PO TABS: ORAL_TABLET | ORAL | 11 refills | Status: DC

## 2023-03-06 NOTE — Patient Instructions (Addendum)
It was a pleasure to see you today at our office.   Recommendations:  Neurocognitive evaluation at our office    Start Memantine 5 mg: Take 1 tablet (5 mg at night) for 2 weeks, then increase to 1 tablet (5 mg) twice a day   MRI of the brain, the radiology office will call you to arrange you appointment   Check labs today   Follow up in  May 20 at 11 am     For assessment of decision of mental capacity and competency:  Call Dr. Erick Blinks, geriatric psychiatrist at [phone] Counseling regarding caregiver distress, including caregiver depression, anxiety and issues regarding community resources, adult day care programs, adult living facilities, or memory care questions:  please contact your  Primary Doctor's Social Worker  Whom to call: Memory  decline, memory medications: Call our office [phone]  For psychiatric meds, mood meds: Please have your primary care physician manage these medications.  If you have any severe symptoms of a stroke, or other severe issues such as confusion,severe chills or fever, etc call 911 or go to the ER as you may need to be evaluated further    RECOMMENDATIONS FOR ALL PATIENTS WITH MEMORY PROBLEMS: 1. Continue to exercise (Recommend 30 minutes of walking everyday, or 3 hours every week) 2. Increase social interactions - continue going to [place] and enjoy social gatherings with friends and family 3. Eat healthy, avoid fried foods and eat more fruits and vegetables 4. Maintain adequate blood pressure, blood sugar, and blood cholesterol level. Reducing the risk of stroke and cardiovascular disease also helps promoting better memory. 5. Avoid stressful situations. Live a simple life and avoid aggravations. Organize your time and prepare for the next day in anticipation. 6. Sleep well, avoid any interruptions of sleep and avoid any distractions in the bedroom that may interfere with adequate sleep quality 7. Avoid sugar, avoid sweets as there is a  strong link between excessive sugar intake, diabetes, and cognitive impairment We discussed the Mediterranean diet, which has been shown to help patients reduce the risk of progressive memory disorders and reduces cardiovascular risk. This includes eating fish, eat fruits and green leafy vegetables, nuts like almonds and hazelnuts, walnuts, and also use olive oil. Avoid fast foods and fried foods as much as possible. Avoid sweets and sugar as sugar use has been linked to worsening of memory function.  There is always a concern of gradual progression of memory problems. If this is the case, then we may need to adjust level of care according to patient needs. Support, both to the patient and caregiver, should then be put into place.      You have been referred for a neuropsychological evaluation (i.e., evaluation of memory and thinking abilities). Please bring someone with you to this appointment if possible, as it is helpful for the doctor to hear from both you and another adult who knows you well. Please bring eyeglasses and hearing aids if you wear them.    The evaluation will take approximately 3 hours and has two parts:   The first part is a clinical interview with the neuropsychologist (Dr. Milbert Coulter or Dr. Roseanne Reno). During the interview, the neuropsychologist will speak with you and the individual you brought to the appointment.    The second part of the evaluation is testing with the doctor's technician [name] or Selena Batten). During the testing, the technician will ask you to remember different types of material, solve problems, and answer some questionnaires. Your family  member will not be present for this portion of the evaluation.   Please note: We must reserve several hours of the neuropsychologist's time and the psychometrician's time for your evaluation appointment. As such, there is a No-Show fee of $100. If you are unable to attend any of your appointments, please contact our office as soon as  possible to reschedule.    FALL PRECAUTIONS: Be cautious when walking. Scan the area for obstacles that may increase the risk of trips and falls. When getting up in the mornings, sit up at the edge of the bed for a few minutes before getting out of bed. Consider elevating the bed at the head end to avoid drop of blood pressure when getting up. Walk always in a well-lit room (use night lights in the walls). Avoid area rugs or power cords from appliances in the middle of the walkways. Use a walker or a cane if necessary and consider physical therapy for balance exercise. Get your eyesight checked regularly.  FINANCIAL OVERSIGHT: Supervision, especially oversight when making financial decisions or transactions is also recommended.  HOME SAFETY: Consider the safety of the kitchen when operating appliances like stoves, microwave oven, and blender. Consider having supervision and share cooking responsibilities until no longer able to participate in those. Accidents with firearms and other hazards in the house should be identified and addressed as well.   ABILITY TO BE LEFT ALONE: If patient is unable to contact 911 operator, consider using LifeLine, or when the need is there, arrange for someone to stay with patients. Smoking is a fire hazard, consider supervision or cessation. Risk of wandering should be assessed by caregiver and if detected at any point, supervision and safe proof recommendations should be instituted.  MEDICATION SUPERVISION: Inability to self-administer medication needs to be constantly addressed. Implement a mechanism to ensure safe administration of the medications.   DRIVING: Regarding driving, in patients with progressive memory problems, driving will be impaired. We advise to have someone else do the driving if trouble finding directions or if minor accidents are reported. Independent driving assessment is available to determine safety of driving.   If you are interested in the  driving assessment, you can contact the following:  [employer] in [place] [phone]  [facility] [phone]  [facility] [phone] or [phone]    Mediterranean Diet A Mediterranean diet refers to food and lifestyle choices that are based on the traditions of countries located on the [employer]. This way of eating has been shown to help prevent certain conditions and improve outcomes for people who have chronic diseases, like kidney disease and heart disease. What are tips for following this plan? Lifestyle  Cook and eat meals together with your family, when possible. Drink enough fluid to keep your urine clear or pale yellow. Be physically active every day. This includes: Aerobic exercise like running or swimming. Leisure activities like gardening, walking, or housework. Get 7-8 hours of sleep each night. If recommended by your health care provider, drink red wine in moderation. This means 1 glass a day for nonpregnant women and 2 glasses a day for men. A glass of wine equals 5 oz (150 mL). Reading food labels  Check the serving size of packaged foods. For foods such as rice and pasta, the serving size refers to the amount of cooked product, not dry. Check the total fat in packaged foods. Avoid foods that have saturated fat or trans fats. Check the ingredients  list for added sugars, such as corn syrup. Shopping  At the grocery store, buy most of your food from the areas near the walls of the store. This includes: Fresh fruits and vegetables (produce). Grains, beans, nuts, and seeds. Some of these may be available in unpackaged forms or large amounts (in bulk). Fresh seafood. Poultry and eggs. Low-fat dairy products. Buy whole ingredients instead of prepackaged foods. Buy fresh fruits and vegetables in-season from local farmers markets. Buy frozen fruits and vegetables in resealable  bags. If you do not have access to quality fresh seafood, buy precooked frozen shrimp or canned fish, such as tuna, salmon, or sardines. Buy small amounts of raw or cooked vegetables, salads, or olives from the deli or salad bar at your store. Stock your pantry so you always have certain foods on hand, such as olive oil, canned tuna, canned tomatoes, rice, pasta, and beans. Cooking  Cook foods with extra-virgin olive oil instead of using butter or other vegetable oils. Have meat as a side dish, and have vegetables or grains as your main dish. This means having meat in small portions or adding small amounts of meat to foods like pasta or stew. Use beans or vegetables instead of meat in common dishes like chili or lasagna. Experiment with different cooking methods. Try roasting or broiling vegetables instead of steaming or sauteing them. Add frozen vegetables to soups, stews, pasta, or rice. Add nuts or seeds for added healthy fat at each meal. You can add these to yogurt, salads, or vegetable dishes. Marinate fish or vegetables using olive oil, lemon juice, garlic, and fresh herbs. Meal planning  Plan to eat 1 vegetarian meal one day each week. Try to work up to 2 vegetarian meals, if possible. Eat seafood 2 or more times a week. Have healthy snacks readily available, such as: Vegetable sticks with hummus. Greek yogurt. Fruit and nut trail mix. Eat balanced meals throughout the week. This includes: Fruit: 2-3 servings a day Vegetables: 4-5 servings a day Low-fat dairy: 2 servings a day Fish, poultry, or lean meat: 1 serving a day Beans and legumes: 2 or more servings a week Nuts and seeds: 1-2 servings a day Whole grains: 6-8 servings a day Extra-virgin olive oil: 3-4 servings a day Limit red meat and sweets to only a few servings a month What are my food choices? Mediterranean diet Recommended Grains: Whole-grain pasta. Brown rice. Bulgar wheat. Polenta. Couscous. Whole-wheat bread.  Orpah Cobb. Vegetables: Artichokes. Beets. Broccoli. Cabbage. Carrots. Eggplant. Green beans. Chard. Kale. Spinach. Onions. Leeks. Peas. Squash. Tomatoes. Peppers. Radishes. Fruits: Apples. Apricots. Avocado. Berries. Bananas. Cherries. Dates. Figs. Grapes. Lemons. Melon. Oranges. Peaches. Plums. Pomegranate. Meats and other protein foods: Beans. Almonds. Sunflower seeds. Pine nuts. Peanuts. Cod. Salmon. Scallops. Shrimp. Tuna. Tilapia. Clams. Oysters. Eggs. Dairy: Low-fat milk. Cheese. Greek yogurt. Beverages: Water. Red wine. Herbal tea. Fats and oils: Extra virgin olive oil. Avocado oil. Grape seed oil. Sweets and desserts: [place] yogurt with honey. Baked apples. Poached pears. Trail mix. Seasoning and other foods: Basil. Cilantro. Coriander. Cumin. Mint. Parsley. Sage. Rosemary. Tarragon. Garlic. Oregano. Thyme. Pepper. Balsalmic vinegar. Tahini. Hummus. Tomato sauce. Olives. Mushrooms. Limit these Grains: Prepackaged pasta or rice dishes. Prepackaged cereal with added sugar. Vegetables: Deep fried potatoes (french fries). Fruits: Fruit canned in syrup. Meats and other protein foods: Beef. Pork. Lamb. Poultry with skin. Hot dogs. Tomasa Blase. Dairy: Ice cream. Sour cream. Whole milk. Beverages: Juice. Sugar-sweetened soft drinks. Beer. Liquor and spirits. Fats and oils: Butter. Canola oil.  Vegetable oil. Beef fat (tallow). Lard. Sweets and desserts: Cookies. Cakes. Pies. Candy. Seasoning and other foods: Mayonnaise. Premade sauces and marinades. The items listed may not be a complete list. Talk with your dietitian about what dietary choices are right for you. Summary The Mediterranean diet includes both food and lifestyle choices. Eat a variety of fresh fruits and vegetables, beans, nuts, seeds, and whole grains. Limit the amount of red meat and sweets that you eat. Talk with your health care provider about whether it is safe for you to drink red wine in moderation. This means 1 glass a day  for nonpregnant women and 2 glasses a day for men. A glass of wine equals 5 oz (150 mL). This information is not intended to replace advice given to you by your health care provider. Make sure you discuss any questions you have with your health care provider. Document Released: 06/15/2016 Document Revised: 07/18/2016 Document Reviewed: 06/15/2016 Elsevier Interactive Patient Education  2017 [employer].

## 2023-03-06 NOTE — Progress Notes (Signed)
B12 and thyroid are normal. Thanks
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
## 2023-03-22 ENCOUNTER — Telehealth: Payer: Self-pay | Admitting: Cardiovascular Disease

## 2023-03-22 NOTE — Telephone Encounter (Signed)
Pt c/o Shortness Of Breath: STAT if SOB developed within the last 24 hours or pt is noticeably SOB on the phone  1. Are you currently SOB (can you hear that pt is SOB on the phone)? No, s/w pt's spouse  2. How long have you been experiencing SOB? A few weeks  3. Are you SOB when sitting or when up moving around? Moving around  4. Are you currently experiencing any other symptoms? Patient's spouse is calling stated that patient is having trouble breathing. Patient's spouse stated the patient can walk a very short distance and will be out of breath and feel a little bit of dizziness. Patient's spouse stated that the patient saw Dr. Bosie Clos on 05/13 and recommended them to reach out to the cardiologist. Please advise.

## 2023-03-22 NOTE — Telephone Encounter (Signed)
I spoke with the patient and his wife.  He said he gets up and walks just a short distance and he can't breathe, his legs get weak and he needs to sit down.  No chest pain or heaviness. Denies swelling. No current BPs or HRs  Symptoms are intermittent.  Sometimes he can walk to mailbox and back and feel fine.  Other times just 40 or so feet and he gets the SOB and weak in legs. Had endoscopy today w [facility], Dr. Bosie Clos.  Dr. Bosie Clos recommended he follow up with cardiologist.  His wife is worried about him lately.  He doesn't act like himself at all.  He has zero energy.  He has known memory issues.  He has a follow up in neurology on 03/25/21.  Hospitalized last month for pancreatitis.  Symptoms were occurring prior to that.  I have scheduled him with Dr. Clifton James on 03/28/23.

## 2023-03-26 ENCOUNTER — Ambulatory Visit: Payer: Medicare Other | Admitting: Physician Assistant

## 2023-03-28 ENCOUNTER — Encounter: Payer: Self-pay | Admitting: *Deleted

## 2023-03-28 ENCOUNTER — Ambulatory Visit: Payer: Medicare Other | Attending: Cardiovascular Disease | Admitting: Cardiovascular Disease

## 2023-03-28 VITALS — BP 128/74 | HR 65 | Ht 65.0 in | Wt 191.6 lb

## 2023-03-28 DIAGNOSIS — I25118 Atherosclerotic heart disease of native coronary artery with other forms of angina pectoris: Secondary | ICD-10-CM

## 2023-03-28 DIAGNOSIS — I10 Essential (primary) hypertension: Secondary | ICD-10-CM | POA: Diagnosis not present

## 2023-03-28 DIAGNOSIS — E785 Hyperlipidemia, unspecified: Secondary | ICD-10-CM

## 2023-03-28 DIAGNOSIS — I493 Ventricular premature depolarization: Secondary | ICD-10-CM

## 2023-03-28 DIAGNOSIS — R0602 Shortness of breath: Secondary | ICD-10-CM
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
## 2023-03-28 NOTE — Patient Instructions (Signed)
Medication Instructions:  No changes *If you need a refill on your cardiac medications before your next appointment, please call your pharmacy*   Lab Work: none   Testing/Procedures: Your physician has requested that you have an echocardiogram. Echocardiography is a painless test that uses sound waves to create images of your heart. It provides your doctor with information about the size and shape of your heart and how well your heart's chambers and valves are working. This procedure takes approximately one hour. There are no restrictions for this procedure. Please do NOT wear cologne, perfume, aftershave, or lotions (deodorant is allowed). Please arrive 15 minutes prior to your appointment time.  Your physician has requested that you have a lexiscan myoview. For further information please visit [email or web address] Please follow instruction sheet, as given.   Follow-Up: At [facility], you and your health needs are our priority.  As part of our continuing mission to provide you with exceptional heart care, we have created designated Provider Care Teams.  These Care Teams include your primary Cardiologist (physician) and Advanced Practice Providers (APPs -  Physician Assistants and Nurse Practitioners) who all work together to provide you with the care you need, when you need it.   Your next appointment:   12 month(s)  Provider:   Verne Carrow, MD

## 2023-04-09 ENCOUNTER — Encounter: Payer: Self-pay | Admitting: Neurology

## 2023-04-12 ENCOUNTER — Encounter: Payer: Self-pay | Admitting: Neurology

## 2023-04-19 ENCOUNTER — Encounter (HOSPITAL_COMMUNITY): Payer: Self-pay

## 2023-04-19 ENCOUNTER — Ambulatory Visit
Admission: RE | Admit: 2023-04-19 | Discharge: 2023-04-19 | Disposition: A | Discharge status: Home / Self Care | Payer: Medicare Other | Source: Ambulatory Visit | Attending: Physician Assistant | Admitting: Physician Assistant

## 2023-04-19 ENCOUNTER — Encounter: Payer: Self-pay | Admitting: Physician Assistant

## 2023-04-19 ENCOUNTER — Other Ambulatory Visit: Payer: Self-pay | Admitting: Physician Assistant

## 2023-04-19 ENCOUNTER — Observation Stay (HOSPITAL_COMMUNITY)
Admission: EM | Admit: 2023-04-19 | Discharge: 2023-04-20 | Disposition: A | Discharge status: Home / Self Care | Payer: Medicare Other | Attending: Internal Medicine | Admitting: Internal Medicine

## 2023-04-19 ENCOUNTER — Other Ambulatory Visit: Payer: Self-pay

## 2023-04-19 DIAGNOSIS — E119 Type 2 diabetes mellitus without complications: Secondary | ICD-10-CM

## 2023-04-19 DIAGNOSIS — I25810 Atherosclerosis of coronary artery bypass graft(s) without angina pectoris: Secondary | ICD-10-CM | POA: Diagnosis present

## 2023-04-19 DIAGNOSIS — C186 Malignant neoplasm of descending colon: Secondary | ICD-10-CM | POA: Diagnosis present

## 2023-04-19 DIAGNOSIS — G9389 Other specified disorders of brain: Principal | ICD-10-CM

## 2023-04-19 DIAGNOSIS — Z951 Presence of aortocoronary bypass graft: Secondary | ICD-10-CM | POA: Diagnosis not present

## 2023-04-19 DIAGNOSIS — G9341 Metabolic encephalopathy: Secondary | ICD-10-CM | POA: Diagnosis not present

## 2023-04-19 DIAGNOSIS — E039 Hypothyroidism, unspecified: Secondary | ICD-10-CM | POA: Diagnosis not present

## 2023-04-19 DIAGNOSIS — Z79899 Other long term (current) drug therapy: Secondary | ICD-10-CM | POA: Insufficient documentation

## 2023-04-19 DIAGNOSIS — Z87891 Personal history of nicotine dependence: Secondary | ICD-10-CM | POA: Diagnosis not present

## 2023-04-19 DIAGNOSIS — Z85038 Personal history of other malignant neoplasm of large intestine: Secondary | ICD-10-CM | POA: Insufficient documentation

## 2023-04-19 DIAGNOSIS — R413 Other amnesia: Secondary | ICD-10-CM

## 2023-04-19 DIAGNOSIS — R7989 Other specified abnormal findings of blood chemistry: Secondary | ICD-10-CM | POA: Insufficient documentation

## 2023-04-19 DIAGNOSIS — R220 Localized swelling, mass and lump, head: Principal | ICD-10-CM | POA: Insufficient documentation

## 2023-04-19 DIAGNOSIS — I10 Essential (primary) hypertension: Secondary | ICD-10-CM | POA: Diagnosis not present

## 2023-04-19 DIAGNOSIS — R531 Weakness: Secondary | ICD-10-CM | POA: Diagnosis present

## 2023-04-19 DIAGNOSIS — Z7982 Long term (current) use of aspirin: Secondary | ICD-10-CM | POA: Diagnosis not present

## 2023-04-19 DIAGNOSIS — Z7984 Long term (current) use of oral hypoglycemic drugs: Secondary | ICD-10-CM | POA: Insufficient documentation

## 2023-04-19 DIAGNOSIS — R1310 Dysphagia, unspecified: Secondary | ICD-10-CM

## 2023-04-19 DIAGNOSIS — E8809 Other disorders of plasma-protein metabolism, not elsewhere classified: Secondary | ICD-10-CM | POA: Diagnosis not present

## 2023-04-19 DIAGNOSIS — I2510 Atherosclerotic heart disease of native coronary artery without angina pectoris: Secondary | ICD-10-CM | POA: Diagnosis not present

## 2023-04-19 DIAGNOSIS — E785 Hyperlipidemia, unspecified: Secondary | ICD-10-CM | POA: Diagnosis not present

## 2023-04-19 LAB — CBC WITH DIFFERENTIAL/PLATELET
Abs Immature Granulocytes: 0.02 10*3/uL (ref 0.00–0.07)
Basophils Absolute: 0 10*3/uL (ref 0.0–0.1)
Basophils Relative: 1 %
Eosinophils Absolute: 0.2 10*3/uL (ref 0.0–0.5)
Eosinophils Relative: 2 %
HCT: 46.6 % (ref 39.0–52.0)
Hemoglobin: 15.1 g/dL (ref 13.0–17.0)
Immature Granulocytes: 0 %
Lymphocytes Relative: 18 %
Lymphs Abs: 1.3 10*3/uL (ref 0.7–4.0)
MCH: 31.7 pg (ref 26.0–34.0)
MCHC: 32.4 g/dL (ref 30.0–36.0)
MCV: 97.9 fL (ref 80.0–100.0)
Monocytes Absolute: 0.6 10*3/uL (ref 0.1–1.0)
Monocytes Relative: 9 %
Neutro Abs: 5.2 10*3/uL (ref 1.7–7.7)
Neutrophils Relative %: 70 %
Platelets: 130 10*3/uL — ABNORMAL LOW (ref 150–400)
RBC: 4.76 MIL/uL (ref 4.22–5.81)
RDW: 12.5 % (ref 11.5–15.5)
WBC: 7.3 10*3/uL (ref 4.0–10.5)
nRBC: 0 % (ref 0.0–0.2)

## 2023-04-19 LAB — COMPREHENSIVE METABOLIC PANEL
ALT: 59 U/L — ABNORMAL HIGH (ref 0–44)
AST: 54 U/L — ABNORMAL HIGH (ref 15–41)
Albumin: 3.5 g/dL (ref 3.5–5.0)
Alkaline Phosphatase: 64 U/L (ref 38–126)
Anion gap: 9 (ref 5–15)
BUN: 11 mg/dL (ref 8–23)
CO2: 25 mmol/L (ref 22–32)
Calcium: 9.1 mg/dL (ref 8.9–10.3)
Chloride: 106 mmol/L (ref 98–111)
Creatinine, Ser: 1.12 mg/dL (ref 0.61–1.24)
GFR, Estimated: >60 mL/min (ref >60)
Glucose, Bld: 221 mg/dL — ABNORMAL HIGH (ref 70–99)
Potassium: 4.5 mmol/L (ref 3.5–5.1)
Sodium: 140 mmol/L (ref 135–145)
Total Bilirubin: 0.9 mg/dL (ref 0.3–1.2)
Total Protein: 6.2 g/dL — ABNORMAL LOW (ref 6.5–8.1)

## 2023-04-19 LAB — CBG MONITORING, ED: Glucose-Capillary: 130 mg/dL — ABNORMAL HIGH (ref 70–99)

## 2023-04-19 LAB — GLUCOSE, CAPILLARY: Glucose-Capillary: 161 mg/dL — ABNORMAL HIGH (ref 70–99)

## 2023-04-19 MED ORDER — INSULIN ASPART 100 UNIT/ML IJ SOLN
0.0000 [IU] | Freq: Three times a day (TID) | INTRAMUSCULAR | Status: DC
  Administered 2023-04-20 (×2): 5 [IU] via SUBCUTANEOUS

## 2023-04-19 MED ORDER — INSULIN DETEMIR 100 UNIT/ML ~~LOC~~ SOLN
5.0000 [IU] | Freq: Every day | SUBCUTANEOUS | Status: DC
  Administered 2023-04-20: 5 [IU] via SUBCUTANEOUS
  Filled 2023-04-19 (×2): qty 0.05

## 2023-04-19 MED ORDER — ONDANSETRON HCL 4 MG/2ML IJ SOLN: 4.0000 mg | Freq: Four times a day (QID) | INTRAMUSCULAR | Status: DC | PRN

## 2023-04-19 MED ORDER — LEVOTHYROXINE SODIUM 75 MCG PO TABS
75.0000 ug | ORAL_TABLET | Freq: Every morning | ORAL | Status: DC
  Filled 2023-04-19: qty 1

## 2023-04-19 MED ORDER — DEXAMETHASONE SODIUM PHOSPHATE 4 MG/ML IJ SOLN
4.0000 mg | Freq: Four times a day (QID) | INTRAMUSCULAR | Status: DC
  Administered 2023-04-20 (×3): 4 mg via INTRAVENOUS
  Filled 2023-04-19 (×7): qty 1

## 2023-04-19 MED ORDER — PANTOPRAZOLE SODIUM 40 MG PO TBEC: 40.0000 mg | DELAYED_RELEASE_TABLET | Freq: Every day | ORAL | Status: DC

## 2023-04-19 MED ORDER — GADOPICLENOL 0.5 MMOL/ML IV SOLN
8.5000 mL | Freq: Once | INTRAVENOUS | Status: AC | PRN
  Administered 2023-04-19: 8.5 mL via INTRAVENOUS

## 2023-04-19 MED ORDER — MEMANTINE HCL 5 MG PO TABS
5.0000 mg | ORAL_TABLET | Freq: Two times a day (BID) | ORAL | Status: DC
  Administered 2023-04-19: 5 mg via ORAL
  Filled 2023-04-19 (×3): qty 1

## 2023-04-19 MED ORDER — INSULIN ASPART 100 UNIT/ML IJ SOLN: 0.0000 [IU] | Freq: Every day | INTRAMUSCULAR | Status: DC

## 2023-04-19 MED ORDER — SENNOSIDES-DOCUSATE SODIUM 8.6-50 MG PO TABS: 1.0000 | ORAL_TABLET | Freq: Every evening | ORAL | Status: DC | PRN

## 2023-04-19 MED ORDER — DEXAMETHASONE 4 MG PO TABS: 4.0000 mg | ORAL_TABLET | Freq: Once | ORAL | Status: DC

## 2023-04-19 MED ORDER — ACETAMINOPHEN 325 MG PO TABS: 650.0000 mg | ORAL_TABLET | Freq: Four times a day (QID) | ORAL | Status: DC | PRN

## 2023-04-19 MED ORDER — FESOTERODINE FUMARATE ER 4 MG PO TB24
4.0000 mg | ORAL_TABLET | Freq: Every day | ORAL | Status: DC
  Filled 2023-04-19: qty 1

## 2023-04-19 MED ORDER — METOPROLOL TARTRATE 12.5 MG HALF TABLET: 12.5000 mg | ORAL_TABLET | Freq: Every day | ORAL | Status: DC

## 2023-04-19 MED ORDER — ONDANSETRON HCL 4 MG PO TABS: 4.0000 mg | ORAL_TABLET | Freq: Four times a day (QID) | ORAL | Status: DC | PRN

## 2023-04-19 MED ORDER — ATORVASTATIN CALCIUM 10 MG PO TABS
10.0000 mg | ORAL_TABLET | Freq: Every day | ORAL | Status: DC
  Administered 2023-04-19: 10 mg via ORAL
  Filled 2023-04-19: qty 1

## 2023-04-19 MED ORDER — ACETAMINOPHEN 650 MG RE SUPP: 650.0000 mg | Freq: Four times a day (QID) | RECTAL | Status: DC | PRN

## 2023-04-19 NOTE — ED Notes (Signed)
Judy(wife) [phone] requesting a call to known new room# & location if gets moved tonight

## 2023-04-19 NOTE — Progress Notes (Signed)
Spoke with Dr. Phillips Odor, Radiology at [facility] regarding abnormal MRI findings. MRI brain  wo contrast revealed a large anterior left frontal lobe lesion with underlying vasogenic edema and pushing the ventricle. Unclear if meningioma or other etiology. Patient has a history of colon cancer in the past.   Patient was sent to the ED from [facility]. Appreciate Radiology call.
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
## 2023-04-19 NOTE — ED Triage Notes (Signed)
Pt was sent by PCP for abnormal MRI of the brain. Pt got the MRI for recent memory loss

## 2023-04-19 NOTE — ED Provider Notes (Signed)
[name] [name] EMERGENCY DEPARTMENT AT [facility] Provider Note   CSN: [id number] Arrival date & time: 04/19/23  1509     History  Chief Complaint  Patient presents with   abnormal MRI    [name] [name] is a 77 y.o. male.  HPI Patient presents with concern of worsening confusion, weakness.  He is assisted by his wife who provides much of the history. Patient has had ongoing evaluation for his weakness for some time, today had MRI provided as an outpatient, and results were abnormal.  He was encouraged to come here for evaluation. He denies focal weakness, states that states that he feels lower extremity weakness more than upper, has had not had a fall today or recently. Patient is cognitive decline inhibits the history taking somewhat, level 5 caveat.    Home Medications Prior to Admission medications   Medication Sig Start Date End Date Taking? Authorizing Provider  aspirin 81 MG tablet Take 81 mg by mouth daily.    [provider]  atorvastatin (LIPITOR) 10 MG tablet Take 10 mg by mouth at bedtime.  02/22/15   [provider]  Cholecalciferol (VITAMIN D3) 1000 units CAPS Take 2,000 Units by mouth daily.    [provider]  glipiZIDE (GLUCOTROL XL) 10 MG 24 hr tablet Take 20 mg by mouth daily. 01/17/23   [provider]  levothyroxine (SYNTHROID) 75 MCG tablet Take 75 mcg by mouth every morning. 01/17/23   [provider]  memantine (NAMENDA) 5 MG tablet Take 1 tablet (5 mg at night) for 2 weeks, then increase to 1 tablet (5 mg) twice a day 03/06/23   Marcos Eke, PA-C  metFORMIN (GLUCOPHAGE) 1000 MG tablet Take 1,000 mg by mouth 2 (two) times daily. 09/02/18   [provider]  metoprolol tartrate (LOPRESSOR) 25 MG tablet Take 12.5-25 mg by mouth daily at 6 (six) AM. Take 25 mg (1 tablet) by mouth every morning, and 12.5 mg (1/2 tablet) at bedtime.    [provider]  nitroGLYCERIN (NITROSTAT) 0.4 MG SL tablet  Place 1 tablet (0.4 mg total) under the tongue every 5 (five) minutes as needed for chest pain. 06/24/18   Kathleene Hazel, MD  ondansetron (ZOFRAN) 4 MG tablet Take 1 tablet (4 mg total) by mouth every 6 (six) hours as needed for nausea. 02/25/23   Glade Lloyd, MD  pantoprazole (PROTONIX) 40 MG tablet Take 1 tablet (40 mg total) by mouth 2 (two) times daily before a meal. Patient taking differently: Take 40 mg by mouth daily. 02/25/23   Glade Lloyd, MD  polyethylene glycol (MIRALAX / GLYCOLAX) 17 g packet Take 17 g by mouth daily as needed for mild constipation. 02/25/23   Glade Lloyd, MD  solifenacin (VESICARE) 10 MG tablet Take 10 mg by mouth daily. 02/05/23   [provider]  traMADol (ULTRAM) 50 MG tablet Take 1 tablet (50 mg total) by mouth every 6 (six) hours as needed. 02/25/23 02/25/24  Glade Lloyd, MD      Allergies    Patient has no known allergies.    Review of Systems   Review of Systems  Unable to perform ROS: Other    Physical Exam Updated Vital Signs BP (!) 169/76   Pulse (!) 56   Temp 98 F (36.7 C)   Resp 16   Ht 5\' 5"  (1.651 m)   Wt 86.9 kg   SpO2 100%   BMI 31.88 kg/m  Physical Exam Vitals and nursing  note reviewed.  Constitutional:      General: He is not in acute distress.    Appearance: He is well-developed.     Comments: Withdrawn elderly male in no distress  HENT:     Head: Normocephalic and atraumatic.  Eyes:     Conjunctiva/sclera: Conjunctivae normal.  Cardiovascular:     Rate and Rhythm: Normal rate and regular rhythm.  Pulmonary:     Effort: Pulmonary effort is normal. No respiratory distress.     Breath sounds: No stridor.  Abdominal:     General: There is no distension.  Skin:    General: Skin is warm and dry.  Neurological:     Mental Status: He is alert.     Motor: Atrophy present. No tremor or abnormal muscle tone.     Comments: Patient follows commands appropriately, has age-appropriate atrophy, no discernible  focal abnormality  Psychiatric:        Cognition and Memory: Cognition is impaired. Memory is impaired.     ED Results / Procedures / Treatments   Labs (all labs ordered are listed, but only abnormal results are displayed) Labs Reviewed  COMPREHENSIVE METABOLIC PANEL - Abnormal; Notable for the following components:      Result Value   Glucose, Bld 221 (*)    Total Protein 6.2 (*)    AST 54 (*)    ALT 59 (*)    All other components within normal limits  CBC WITH DIFFERENTIAL/PLATELET - Abnormal; Notable for the following components:   Platelets 130 (*)    All other components within normal limits    EKG None  Radiology MR BRAIN W WO CONTRAST  Result Date: 04/19/2023 CLINICAL DATA:  Provided history: Memory impairment. Additional history provided by the scanning technologist: Confusion, memory loss, history of colon cancer. EXAM: MRI HEAD WITHOUT AND WITH CONTRAST TECHNIQUE: Multiplanar, multiecho pulse sequences of the brain and surrounding structures were obtained without and with intravenous contrast. CONTRAST:  8.5 mL Vueway intravenous contrast. COMPARISON:  No pertinent prior exams available for comparison. FINDINGS: Brain: Mild generalized parenchymal atrophy. Somewhat heterogeneously enhancing extra-axial dural-based mass overlying the left greater than right anterior frontal lobes. The dominant component of the mass to the left of midline measures 4.7 x 3.8 x 5.6 cm. The dominant component of the mass to the right of midline measures 1.7 x 1.0 x 3.5 cm. There is contiguous dural thickening and enhancement extending inferiorly and posteriorly along the falx and along the bilateral frontal lobes. The constellation of findings is favored to reflect a meningioma. The mass invades the anterior third of the superior sagittal sinus. There is prominent local mass effect upon the anterior aspect of the left frontal lobe with partial effacement of the frontal horns of both lateral  ventricles. Posterior displacement of the anterior body and genu of the corpus callosum (left greater than right). Rightward bowing of the anterior falx. Moderate vasogenic edema within the anterior left frontal lobe underlying the mass. Punctate chronic microhemorrhage within the right cerebellar hemisphere. There is no acute infarct. No extra-axial fluid collection. No midline shift. Vascular: Maintained flow voids within the proximal large arterial vessels. Skull and upper cervical spine: Abnormal T1 hypointense marrow signal within portions of the frontal calvarium overlying the mass, favored to reflect hyperostosis. Susceptibility artifact arising from ACDF hardware. Incompletely assessed cervical spondylosis. Sinuses/Orbits: No mass or acute finding within the imaged orbits. Trace mucosal thickening within the left maxillary sinus. Impression #1 called by telephone at the time of  interpretation on 04/19/2023 at 3:50 pm to provider SARA [facility] , who verbally acknowledged these results. Per the provider's instruction, the patient was sent from the imaging center to the emergency department for further evaluation. IMPRESSION: 1. Extra-axial dural-based mass overlying the left greater than right anterior frontal lobes. Contiguous dural thickening and enhancement extending inferiorly and posteriorly along the falx, and along the bilateral frontal lobes. The constellation of findings is favored to reflect a meningioma over dural-based metastatic disease. The mass invades the anterior third of the superior sagittal sinus. Mass effect upon the underlying brain parenchyma as described. Moderate vasogenic edema within the underlying left frontal lobe. 2. Mild generalized parenchymal atrophy. Electronically Signed   By: Jackey Loge D.O.   On: 04/19/2023 15:53    Procedures Procedures    Medications Ordered in ED Medications  dexamethasone (DECADRON) tablet 4 mg (has no administration in time range)    ED  Course/ Medical Decision Making/ A&P                             Medical Decision Making Elderly male presents with worsening weakness, fatigue, report of outside test that was abnormal. Broad differential including infection, cognitive decline, dehydration, mass considered and on reviewing his outside MRI, is clear the patient is newly identified mass in the left frontal region with mass effect. After my initial evaluation, review of his labs, MRI, demonstration of the MRI to the patient and his wife at bedside I discussed his case with our neurosurgery colleagues. Patient will start steroids, will be seen by our neurosurgery colleagues in the morning for planned surgical resection.  Amount and/or Complexity of Data Reviewed Independent Historian: spouse External Data Reviewed: notes. Labs: ordered. Decision-making details documented in ED Course. Radiology: independent interpretation performed. Decision-making details documented in ED Course.  Risk Prescription drug management. Decision regarding hospitalization. Diagnosis or treatment significantly limited by social determinants of health.  Final Clinical Impression(s) / ED Diagnoses Final diagnoses:  Brain mass    Rx / DC Orders ED Discharge Orders     None         Gerhard Munch, MD 04/19/23 2013

## 2023-04-19 NOTE — Progress Notes (Signed)
Case d/w Dr. Robet Leu. [place]. Pt presented to ED after abnormal MRI results, and increased confusion recently. MRI showing extra-axial dural based mass overlying L>R frontal lobes, favored to be meningioma with surrounding vasogenic edema. Recommend medical admit and Decadron 4mg  Q6. NSGY to consult.   Call w/ questions/concerns.  Patrici Ranks, PAC.
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
## 2023-04-20 ENCOUNTER — Inpatient Hospital Stay (HOSPITAL_COMMUNITY): Payer: Medicare Other

## 2023-04-20 ENCOUNTER — Ambulatory Visit (INDEPENDENT_AMBULATORY_CARE_PROVIDER_SITE_OTHER): Payer: Medicare Other | Admitting: Psychology

## 2023-04-20 ENCOUNTER — Ambulatory Visit: Payer: Self-pay

## 2023-04-20 DIAGNOSIS — R413 Other amnesia: Secondary | ICD-10-CM

## 2023-04-20 DIAGNOSIS — G9341 Metabolic encephalopathy: Secondary | ICD-10-CM | POA: Diagnosis not present

## 2023-04-20 DIAGNOSIS — G9389 Other specified disorders of brain: Secondary | ICD-10-CM | POA: Diagnosis not present

## 2023-04-20 DIAGNOSIS — C186 Malignant neoplasm of descending colon: Secondary | ICD-10-CM | POA: Diagnosis not present

## 2023-04-20 DIAGNOSIS — I25810 Atherosclerosis of coronary artery bypass graft(s) without angina pectoris: Secondary | ICD-10-CM

## 2023-04-20 DIAGNOSIS — E119 Type 2 diabetes mellitus without complications: Secondary | ICD-10-CM

## 2023-04-20 LAB — COMPREHENSIVE METABOLIC PANEL
ALT: 50 U/L — ABNORMAL HIGH (ref 0–44)
AST: 42 U/L — ABNORMAL HIGH (ref 15–41)
Albumin: 3.3 g/dL — ABNORMAL LOW (ref 3.5–5.0)
Alkaline Phosphatase: 65 U/L (ref 38–126)
Anion gap: 10 (ref 5–15)
BUN: 9 mg/dL (ref 8–23)
CO2: 23 mmol/L (ref 22–32)
Calcium: 9.3 mg/dL (ref 8.9–10.3)
Chloride: 105 mmol/L (ref 98–111)
Creatinine, Ser: 1 mg/dL (ref 0.61–1.24)
GFR, Estimated: >60 mL/min (ref >60)
Glucose, Bld: 170 mg/dL — ABNORMAL HIGH (ref 70–99)
Potassium: 3.8 mmol/L (ref 3.5–5.1)
Sodium: 138 mmol/L (ref 135–145)
Total Bilirubin: 0.9 mg/dL (ref 0.3–1.2)
Total Protein: 5.7 g/dL — ABNORMAL LOW (ref 6.5–8.1)

## 2023-04-20 LAB — CBC WITH DIFFERENTIAL/PLATELET
Abs Immature Granulocytes: 0.02 10*3/uL (ref 0.00–0.07)
Basophils Absolute: 0 10*3/uL (ref 0.0–0.1)
Basophils Relative: 0 %
Eosinophils Absolute: 0.2 10*3/uL (ref 0.0–0.5)
Eosinophils Relative: 3 %
HCT: 43.2 % (ref 39.0–52.0)
Hemoglobin: 14.5 g/dL (ref 13.0–17.0)
Immature Granulocytes: 0 %
Lymphocytes Relative: 21 %
Lymphs Abs: 1.8 10*3/uL (ref 0.7–4.0)
MCH: 32.7 pg (ref 26.0–34.0)
MCHC: 33.6 g/dL (ref 30.0–36.0)
MCV: 97.5 fL (ref 80.0–100.0)
Monocytes Absolute: 0.7 10*3/uL (ref 0.1–1.0)
Monocytes Relative: 9 %
Neutro Abs: 5.7 10*3/uL (ref 1.7–7.7)
Neutrophils Relative %: 67 %
Platelets: 133 10*3/uL — ABNORMAL LOW (ref 150–400)
RBC: 4.43 MIL/uL (ref 4.22–5.81)
RDW: 12.6 % (ref 11.5–15.5)
WBC: 8.4 10*3/uL (ref 4.0–10.5)
nRBC: 0 % (ref 0.0–0.2)

## 2023-04-20 LAB — MAGNESIUM: Magnesium: 1.5 mg/dL — ABNORMAL LOW (ref 1.7–2.4)

## 2023-04-20 LAB — GLUCOSE, CAPILLARY
Glucose-Capillary: 255 mg/dL — ABNORMAL HIGH (ref 70–99)
Glucose-Capillary: 238 mg/dL — ABNORMAL HIGH (ref 70–99)

## 2023-04-20 MED ORDER — IOHEXOL 350 MG/ML SOLN
50.0000 mL | Freq: Once | INTRAVENOUS | Status: AC | PRN
  Administered 2023-04-20: 50 mL via INTRAVENOUS

## 2023-04-20 MED ORDER — SENNOSIDES-DOCUSATE SODIUM 8.6-50 MG PO TABS: 1.0000 | ORAL_TABLET | Freq: Every evening | ORAL | 0 refills | Status: AC | PRN

## 2023-04-20 MED ORDER — INSULIN DETEMIR 100 UNIT/ML ~~LOC~~ SOLN
8.0000 [IU] | Freq: Every day | SUBCUTANEOUS | Status: DC
  Filled 2023-04-20: qty 0.08

## 2023-04-20 MED ORDER — ACETAMINOPHEN 325 MG PO TABS: 650.0000 mg | ORAL_TABLET | Freq: Four times a day (QID) | ORAL | 0 refills | Status: DC | PRN

## 2023-04-20 MED ORDER — DEXAMETHASONE 2 MG PO TABS: 2.0000 mg | ORAL_TABLET | Freq: Two times a day (BID) | ORAL | 0 refills | Status: DC

## 2023-04-20 MED ORDER — MAGNESIUM SULFATE 2 GM/50ML IV SOLN
2.0000 g | Freq: Once | INTRAVENOUS | Status: AC
  Administered 2023-04-20: 2 g via INTRAVENOUS
  Filled 2023-04-20: qty 50

## 2023-04-20 NOTE — Discharge Summary (Signed)
Physician Discharge Summary   Patient: [name] [name] MRN: [id number] DOB: Jan 30, 1946  Admit date:     04/19/2023  Discharge date: 04/20/2023  Discharge Physician: Marguerita Merles, DO   PCP: Kaleen Mask, MD   Recommendations at discharge:  {Tip this will not be part of the note when signed- Example include specific recommendations for outpatient follow-up, pending tests to follow-up on. (Optional):26781}  ***  Discharge Diagnoses: Principal Problem:   Brain mass Active Problems:   Diabetes mellitus type 2, noninsulin dependent (HCC)   Hyperlipidemia   Essential hypertension, benign   CAD, ARTERY BYPASS GRAFT   Cancer of descending colon s/p robotic left hemicolectomy 09/19/2018   Dysphagia   Acute metabolic encephalopathy  Resolved Problems:   * No resolved hospital problems. *  Hospital Course: No notes on file This is a 77 year old male with past medical history significant for colon cancer in remission, CAD sp CABG, DM type 2, HLD, HTN,  IBS, dysphagia, and GERD. Patient was sent to the ER from assisted living center for worsening confusion and weakness.  Patient is denies nausea, vomiting or headache.  He endorses a 20 pound weight loss.  He denies numbness, weakness or tingling.   In the ER MRI brain shows 1. Extra-axial dural-based mass overlying the left greater than right anterior frontal lobes. Contiguous dural thickening and enhancement extending inferiorly and posteriorly along the falx, and along the bilateral frontal lobes. The constellation of findings is favored to reflect a meningioma over dural-based metastatic disease. The mass invades the anterior third of the superior sagittal sinus. Mass effect upon the underlying brain parenchyma as described. Moderate vasogenic edema within the underlying left frontal lobe. 2. Mild generalized parenchymal atrophy.   Neurosurgeon on-call contacted by EDP.  They will see patient in a.m.  Initiate treatment of  Decadron 4 mg every 6.   Assessment and Plan: No notes have been filed under this hospital service. Service: Hospitalist  Brain Mass -Decadron 4 mg IV every 6 -Neurosurgery aware will see patient in a.m. -N.p.o. at midnight -IV fluid hydration -Per radiology read suspect more of a primary meningioma than metastasis    Memory issues -DDx dementia vs  brain mass -Patient's Namenda    Diabetes mellitus -Sliding scale initiated    CAD, ARTERY BYPASS GRAFT -Patient atorvastatin resumed -Metoprolol resumed at half the dosage, patient with some bradycardia -Aspirin held as patient with chronic punctate microhemorrhage in the right hemisphere    Hypothyroidism -Synthroid resumed    Hyperlipidemia -Atorvastatin resumed    BPH -Vesicare resumed    Cancer of descending colon s/p robotic left hemicolectomy 09/19/2018    {Tip this will not be part of the note when signed Body mass index is 32.39 kg/m. , ,  (Optional):26781}  {(NOTE) Pain control PDMP Statment (Optional):26782} Consultants: *** Procedures performed: ***  Disposition: {Plan; Disposition:26390} Diet recommendation:  {Diet_Plan:26776} DISCHARGE MEDICATION: Allergies as of 04/20/2023   No Known Allergies   Med Rec must be completed prior to using this SMARTLINK***       Follow-up Information     Dawley, Troy C, DO Follow up in 1 week(s).   Contact information: [address] [place] [address] [phone]                Discharge Exam: Ceasar Mons Weights   04/19/23 1519 04/19/23 2204  Weight: 86.9 kg 88.3 kg   ***  Condition at discharge: {DC Condition:26389}  The results of significant diagnostics from this  hospitalization (including imaging, microbiology, ancillary and laboratory) are listed below for reference.   Imaging Studies: MR BRAIN W WO CONTRAST  Result Date: 04/19/2023 CLINICAL DATA:  Provided history: Memory impairment. Additional history provided by the  scanning technologist: Confusion, memory loss, history of colon cancer. EXAM: MRI HEAD WITHOUT AND WITH CONTRAST TECHNIQUE: Multiplanar, multiecho pulse sequences of the brain and surrounding structures were obtained without and with intravenous contrast. CONTRAST:  8.5 mL Vueway intravenous contrast. COMPARISON:  No pertinent prior exams available for comparison. FINDINGS: Brain: Mild generalized parenchymal atrophy. Somewhat heterogeneously enhancing extra-axial dural-based mass overlying the left greater than right anterior frontal lobes. The dominant component of the mass to the left of midline measures 4.7 x 3.8 x 5.6 cm. The dominant component of the mass to the right of midline measures 1.7 x 1.0 x 3.5 cm. There is contiguous dural thickening and enhancement extending inferiorly and posteriorly along the falx and along the bilateral frontal lobes. The constellation of findings is favored to reflect a meningioma. The mass invades the anterior third of the superior sagittal sinus. There is prominent local mass effect upon the anterior aspect of the left frontal lobe with partial effacement of the frontal horns of both lateral ventricles. Posterior displacement of the anterior body and genu of the corpus callosum (left greater than right). Rightward bowing of the anterior falx. Moderate vasogenic edema within the anterior left frontal lobe underlying the mass. Punctate chronic microhemorrhage within the right cerebellar hemisphere. There is no acute infarct. No extra-axial fluid collection. No midline shift. Vascular: Maintained flow voids within the proximal large arterial vessels. Skull and upper cervical spine: Abnormal T1 hypointense marrow signal within portions of the frontal calvarium overlying the mass, favored to reflect hyperostosis. Susceptibility artifact arising from ACDF hardware. Incompletely assessed cervical spondylosis. Sinuses/Orbits: No mass or acute finding within the imaged orbits. Trace  mucosal thickening within the left maxillary sinus. Impression #1 called by telephone at the time of interpretation on 04/19/2023 at 3:50 pm to provider [facility] , who verbally acknowledged these results. Per the provider's instruction, the patient was sent from the imaging center to the emergency department for further evaluation. IMPRESSION: 1. Extra-axial dural-based mass overlying the left greater than right anterior frontal lobes. Contiguous dural thickening and enhancement extending inferiorly and posteriorly along the falx, and along the bilateral frontal lobes. The constellation of findings is favored to reflect a meningioma over dural-based metastatic disease. The mass invades the anterior third of the superior sagittal sinus. Mass effect upon the underlying brain parenchyma as described. Moderate vasogenic edema within the underlying left frontal lobe. 2. Mild generalized parenchymal atrophy. Electronically Signed   By: Jackey Loge D.O.   On: 04/19/2023 15:53    Microbiology: No results found for this or any previous visit.  Labs: CBC: Recent Labs  Lab 04/19/23 1528 04/20/23 0032  WBC 7.3 8.4  NEUTROABS 5.2 5.7  HGB 15.1 14.5  HCT 46.6 43.2  MCV 97.9 97.5  PLT 130* 133*   Basic Metabolic Panel: Recent Labs  Lab 04/19/23 1528 04/20/23 0032  NA 140 138  K 4.5 3.8  CL 106 105  CO2 25 23  GLUCOSE 221* 170*  BUN 11 9  CREATININE 1.12 1.00  CALCIUM 9.1 9.3  MG  --  1.5*   Liver Function Tests: Recent Labs  Lab 04/19/23 1528 04/20/23 0032  AST 54* 42*  ALT 59* 50*  ALKPHOS 64 65  BILITOT 0.9 0.9  PROT 6.2* 5.7*  ALBUMIN  3.5 3.3*   CBG: Recent Labs  Lab 04/19/23 2145 04/19/23 2319 04/20/23 0758 04/20/23 1153  GLUCAP 130* 161* 255* 238*    Discharge time spent: {LESS THAN/GREATER THAN:26388} 30 minutes.  Signed: Merlene Laughter, DO [facility] 04/20/2023

## 2023-04-20 NOTE — Inpatient Diabetes Management (Signed)
Inpatient Diabetes Program Recommendations  AACE/ADA: New Consensus Statement on Inpatient Glycemic Control (2015)  Target Ranges:  Prepandial:   less than 140 mg/dL      Peak postprandial:   less than 180 mg/dL (1-2 hours)      Critically ill patients:  140 - 180 mg/dL   Lab Results  Component Value Date   GLUCAP 238 (H) 04/20/2023   HGBA1C 10.2 (H) 02/23/2023    Review of Glycemic Control  Diabetes history: DM2 Outpatient Diabetes medications: metformin 1000 mg BID, glipizide 20 Qd Current orders for Inpatient glycemic control: Levemir 5 at bedtime, Novolog 0-15 TID and 0-5 HS, Decadron 4 mg Q6H  HgbA1C - 10.2% CBGs today: 255, 238 mg/dL  Inpatient Diabetes Program Recommendations:    Consider increasing Levemir to 8 units at bedtime, if appropriate.  Continue to follow.  Thank you. Ailene Ards, RD, LDN, CDCES Inpatient Diabetes Coordinator [phone]

## 2023-04-20 NOTE — Progress Notes (Signed)
Patient transfer to the unit ,alert and oriented,no complain of pain,patient assessment done,patient made comfortable in room,bed in low position and call light in reach,will continue to monitor.

## 2023-04-20 NOTE — Plan of Care (Signed)

## 2023-04-20 NOTE — Evaluation (Signed)
Occupational Therapy Evaluation Patient Details Name: [name] [name] MRN: [id number] DOB: [birth year] Today's Date: 04/20/2023   History of Present Illness 77 year old male with past medical history significant for colon cancer in remission, CAD sp CABG, DM type 2, HLD, HTN,  IBS, dysphagia, and GERD.  Patient was sent to the ER from assisted living center for worsening confusion and weakness.  Left frontal extra-axial lesion with mass effect and edema.  Surgery planned in a couple weeks.   Clinical Impression   Patient admitted for the diagnosis above.  PTA he lives with his spouse, and except for STM deficits, remains very independent with ADL, iADL and mobility.  Currently, patient is at his baseline, needing no assist for ADL completion, in room mobility/toileting, and walking the halls without an AD.  No post acute therapies is needed, and patient will follow up with neurologist for planned surgery to remove tumor.        Recommendations for follow up therapy are one component of a multi-disciplinary discharge planning process, led by the attending physician.  Recommendations may be updated based on patient status, additional functional criteria and insurance authorization.   Assistance Recommended at Discharge PRN  Patient can return home with the following Assist for transportation    Functional Status Assessment  Patient has not had a recent decline in their functional status  Equipment Recommendations  None recommended by OT    Recommendations for Other Services       Precautions / Restrictions Precautions Precautions: None Restrictions Weight Bearing Restrictions: No      Mobility Bed Mobility Overal bed mobility: Independent                  Transfers Overall transfer level: Independent                        Balance Overall balance assessment: No apparent balance deficits (not formally assessed)                                          ADL either performed or assessed with clinical judgement   ADL Overall ADL's : At baseline                                             Vision Patient Visual Report: No change from baseline       Perception     Praxis      Pertinent Vitals/Pain Pain Assessment Pain Assessment: No/denies pain     Hand Dominance Right   Extremity/Trunk Assessment Upper Extremity Assessment Upper Extremity Assessment: Overall WFL for tasks assessed   Lower Extremity Assessment Lower Extremity Assessment: Overall WFL for tasks assessed   Cervical / Trunk Assessment Cervical / Trunk Assessment: Normal   Communication Communication Communication: No difficulties   Cognition Arousal/Alertness: Awake/alert Behavior During Therapy: WFL for tasks assessed/performed Overall Cognitive Status: Within Functional Limits for tasks assessed Area of Impairment: Memory                     Memory: Decreased short-term memory               General Comments   VSS on RA    Exercises     Shoulder Instructions  Home Living Family/patient expects to be discharged to:: Private residence Living Arrangements: Spouse/significant other Available Help at Discharge: Family Type of Home: House Home Access: Stairs to enter Secretary/administrator of Steps: 3 Entrance Stairs-Rails: Left Home Layout: One level     Bathroom Shower/Tub: Producer, television/film/video: Handicapped height Bathroom Accessibility: Yes   Home Equipment: Agricultural consultant (2 wheels);Shower seat - built in;Cane - single point;Grab bars - tub/shower          Prior Functioning/Environment Prior Level of Function : Independent/Modified Independent                        OT Problem List: Decreased activity tolerance      OT Treatment/Interventions:      OT Goals(Current goals can be found in the care plan section) Acute Rehab OT Goals Patient Stated Goal: Hoping to  return home today OT Goal Formulation: With patient Time For Goal Achievement: 04/27/23 Potential to Achieve Goals: Good  OT Frequency:      Co-evaluation              AM-PAC OT "6 Clicks" Daily Activity     Outcome Measure Help from another person eating meals?: None Help from another person taking care of personal grooming?: None Help from another person toileting, which includes using toliet, bedpan, or urinal?: None Help from another person bathing (including washing, rinsing, drying)?: None Help from another person to put on and taking off regular upper body clothing?: None Help from another person to put on and taking off regular lower body clothing?: None 6 Click Score: 24   End of Session Nurse Communication: Mobility status  Activity Tolerance: Patient tolerated treatment well Patient left: in bed;with call bell/phone within reach;with family/visitor present  OT Visit Diagnosis: Unsteadiness on feet (R26.81)                Time: 1610-9604 OT Time Calculation (min): 24 min Charges:  OT General Charges $OT Visit: 1 Visit OT Evaluation $OT Eval Moderate Complexity: 1 Mod OT Treatments $Self Care/Home Management : 8-22 mins  04/20/2023  RP, OTR/L  Acute Rehabilitation Services  Office:  [phone]   Suzanna Obey 04/20/2023, 3:48 PM

## 2023-04-20 NOTE — Consult Note (Signed)
   Providing Compassionate, Quality Care - Together  Neurosurgery Consult  Referring physician: Dr. Marland Mcalpine  Reason for referral: Brain tumor  Chief Complaint: Memory loss, imaging findings  History of Present Illness: This is a pleasant 77 year old male, right-handed, with complaints of progressive generalized fatigue, memory loss over the last many months.  He underwent outpatient elective MRI, was found to have a large left frontal lesion and due to this was sent to the emergency department.  He denies any visual changes, numbness, tingling.  Does complain of bilateral lower extremity weakness and inability to walk as further as he used to.  Denies any bowel or bladder changes.  Does have a history of colon cancer many years ago status post surgical resection without chemo or radiation.  He denies any seizure-like activity.  Denies any significant family history.   Medications: I have reviewed the patient's current medications. Allergies: No Known Allergies  History reviewed. No pertinent family history. Social History:  has no history on file for tobacco use, alcohol use, and drug use.  ROS: All pertinent positives and negatives are listed in HPI above  Physical Exam:  Vital signs in last 24 hours: Temp:  [98 F (36.7 C)-98.3 F (36.8 C)] 98 F (36.7 C) (07/25 1814) Pulse Rate:  [58-128] 65 (07/26 0746) Resp:  [11-18] 14 (07/26 0217) BP: (138-182)/(65-125) 153/88 (07/26 0700) SpO2:  [91 %-98 %] 96 % (07/26 0746) PE: Awake alert Orient x 3 PERRLA Cranial nerves II through XII intact EOMI Moves all extremities equally No drift No cerebellar ataxia Sensory intact to light touch Speech fluent and appropriate  Impression/Assessment:  77 year old male with  Left frontal extra-axial lesion with mass effect and edema  Plan:  -Recommend CT chest with contrast for evaluation of metastatic workup.  He did have a CT abdomen pelvis as well as an MRI abdomen in April which  revealed no acute lesion. -Recommend Decadron 2 mg twice daily. -An extensive discussion with the patient and his wife at bedside, we went over MRI findings and I showed them the images.  I will have him follow-up with me in the office next week, okay to discharge home today.  I counseled them on signs and symptoms to be aware of and when she should return to the emergency department. -I believe likely based off imaging this is a meningioma however there is superior sagittal sinus invasion and some growth along the right side as well.  Thank you for allowing me to participate in this patient's care.  Please do not hesitate to call with questions or concerns.   Monia Pouch, DO Neurosurgeon [facility] Cell: [phone]

## 2023-04-20 NOTE — Care Management CC44 (Signed)
Condition Code 44 Documentation Completed  Patient Details  Name: ROLLA HAGIE MRN: [id number] Date of Birth: 1946-02-21   Condition Code 44 given:  Yes Patient signature on Condition Code 44 notice:  Yes Documentation of 2 MD's agreement:  Yes Code 44 added to claim:  Yes    Darrold Span, RN 04/20/2023, 4:40 PM

## 2023-04-20 NOTE — Progress Notes (Signed)
   Neuropsychology Note [facility] [place] Department of Neurology     [name] [name] was scheduled for an outpatient neuropsychological evaluation with myself this morning. Per medical records, he was admitted to the ED on 04/19/2023 where a subsequent brain MRI revealed a suspected meningioma measuring 4.7 x 3.8 x 5.6 cm impacting the L > R frontal lobes with mass effect. Given his inpatient status, he will be unable to attend his outpatient testing session. He will be rescheduled in the future depending on how this mass will be treated and at the recommendation of his medical team.

## 2023-04-20 NOTE — Care Management Obs Status (Signed)
MEDICARE OBSERVATION STATUS NOTIFICATION   Patient Details  Name: [name] [name] MRN: [id number] Date of Birth: 04-Jan-1946   Medicare Observation Status Notification Given:  Yes    Darrold Span, RN 04/20/2023, 4:40 PM

## 2023-04-20 NOTE — Progress Notes (Signed)
PT Cancellation Note  Patient Details Name: [name] [name] MRN: [id number] DOB: 12-15-1945   Cancelled Treatment:    Reason Eval/Treat Not Completed: PT screened, no needs identified, will sign off. Per discussion with OT, pt is mobilizing independently in the hallway, no current PT needs at this time. PT signing off.   Arlyss Gandy 04/20/2023, 3:44 PM

## 2023-04-23 ENCOUNTER — Telehealth: Payer: Self-pay | Admitting: Cardiovascular Disease

## 2023-04-23 NOTE — Telephone Encounter (Signed)
Patient's wife is calling stating the patient is going to have to have brain surgery. She is wanting to know if his upcoming test with our office are needed prior to. Patient's wife is aware of surgical clearance being needed and plans to let neurology know at his appt on Wednesday. Please advise.

## 2023-04-24 NOTE — Telephone Encounter (Signed)
Kathleene Hazel, MD  You2 hours ago (12:36 PM)    It sounds like his brain surgery will be urgent given the findings on the scan and his confusion. I think we should cancel the stress test. Thayer Ohm  ______________________________________________________________  I called and spoke w Mrs. Ostrom.  They are seeing the neurosurgeon tomorrow morning.  She will know more after that.  I did not realize the patient is also scheduled for echo Friday morning 04/27/23 as well as nuclear stress test.   I told the patient's wife we would wait until after tomorrows visit before cancelling, just in case anybody is requiring clearance testing prior.  She does recall hearing something about clearance from cardiology but was not sure if they were sending anything.  She will discuss w them tomorrow and is aware we will call her tomorrow afternoon for an update/plan.

## 2023-04-24 NOTE — Telephone Encounter (Signed)
Sonny, Andreotti - 04/23/2023 12:06 PM Kathleene Hazel, MD  Sent: Tue April 24, 2023  4:14 PM  To: Lendon Ka, RN; Loa Socks, LPN         Message  That is ok with me. Thanks both of you. Thayer Ohm

## 2023-04-25 NOTE — Telephone Encounter (Signed)
Jaceyon, Thornberry - 04/23/2023 12:06 PM Kathleene Hazel, MD  Sent: Wed April 25, 2023  2:02 PM  To: Loa Socks, LPN         Message  Thank you.

## 2023-04-25 NOTE — Telephone Encounter (Signed)
Called the pts wife [name] to follow-up on how the pts appt went today with his Neurosurgeon.  Per [name], appt went pretty well and he will require surgery eventually, but not set as urgent at this time.    [name] states the Neurosurgeon wants to do more MRIs of the pts brain and then reassess at that time.  There is no surgery date scheduled at this time, pending further testing.  Wife states that she informed the Neurosurgeon about his upcoming echo and lexiscan on 6/21, and confirmed with her that the pt can hold off on getting this done at this time.  [name] states the Neurosurgeon said both these test can be cancelled and once they concur a final plan and surgery date, if these test are required at that time for clearance, the wife and Neurosurgery will reach out at that time to have these scheduled.   Informed the pts wife that I will send a message to our ECHO/Nuclear Scheduler to cancel both test on 6/21, and that this will be rescheduled at a later date, pending TBD clearance/surgery.   Informed [name] that I will make Dr. Clifton James and Nani Skillern RN aware of this message.  [name] verbalized understanding and agrees with this plan.  [name] was gracious for the follow-up and all the assistance provided.

## 2023-04-25 NOTE — Telephone Encounter (Signed)
RE: cancel ECHO/LEXISCAN ON 6/21 Received: Today Loma Sender, LPN I will cancel and we will take the orders out of active, when the time comes to schedule we will reinstate the order. Thank you

## 2023-04-26 ENCOUNTER — Other Ambulatory Visit: Payer: Self-pay | Admitting: Neurological Surgery

## 2023-04-27 ENCOUNTER — Ambulatory Visit (HOSPITAL_COMMUNITY): Payer: Medicare Other

## 2023-04-30 ENCOUNTER — Encounter: Payer: Medicare Other | Admitting: Psychology

## 2023-04-30 ENCOUNTER — Other Ambulatory Visit: Payer: Medicare Other

## 2023-05-01 ENCOUNTER — Other Ambulatory Visit: Payer: Self-pay | Admitting: Neurological Surgery

## 2023-05-01 NOTE — Pre-Procedure Instructions (Addendum)
Surgical Instructions    Your procedure is scheduled on Tuesday, July 2nd.  Report to [facility] Main Entrance "A" at 09:40 A.M., then check in with the Admitting office.  Call this number if you have problems the morning of surgery:  [phone]  If you have any questions prior to your surgery date call [phone]: Open Monday-Friday 8am-4pm If you experience any cold or flu symptoms such as cough, fever, chills, shortness of breath, etc. between now and your scheduled surgery, please notify us at the above number.     Remember:  Do not eat or drink after midnight the night before your surgery      Take these medicines the morning of surgery with A SIP OF WATER  dexamethasone (DECADRON)  levothyroxine (SYNTHROID)  memantine (NAMENDA)  pantoprazole (PROTONIX)  solifenacin (VESICARE)    If needed: acetaminophen (TYLENOL)  nitroGLYCERIN (NITROSTAT)  ondansetron (ZOFRAN)     Follow your surgeon's instructions on when to stop Aspirin.  If no instructions were given by your surgeon then you will need to call the office to get those instructions.     As of today, STOP taking any Aleve, Naproxen, Ibuprofen, Motrin, Advil, Goody's, BC's, all herbal medications, fish oil, and all vitamins.  WHAT DO I DO ABOUT MY DIABETES MEDICATION?   Do not take glipiZIDE (GLUCOTROL XL) or metFORMIN (GLUCOPHAGE) the morning of surgery.    HOW TO MANAGE YOUR DIABETES BEFORE AND AFTER SURGERY  Why is it important to control my blood sugar before and after surgery? Improving blood sugar levels before and after surgery helps healing and can limit problems. A way of improving blood sugar control is eating a healthy diet by:  Eating less sugar and carbohydrates  Increasing activity/exercise  Talking with your doctor about reaching your blood sugar goals High blood sugars (greater than 180 mg/dL) can raise your risk of infections and slow your recovery, so you will need to focus on controlling  your diabetes during the weeks before surgery. Make sure that the doctor who takes care of your diabetes knows about your planned surgery including the date and location.  How do I manage my blood sugar before surgery? Check your blood sugar at least 4 times a day, starting 2 days before surgery, to make sure that the level is not too high or low.  Check your blood sugar the morning of your surgery when you wake up and every 2 hours until you get to the Short Stay unit.  If your blood sugar is less than 70 mg/dL, you will need to treat for low blood sugar: Do not take insulin. Treat a low blood sugar (less than 70 mg/dL) with  cup of clear juice (cranberry or apple), 4 glucose tablets, OR glucose gel. Recheck blood sugar in 15 minutes after treatment (to make sure it is greater than 70 mg/dL). If your blood sugar is not greater than 70 mg/dL on recheck, call [phone] for further instructions. Report your blood sugar to the short stay nurse when you get to Short Stay.  If you are admitted to the hospital after surgery: Your blood sugar will be checked by the staff and you will probably be given insulin after surgery (instead of oral diabetes medicines) to make sure you have good blood sugar levels. The goal for blood sugar control after surgery is 80-180 mg/dL.                     Do NOT Smoke (Tobacco/Vaping)  for 24 hours prior to your procedure.  If you use a CPAP at night, you may bring your mask/headgear for your overnight stay.   Contacts, glasses, piercing's, hearing aid's, dentures or partials may not be worn into surgery, please bring cases for these belongings.    For patients admitted to the hospital, discharge time will be determined by your treatment team.   Patients discharged the day of surgery will not be allowed to drive home, and someone needs to stay with them for 24 hours.  SURGICAL WAITING ROOM VISITATION Patients having surgery or a procedure may have no more  than 2 support people in the waiting area - these visitors may rotate.   Children under the age of 53 must have an adult with them who is not the patient. If the patient needs to stay at the hospital during part of their recovery, the visitor guidelines for inpatient rooms apply. Pre-op nurse will coordinate an appropriate time for 1 support person to accompany patient in pre-op.  This support person may not rotate.   Please refer to the [facility] website for the visitor guidelines for Inpatients (after your surgery is over and you are in a regular room).    Special instructions:   [place]- Preparing For Surgery  Before surgery, you can play an important role. Because skin is not sterile, your skin needs to be as free of germs as possible. You can reduce the number of germs on your skin by washing with CHG (chlorahexidine gluconate) Soap before surgery.  CHG is an antiseptic cleaner which kills germs and bonds with the skin to continue killing germs even after washing.    Oral Hygiene is also important to reduce your risk of infection.  Remember - BRUSH YOUR TEETH THE MORNING OF SURGERY WITH YOUR REGULAR TOOTHPASTE  Please do not use if you have an allergy to CHG or antibacterial soaps. If your skin becomes reddened/irritated stop using the CHG.  Do not shave (including legs and underarms) for at least 48 hours prior to first CHG shower. It is OK to shave your face.  Please follow these instructions carefully.   Shower the NIGHT BEFORE SURGERY and the MORNING OF SURGERY  If you chose to wash your hair, wash your hair first as usual with your normal shampoo.  After you shampoo, rinse your hair and body thoroughly to remove the shampoo.  Use CHG Soap as you would any other liquid soap. You can apply CHG directly to the skin and wash gently with a scrungie or a clean washcloth.   Apply the CHG Soap to your body ONLY FROM THE NECK DOWN.  Do not use on open wounds or open sores. Avoid  contact with your eyes, ears, mouth and genitals (private parts). Wash Face and genitals (private parts)  with your normal soap.   Wash thoroughly, paying special attention to the area where your surgery will be performed.  Thoroughly rinse your body with warm water from the neck down.  DO NOT shower/wash with your normal soap after using and rinsing off the CHG Soap.  Pat yourself dry with a CLEAN TOWEL.  Wear CLEAN PAJAMAS to bed the night before surgery  Place CLEAN SHEETS on your bed the night before your surgery  DO NOT SLEEP WITH PETS.   Day of Surgery: Take a shower with CHG soap. Do not wear jewelry or makeup Do not wear lotions, powders, perfumes/colognes, or deodorant. Do not shave 48 hours prior to surgery.  Men may shave face and neck. Do not bring valuables to the hospital.  [facility] is not responsible for any belongings or valuables. Do not wear nail polish, gel polish, artificial nails, or any other type of covering on natural nails (fingers and toes) If you have artificial nails or gel coating that need to be removed by a nail salon, please have this removed prior to surgery. Artificial nails or gel coating may interfere with anesthesia's ability to adequately monitor your vital signs. Wear Clean/Comfortable clothing the morning of surgery Remember to brush your teeth WITH YOUR REGULAR TOOTHPASTE.   Please read over the following fact sheets that you were given.    If you received a COVID test during your pre-op visit  it is requested that you wear a mask when out in public, stay away from anyone that may not be feeling well and notify your surgeon if you develop symptoms. If you have been in contact with anyone that has tested positive in the last 10 days please notify you surgeon.

## 2023-05-02 ENCOUNTER — Encounter (HOSPITAL_COMMUNITY)
Admission: RE | Admit: 2023-05-02 | Discharge: 2023-05-02 | Disposition: A | Discharge status: Home / Self Care | Payer: Medicare Other | Source: Ambulatory Visit | Attending: Neurological Surgery | Admitting: Neurological Surgery

## 2023-05-02 ENCOUNTER — Encounter (HOSPITAL_COMMUNITY): Payer: Self-pay

## 2023-05-02 ENCOUNTER — Other Ambulatory Visit: Payer: Self-pay

## 2023-05-02 VITALS — BP 142/62 | HR 52 | Temp 97.8°F | Resp 18 | Ht 65.0 in | Wt 187.9 lb

## 2023-05-02 DIAGNOSIS — E039 Hypothyroidism, unspecified: Secondary | ICD-10-CM | POA: Diagnosis not present

## 2023-05-02 DIAGNOSIS — D320 Benign neoplasm of cerebral meninges: Secondary | ICD-10-CM | POA: Diagnosis not present

## 2023-05-02 DIAGNOSIS — Z85038 Personal history of other malignant neoplasm of large intestine: Secondary | ICD-10-CM | POA: Diagnosis not present

## 2023-05-02 DIAGNOSIS — K219 Gastro-esophageal reflux disease without esophagitis: Secondary | ICD-10-CM | POA: Diagnosis not present

## 2023-05-02 DIAGNOSIS — I10 Essential (primary) hypertension: Secondary | ICD-10-CM | POA: Diagnosis not present

## 2023-05-02 DIAGNOSIS — Z951 Presence of aortocoronary bypass graft: Secondary | ICD-10-CM | POA: Insufficient documentation

## 2023-05-02 DIAGNOSIS — Z01812 Encounter for preprocedural laboratory examination: Secondary | ICD-10-CM | POA: Diagnosis present

## 2023-05-02 DIAGNOSIS — Z7984 Long term (current) use of oral hypoglycemic drugs: Secondary | ICD-10-CM | POA: Insufficient documentation

## 2023-05-02 DIAGNOSIS — E785 Hyperlipidemia, unspecified: Secondary | ICD-10-CM | POA: Diagnosis not present

## 2023-05-02 DIAGNOSIS — Z01818 Encounter for other preprocedural examination: Secondary | ICD-10-CM

## 2023-05-02 DIAGNOSIS — I2510 Atherosclerotic heart disease of native coronary artery without angina pectoris: Secondary | ICD-10-CM | POA: Diagnosis not present

## 2023-05-02 DIAGNOSIS — E119 Type 2 diabetes mellitus without complications: Secondary | ICD-10-CM | POA: Diagnosis not present

## 2023-05-02 HISTORY — DX: Cardiac murmur, unspecified: R01.1

## 2023-05-02 LAB — CBC
HCT: 47.3 % (ref 39.0–52.0)
Hemoglobin: 15.6 g/dL (ref 13.0–17.0)
MCH: 32.8 pg (ref 26.0–34.0)
MCHC: 33 g/dL (ref 30.0–36.0)
MCV: 99.4 fL (ref 80.0–100.0)
Platelets: 138 10*3/uL — ABNORMAL LOW (ref 150–400)
RBC: 4.76 MIL/uL (ref 4.22–5.81)
RDW: 12.7 % (ref 11.5–15.5)
WBC: 18.3 10*3/uL — ABNORMAL HIGH (ref 4.0–10.5)
nRBC: 0 % (ref 0.0–0.2)

## 2023-05-02 LAB — HEMOGLOBIN A1C
Hgb A1c MFr Bld: 10.2 % — ABNORMAL HIGH (ref 4.8–5.6)
Mean Plasma Glucose: 246 mg/dL

## 2023-05-02 LAB — TYPE AND SCREEN
ABO/RH(D): A POS
Antibody Screen: NEGATIVE

## 2023-05-02 LAB — BASIC METABOLIC PANEL
Anion gap: 13 (ref 5–15)
BUN: 25 mg/dL — ABNORMAL HIGH (ref 8–23)
CO2: 18 mmol/L — ABNORMAL LOW (ref 22–32)
Calcium: 9.1 mg/dL (ref 8.9–10.3)
Chloride: 97 mmol/L — ABNORMAL LOW (ref 98–111)
Creatinine, Ser: 1.15 mg/dL (ref 0.61–1.24)
GFR, Estimated: >60 mL/min (ref >60)
Glucose, Bld: 416 mg/dL — ABNORMAL HIGH (ref 70–99)
Potassium: 4.5 mmol/L (ref 3.5–5.1)
Sodium: 128 mmol/L — ABNORMAL LOW (ref 135–145)

## 2023-05-02 LAB — GLUCOSE, CAPILLARY: Glucose-Capillary: 483 mg/dL — ABNORMAL HIGH (ref 70–99)

## 2023-05-02 NOTE — Progress Notes (Signed)
PCP - Dr. Windle Guard Cardiologist - Dr. Verne Carrow  PPM/ICD - denies   Chest x-ray - 02/22/23 EKG - 02/22/23 Stress Test - 06/27/18 ECHO - 11/17/13 Cardiac Cath - 03/21/11  OSA- denies   Fasting Blood Sugar - 200-300 Checks Blood Sugar once a day  Last dose of GLP1 agonist-  n/a   Blood Thinner Instructions: n/a Aspirin Instructions: f/u with surgeon for instructions  ERAS Protcol - no, NPO   COVID TEST- n/a   Anesthesia review: yes, CBG 483 at PAT. Revonda Standard saw pt in PAT and discussed prior orders for Echo and Stress test  Patient denies shortness of breath, fever, cough and chest pain at PAT appointment   All instructions explained to the patient, with a verbal understanding of the material. Patient agrees to go over the instructions while at home for a better understanding.  The opportunity to ask questions was provided.
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
## 2023-05-03 ENCOUNTER — Other Ambulatory Visit (HOSPITAL_COMMUNITY): Payer: Self-pay | Admitting: Neurological Surgery

## 2023-05-03 DIAGNOSIS — D320 Benign neoplasm of cerebral meninges: Secondary | ICD-10-CM

## 2023-05-06 ENCOUNTER — Ambulatory Visit (HOSPITAL_COMMUNITY)
Admission: RE | Admit: 2023-05-06 | Discharge: 2023-05-06 | Disposition: A | Discharge status: Home / Self Care | Payer: Medicare Other | Source: Ambulatory Visit | Attending: Neurological Surgery | Admitting: Neurological Surgery

## 2023-05-06 ENCOUNTER — Other Ambulatory Visit: Payer: Medicare Other

## 2023-05-06 DIAGNOSIS — D320 Benign neoplasm of cerebral meninges: Secondary | ICD-10-CM | POA: Insufficient documentation

## 2023-05-06 MED ORDER — GADOBUTROL 1 MMOL/ML IV SOLN
8.0000 mL | Freq: Once | INTRAVENOUS | Status: AC | PRN
  Administered 2023-05-06: 8 mL via INTRAVENOUS

## 2023-05-07 ENCOUNTER — Ambulatory Visit: Payer: Medicare Other | Admitting: Physician Assistant

## 2023-05-07 NOTE — Anesthesia Preprocedure Evaluation (Signed)
Anesthesia Evaluation  Patient identified by MRN, date of birth, ID band Patient awake    Reviewed: Allergy & Precautions, H&P , NPO status , Patient's Chart, lab work & pertinent test results, reviewed documented beta blocker date and time   Airway Mallampati: II  TM Distance: >3 FB Neck ROM: Full    Dental no notable dental hx. (+) Edentulous Upper, Edentulous Lower, Dental Advisory Given   Pulmonary former smoker   Pulmonary exam normal breath sounds clear to auscultation       Cardiovascular hypertension, Pt. on medications and Pt. on home beta blockers + CAD, + Past MI and + CABG   Rhythm:Regular Rate:Normal     Neuro/Psych Brain tumor  negative neurological ROS  negative psych ROS   GI/Hepatic Neg liver ROS,GERD  Medicated,,  Endo/Other  diabetes, Insulin Dependent, Oral Hypoglycemic AgentsHypothyroidism    Renal/GU negative Renal ROS  negative genitourinary   Musculoskeletal  (+) Arthritis , Osteoarthritis,    Abdominal   Peds  Hematology negative hematology ROS (+)   Anesthesia Other Findings   Reproductive/Obstetrics negative OB ROS                             Anesthesia Physical Anesthesia Plan  ASA: 3  Anesthesia Plan: General   Post-op Pain Management: Tylenol PO (pre-op)*   Induction: Intravenous  PONV Risk Score and Plan: 3 and Ondansetron, Dexamethasone and Treatment may vary due to age or medical condition  Airway Management Planned: Oral ETT  Additional Equipment: Arterial line  Intra-op Plan:   Post-operative Plan: Extubation in OR  Informed Consent: I have reviewed the patients History and Physical, chart, labs and discussed the procedure including the risks, benefits and alternatives for the proposed anesthesia with the patient or authorized representative who has indicated his/her understanding and acceptance.     Dental advisory given  Plan  Discussed with: CRNA  Anesthesia Plan Comments: (See PAT note written 05/07/2023 by Shonna Chock, PA-C. Saw PCP 05/03/23 for hyperglycemia in setting of steroids for brain mass and underlying poorly controlled DM.   )       Anesthesia Quick Evaluation

## 2023-05-08 ENCOUNTER — Inpatient Hospital Stay (HOSPITAL_COMMUNITY)
Admission: RE | Disposition: A | Discharge status: Home / Self Care | Payer: Self-pay | Source: Home / Self Care | Attending: Neurological Surgery

## 2023-05-08 ENCOUNTER — Encounter (HOSPITAL_COMMUNITY): Payer: Self-pay | Admitting: Neurological Surgery

## 2023-05-08 ENCOUNTER — Inpatient Hospital Stay (HOSPITAL_COMMUNITY): Payer: Medicare Other | Admitting: Registered Nurse

## 2023-05-08 ENCOUNTER — Inpatient Hospital Stay (HOSPITAL_COMMUNITY)
Admission: RE | Admit: 2023-05-08 | Discharge: 2023-05-12 | DRG: 025 | Disposition: A | Discharge status: Home / Self Care | Payer: Medicare Other | Attending: Neurological Surgery | Admitting: Neurological Surgery

## 2023-05-08 ENCOUNTER — Other Ambulatory Visit: Payer: Self-pay

## 2023-05-08 ENCOUNTER — Inpatient Hospital Stay (HOSPITAL_COMMUNITY): Payer: Medicare Other | Admitting: Vascular Surgery

## 2023-05-08 DIAGNOSIS — E785 Hyperlipidemia, unspecified: Secondary | ICD-10-CM | POA: Diagnosis present

## 2023-05-08 DIAGNOSIS — D329 Benign neoplasm of meninges, unspecified: Secondary | ICD-10-CM | POA: Diagnosis present

## 2023-05-08 DIAGNOSIS — Z951 Presence of aortocoronary bypass graft: Secondary | ICD-10-CM

## 2023-05-08 DIAGNOSIS — D496 Neoplasm of unspecified behavior of brain: Principal | ICD-10-CM | POA: Diagnosis present

## 2023-05-08 DIAGNOSIS — I2510 Atherosclerotic heart disease of native coronary artery without angina pectoris: Secondary | ICD-10-CM

## 2023-05-08 DIAGNOSIS — Z79890 Hormone replacement therapy (postmenopausal): Secondary | ICD-10-CM | POA: Diagnosis not present

## 2023-05-08 DIAGNOSIS — Z8249 Family history of ischemic heart disease and other diseases of the circulatory system: Secondary | ICD-10-CM | POA: Diagnosis not present

## 2023-05-08 DIAGNOSIS — Z7982 Long term (current) use of aspirin: Secondary | ICD-10-CM | POA: Diagnosis not present

## 2023-05-08 DIAGNOSIS — Z794 Long term (current) use of insulin: Secondary | ICD-10-CM | POA: Diagnosis not present

## 2023-05-08 DIAGNOSIS — K219 Gastro-esophageal reflux disease without esophagitis: Secondary | ICD-10-CM | POA: Diagnosis present

## 2023-05-08 DIAGNOSIS — Z7984 Long term (current) use of oral hypoglycemic drugs: Secondary | ICD-10-CM

## 2023-05-08 DIAGNOSIS — Z79899 Other long term (current) drug therapy: Secondary | ICD-10-CM

## 2023-05-08 DIAGNOSIS — Z87891 Personal history of nicotine dependence: Secondary | ICD-10-CM

## 2023-05-08 DIAGNOSIS — E039 Hypothyroidism, unspecified: Secondary | ICD-10-CM | POA: Diagnosis present

## 2023-05-08 DIAGNOSIS — E119 Type 2 diabetes mellitus without complications: Secondary | ICD-10-CM | POA: Diagnosis present

## 2023-05-08 DIAGNOSIS — I252 Old myocardial infarction: Secondary | ICD-10-CM | POA: Diagnosis not present

## 2023-05-08 DIAGNOSIS — Z833 Family history of diabetes mellitus: Secondary | ICD-10-CM | POA: Diagnosis not present

## 2023-05-08 DIAGNOSIS — Z981 Arthrodesis status: Secondary | ICD-10-CM | POA: Diagnosis not present

## 2023-05-08 DIAGNOSIS — Z7952 Long term (current) use of systemic steroids: Secondary | ICD-10-CM

## 2023-05-08 DIAGNOSIS — G936 Cerebral edema: Secondary | ICD-10-CM | POA: Diagnosis present

## 2023-05-08 DIAGNOSIS — I10 Essential (primary) hypertension: Secondary | ICD-10-CM | POA: Diagnosis not present

## 2023-05-08 DIAGNOSIS — D320 Benign neoplasm of cerebral meninges: Secondary | ICD-10-CM

## 2023-05-08 DIAGNOSIS — Z85038 Personal history of other malignant neoplasm of large intestine: Secondary | ICD-10-CM | POA: Diagnosis not present

## 2023-05-08 DIAGNOSIS — Z9889 Other specified postprocedural states: Secondary | ICD-10-CM

## 2023-05-08 HISTORY — PX: CRANIOTOMY: SHX93

## 2023-05-08 HISTORY — PX: APPLICATION OF CRANIAL NAVIGATION: SHX6578

## 2023-05-08 LAB — POCT I-STAT, CHEM 8
BUN: 25 mg/dL — ABNORMAL HIGH (ref 8–23)
BUN: 19 mg/dL (ref 8–23)
BUN: 19 mg/dL (ref 8–23)
Calcium, Ion: 1.2 mmol/L (ref 1.15–1.40)
Calcium, Ion: 1.19 mmol/L (ref 1.15–1.40)
Calcium, Ion: 1.16 mmol/L (ref 1.15–1.40)
Chloride: 103 mmol/L (ref 98–111)
Chloride: 106 mmol/L (ref 98–111)
Chloride: 106 mmol/L (ref 98–111)
Creatinine, Ser: 1 mg/dL (ref 0.61–1.24)
Creatinine, Ser: 0.9 mg/dL (ref 0.61–1.24)
Creatinine, Ser: 0.9 mg/dL (ref 0.61–1.24)
Glucose, Bld: 96 mg/dL (ref 70–99)
Glucose, Bld: 119 mg/dL — ABNORMAL HIGH (ref 70–99)
Glucose, Bld: 121 mg/dL — ABNORMAL HIGH (ref 70–99)
HCT: 50 % (ref 39.0–52.0)
HCT: 44 % (ref 39.0–52.0)
HCT: 39 % (ref 39.0–52.0)
Hemoglobin: 17 g/dL (ref 13.0–17.0)
Hemoglobin: 15 g/dL (ref 13.0–17.0)
Hemoglobin: 13.3 g/dL (ref 13.0–17.0)
Potassium: 4.2 mmol/L (ref 3.5–5.1)
Potassium: 4.1 mmol/L (ref 3.5–5.1)
Potassium: 4.3 mmol/L (ref 3.5–5.1)
Sodium: 138 mmol/L (ref 135–145)
Sodium: 139 mmol/L (ref 135–145)
Sodium: 139 mmol/L (ref 135–145)
TCO2: 25 mmol/L (ref 22–32)
TCO2: 24 mmol/L (ref 22–32)
TCO2: 24 mmol/L (ref 22–32)

## 2023-05-08 LAB — GLUCOSE, CAPILLARY
Glucose-Capillary: 104 mg/dL — ABNORMAL HIGH (ref 70–99)
Glucose-Capillary: 88 mg/dL (ref 70–99)
Glucose-Capillary: 96 mg/dL (ref 70–99)
Glucose-Capillary: 147 mg/dL — ABNORMAL HIGH (ref 70–99)

## 2023-05-08 LAB — SURGICAL PCR SCREEN
MRSA, PCR: NEGATIVE
Staphylococcus aureus: POSITIVE — AB

## 2023-05-08 SURGERY — CRANIOTOMY TUMOR EXCISION
Anesthesia: General

## 2023-05-08 MED ORDER — PROMETHAZINE HCL 25 MG PO TABS: 12.5000 mg | ORAL_TABLET | ORAL | Status: DC | PRN

## 2023-05-08 MED ORDER — INSULIN ASPART 100 UNIT/ML IJ SOLN
0.0000 [IU] | Freq: Every day | INTRAMUSCULAR | Status: DC
  Administered 2023-05-11: 3 [IU] via SUBCUTANEOUS

## 2023-05-08 MED ORDER — NITROGLYCERIN 0.4 MG SL SUBL: 0.4000 mg | SUBLINGUAL_TABLET | SUBLINGUAL | Status: DC | PRN

## 2023-05-08 MED ORDER — THROMBIN 5000 UNITS EX SOLR
CUTANEOUS | Status: AC
  Filled 2023-05-08: qty 5000

## 2023-05-08 MED ORDER — BUPIVACAINE-EPINEPHRINE (PF) 0.25% -1:200000 IJ SOLN
INTRAMUSCULAR | Status: AC
  Filled 2023-05-08: qty 30

## 2023-05-08 MED ORDER — LEVOTHYROXINE SODIUM 75 MCG PO TABS
75.0000 ug | ORAL_TABLET | Freq: Every morning | ORAL | Status: DC
  Administered 2023-05-09 – 2023-05-12 (×4): 75 ug via ORAL
  Filled 2023-05-08 (×4): qty 1

## 2023-05-08 MED ORDER — INSULIN GLARGINE-YFGN 100 UNIT/ML ~~LOC~~ SOLN
60.0000 [IU] | Freq: Every day | SUBCUTANEOUS | Status: DC
  Filled 2023-05-08: qty 0.6

## 2023-05-08 MED ORDER — NON FORMULARY: Freq: Every day | Status: DC

## 2023-05-08 MED ORDER — ACETAMINOPHEN 650 MG RE SUPP: 650.0000 mg | RECTAL | Status: DC | PRN

## 2023-05-08 MED ORDER — DOCUSATE SODIUM 100 MG PO CAPS
100.0000 mg | ORAL_CAPSULE | Freq: Two times a day (BID) | ORAL | Status: DC
  Administered 2023-05-08 – 2023-05-12 (×8): 100 mg via ORAL
  Filled 2023-05-08 (×8): qty 1

## 2023-05-08 MED ORDER — PROPOFOL 10 MG/ML IV BOLUS
INTRAVENOUS | Status: DC | PRN
  Administered 2023-05-08: 150 mg via INTRAVENOUS
  Administered 2023-05-08: 50 mg via INTRAVENOUS
  Administered 2023-05-08: 100 ug/kg/min via INTRAVENOUS
  Administered 2023-05-08: 40 mg via INTRAVENOUS

## 2023-05-08 MED ORDER — 0.9 % SODIUM CHLORIDE (POUR BTL) OPTIME
TOPICAL | Status: DC | PRN
  Administered 2023-05-08: 3000 mL

## 2023-05-08 MED ORDER — LIDOCAINE-EPINEPHRINE 1 %-1:100000 IJ SOLN
INTRAMUSCULAR | Status: AC
  Filled 2023-05-08: qty 1

## 2023-05-08 MED ORDER — THROMBIN 20000 UNITS EX SOLR
CUTANEOUS | Status: AC
  Filled 2023-05-08: qty 20000

## 2023-05-08 MED ORDER — GLIPIZIDE ER 10 MG PO TB24
20.0000 mg | ORAL_TABLET | Freq: Every day | ORAL | Status: DC
  Administered 2023-05-08 – 2023-05-09 (×2): 20 mg via ORAL
  Filled 2023-05-08 (×2): qty 2

## 2023-05-08 MED ORDER — MEMANTINE HCL 10 MG PO TABS
5.0000 mg | ORAL_TABLET | Freq: Two times a day (BID) | ORAL | Status: DC
  Administered 2023-05-08 – 2023-05-12 (×8): 5 mg via ORAL
  Filled 2023-05-08 (×9): qty 1

## 2023-05-08 MED ORDER — SODIUM CHLORIDE 0.9 % IV SOLN: INTRAVENOUS | Status: DC

## 2023-05-08 MED ORDER — PHENYLEPHRINE 80 MCG/ML (10ML) SYRINGE FOR IV PUSH (FOR BLOOD PRESSURE SUPPORT)
PREFILLED_SYRINGE | INTRAVENOUS | Status: DC | PRN
  Administered 2023-05-08 (×3): 80 ug via INTRAVENOUS

## 2023-05-08 MED ORDER — INSULIN ASPART 100 UNIT/ML IJ SOLN
0.0000 [IU] | Freq: Three times a day (TID) | INTRAMUSCULAR | Status: DC
  Administered 2023-05-09: 5 [IU] via SUBCUTANEOUS
  Administered 2023-05-09: 2 [IU] via SUBCUTANEOUS
  Administered 2023-05-09: 3 [IU] via SUBCUTANEOUS
  Administered 2023-05-10: 5 [IU] via SUBCUTANEOUS
  Administered 2023-05-10 (×2): 3 [IU] via SUBCUTANEOUS
  Administered 2023-05-11: 8 [IU] via SUBCUTANEOUS
  Administered 2023-05-11: 5 [IU] via SUBCUTANEOUS
  Administered 2023-05-11 – 2023-05-12 (×2): 8 [IU] via SUBCUTANEOUS

## 2023-05-08 MED ORDER — BACITRACIN ZINC 500 UNIT/GM EX OINT
TOPICAL_OINTMENT | CUTANEOUS | Status: AC
  Filled 2023-05-08: qty 28.35

## 2023-05-08 MED ORDER — PROPOFOL 10 MG/ML IV BOLUS
INTRAVENOUS | Status: AC
  Filled 2023-05-08: qty 20

## 2023-05-08 MED ORDER — DEXAMETHASONE SODIUM PHOSPHATE 10 MG/ML IJ SOLN
INTRAMUSCULAR | Status: DC | PRN
  Administered 2023-05-08: 5 mg via INTRAVENOUS

## 2023-05-08 MED ORDER — ROCURONIUM BROMIDE 10 MG/ML (PF) SYRINGE
PREFILLED_SYRINGE | INTRAVENOUS | Status: AC
  Filled 2023-05-08: qty 10

## 2023-05-08 MED ORDER — EPHEDRINE SULFATE-NACL 50-0.9 MG/10ML-% IV SOSY
PREFILLED_SYRINGE | INTRAVENOUS | Status: DC | PRN
  Administered 2023-05-08 (×4): 5 mg via INTRAVENOUS

## 2023-05-08 MED ORDER — MICROFIBRILLAR COLL HEMOSTAT EX PADS
MEDICATED_PAD | CUTANEOUS | Status: DC | PRN
  Administered 2023-05-08: 1 via TOPICAL

## 2023-05-08 MED ORDER — BACITRACIN ZINC 500 UNIT/GM EX OINT
TOPICAL_OINTMENT | CUTANEOUS | Status: DC | PRN
  Administered 2023-05-08: 1 via TOPICAL

## 2023-05-08 MED ORDER — HYDROMORPHONE HCL 1 MG/ML IJ SOLN
0.5000 mg | INTRAMUSCULAR | Status: DC | PRN
  Administered 2023-05-08: 1 mg via INTRAVENOUS
  Filled 2023-05-08 (×3): qty 1

## 2023-05-08 MED ORDER — HYDRALAZINE HCL 20 MG/ML IJ SOLN
INTRAMUSCULAR | Status: DC | PRN
  Administered 2023-05-08: 10 mg via INTRAVENOUS

## 2023-05-08 MED ORDER — LABETALOL HCL 5 MG/ML IV SOLN
10.0000 mg | INTRAVENOUS | Status: DC | PRN
  Administered 2023-05-08: 10 mg via INTRAVENOUS
  Administered 2023-05-09: 20 mg via INTRAVENOUS
  Filled 2023-05-08 (×2): qty 4

## 2023-05-08 MED ORDER — ONDANSETRON HCL 4 MG/2ML IJ SOLN
INTRAMUSCULAR | Status: DC | PRN
  Administered 2023-05-08: 4 mg via INTRAVENOUS

## 2023-05-08 MED ORDER — METOPROLOL TARTRATE 25 MG PO TABS
25.0000 mg | ORAL_TABLET | Freq: Every day | ORAL | Status: DC
  Administered 2023-05-11 – 2023-05-12 (×2): 25 mg via ORAL
  Filled 2023-05-08 (×2): qty 1

## 2023-05-08 MED ORDER — DEXAMETHASONE SODIUM PHOSPHATE 4 MG/ML IJ SOLN
4.0000 mg | Freq: Four times a day (QID) | INTRAMUSCULAR | Status: AC
  Administered 2023-05-08 – 2023-05-09 (×4): 4 mg via INTRAVENOUS
  Filled 2023-05-08 (×4): qty 1

## 2023-05-08 MED ORDER — ORAL CARE MOUTH RINSE: 15.0000 mL | Freq: Once | OROMUCOSAL | Status: AC

## 2023-05-08 MED ORDER — ONDANSETRON HCL 4 MG PO TABS: 4.0000 mg | ORAL_TABLET | ORAL | Status: DC | PRN

## 2023-05-08 MED ORDER — CHLORHEXIDINE GLUCONATE CLOTH 2 % EX PADS
6.0000 | MEDICATED_PAD | Freq: Every morning | CUTANEOUS | Status: DC
  Administered 2023-05-09 – 2023-05-12 (×4): 6 via TOPICAL

## 2023-05-08 MED ORDER — LIDOCAINE-EPINEPHRINE 1 %-1:100000 IJ SOLN
INTRAMUSCULAR | Status: DC | PRN
  Administered 2023-05-08: 13.5 mL

## 2023-05-08 MED ORDER — ROCURONIUM BROMIDE 10 MG/ML (PF) SYRINGE
PREFILLED_SYRINGE | INTRAVENOUS | Status: DC | PRN
  Administered 2023-05-08: 40 mg via INTRAVENOUS
  Administered 2023-05-08 (×4): 20 mg via INTRAVENOUS
  Administered 2023-05-08: 60 mg via INTRAVENOUS
  Administered 2023-05-08: 20 mg via INTRAVENOUS

## 2023-05-08 MED ORDER — ONDANSETRON HCL 4 MG/2ML IJ SOLN
INTRAMUSCULAR | Status: AC
  Filled 2023-05-08: qty 2

## 2023-05-08 MED ORDER — ACETAMINOPHEN 500 MG PO TABS: 1000.0000 mg | ORAL_TABLET | Freq: Once | ORAL | Status: AC

## 2023-05-08 MED ORDER — SODIUM CHLORIDE 0.9 % IV SOLN
0.1500 ug/kg/min | INTRAVENOUS | Status: DC
  Filled 2023-05-08: qty 2000

## 2023-05-08 MED ORDER — THROMBIN 5000 UNITS EX SOLR
OROMUCOSAL | Status: DC | PRN
  Administered 2023-05-08 (×2): 5 mL via TOPICAL

## 2023-05-08 MED ORDER — PHENYLEPHRINE HCL-NACL 20-0.9 MG/250ML-% IV SOLN
INTRAVENOUS | Status: DC | PRN
  Administered 2023-05-08: 20 ug/min via INTRAVENOUS

## 2023-05-08 MED ORDER — CHLORHEXIDINE GLUCONATE CLOTH 2 % EX PADS
6.0000 | MEDICATED_PAD | Freq: Once | CUTANEOUS | Status: AC
  Administered 2023-05-08: 6 via TOPICAL

## 2023-05-08 MED ORDER — CEFAZOLIN SODIUM-DEXTROSE 2-4 GM/100ML-% IV SOLN
2.0000 g | Freq: Three times a day (TID) | INTRAVENOUS | Status: AC
  Administered 2023-05-08 – 2023-05-09 (×2): 2 g via INTRAVENOUS
  Filled 2023-05-08 (×2): qty 100

## 2023-05-08 MED ORDER — FENTANYL CITRATE (PF) 250 MCG/5ML IJ SOLN
INTRAMUSCULAR | Status: AC
  Filled 2023-05-08: qty 5

## 2023-05-08 MED ORDER — SUGAMMADEX SODIUM 200 MG/2ML IV SOLN
INTRAVENOUS | Status: DC | PRN
  Administered 2023-05-08: 200 mg via INTRAVENOUS

## 2023-05-08 MED ORDER — CEFAZOLIN SODIUM 1 G IJ SOLR
INTRAMUSCULAR | Status: AC
  Filled 2023-05-08: qty 20

## 2023-05-08 MED ORDER — HYDROCODONE-ACETAMINOPHEN 5-325 MG PO TABS
1.0000 | ORAL_TABLET | ORAL | Status: DC | PRN
  Administered 2023-05-08 – 2023-05-11 (×9): 1 via ORAL
  Filled 2023-05-08 (×10): qty 1

## 2023-05-08 MED ORDER — ACETAMINOPHEN 325 MG PO TABS
650.0000 mg | ORAL_TABLET | ORAL | Status: DC | PRN
  Filled 2023-05-08: qty 2

## 2023-05-08 MED ORDER — LIDOCAINE 2% (20 MG/ML) 5 ML SYRINGE
INTRAMUSCULAR | Status: AC
  Filled 2023-05-08: qty 5

## 2023-05-08 MED ORDER — CEFAZOLIN SODIUM-DEXTROSE 2-4 GM/100ML-% IV SOLN
2.0000 g | INTRAVENOUS | Status: AC
  Administered 2023-05-08 (×2): 2 g via INTRAVENOUS
  Filled 2023-05-08: qty 100

## 2023-05-08 MED ORDER — FENTANYL CITRATE (PF) 250 MCG/5ML IJ SOLN
INTRAMUSCULAR | Status: DC | PRN
  Administered 2023-05-08: 50 ug via INTRAVENOUS
  Administered 2023-05-08: 100 ug via INTRAVENOUS
  Administered 2023-05-08 (×2): 50 ug via INTRAVENOUS

## 2023-05-08 MED ORDER — ONDANSETRON HCL 4 MG/2ML IJ SOLN: 4.0000 mg | INTRAMUSCULAR | Status: DC | PRN

## 2023-05-08 MED ORDER — SODIUM CHLORIDE 0.9 % IV SOLN
0.0100 ug/kg/min | INTRAVENOUS | Status: DC
  Administered 2023-05-08: .3 ug/kg/min via INTRAVENOUS
  Filled 2023-05-08 (×2): qty 2000

## 2023-05-08 MED ORDER — LIDOCAINE 2% (20 MG/ML) 5 ML SYRINGE
INTRAMUSCULAR | Status: DC | PRN
  Administered 2023-05-08: 60 mg via INTRAVENOUS

## 2023-05-08 MED ORDER — METFORMIN HCL 500 MG PO TABS
1000.0000 mg | ORAL_TABLET | Freq: Two times a day (BID) | ORAL | Status: DC
  Administered 2023-05-09 – 2023-05-12 (×7): 1000 mg via ORAL
  Filled 2023-05-08 (×8): qty 2

## 2023-05-08 MED ORDER — CHLORHEXIDINE GLUCONATE CLOTH 2 % EX PADS: 6.0000 | MEDICATED_PAD | Freq: Once | CUTANEOUS | Status: DC

## 2023-05-08 MED ORDER — POLYETHYLENE GLYCOL 3350 17 G PO PACK: 17.0000 g | PACK | Freq: Every day | ORAL | Status: DC | PRN

## 2023-05-08 MED ORDER — THROMBIN 20000 UNITS EX KIT
PACK | CUTANEOUS | Status: DC | PRN
  Administered 2023-05-08 (×2): 20 mL via TOPICAL

## 2023-05-08 MED ORDER — ATORVASTATIN CALCIUM 10 MG PO TABS
10.0000 mg | ORAL_TABLET | Freq: Every day | ORAL | Status: DC
  Administered 2023-05-08 – 2023-05-11 (×4): 10 mg via ORAL
  Filled 2023-05-08 (×4): qty 1

## 2023-05-08 MED ORDER — METOPROLOL TARTRATE 5 MG/5ML IV SOLN
INTRAVENOUS | Status: DC | PRN
  Administered 2023-05-08 (×2): 2.5 mg via INTRAVENOUS

## 2023-05-08 MED ORDER — MUPIROCIN 2 % EX OINT
1.0000 | TOPICAL_OINTMENT | Freq: Two times a day (BID) | CUTANEOUS | Status: DC
  Administered 2023-05-08 – 2023-05-12 (×8): 1 via NASAL
  Filled 2023-05-08 (×3): qty 22

## 2023-05-08 MED ORDER — ALBUMIN HUMAN 5 % IV SOLN: INTRAVENOUS | Status: DC | PRN

## 2023-05-08 MED ORDER — LEVETIRACETAM IN NACL 500 MG/100ML IV SOLN
500.0000 mg | Freq: Two times a day (BID) | INTRAVENOUS | Status: DC
  Administered 2023-05-08 – 2023-05-11 (×6): 500 mg via INTRAVENOUS
  Filled 2023-05-08 (×6): qty 100

## 2023-05-08 MED ORDER — ACETAMINOPHEN 500 MG PO TABS
ORAL_TABLET | ORAL | Status: AC
  Administered 2023-05-08: 1000 mg via ORAL
  Filled 2023-05-08: qty 2

## 2023-05-08 MED ORDER — FLEET ENEMA 7-19 GM/118ML RE ENEM: 1.0000 | ENEMA | Freq: Once | RECTAL | Status: DC | PRN

## 2023-05-08 MED ORDER — FESOTERODINE FUMARATE ER 4 MG PO TB24
4.0000 mg | ORAL_TABLET | Freq: Every day | ORAL | Status: DC
  Administered 2023-05-08 – 2023-05-12 (×5): 4 mg via ORAL
  Filled 2023-05-08 (×5): qty 1

## 2023-05-08 MED ORDER — LACTATED RINGERS IV SOLN: INTRAVENOUS | Status: DC

## 2023-05-08 MED ORDER — CHLORHEXIDINE GLUCONATE 0.12 % MT SOLN
15.0000 mL | Freq: Once | OROMUCOSAL | Status: AC
  Administered 2023-05-08: 15 mL via OROMUCOSAL
  Filled 2023-05-08: qty 15

## 2023-05-08 MED ORDER — PANTOPRAZOLE SODIUM 40 MG PO TBEC
40.0000 mg | DELAYED_RELEASE_TABLET | Freq: Every day | ORAL | Status: DC
  Administered 2023-05-09 – 2023-05-12 (×4): 40 mg via ORAL
  Filled 2023-05-08 (×4): qty 1

## 2023-05-08 MED ORDER — SODIUM CHLORIDE 0.9 % IV SOLN
0.1500 ug/kg/min | INTRAVENOUS | Status: AC
  Filled 2023-05-08: qty 2000

## 2023-05-08 MED ORDER — LEVETIRACETAM IN NACL 1000 MG/100ML IV SOLN
1000.0000 mg | Freq: Once | INTRAVENOUS | Status: AC
  Administered 2023-05-08: 1000 mg via INTRAVENOUS
  Filled 2023-05-08: qty 100

## 2023-05-08 MED ORDER — FENTANYL CITRATE (PF) 100 MCG/2ML IJ SOLN: 25.0000 ug | INTRAMUSCULAR | Status: DC | PRN

## 2023-05-08 MED ORDER — PROPOFOL 1000 MG/100ML IV EMUL
INTRAVENOUS | Status: AC
  Filled 2023-05-08: qty 200

## 2023-05-08 MED ORDER — DEXAMETHASONE SODIUM PHOSPHATE 10 MG/ML IJ SOLN
INTRAMUSCULAR | Status: AC
  Filled 2023-05-08: qty 1

## 2023-05-08 MED ORDER — BUPIVACAINE-EPINEPHRINE 0.25% -1:200000 IJ SOLN
INTRAMUSCULAR | Status: DC | PRN
  Administered 2023-05-08: 13.5 mL

## 2023-05-08 SURGICAL SUPPLY — 86 items
BAG COUNTER SPONGE SURGICOUNT (BAG) ×1 IMPLANT
BAG SPNG CNTER NS LX DISP (BAG) ×1
BLADE CLIPPER SURG (BLADE) ×1 IMPLANT
BLADE SURG 15 STRL LF DISP TIS (BLADE) IMPLANT
BLADE SURG 15 STRL SS (BLADE) ×1
BUR CARBIDE MATCH 3.0 (BURR) ×1 IMPLANT
BUR SPIRAL ROUTER 2.3 (BUR) ×1 IMPLANT
CANISTER SUCT 3000ML PPV (MISCELLANEOUS) ×1 IMPLANT
CLIP RANEY DISP (INSTRUMENTS) IMPLANT
COVER BURR HOLE UNIV 10 (Orthopedic Implant) IMPLANT
COVERAGE SUPPORT O-ARM STEALTH (MISCELLANEOUS) ×1 IMPLANT
DRAIN JACKSON RD 7FR 3/32 (WOUND CARE) IMPLANT
DRAIN JP 10F RND RADIO (DRAIN) IMPLANT
DRAPE MICROSCOPE SLANT 54X150 (MISCELLANEOUS) ×1 IMPLANT
DRAPE NEUROLOGICAL W/INCISE (DRAPES) ×1 IMPLANT
DRAPE SHEET LG 3/4 BI-LAMINATE (DRAPES) ×1 IMPLANT
DRAPE SURG 17X23 STRL (DRAPES) IMPLANT
DRAPE WARM FLUID 44X44 (DRAPES) ×1 IMPLANT
DRESSING AQUACEL AG SP 3.5X10 (GAUZE/BANDAGES/DRESSINGS) IMPLANT
DRSG AQUACEL AG ADV 3.5X14 (GAUZE/BANDAGES/DRESSINGS) IMPLANT
DRSG AQUACEL AG SP 3.5X10 (GAUZE/BANDAGES/DRESSINGS) ×1
DURAPREP 6ML APPLICATOR 50/CS (WOUND CARE) ×1 IMPLANT
ELECT COATED BLADE 2.86 ST (ELECTRODE) ×2 IMPLANT
ELECT REM PT RETURN 9FT ADLT (ELECTROSURGICAL) ×1
ELECTRODE REM PT RTRN 9FT ADLT (ELECTROSURGICAL) ×1 IMPLANT
EVACUATOR SILICONE 100CC (DRAIN) IMPLANT
FEE COVERAGE SUPPORT O-ARM (MISCELLANEOUS) ×1 IMPLANT
FORCEPS BIPO MALIS IRRIG 9X1.5 (NEUROSURGERY SUPPLIES) ×1 IMPLANT
GAUZE 4X4 16PLY ~~LOC~~+RFID DBL (SPONGE) IMPLANT
GAUZE SPONGE 4X4 12PLY STRL (GAUZE/BANDAGES/DRESSINGS) IMPLANT
GLOVE BIO SURGEON STRL SZ7 (GLOVE) ×2 IMPLANT
GLOVE BIOGEL PI IND STRL 7.5 (GLOVE) ×2 IMPLANT
GLOVE BIOGEL PI IND STRL 8 (GLOVE) ×2 IMPLANT
GLOVE ECLIPSE 7.5 STRL STRAW (GLOVE) IMPLANT
GLOVE ECLIPSE 8.0 STRL XLNG CF (GLOVE) ×2 IMPLANT
GLOVE INDICATOR 7.5 STRL GRN (GLOVE) IMPLANT
GOWN STRL REUS W/ TWL LRG LVL3 (GOWN DISPOSABLE) IMPLANT
GOWN STRL REUS W/ TWL XL LVL3 (GOWN DISPOSABLE) ×3 IMPLANT
GOWN STRL REUS W/TWL 2XL LVL3 (GOWN DISPOSABLE) IMPLANT
GOWN STRL REUS W/TWL LRG LVL3 (GOWN DISPOSABLE) ×1
GOWN STRL REUS W/TWL XL LVL3 (GOWN DISPOSABLE) ×8
GRAFT DURAGEN MATRIX 5WX7L (Graft) IMPLANT
HEMOSTAT POWDER KIT SURGIFOAM (HEMOSTASIS) ×1 IMPLANT
HEMOSTAT SNOW SURGICEL 2X4 (HEMOSTASIS) IMPLANT
HEMOSTAT SURGICEL 2X14 (HEMOSTASIS) ×1 IMPLANT
HOOK RETRACTION 12 ELAST STAY (MISCELLANEOUS) IMPLANT
IV NS 1000ML (IV SOLUTION)
IV NS 1000ML BAXH (IV SOLUTION) ×1 IMPLANT
KIT BASIN OR (CUSTOM PROCEDURE TRAY) ×1 IMPLANT
KIT TURNOVER KIT B (KITS) ×1 IMPLANT
MARKER SPHERE PSV REFLC NDI (MISCELLANEOUS) ×3 IMPLANT
NDL HYPO 22X1.5 SAFETY MO (MISCELLANEOUS) ×1 IMPLANT
NEEDLE HYPO 22X1.5 SAFETY MO (MISCELLANEOUS) ×1 IMPLANT
NS IRRIG 1000ML POUR BTL (IV SOLUTION) ×3 IMPLANT
PACK CRANIOTOMY CUSTOM (CUSTOM PROCEDURE TRAY) ×1 IMPLANT
PATTIES SURGICAL .5 X.5 (GAUZE/BANDAGES/DRESSINGS) IMPLANT
PATTIES SURGICAL .5 X1 (DISPOSABLE) IMPLANT
PATTIES SURGICAL .5 X3 (DISPOSABLE) IMPLANT
PATTIES SURGICAL 1X1 (DISPOSABLE) IMPLANT
PERFORATOR LRG 14-11MM (BIT) ×1 IMPLANT
PIN MAYFIELD SKULL DISP (PIN) ×1 IMPLANT
RETRACTOR LONE STAR DISPOSABLE (INSTRUMENTS) ×2 IMPLANT
SCREW UNIII AXS SD 1.5X4 (Screw) IMPLANT
SPONGE NEURO XRAY DETECT 1X3 (DISPOSABLE) IMPLANT
SPONGE SURGIFOAM ABS GEL 100 (HEMOSTASIS) ×1 IMPLANT
STAPLER VISISTAT 35W (STAPLE) ×1 IMPLANT
STOCKINETTE 6 STRL (DRAPES) ×1 IMPLANT
STRIP CLOSURE SKIN 1/2X4 (GAUZE/BANDAGES/DRESSINGS) ×1 IMPLANT
SUT BONE WAX W31G (SUTURE) IMPLANT
SUT ETHILON 3 0 FSL (SUTURE) IMPLANT
SUT ETHILON 3 0 PS 1 (SUTURE) IMPLANT
SUT NURALON 4 0 TR CR/8 (SUTURE) ×3 IMPLANT
SUT SILK 0 (SUTURE) ×1
SUT SILK 0 MO-6 18XCR BRD 8 (SUTURE) IMPLANT
SUT SILK 2 0 PERMA HAND 18 BK (SUTURE) IMPLANT
SUT VIC AB 0 CT1 18XCR BRD8 (SUTURE) ×1 IMPLANT
SUT VIC AB 0 CT1 8-18 (SUTURE) ×1
SUT VIC AB 2-0 CP2 18 (SUTURE) ×1 IMPLANT
SUT VICRYL RAPIDE 4/0 PS 2 (SUTURE) ×1 IMPLANT
TOWEL GREEN STERILE (TOWEL DISPOSABLE) ×1 IMPLANT
TOWEL GREEN STERILE FF (TOWEL DISPOSABLE) ×1 IMPLANT
TRAY FOLEY MTR SLVR 16FR STAT (SET/KITS/TRAYS/PACK) ×1 IMPLANT
TUBE CONNECTING 12X1/4 (SUCTIONS) ×1 IMPLANT
TUBING FEATHERFLOW (TUBING) IMPLANT
UNDERPAD 30X36 HEAVY ABSORB (UNDERPADS AND DIAPERS) ×1 IMPLANT
WATER STERILE IRR 1000ML POUR (IV SOLUTION) ×1 IMPLANT

## 2023-05-08 NOTE — Op Note (Signed)
Providing Compassionate, Quality Care - Together  Date of service: 05/08/2023  PREOP DIAGNOSIS:  Large bifrontal extra-axial tumor with regional mass effect  POSTOP DIAGNOSIS: Same  PROCEDURE: Stereotactic bifrontal craniotomy for resection of extra-axial tumor, likely meningioma Intraoperative use of stereotaxy, Medtronic Stealth Intraoperative use of microscope for microdissection  SURGEON: Dr. Kendell Bane C. Nalina Yeatman, DO  ASSISTANT: Dr. Hoyt Koch, MD; Patrici Ranks, PA  ANESTHESIA: General Endotracheal  EBL: 300 cc  SPECIMENS: Bifrontal tumor  DRAINS: 10 flat JP, subgaleal space  COMPLICATIONS: None  CONDITION: Hemodynamically stable  HISTORY: [name] [name] is a 77 y.o. male presented with confusion and was found to have an large bifrontal extra-axial tumor centered around the anterior falx with local mass effect and edema, as well as superior sagittal sinus invasion.  Given the edema, I recommended surgical resection in the form of a bifrontal craniotomy for resection of tumor.  We discussed all risks, benefits and expected outcomes as well as alternatives to treatment.  Informed consent was obtained and witnessed.  PROCEDURE IN DETAIL: The patient was brought to the operating room. After induction of general anesthesia, the patient was positioned on the operative table in the supine position.  Mayfield head holder was applied, stereotaxy was registered and verified to excellent accuracy with anatomic landmarks.  All pressure points were meticulously padded.  Bicoronal skin incision was then marked out and prepped and draped in the usual sterile fashion. Physician driven timeout was performed.  Local anesthetic was injected into the planned incision.  Using a 15 blade, incision was made sharply down through the galea.  Using sharp dissection, I harvested a large paracranial flap utilizing a 15 blade dissecting anteriorly and posteriorly in the subgaleal space.  Raney clips were  applied.  Intrafascial temporalis dissection was performed using 15 blade and a Penfield 1 on the left in order to reflect the flap anteriorly.  Subperiosteal dissection was performed anteriorly to the base of the frontal bones.  The paracranial flap was protected and a wet Ray-Tec.  Self-retaining retractors were placed.  Using stereotaxy, bifrontal craniotomy was performed in the standard fashion.  The bilateral frontal sinuses were entered.  These were exenterated, and then filled with fibrillar and bone wax.  The superior sagittal sinus was then identified and venous bleeding was covered with Gelfoam.  Durotomy was created using 15 blade along the left lateral border of the tumor.  This was extended with Metzenbaum scissors circumferentially to the edges of the superior sagittal sinus anteriorly and posteriorly.  This was repeated on the right side.  The microscope was then sterilely draped and brought into the field for the remainder the procedure for microdissection.  Using bipolar cautery and microscissors, the tumor capsule was dissected from the surrounding left frontal lobe and parasitized vessels were coagulated and cut sharply down to the depth of the tumor.  And an appropriate border was identified anteriorly and laterally.  Posteriorly there is some significant adherence and some brain invasion along the posterior region of the left frontal lobe.  Right frontal dissection was performed to identify the contralateral region of the tumor, and interhemispheric dissection was performed to isolate the falx and identify the A2 and A3 vessels.  These were identified and gently dissected from the surrounding tumor.  I then proceeded to utilize a 0 silk suture to tie off the posterior region of the superior sagittal sinus where tumor invasion was involved.  Another 0 silk suture was placed anteriorly and the superior sagittal sinus  was cut sharply.  The falx was then followed inferiorly using bipolar forceps  and microscissors and completely detached posteriorly.  The remaining posterior portion of falx that had small involvement of tumor was coagulated.  Dissection was then further carried anteriorly and the A2 and A3 were sharply dissected from the tumor capsule.  Anterior border was then dissected using bipolar forceps and microscissors from the falx which was cut sharply after coagulating with bipolar forceps.  There is a good plane anteriorly.  The posterior plane that was rather difficult along the posterior left frontal region was further dissected and coagulated and cut sharply from surrounding brain parenchyma.  The resection cavity was then explored, the remaining anterior falx and dura was coagulated with bipolar forceps as well as the edges of the durotomy.  The posterior remaining falx was coagulated as well.  The resection cavity was copiously irrigated and hemostasis was achieved with bipolar forceps.  The resection cavity was then lined with Surgicel.  It was noted to be excellently hemostatic.  DuraGen was placed over the resection site.  Epidural hemostasis was achieved with Gelfoam and Surgifoam.  Paracranial flap was then reflected and placed over the bilateral frontal sinuses.  The craniotomy flap was then affixed with the cranial plating system to its original position.  Retractors were taken out of the wound.  Hemostasis achieved with monopolar cautery.  A 10 flat drain was tunneled posteriorly and placed in the subgaleal space.  Wound was then closed, galea with 2-0 Vicryl sutures, skin with staples.  Sterile dressing was applied.  At the end of the case all sponge, needle, and instrument counts were correct. The patient was then transferred to the stretcher, extubated, and taken to the post-anesthesia care unit in stable hemodynamic condition.

## 2023-05-08 NOTE — Transfer of Care (Signed)
Immediate Anesthesia Transfer of Care Note  Patient: [name] [name]  Procedure(s) Performed: BIFRONTAL CRANIOTOMY TUMOR EXCISION APPLICATION OF CRANIAL NAVIGATION  Patient Location: PACU  Anesthesia Type:General  Level of Consciousness: drowsy, patient cooperative, and responds to stimulation  Airway & Oxygen Therapy: Patient Spontanous Breathing  Post-op Assessment: Report given to RN, Post -op Vital signs reviewed and stable, and Patient moving all extremities X 4  Post vital signs: Reviewed and stable  Last Vitals:  Vitals Value Taken Time  BP 103/48 05/08/23 1908  Temp    Pulse 51 05/08/23 1914  Resp 14 05/08/23 1914  SpO2 98 % 05/08/23 1914  Vitals shown include unvalidated device data.  Last Pain:  Vitals:   05/08/23 1012  TempSrc:   PainSc: 0-No pain         Complications: There were no known notable events for this encounter.

## 2023-05-08 NOTE — Anesthesia Procedure Notes (Addendum)
Arterial Line Insertion Start/End7/12/2022 12:00 PM, 05/08/2023 12:05 PM Performed by: Gaynelle Adu, MD, Waynard Edwards, CRNA, CRNA  Patient location: OOR procedure area. Preanesthetic checklist: patient identified, IV checked, site marked, risks and benefits discussed, surgical consent, monitors and equipment checked, pre-op evaluation, timeout performed and anesthesia consent Lidocaine 1% used for infiltration Left, radial was placed Catheter size: 20 G Hand hygiene performed , maximum sterile barriers used  and Seldinger technique used Allen's test indicative of satisfactory collateral circulation Attempts: 2 Procedure performed without using ultrasound guided technique. Following insertion, dressing applied and Biopatch. Post procedure assessment: normal  Patient tolerated the procedure well with no immediate complications.

## 2023-05-08 NOTE — Progress Notes (Signed)
[name] [name], [place] called at # listed. Per PA, "tremors probably from anesthesia, if pt has any neuro decline ley me know". Wife at bedside and updated.

## 2023-05-08 NOTE — Inpatient Diabetes Management (Signed)
Inpatient Diabetes Program Recommendations  AACE/ADA: New Consensus Statement on Inpatient Glycemic Control   Target Ranges:  Prepandial:   less than 140 mg/dL      Peak postprandial:   less than 180 mg/dL (1-2 hours)      Critically ill patients:  140 - 180 mg/dL    Latest Reference Range & Units 05/08/23 09:53  Glucose-Capillary 70 - 99 mg/dL 440 (H)   Review of Glycemic Control  Diabetes history: DM2 Outpatient Diabetes medications: Lantus 60 units daily, Metformin 1000 mg BID, Glipizide XL 20 mg daily; Decadron 2 mg BID Current orders for Inpatient glycemic control: None; in PeriOp  Inpatient Diabetes Program Recommendations:    Insulin: Please consider ordering Semglee 17 units at bedtime (based on 83 kg x 0.2 units), CBGs Q4H, and Novolog 0-15 units Q4H. If CBGs are consistently over 180 mg/dl, may need to use IV insulin to get glucose controlled and help determine insulin needs following surgery.  NOTE: Noted consult for diabetes coordinator for "PCP Dr. Jeannetta Nap started him on Lantus Solostar pen 60 units/day on 05/03/23. For craniotomy 05/08/23." Per chart review, patient was inpatient 04/19/23-04/20/23 and had only been taking Metformin and Glipizide for DM control outpatient. Per home medication list in chart, patient last took Lantus 60 units on 05/07/23 and CBG 104 mg/dl at 3:47 am today. Patient is currently in Perioperative area. Tried to call patient's wife cell phone to inquire about Lantus and CBGs but goes straight to voicemail. Patient is not currently ordered steroids. If steroids are given in OR or ordered following surgery, anticipate glucose will be higher.  Inpatient diabetes team will follow along and make further recommendations if needed as more data is collected.   Thanks, Orlando Penner, RN, MSN, CDCES Diabetes Coordinator Inpatient Diabetes Program [phone] (Team Pager from 8am to 5pm)
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
## 2023-05-08 NOTE — Progress Notes (Signed)
Pt noted to have upper extremity tremors per family. Dr. Esperanza Richters paged and informed. Waiting for return response.

## 2023-05-08 NOTE — Anesthesia Procedure Notes (Signed)
Procedure Name: Intubation Date/Time: 05/08/2023 12:48 PM  Performed by: Carolynne Edouard, RNPre-anesthesia Checklist: Patient identified, Emergency Drugs available, Suction available and Patient being monitored Patient Re-evaluated:Patient Re-evaluated prior to induction Oxygen Delivery Method: Circle system utilized Preoxygenation: Pre-oxygenation with 100% oxygen Induction Type: IV induction Ventilation: Mask ventilation without difficulty Laryngoscope Size: Mac and 4 Grade View: Grade I Tube type: Oral Tube size: 7.5 mm Number of attempts: 1 Airway Equipment and Method: Stylet and Oral airway Placement Confirmation: ETT inserted through vocal cords under direct vision, positive ETCO2 and breath sounds checked- equal and bilateral Secured at: 21 cm Tube secured with: Tape Dental Injury: Teeth and Oropharynx as per pre-operative assessment

## 2023-05-08 NOTE — Anesthesia Postprocedure Evaluation (Signed)
Anesthesia Post Note  Patient: [name] [name]  Procedure(s) Performed: BIFRONTAL CRANIOTOMY TUMOR EXCISION APPLICATION OF CRANIAL NAVIGATION     Patient location during evaluation: PACU Anesthesia Type: General Level of consciousness: patient cooperative Pain management: pain level controlled Vital Signs Assessment: post-procedure vital signs reviewed and stable Respiratory status: spontaneous breathing, nonlabored ventilation, respiratory function stable and patient connected to nasal cannula oxygen Cardiovascular status: blood pressure returned to baseline and stable Postop Assessment: no apparent nausea or vomiting Anesthetic complications: no   There were no known notable events for this encounter.  Last Vitals:  Vitals:   05/08/23 2200 05/08/23 2300  BP: 134/67 (!) 111/58  Pulse: (!) 52 (!) 53  Resp: 17 (!) 9  Temp:    SpO2: 94% 98%    Last Pain:  Vitals:   05/08/23 2200  TempSrc:   PainSc: Asleep                 Jerie Basford

## 2023-05-09 ENCOUNTER — Encounter (HOSPITAL_COMMUNITY): Payer: Self-pay | Admitting: Neurological Surgery

## 2023-05-09 ENCOUNTER — Inpatient Hospital Stay (HOSPITAL_COMMUNITY): Payer: Medicare Other

## 2023-05-09 LAB — CBC
HCT: 37.8 % — ABNORMAL LOW (ref 39.0–52.0)
Hemoglobin: 12.5 g/dL — ABNORMAL LOW (ref 13.0–17.0)
MCH: 32.2 pg (ref 26.0–34.0)
MCHC: 33.1 g/dL (ref 30.0–36.0)
MCV: 97.4 fL (ref 80.0–100.0)
Platelets: 102 10*3/uL — ABNORMAL LOW (ref 150–400)
RBC: 3.88 MIL/uL — ABNORMAL LOW (ref 4.22–5.81)
RDW: 13.7 % (ref 11.5–15.5)
WBC: 16.9 10*3/uL — ABNORMAL HIGH (ref 4.0–10.5)
nRBC: 0 % (ref 0.0–0.2)

## 2023-05-09 LAB — BASIC METABOLIC PANEL
Anion gap: 13 (ref 5–15)
BUN: 17 mg/dL (ref 8–23)
CO2: 21 mmol/L — ABNORMAL LOW (ref 22–32)
Calcium: 8 mg/dL — ABNORMAL LOW (ref 8.9–10.3)
Chloride: 104 mmol/L (ref 98–111)
Creatinine, Ser: 0.98 mg/dL (ref 0.61–1.24)
GFR, Estimated: >60 mL/min (ref >60)
Glucose, Bld: 200 mg/dL — ABNORMAL HIGH (ref 70–99)
Potassium: 4.1 mmol/L (ref 3.5–5.1)
Sodium: 138 mmol/L (ref 135–145)

## 2023-05-09 LAB — GLUCOSE, CAPILLARY
Glucose-Capillary: 218 mg/dL — ABNORMAL HIGH (ref 70–99)
Glucose-Capillary: 189 mg/dL — ABNORMAL HIGH (ref 70–99)
Glucose-Capillary: 145 mg/dL — ABNORMAL HIGH (ref 70–99)
Glucose-Capillary: 200 mg/dL — ABNORMAL HIGH (ref 70–99)

## 2023-05-09 MED ORDER — INSULIN GLARGINE-YFGN 100 UNIT/ML ~~LOC~~ SOLN
15.0000 [IU] | Freq: Every day | SUBCUTANEOUS | Status: DC
  Administered 2023-05-09 – 2023-05-11 (×3): 15 [IU] via SUBCUTANEOUS
  Filled 2023-05-09 (×6): qty 0.15

## 2023-05-09 MED ORDER — DEXAMETHASONE SODIUM PHOSPHATE 4 MG/ML IJ SOLN
4.0000 mg | Freq: Two times a day (BID) | INTRAMUSCULAR | Status: DC
  Administered 2023-05-10 – 2023-05-12 (×5): 4 mg via INTRAVENOUS
  Filled 2023-05-09 (×5): qty 1

## 2023-05-09 MED ORDER — GADOBUTROL 1 MMOL/ML IV SOLN
8.0000 mL | Freq: Once | INTRAVENOUS | Status: AC | PRN
  Administered 2023-05-09: 8 mL via INTRAVENOUS

## 2023-05-09 MED ORDER — CLEVIDIPINE BUTYRATE 0.5 MG/ML IV EMUL: 0.0000 mg/h | INTRAVENOUS | Status: DC

## 2023-05-09 MED ORDER — ORAL CARE MOUTH RINSE: 15.0000 mL | OROMUCOSAL | Status: DC | PRN

## 2023-05-09 MED FILL — Thrombin For Soln 20000 Unit: CUTANEOUS | Qty: 1 | Status: AC

## 2023-05-09 MED FILL — Thrombin For Soln 5000 Unit: CUTANEOUS | Qty: 5000 | Status: AC

## 2023-05-09 NOTE — Progress Notes (Signed)
   Providing Compassionate, Quality Care - Together  NEUROSURGERY PROGRESS NOTE   S: No issues overnight.   O: EXAM:  BP (!) 113/59 (BP Location: Right Arm)   Pulse (!) 57   Temp 97.9 F (36.6 C) (Oral)   Resp 10   Ht 5\' 5"  (1.651 m)   Wt 83 kg   SpO2 94%   BMI 30.45 kg/m   Awake, alert, disoriented  PERRL Speech fluent, appropriate  CNs grossly intact  5/5 BUE/BLE  Dressing c/d/I Jp in place  ASSESSMENT:  77 y.o. male with   Large bifrontal meningioma  Status post crani for resection, 05/08/2023  PLAN: -Postop MRI reviewed.  Expected postoperative changes, no residual enhancement identified. -PT/OT mobilization -DC Foley, A-line -Leave JP -Decadron taper    Thank you for allowing me to participate in this patient's care.  Please do not hesitate to call with questions or concerns.   Monia Pouch, DO Neurosurgeon [facility] Cell: [phone]

## 2023-05-09 NOTE — Inpatient Diabetes Management (Signed)
Inpatient Diabetes Program Recommendations  AACE/ADA: New Consensus Statement on Inpatient Glycemic Control   Target Ranges:  Prepandial:   less than 140 mg/dL      Peak postprandial:   less than 180 mg/dL (1-2 hours)      Critically ill patients:  140 - 180 mg/dL   Review of Glycemic Control  Latest Reference Range & Units 05/08/23 09:53 05/08/23 12:20 05/08/23 19:08 05/08/23 21:16 05/09/23 07:36  Glucose-Capillary 70 - 99 mg/dL 161 (H) 88 96 096 (H) 045 (H)   Diabetes history: DM2 Outpatient Diabetes medications: Lantus 60 units daily, Metformin 1000 mg BID, Glipizide XL 20 mg daily; Decadron 2 mg BID Current orders for Inpatient glycemic control:  Metformin 1000 mg bid Semglee 60 units qhs Novolog 0-15 units tid + hs Glipizide 20 mg Daily  Decadron 4 mg Q12 hours  Inpatient Diabetes Program Recommendations:    Note basal insulin not administered last night, glucose trends at 218 this am on only correction scale and steroid doses. Steroid frequency also decreased today from every 6 hours to every 12 hours.  -   Reduce Semglee 15 units at bedtime (based on 83 kg x 0.2 units) -   Consider discontinuing glipizide due to risk of hypoglycemia  Spoke with Dr. Jake Samples regarding Glucose trends and plan for pt. New orders received. Will watch glucose trends.   Thanks, Christena Deem RN, MSN, BC-ADM Inpatient Diabetes Coordinator Team Pager [phone] (8a-5p)

## 2023-05-10 LAB — GLUCOSE, CAPILLARY
Glucose-Capillary: 163 mg/dL — ABNORMAL HIGH (ref 70–99)
Glucose-Capillary: 153 mg/dL — ABNORMAL HIGH (ref 70–99)
Glucose-Capillary: 209 mg/dL — ABNORMAL HIGH (ref 70–99)
Glucose-Capillary: 184 mg/dL — ABNORMAL HIGH (ref 70–99)

## 2023-05-10 MED ORDER — ENSURE ENLIVE PO LIQD
237.0000 mL | Freq: Two times a day (BID) | ORAL | Status: DC
  Administered 2023-05-11 – 2023-05-12 (×3): 237 mL via ORAL

## 2023-05-10 NOTE — Evaluation (Signed)
Occupational Therapy Evaluation Patient Details Name: [name] [name] MRN: [id number] DOB: 04/03/1946 Today's Date: 05/10/2023   History of Present Illness Patient is 77 y.o. male presenting on 7/2 for planned bifrontal craniotomy, resection of tumor likely consistent with meningioma. PMH includes: CAD, colon cancer, DM, HTN, MI, PVC, ACDF, CABG x 3.   Clinical Impression   Patient admitted for above and presents with problem list below.  Patient lives with his spouse in a 1 level home, spouse reports she assists with meds, IADLs and pt does not drive.  Patient completes ADLs and mobility independently. Today, patient requires min guard for transfers, mobility and standing ADLs for safety and mild instability.  He demonstrates decreased attention, recall, and problem solving.  Based on performance today, recommend continued OT services acutely and after dc at outpatient OT level to optimize independence, safety and return to PLOF.       Recommendations for follow up therapy are one component of a multi-disciplinary discharge planning process, led by the attending physician.  Recommendations may be updated based on patient status, additional functional criteria and insurance authorization.   Assistance Recommended at Discharge Intermittent Supervision/Assistance  Patient can return home with the following Assist for transportation;Direct supervision/assist for medications management;Direct supervision/assist for financial management;Assistance with cooking/housework    Functional Status Assessment  Patient has had a recent decline in their functional status and demonstrates the ability to make significant improvements in function in a reasonable and predictable amount of time.  Equipment Recommendations  None recommended by OT    Recommendations for Other Services       Precautions / Restrictions Precautions Precautions: Fall Precaution Comments: JP drain Restrictions Weight Bearing  Restrictions: No      Mobility Bed Mobility               General bed mobility comments: OOB upon entry    Transfers Overall transfer level: Needs assistance   Transfers: Sit to/from Stand Sit to Stand: Min guard           General transfer comment: for safety, mild unsteadiness      Balance Overall balance assessment: Mild deficits observed, not formally tested                                         ADL either performed or assessed with clinical judgement   ADL Overall ADL's : Needs assistance/impaired     Grooming: Min guard;Wash/dry hands;Standing           Upper Body Dressing : Set up;Sitting   Lower Body Dressing: Min guard;Sit to/from stand   Toilet Transfer: Min guard;Ambulation   Toileting- Clothing Manipulation and Hygiene: Min guard;Sit to/from stand       Functional mobility during ADLs: Min guard;Cueing for safety       Vision Patient Visual Report: No change from baseline Vision Assessment?: No apparent visual deficits     Perception     Praxis      Pertinent Vitals/Pain Pain Assessment Pain Assessment: No/denies pain     Hand Dominance Right   Extremity/Trunk Assessment Upper Extremity Assessment Upper Extremity Assessment: RUE deficits/detail;LUE deficits/detail RUE Deficits / Details: mild decreased coordination with finger to nose testing, otherwise WFL RUE Coordination: decreased gross motor LUE Deficits / Details: mild decreased coordination with finger to nose testing, otherwise WFL LUE Coordination: decreased gross motor   Lower Extremity Assessment Lower Extremity  Assessment: Defer to PT evaluation       Communication Communication Communication: No difficulties   Cognition Arousal/Alertness: Awake/alert Behavior During Therapy: WFL for tasks assessed/performed Overall Cognitive Status: Impaired/Different from baseline Area of Impairment: Memory, Problem solving, Attention, Awareness                    Current Attention Level: Selective Memory: Decreased recall of precautions, Decreased short-term memory     Awareness: Emergent Problem Solving: Slow processing, Requires verbal cues General Comments: patient completing short blessed test socring 4/28 (normal) deficits seen with recall and sequencing (but able to self correct).  Pt with difficulty recalling room number, but able to locate room without difficulty after being told. Requires redirection for safety and demonstrates poor attention.     General Comments  spouse present and supportive, VSS    Exercises     Shoulder Instructions      Home Living Family/patient expects to be discharged to:: Private residence Living Arrangements: Spouse/significant other Available Help at Discharge: Family Type of Home: House Home Access: Stairs to enter Secretary/administrator of Steps: 3 Entrance Stairs-Rails: Left Home Layout: One level     Bathroom Shower/Tub: Producer, television/film/video: Handicapped height     Home Equipment: Agricultural consultant (2 wheels);Shower seat - built in;Cane - single point;Grab bars - tub/shower;BSC/3in1          Prior Functioning/Environment Prior Level of Function : Needs assist (pt poor historian)             Mobility Comments: independent ADLs Comments: pt reports independent, but spouse reports she assists with med mgmt and IADLS, pt does not drive        OT Problem List: Decreased activity tolerance;Decreased cognition;Decreased coordination;Impaired balance (sitting and/or standing)      OT Treatment/Interventions: Self-care/ADL training;Neuromuscular education;DME and/or AE instruction;Therapeutic activities;Balance training;Patient/family education;Cognitive remediation/compensation    OT Goals(Current goals can be found in the care plan section) Acute Rehab OT Goals Patient Stated Goal: home OT Goal Formulation: With patient Time For Goal Achievement:  05/24/23 Potential to Achieve Goals: Good  OT Frequency: Min 2X/week    Co-evaluation              AM-PAC OT "6 Clicks" Daily Activity     Outcome Measure Help from another person eating meals?: None Help from another person taking care of personal grooming?: A Little Help from another person toileting, which includes using toliet, bedpan, or urinal?: A Little Help from another person bathing (including washing, rinsing, drying)?: A Little Help from another person to put on and taking off regular upper body clothing?: A Little Help from another person to put on and taking off regular lower body clothing?: A Little 6 Click Score: 19   End of Session Equipment Utilized During Treatment: Gait belt Nurse Communication: Mobility status  Activity Tolerance: Patient tolerated treatment well Patient left: in chair;with call bell/phone within reach;with chair alarm set;with family/visitor present  OT Visit Diagnosis: Other abnormalities of gait and mobility (R26.89);Other symptoms and signs involving cognitive function                Time: 1005-1030 OT Time Calculation (min): 25 min Charges:  OT General Charges $OT Visit: 1 Visit OT Evaluation $OT Eval Moderate Complexity: 1 Mod  [name] [name], OT Acute Rehabilitation Services Office [phone]   Chancy Milroy 05/10/2023, 11:18 AM

## 2023-05-10 NOTE — Progress Notes (Signed)
SLP Cancellation Note  Patient Details Name: [name] [name] MRN: [id number] DOB: 1946-04-02   Cancelled treatment:       Reason Eval/Treat Not Completed: Patient's level of consciousness. Per RN, patient had just gotten back into bed after a long day, requested hold off on cognitive evaluation. SLP will return another date.   Angela Nevin, MA, CCC-SLP Speech Therapy

## 2023-05-10 NOTE — Evaluation (Signed)
Physical Therapy Evaluation Patient Details Name: [name] [name] MRN: [id number] DOB: [birth year] Today's Date: 05/10/2023  History of Present Illness  Patient is 77 y.o. male presenting on 7/2 for planned bifrontal craniotomy, resection of tumor likely consistent with meningioma. PMH includes: CAD, colon cancer, DM, HTN, MI, PVC, ACDF, CABG x 3.   Clinical Impression  Pt presents with condition above and deficits mentioned below, see PT Problem List. PTA, he was independent without an AD, living with his wife in a 1-level house with 3 STE. Currently, pt displays deficits in cognition, endurance, and balance, but his wife reports his functional mobility and activity tolerance is much better now than prior to surgery. Pt did display x1 LOB initially in which minA was provided for pt to safely recover, but then pt was able to ambulate and navigate stairs the remainder of the session at a min guard assist level with only mild trunk sway and no further LOB bouts. Will continue to follow acutely. Recommending follow-up with OPPT.       Assistance Recommended at Discharge Intermittent Supervision/Assistance  If plan is discharge home, recommend the following:  Can travel by private vehicle  A little help with walking and/or transfers;A little help with bathing/dressing/bathroom;Assistance with cooking/housework;Direct supervision/assist for financial management;Direct supervision/assist for medications management;Assist for transportation;Help with stairs or ramp for entrance        Equipment Recommendations None recommended by PT  Recommendations for Other Services       Functional Status Assessment Patient has had a recent decline in their functional status and demonstrates the ability to make significant improvements in function in a reasonable and predictable amount of time.     Precautions / Restrictions Precautions Precautions: Fall Precaution Comments: JP drain Restrictions Weight  Bearing Restrictions: No      Mobility  Bed Mobility               General bed mobility comments: OOB upon entry    Transfers Overall transfer level: Needs assistance Equipment used: None Transfers: Sit to/from Stand Sit to Stand: Min guard           General transfer comment: for safety, mild unsteadiness    Ambulation/Gait Ambulation/Gait assistance: Min guard, Min assist, +2 safety/equipment Gait Distance (Feet): 430 Feet Assistive device: None Gait Pattern/deviations: Step-through pattern, Decreased stride length Gait velocity: reduced Gait velocity interpretation: 1.31 - 2.62 ft/sec, indicative of limited community ambulator   General Gait Details: Pt had a minor LOB within the first few steps, in which pt reported his "ankle gave way", minA to ensure pt recovery. Otherwise, no further LOB bouts throughout the session. Mild sway noted intermittently though. Min guard assist remainder of session  Stairs Stairs: Yes Stairs assistance: Min guard Stair Management: One rail Left, One rail Right, Alternating pattern, Forwards Number of Stairs: 3 General stair comments: Ascends and descends stairs without LOB, min guard for safety, L rail up, R rail down to simulate home  Wheelchair Mobility     Tilt Bed    Modified Rankin (Stroke Patients Only)       Balance Overall balance assessment: Mild deficits observed, not formally tested                                           Pertinent Vitals/Pain Pain Assessment Pain Assessment: No/denies pain    Home Living Family/patient expects to be discharged  to:: Private residence Living Arrangements: Spouse/significant other Available Help at Discharge: Family Type of Home: House Home Access: Stairs to enter Entrance Stairs-Rails: Left Entrance Stairs-Number of Steps: 3   Home Layout: One level Home Equipment: Agricultural consultant (2 wheels);Shower seat - built in;Cane - single point;Grab bars -  tub/shower;BSC/3in1      Prior Function Prior Level of Function : Needs assist (pt poor historian)             Mobility Comments: independent, no AD ADLs Comments: pt reports independent, but spouse reports she assists with med mgmt and IADLS, pt does not drive     Hand Dominance   Dominant Hand: Right    Extremity/Trunk Assessment   Upper Extremity Assessment Upper Extremity Assessment: Defer to OT evaluation RUE Deficits / Details: mild decreased coordination with finger to nose testing, otherwise WFL RUE Coordination: decreased gross motor LUE Deficits / Details: mild decreased coordination with finger to nose testing, otherwise WFL LUE Coordination: decreased gross motor    Lower Extremity Assessment Lower Extremity Assessment: Overall WFL for tasks assessed (coordination and sensation intact bil; MMT socres of 4+ to 5 grossly bil, seemingly symmetrical)       Communication   Communication: No difficulties  Cognition Arousal/Alertness: Awake/alert Behavior During Therapy: WFL for tasks assessed/performed Overall Cognitive Status: Impaired/Different from baseline Area of Impairment: Memory, Problem solving, Attention, Awareness                   Current Attention Level: Selective Memory: Decreased recall of precautions, Decreased short-term memory     Awareness: Emergent Problem Solving: Slow processing, Requires verbal cues General Comments: patient completing short blessed test socring 4/28 (normal) deficits seen with recall and sequencing (but able to self correct).  Pt with difficulty recalling room number, but able to locate room without difficulty after being told. Requires redirection for safety and demonstrates poor attention.        General Comments General comments (skin integrity, edema, etc.): VSS on RA; spouse reports pt is better now than prior to surgery    Exercises     Assessment/Plan    PT Assessment Patient needs continued PT  services  PT Problem List Decreased activity tolerance;Decreased mobility;Decreased balance;Decreased cognition       PT Treatment Interventions DME instruction;Gait training;Stair training;Functional mobility training;Therapeutic activities;Therapeutic exercise;Neuromuscular re-education;Balance training;Cognitive remediation;Patient/family education    PT Goals (Current goals can be found in the Care Plan section)  Acute Rehab PT Goals Patient Stated Goal: to go home tomorrow PT Goal Formulation: With patient/family Time For Goal Achievement: 05/24/23 Potential to Achieve Goals: Good    Frequency Min 4X/week     Co-evaluation PT/OT/SLP Co-Evaluation/Treatment: Yes Reason for Co-Treatment: For patient/therapist safety;To address functional/ADL transfers;Other (comment) (unsure if pt would tolerate x2 sessions as pt seemed a little upset initially about having to mobilize or work with therapy) PT goals addressed during session: Mobility/safety with mobility;Balance         AM-PAC PT "6 Clicks" Mobility  Outcome Measure Help needed turning from your back to your side while in a flat bed without using bedrails?: A Little Help needed moving from lying on your back to sitting on the side of a flat bed without using bedrails?: A Little Help needed moving to and from a bed to a chair (including a wheelchair)?: A Little Help needed standing up from a chair using your arms (e.g., wheelchair or bedside chair)?: A Little Help needed to walk in hospital room?: A Little  Help needed climbing 3-5 steps with a railing? : A Little 6 Click Score: 18    End of Session Equipment Utilized During Treatment: Gait belt Activity Tolerance: Patient tolerated treatment well Patient left: in chair;with call bell/phone within reach;with chair alarm set Nurse Communication: Mobility status PT Visit Diagnosis: Unsteadiness on feet (R26.81);Other abnormalities of gait and mobility (R26.89);Other symptoms  and signs involving the nervous system (R29.898)    Time: 1001-1030 PT Time Calculation (min) (ACUTE ONLY): 29 min   Charges:   PT Evaluation $PT Eval Moderate Complexity: 1 Mod   PT General Charges $$ ACUTE PT VISIT: 1 Visit         [name] [name], PT, DPT Acute Rehabilitation Services  Office: [phone]   [name] [name] 05/10/2023, 1:12 PM

## 2023-05-10 NOTE — Progress Notes (Signed)
  NEUROSURGERY PROGRESS NOTE   No issues overnight. Pt ambulating well with PT. Has some HA, no new visual changes or N/T/W.  EXAM:  BP (!) 144/65   Pulse (!) 56   Temp 98.1 F (36.7 C)   Resp 16   Ht 5\' 5"  (1.651 m)   Wt 83 kg   SpO2 98%   BMI 30.45 kg/m   Awake, alert, oriented  Speech fluent, appropriate  CN grossly intact  5/5 BUE/BLE  Wound c/d/I, JP in place, serosanguinous drainage  IMPRESSION:  77 y.o. male POD#2 s/p bifrontal crani for resection of meningioma, doing well  PLAN: - Can transfer to stepdown - Likely d/c drain tomorrow - Cont to mobilize  Lisbeth Renshaw, MD [facility]

## 2023-05-11 LAB — GLUCOSE, CAPILLARY
Glucose-Capillary: 230 mg/dL — ABNORMAL HIGH (ref 70–99)
Glucose-Capillary: 286 mg/dL — ABNORMAL HIGH (ref 70–99)
Glucose-Capillary: 269 mg/dL — ABNORMAL HIGH (ref 70–99)
Glucose-Capillary: 289 mg/dL — ABNORMAL HIGH (ref 70–99)

## 2023-05-11 LAB — SURGICAL PATHOLOGY

## 2023-05-11 MED ORDER — LEVETIRACETAM 500 MG PO TABS
500.0000 mg | ORAL_TABLET | Freq: Two times a day (BID) | ORAL | Status: DC
  Administered 2023-05-11 – 2023-05-12 (×2): 500 mg via ORAL
  Filled 2023-05-11 (×2): qty 1

## 2023-05-11 MED ORDER — INSULIN ASPART 100 UNIT/ML IJ SOLN
2.0000 [IU] | Freq: Three times a day (TID) | INTRAMUSCULAR | Status: DC
  Administered 2023-05-11 – 2023-05-12 (×2): 2 [IU] via SUBCUTANEOUS

## 2023-05-11 MED ORDER — MORPHINE SULFATE (PF) 2 MG/ML IV SOLN
2.0000 mg | Freq: Once | INTRAVENOUS | Status: AC
  Administered 2023-05-11: 2 mg via INTRAVENOUS
  Filled 2023-05-11: qty 1

## 2023-05-11 MED ORDER — CALCIUM CARBONATE ANTACID 500 MG PO CHEW
1.0000 | CHEWABLE_TABLET | Freq: Every day | ORAL | Status: DC | PRN
  Administered 2023-05-11: 200 mg via ORAL
  Filled 2023-05-11: qty 1

## 2023-05-11 NOTE — Progress Notes (Signed)
Occupational Therapy Treatment Patient Details Name: [name] [name] MRN: [id number] DOB: 12-11-1945 Today's Date: 05/11/2023   History of present illness Patient is 77 y.o. male presenting on 7/2 for planned bifrontal craniotomy, resection of tumor likely consistent with meningioma. PMH includes: CAD, colon cancer, DM, HTN, MI, PVC, ACDF, CABG x 3.   OT comments  Pt demonstrates some short term memory recall and needs cues for adls. Recommendation for pill box with wife management upon d/c. Wife present for education and agreeable to fully management including giving medication to the patient. Pt demonstrates min guard level of care needed for transfers. Recommendation for outpatient follow up.    Recommendations for follow up therapy are one component of a multi-disciplinary discharge planning process, led by the attending physician.  Recommendations may be updated based on patient status, additional functional criteria and insurance authorization.    Assistance Recommended at Discharge Intermittent Supervision/Assistance  Patient can return home with the following  Assist for transportation;Direct supervision/assist for medications management;Direct supervision/assist for financial management;Assistance with cooking/housework   Equipment Recommendations  None recommended by OT    Recommendations for Other Services      Precautions / Restrictions Precautions Precautions: Fall Precaution Comments: JP drain Restrictions Weight Bearing Restrictions: No       Mobility Bed Mobility Overal bed mobility: Independent                  Transfers Overall transfer level: Needs assistance   Transfers: Sit to/from Stand Sit to Stand: Supervision           General transfer comment: needs cues for line management     Balance Overall balance assessment: Mild deficits observed, not formally tested                                         ADL either performed or  assessed with clinical judgement   ADL Overall ADL's : Needs assistance/impaired Eating/Feeding: Set up   Grooming: Oral care;Standing Grooming Details (indicate cue type and reason): unable to locate lower denture and denture was still in patients mouth. pt laughs and said "oh its still in my mouth"                 Toilet Transfer: Hydrographic surveyor Details (indicate cue type and reason): simulated         Functional mobility during ADLs: Min guard      Extremity/Trunk Assessment Upper Extremity Assessment Upper Extremity Assessment: Generalized weakness            Vision       Perception     Praxis      Cognition Arousal/Alertness: Awake/alert Behavior During Therapy: WFL for tasks assessed/performed Overall Cognitive Status: Impaired/Different from baseline Area of Impairment: Memory, Attention                   Current Attention Level: Selective Memory: Decreased short-term memory     Awareness: Emergent Problem Solving: Slow processing General Comments: pt was able to recall therapy sessions and stated that lady said i needed 27 and i got 24. Pt makes a face in response and says "oh well." Pt at the sink searching for lower bottom denture and forgot he left it in his mouth. Discussed with wife present need to help setup medications and watch pt use the pill box at home. Pt at baseline manages  his own pill box.        Exercises Exercises: Other exercises Other Exercises Other Exercises: pt able to follow 3 step commands challenges Other Exercises: pt recalling information from SLP session showing some carry over of information Other Exercises: pt no recall of JP drain and need to d/c prior to d/c from hospital    Shoulder Instructions       General Comments VSS on RA spoiuse present. spouse reports only concern is memory deficits for d/c home    Pertinent Vitals/ Pain       Pain Assessment Pain Assessment: No/denies  pain  Home Living     Available Help at Discharge: Family Type of Home: House                                  Prior Functioning/Environment              Frequency  Min 1X/week        Progress Toward Goals  OT Goals(current goals can now be found in the care plan section)  Progress towards OT goals: Progressing toward goals  Acute Rehab OT Goals Patient Stated Goal: to go home today OT Goal Formulation: With patient Time For Goal Achievement: 05/24/23 Potential to Achieve Goals: Good ADL Goals Pt Will Perform Grooming: with modified independence;standing Pt Will Perform Lower Body Dressing: with modified independence;sit to/from stand Pt Will Transfer to Toilet: with modified independence;ambulating Pt Will Perform Tub/Shower Transfer: Tub transfer;with modified independence;ambulating Additional ADL Goal #1: Pt will complete 4 step trail making task with independence, using compesnatory techniques as needed for recall.  Plan Discharge plan remains appropriate    Co-evaluation                 AM-PAC OT "6 Clicks" Daily Activity     Outcome Measure   Help from another person eating meals?: None Help from another person taking care of personal grooming?: A Little Help from another person toileting, which includes using toliet, bedpan, or urinal?: A Little Help from another person bathing (including washing, rinsing, drying)?: A Little Help from another person to put on and taking off regular upper body clothing?: A Little Help from another person to put on and taking off regular lower body clothing?: A Little 6 Click Score: 19    End of Session Equipment Utilized During Treatment: Gait belt  OT Visit Diagnosis: Other abnormalities of gait and mobility (R26.89);Other symptoms and signs involving cognitive function   Activity Tolerance Patient tolerated treatment well   Patient Left in bed;with call bell/phone within reach;with bed alarm  set;with family/visitor present   Nurse Communication Mobility status;Precautions        Time: 1100-1115 OT Time Calculation (min): 15 min  Charges: OT General Charges $OT Visit: 1 Visit OT Treatments $Cognitive Funtion inital: Initial 15 mins   Brynn, OTR/L  Acute Rehabilitation Services Office: [phone] .   Mateo Flow 05/11/2023, 11:23 AM

## 2023-05-11 NOTE — Inpatient Diabetes Management (Signed)
Inpatient Diabetes Program Recommendations  AACE/ADA: New Consensus Statement on Inpatient Glycemic Control (2015)  Target Ranges:  Prepandial:   less than 140 mg/dL      Peak postprandial:   less than 180 mg/dL (1-2 hours)      Critically ill patients:  140 - 180 mg/dL   Lab Results  Component Value Date   GLUCAP 286 (H) 05/11/2023   HGBA1C 10.2 (H) 05/02/2023    Review of Glycemic Control  Latest Reference Range & Units 05/10/23 08:57 05/10/23 12:29 05/10/23 16:23 05/10/23 21:06 05/11/23 07:20 05/11/23 11:26  Glucose-Capillary 70 - 99 mg/dL 161 (H) 096 (H) 045 (H) 184 (H) 230 (H) 286 (H)   Diabetes history: DM 2 Outpatient Diabetes medications: Lantus 60 units qhs Current orders for Inpatient glycemic control:  Semglee 15 units qhs Metformin 1000 mg bid Novolog 0-15 units tid + hs Ensure Enlive bid between meals (40 grams of carbohydrates) Decadron 4 mg Q12 hours  Inpatient Diabetes Program Recommendations:    Note: Glucose trends increase after meal/supplement intake  -   May consider Novolog 2 units tid meal coverage if eating >50% of meals  Thanks,  Christena Deem RN, MSN, BC-ADM Inpatient Diabetes Coordinator Team Pager [phone] (8a-5p)

## 2023-05-11 NOTE — Progress Notes (Signed)
Physical Therapy Treatment Patient Details Name: [name] [name] MRN: [id number] DOB: [birth year] Today's Date: 05/11/2023   History of Present Illness Patient is 77 y.o. male presenting on 7/2 for planned bifrontal craniotomy, resection of tumor likely consistent with meningioma. PMH includes: CAD, colon cancer, DM, HTN, MI, PVC, ACDF, CABG x 3.    PT Comments  Focused session on gait and balance training. Challenged pt's dynamic standing balance through pt performing tandem forward and backward steps and lateral braided steps. Pt had multiple LOB bouts during these challenges, needing minA to recover. Will continue to follow acutely.    Assistance Recommended at Discharge Intermittent Supervision/Assistance  If plan is discharge home, recommend the following:  Can travel by private vehicle    A little help with walking and/or transfers;A little help with bathing/dressing/bathroom;Assistance with cooking/housework;Direct supervision/assist for financial management;Direct supervision/assist for medications management;Assist for transportation;Help with stairs or ramp for entrance      Equipment Recommendations  None recommended by PT    Recommendations for Other Services       Precautions / Restrictions Precautions Precautions: Fall Precaution Comments: JP drain Restrictions Weight Bearing Restrictions: No     Mobility  Bed Mobility Overal bed mobility: Modified Independent             General bed mobility comments: Supine to sit R EOB without assistance, HOB elevated    Transfers Overall transfer level: Needs assistance Equipment used: None Transfers: Sit to/from Stand Sit to Stand: Supervision           General transfer comment: Supervision for safety, mild sway noted    Ambulation/Gait Ambulation/Gait assistance: Min guard, Min assist Gait Distance (Feet): 180 Feet Assistive device: None Gait Pattern/deviations: Step-through pattern, Decreased stride  length Gait velocity: reduced Gait velocity interpretation: 1.31 - 2.62 ft/sec, indicative of limited community ambulator   General Gait Details: Pt able to ambulate without UE support with mild trunk sway but no LOB when not challenged,min guard for safety. When cued to walk with forward and backward tandem steps and perform braided lateral steps he did have LOB bouts, needing minA to recover, or preferring UE support on the wall   Stairs             Wheelchair Mobility     Tilt Bed    Modified Rankin (Stroke Patients Only)       Balance Overall balance assessment: Mild deficits observed, not formally tested                                          Cognition Arousal/Alertness: Awake/alert Behavior During Therapy: WFL for tasks assessed/performed Overall Cognitive Status: Impaired/Different from baseline Area of Impairment: Memory, Problem solving, Attention, Awareness                   Current Attention Level: Selective Memory: Decreased short-term memory     Awareness: Emergent Problem Solving: Slow processing General Comments: Pt's gown was saturated in urine upon arrival with poor awareness by pt. Educated pt of need to change gowns with pt forgetting a moment later and needing re-education multiple times.        Exercises Other Exercises Other Exercises: forward and backward tandem steps with intermittent 1 UE support on the wall, x > 15 steps each way, up to minA to recover LOB bouts Other Exercises: lateral braided steps x > 6 steps without  UE support, minA to recover LOB bouts    General Comments General comments (skin integrity, edema, etc.): VSS on RA spoiuse present. spouse reports only concern is memory deficits for d/c home      Pertinent Vitals/Pain Pain Assessment Pain Assessment: Faces Faces Pain Scale: Hurts little more Pain Location: eyes, headache Pain Descriptors / Indicators: Discomfort, Headache Pain  Intervention(s): Limited activity within patient's tolerance, Monitored during session, Premedicated before session, Repositioned    Home Living     Available Help at Discharge: Family Type of Home: House                  Prior Function            PT Goals (current goals can now be found in the care plan section) Acute Rehab PT Goals Patient Stated Goal: to go home PT Goal Formulation: With patient/family Time For Goal Achievement: 05/24/23 Potential to Achieve Goals: Good Progress towards PT goals: Progressing toward goals    Frequency    Min 4X/week      PT Plan Current plan remains appropriate    Co-evaluation              AM-PAC PT "6 Clicks" Mobility   Outcome Measure  Help needed turning from your back to your side while in a flat bed without using bedrails?: A Little Help needed moving from lying on your back to sitting on the side of a flat bed without using bedrails?: A Little Help needed moving to and from a bed to a chair (including a wheelchair)?: A Little Help needed standing up from a chair using your arms (e.g., wheelchair or bedside chair)?: A Little Help needed to walk in hospital room?: A Little Help needed climbing 3-5 steps with a railing? : A Little 6 Click Score: 18    End of Session Equipment Utilized During Treatment: Gait belt Activity Tolerance: Patient tolerated treatment well Patient left: with family/visitor present;Other (comment) (standing with OT)   PT Visit Diagnosis: Unsteadiness on feet (R26.81);Other abnormalities of gait and mobility (R26.89);Other symptoms and signs involving the nervous system (R29.898)     Time: 1045-1100 PT Time Calculation (min) (ACUTE ONLY): 15 min  Charges:    $Gait Training: 8-22 mins PT General Charges $$ ACUTE PT VISIT: 1 Visit                     Raymond Gurney, PT, DPT Acute Rehabilitation Services  Office: [phone]    Jewel Baize 05/11/2023, 11:44 AM

## 2023-05-11 NOTE — Progress Notes (Signed)
   Providing Compassionate, Quality Care - Together  NEUROSURGERY PROGRESS NOTE   S: No issues overnight.   O: EXAM:  BP (!) 152/66 (BP Location: Left Arm)   Pulse (!) 53   Temp 97.7 F (36.5 C) (Oral)   Resp 16   Ht 5\' 5"  (1.651 m)   Wt 83 kg   SpO2 98%   BMI 30.45 kg/m   Awake, alert, oriented x3 PERRL Speech fluent, appropriate  CNs grossly intact  5/5 BUE/BLE  Dressing clean dry and intact, JP in place  ASSESSMENT:  77 y.o. male with  Large bifrontal meningioma   Status post crani for resection, 05/08/2023   PLAN: -DC planning tomorrow  -DC JP -Decadron taper -add novolog    Thank you for allowing me to participate in this patient's care.  Please do not hesitate to call with questions or concerns.   Monia Pouch, DO Neurosurgeon [facility] Cell: [phone]

## 2023-05-11 NOTE — Care Management Important Message (Signed)
Important Message  Patient Details  Name: [name] [name] MRN: [id number] Date of Birth: September 30, 1946   Medicare Important Message Given:  Yes     Dorena Bodo 05/11/2023, 2:42 PM
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
## 2023-05-11 NOTE — Progress Notes (Signed)
The patient is requesting for something for heartburn. Notified Dr. Conchita Paris and received order for Tums. Will administer and continue to monitor.

## 2023-05-12 LAB — GLUCOSE, CAPILLARY: Glucose-Capillary: 272 mg/dL — ABNORMAL HIGH (ref 70–99)

## 2023-05-12 MED ORDER — DEXAMETHASONE 1 MG PO TABS: ORAL_TABLET | ORAL | 0 refills | Status: AC

## 2023-05-12 MED ORDER — HYDROCODONE-ACETAMINOPHEN 5-325 MG PO TABS: 1.0000 | ORAL_TABLET | ORAL | 0 refills | Status: DC | PRN

## 2023-05-12 MED ORDER — LEVETIRACETAM 500 MG PO TABS: 500.0000 mg | ORAL_TABLET | Freq: Two times a day (BID) | ORAL | 0 refills | Status: DC

## 2023-05-12 MED ORDER — ASPIRIN 81 MG PO TABS: 81.0000 mg | ORAL_TABLET | Freq: Every day | ORAL | 0 refills | Status: AC

## 2023-05-12 NOTE — Progress Notes (Signed)
Patient removed his hydrocolloid dressing this morning, stated it's itching. Notified Dr. Conchita Paris without any new order. Will continue to monitor.

## 2023-05-12 NOTE — Discharge Summary (Signed)
Physician Discharge Summary  Patient ID: [name] [name] MRN: [id number] DOB/AGE: December 19, 1945 77 y.o.  Admit date: 05/08/2023 Discharge date: 05/12/2023  Admission Diagnoses:  Bifrontal extra-axial tumor with regional mass effect  Discharge Diagnoses:  Same Principal Problem:   Brain tumor [facility]) Active Problems:   S/P craniotomy   Discharged Condition: Stable  Hospital Course:  [name] [name] is a 77 y.o. male underwent an elective bifrontal craniotomy for resection of extra-axial tumor.  He tolerated surgery well, was monitored in the ICU postoperatively.  He progressed appropriately, was ambulating independently.  His pain was controlled on oral medication upon discharge.  He was having normal bowel bladder function upon discharge.  PT/OT evaluated work with the patient and agreed home placement.  Treatments: Surgery -bifrontal craniotomy for resection of tumor  Discharge Exam: Blood pressure (!) 151/72, pulse (!) 54, temperature 98.4 F (36.9 C), temperature source Oral, resp. rate 15, height 5\' 5"  (1.651 m), weight 83 kg, SpO2 98 %. Awake, alert, oriented x 2 Speech fluent, appropriate CN grossly intact 5/5 BUE/BLE Wound c/d/i  Disposition: Discharge disposition: 01-Home or Self Care        Allergies as of 05/12/2023   No Known Allergies      Medication List     STOP taking these medications    traMADol 50 MG tablet Commonly known as: Ultram       TAKE these medications    acetaminophen 325 MG tablet Commonly known as: TYLENOL Take 2 tablets (650 mg total) by mouth every 6 (six) hours as needed for mild pain (or Fever >/= 101).   aspirin 81 MG tablet Take 1 tablet (81 mg total) by mouth daily. Start taking on: May 18, 2023 What changed: These instructions start on May 18, 2023. If you are unsure what to do until then, ask your doctor or other care provider.   atorvastatin 10 MG tablet Commonly known as: LIPITOR Take 10 mg by mouth at bedtime.    dexamethasone 1 MG tablet Commonly known as: DECADRON Take 2 tablets (2 mg total) by mouth with breakfast, with lunch, and with evening meal for 3 days, THEN 2 tablets (2 mg total) 2 (two) times daily with a meal for 3 days, THEN 1 tablet (1 mg total) 2 (two) times daily with a meal for 3 days, THEN 1 tablet (1 mg total) daily with breakfast for 3 days. Start taking on: May 12, 2023 What changed:  medication strength See the new instructions.   glipiZIDE 10 MG 24 hr tablet Commonly known as: GLUCOTROL XL Take 20 mg by mouth daily.   HYDROcodone-acetaminophen 5-325 MG tablet Commonly known as: NORCO/VICODIN Take 1 tablet by mouth every 4 (four) hours as needed for moderate pain.   insulin glargine 100 UNIT/ML Solostar Pen Commonly known as: LANTUS Inject 60 Units into the skin daily.   levETIRAcetam 500 MG tablet Commonly known as: KEPPRA Take 1 tablet (500 mg total) by mouth every 12 (twelve) hours for 14 days.   levothyroxine 75 MCG tablet Commonly known as: SYNTHROID Take 75 mcg by mouth every morning.   memantine 5 MG tablet Commonly known as: NAMENDA Take 1 tablet (5 mg at night) for 2 weeks, then increase to 1 tablet (5 mg) twice a day   metFORMIN 1000 MG tablet Commonly known as: GLUCOPHAGE Take 1,000 mg by mouth 2 (two) times daily.   metoprolol tartrate 25 MG tablet Commonly known as: LOPRESSOR Take 25 mg by mouth daily at 6 (six) AM.  nitroGLYCERIN 0.4 MG SL tablet Commonly known as: NITROSTAT Place 1 tablet (0.4 mg total) under the tongue every 5 (five) minutes as needed for chest pain.   ondansetron 4 MG tablet Commonly known as: ZOFRAN Take 1 tablet (4 mg total) by mouth every 6 (six) hours as needed for nausea.   pantoprazole 40 MG tablet Commonly known as: PROTONIX Take 1 tablet (40 mg total) by mouth 2 (two) times daily before a meal. What changed: when to take this   polyethylene glycol 17 g packet Commonly known as: MIRALAX / GLYCOLAX Take 17  g by mouth daily as needed for mild constipation.   senna-docusate 8.6-50 MG tablet Commonly known as: Senokot-S Take 1 tablet by mouth at bedtime as needed for mild constipation.   solifenacin 10 MG tablet Commonly known as: VESICARE Take 10 mg by mouth daily.   Vitamin D3 50 MCG (2000 UT) Tabs Take 2,000 Units by mouth daily.        Follow-up Information     Saadiq Poche C, DO Follow up in 2 week(s).   Contact information: [address] [place] [address] [place] [address] [phone]                 Signed: Alan Mulder Marvell Stavola 05/12/2023, 9:07 AM

## 2023-05-21 ENCOUNTER — Emergency Department (HOSPITAL_COMMUNITY): Payer: Medicare Other

## 2023-05-21 ENCOUNTER — Other Ambulatory Visit: Payer: Self-pay

## 2023-05-21 ENCOUNTER — Inpatient Hospital Stay (HOSPITAL_COMMUNITY)
Admission: EM | Admit: 2023-05-21 | Discharge: 2023-05-25 | DRG: 884 | Disposition: A | Discharge status: Rehabilitation Facility | Payer: Medicare Other | Attending: Neurosurgery | Admitting: Neurosurgery

## 2023-05-21 DIAGNOSIS — R4587 Impulsiveness: Secondary | ICD-10-CM | POA: Diagnosis not present

## 2023-05-21 DIAGNOSIS — D320 Benign neoplasm of cerebral meninges: Secondary | ICD-10-CM | POA: Diagnosis not present

## 2023-05-21 DIAGNOSIS — Z794 Long term (current) use of insulin: Secondary | ICD-10-CM | POA: Diagnosis not present

## 2023-05-21 DIAGNOSIS — Z6829 Body mass index (BMI) 29.0-29.9, adult: Secondary | ICD-10-CM | POA: Diagnosis not present

## 2023-05-21 DIAGNOSIS — I10 Essential (primary) hypertension: Secondary | ICD-10-CM | POA: Diagnosis present

## 2023-05-21 DIAGNOSIS — I252 Old myocardial infarction: Secondary | ICD-10-CM

## 2023-05-21 DIAGNOSIS — D329 Benign neoplasm of meninges, unspecified: Secondary | ICD-10-CM | POA: Diagnosis not present

## 2023-05-21 DIAGNOSIS — Z833 Family history of diabetes mellitus: Secondary | ICD-10-CM

## 2023-05-21 DIAGNOSIS — E785 Hyperlipidemia, unspecified: Secondary | ICD-10-CM | POA: Diagnosis present

## 2023-05-21 DIAGNOSIS — Z7984 Long term (current) use of oral hypoglycemic drugs: Secondary | ICD-10-CM | POA: Diagnosis not present

## 2023-05-21 DIAGNOSIS — E11649 Type 2 diabetes mellitus with hypoglycemia without coma: Secondary | ICD-10-CM | POA: Diagnosis not present

## 2023-05-21 DIAGNOSIS — E039 Hypothyroidism, unspecified: Secondary | ICD-10-CM | POA: Diagnosis present

## 2023-05-21 DIAGNOSIS — Z79890 Hormone replacement therapy (postmenopausal): Secondary | ICD-10-CM

## 2023-05-21 DIAGNOSIS — I2510 Atherosclerotic heart disease of native coronary artery without angina pectoris: Secondary | ICD-10-CM | POA: Diagnosis present

## 2023-05-21 DIAGNOSIS — E1165 Type 2 diabetes mellitus with hyperglycemia: Secondary | ICD-10-CM | POA: Diagnosis present

## 2023-05-21 DIAGNOSIS — R32 Unspecified urinary incontinence: Secondary | ICD-10-CM | POA: Diagnosis present

## 2023-05-21 DIAGNOSIS — F03911 Unspecified dementia, unspecified severity, with agitation: Secondary | ICD-10-CM | POA: Diagnosis not present

## 2023-05-21 DIAGNOSIS — R519 Headache, unspecified: Secondary | ICD-10-CM | POA: Diagnosis not present

## 2023-05-21 DIAGNOSIS — R569 Unspecified convulsions: Secondary | ICD-10-CM | POA: Diagnosis not present

## 2023-05-21 DIAGNOSIS — Z87891 Personal history of nicotine dependence: Secondary | ICD-10-CM | POA: Diagnosis not present

## 2023-05-21 DIAGNOSIS — R0602 Shortness of breath: Secondary | ICD-10-CM | POA: Diagnosis not present

## 2023-05-21 DIAGNOSIS — K219 Gastro-esophageal reflux disease without esophagitis: Secondary | ICD-10-CM | POA: Diagnosis present

## 2023-05-21 DIAGNOSIS — Z951 Presence of aortocoronary bypass graft: Secondary | ICD-10-CM

## 2023-05-21 DIAGNOSIS — Z79899 Other long term (current) drug therapy: Secondary | ICD-10-CM | POA: Diagnosis not present

## 2023-05-21 DIAGNOSIS — Z8249 Family history of ischemic heart disease and other diseases of the circulatory system: Secondary | ICD-10-CM

## 2023-05-21 DIAGNOSIS — Z86011 Personal history of benign neoplasm of the brain: Secondary | ICD-10-CM | POA: Diagnosis not present

## 2023-05-21 DIAGNOSIS — E871 Hypo-osmolality and hyponatremia: Secondary | ICD-10-CM | POA: Diagnosis present

## 2023-05-21 DIAGNOSIS — F0670 Mild neurocognitive disorder due to known physiological condition without behavioral disturbance: Secondary | ICD-10-CM | POA: Diagnosis not present

## 2023-05-21 DIAGNOSIS — G3184 Mild cognitive impairment, so stated: Secondary | ICD-10-CM | POA: Diagnosis not present

## 2023-05-21 DIAGNOSIS — R41844 Frontal lobe and executive function deficit: Secondary | ICD-10-CM | POA: Diagnosis not present

## 2023-05-21 DIAGNOSIS — R7989 Other specified abnormal findings of blood chemistry: Secondary | ICD-10-CM | POA: Diagnosis not present

## 2023-05-21 DIAGNOSIS — R627 Adult failure to thrive: Secondary | ICD-10-CM | POA: Diagnosis present

## 2023-05-21 DIAGNOSIS — Z85038 Personal history of other malignant neoplasm of large intestine: Secondary | ICD-10-CM

## 2023-05-21 DIAGNOSIS — Z7982 Long term (current) use of aspirin: Secondary | ICD-10-CM | POA: Diagnosis not present

## 2023-05-21 DIAGNOSIS — R451 Restlessness and agitation: Secondary | ICD-10-CM | POA: Diagnosis not present

## 2023-05-21 LAB — URINALYSIS, W/ REFLEX TO CULTURE (INFECTION SUSPECTED)
Bilirubin Urine: NEGATIVE
Glucose, UA: >=500 mg/dL — AB
Hgb urine dipstick: NEGATIVE
Ketones, ur: NEGATIVE mg/dL
Leukocytes,Ua: NEGATIVE
Nitrite: NEGATIVE
Protein, ur: NEGATIVE mg/dL
Specific Gravity, Urine: 1.017 (ref 1.005–1.030)
pH: 5 (ref 5.0–8.0)

## 2023-05-21 LAB — COMPREHENSIVE METABOLIC PANEL
ALT: 36 U/L (ref 0–44)
AST: 27 U/L (ref 15–41)
Albumin: 3 g/dL — ABNORMAL LOW (ref 3.5–5.0)
Alkaline Phosphatase: 75 U/L (ref 38–126)
Anion gap: 12 (ref 5–15)
BUN: 19 mg/dL (ref 8–23)
CO2: 22 mmol/L (ref 22–32)
Calcium: 9.1 mg/dL (ref 8.9–10.3)
Chloride: 97 mmol/L — ABNORMAL LOW (ref 98–111)
Creatinine, Ser: 1.11 mg/dL (ref 0.61–1.24)
GFR, Estimated: >60 mL/min (ref >60)
Glucose, Bld: 268 mg/dL — ABNORMAL HIGH (ref 70–99)
Potassium: 4.6 mmol/L (ref 3.5–5.1)
Sodium: 131 mmol/L — ABNORMAL LOW (ref 135–145)
Total Bilirubin: 0.5 mg/dL (ref 0.3–1.2)
Total Protein: 5.9 g/dL — ABNORMAL LOW (ref 6.5–8.1)

## 2023-05-21 LAB — CBC
HCT: 40.9 % (ref 39.0–52.0)
Hemoglobin: 13.6 g/dL (ref 13.0–17.0)
MCH: 32.3 pg (ref 26.0–34.0)
MCHC: 33.3 g/dL (ref 30.0–36.0)
MCV: 97.1 fL (ref 80.0–100.0)
Platelets: 185 10*3/uL (ref 150–400)
RBC: 4.21 MIL/uL — ABNORMAL LOW (ref 4.22–5.81)
RDW: 13.2 % (ref 11.5–15.5)
WBC: 11.3 10*3/uL — ABNORMAL HIGH (ref 4.0–10.5)
nRBC: 0 % (ref 0.0–0.2)

## 2023-05-21 LAB — CBG MONITORING, ED: Glucose-Capillary: 169 mg/dL — ABNORMAL HIGH (ref 70–99)

## 2023-05-21 MED ORDER — PANTOPRAZOLE SODIUM 40 MG PO TBEC
40.0000 mg | DELAYED_RELEASE_TABLET | Freq: Every day | ORAL | Status: DC
  Administered 2023-05-21 – 2023-05-25 (×5): 40 mg via ORAL
  Filled 2023-05-21 (×5): qty 1

## 2023-05-21 MED ORDER — FESOTERODINE FUMARATE ER 4 MG PO TB24
4.0000 mg | ORAL_TABLET | Freq: Every day | ORAL | Status: DC
  Administered 2023-05-22 – 2023-05-25 (×4): 4 mg via ORAL
  Filled 2023-05-21 (×4): qty 1

## 2023-05-21 MED ORDER — INSULIN ASPART 100 UNIT/ML IJ SOLN
0.0000 [IU] | Freq: Three times a day (TID) | INTRAMUSCULAR | Status: DC
  Administered 2023-05-22: 4 [IU] via SUBCUTANEOUS
  Administered 2023-05-22: 11 [IU] via SUBCUTANEOUS
  Administered 2023-05-22: 4 [IU] via SUBCUTANEOUS
  Administered 2023-05-23 – 2023-05-24 (×4): 3 [IU] via SUBCUTANEOUS
  Administered 2023-05-24: 7 [IU] via SUBCUTANEOUS
  Administered 2023-05-25: 11 [IU] via SUBCUTANEOUS
  Administered 2023-05-25: 7 [IU] via SUBCUTANEOUS
  Administered 2023-05-25: 4 [IU] via SUBCUTANEOUS

## 2023-05-21 MED ORDER — ACETAMINOPHEN 325 MG PO TABS: 650.0000 mg | ORAL_TABLET | Freq: Four times a day (QID) | ORAL | Status: DC | PRN

## 2023-05-21 MED ORDER — SODIUM CHLORIDE 0.9% FLUSH: 3.0000 mL | INTRAVENOUS | Status: DC | PRN

## 2023-05-21 MED ORDER — METOPROLOL TARTRATE 25 MG PO TABS
25.0000 mg | ORAL_TABLET | Freq: Every day | ORAL | Status: DC
  Administered 2023-05-22 – 2023-05-25 (×4): 25 mg via ORAL
  Filled 2023-05-21 (×4): qty 1

## 2023-05-21 MED ORDER — ACETAMINOPHEN 650 MG RE SUPP: 650.0000 mg | Freq: Four times a day (QID) | RECTAL | Status: DC | PRN

## 2023-05-21 MED ORDER — ONDANSETRON HCL 4 MG PO TABS: 4.0000 mg | ORAL_TABLET | Freq: Four times a day (QID) | ORAL | Status: DC | PRN

## 2023-05-21 MED ORDER — INSULIN GLARGINE-YFGN 100 UNIT/ML ~~LOC~~ SOLN: 60.0000 [IU] | Freq: Every day | SUBCUTANEOUS | Status: DC

## 2023-05-21 MED ORDER — INSULIN GLARGINE-YFGN 100 UNIT/ML ~~LOC~~ SOLN
60.0000 [IU] | Freq: Every day | SUBCUTANEOUS | Status: DC
  Filled 2023-05-21: qty 0.6

## 2023-05-21 MED ORDER — ATORVASTATIN CALCIUM 10 MG PO TABS
10.0000 mg | ORAL_TABLET | Freq: Every day | ORAL | Status: DC
  Administered 2023-05-21 – 2023-05-24 (×4): 10 mg via ORAL
  Filled 2023-05-21 (×4): qty 1

## 2023-05-21 MED ORDER — VITAMIN B-12 1000 MCG PO TABS
1000.0000 ug | ORAL_TABLET | Freq: Every day | ORAL | Status: DC
  Administered 2023-05-22 – 2023-05-25 (×4): 1000 ug via ORAL
  Filled 2023-05-21 (×4): qty 1

## 2023-05-21 MED ORDER — HYDROCODONE-ACETAMINOPHEN 5-325 MG PO TABS
1.0000 | ORAL_TABLET | ORAL | Status: DC | PRN
  Administered 2023-05-21: 2 via ORAL
  Filled 2023-05-21: qty 2

## 2023-05-21 MED ORDER — MEMANTINE HCL 10 MG PO TABS
5.0000 mg | ORAL_TABLET | Freq: Two times a day (BID) | ORAL | Status: DC
  Administered 2023-05-21 – 2023-05-25 (×8): 5 mg via ORAL
  Filled 2023-05-21 (×8): qty 1

## 2023-05-21 MED ORDER — VITAMIN D 25 MCG (1000 UNIT) PO TABS
2000.0000 [IU] | ORAL_TABLET | Freq: Every day | ORAL | Status: DC
  Administered 2023-05-22 – 2023-05-25 (×4): 2000 [IU] via ORAL
  Filled 2023-05-21 (×4): qty 2

## 2023-05-21 MED ORDER — ONDANSETRON HCL 4 MG/2ML IJ SOLN: 4.0000 mg | Freq: Four times a day (QID) | INTRAMUSCULAR | Status: DC | PRN

## 2023-05-21 MED ORDER — DOCUSATE SODIUM 100 MG PO CAPS
100.0000 mg | ORAL_CAPSULE | Freq: Two times a day (BID) | ORAL | Status: DC
  Administered 2023-05-22 – 2023-05-25 (×6): 100 mg via ORAL
  Filled 2023-05-21 (×7): qty 1

## 2023-05-21 MED ORDER — SODIUM CHLORIDE 0.9% FLUSH
3.0000 mL | Freq: Two times a day (BID) | INTRAVENOUS | Status: DC
  Administered 2023-05-22: 3 mL via INTRAVENOUS

## 2023-05-21 MED ORDER — ASPIRIN 81 MG PO TBEC
81.0000 mg | DELAYED_RELEASE_TABLET | Freq: Every day | ORAL | Status: DC
  Administered 2023-05-22 – 2023-05-25 (×4): 81 mg via ORAL
  Filled 2023-05-21 (×4): qty 1

## 2023-05-21 MED ORDER — NITROGLYCERIN 0.4 MG SL SUBL: 0.4000 mg | SUBLINGUAL_TABLET | SUBLINGUAL | Status: DC | PRN

## 2023-05-21 MED ORDER — LEVETIRACETAM 500 MG PO TABS
500.0000 mg | ORAL_TABLET | Freq: Two times a day (BID) | ORAL | Status: DC
  Administered 2023-05-21 – 2023-05-25 (×8): 500 mg via ORAL
  Filled 2023-05-21 (×8): qty 1

## 2023-05-21 MED ORDER — METFORMIN HCL 500 MG PO TABS
1000.0000 mg | ORAL_TABLET | Freq: Two times a day (BID) | ORAL | Status: DC
  Administered 2023-05-22 – 2023-05-25 (×8): 1000 mg via ORAL
  Filled 2023-05-21 (×9): qty 2

## 2023-05-21 MED ORDER — GLIPIZIDE ER 10 MG PO TB24: 20.0000 mg | ORAL_TABLET | Freq: Every day | ORAL | Status: DC

## 2023-05-21 MED ORDER — DEXAMETHASONE 2 MG PO TABS
2.0000 mg | ORAL_TABLET | Freq: Four times a day (QID) | ORAL | Status: DC
  Administered 2023-05-21 – 2023-05-23 (×7): 2 mg via ORAL
  Filled 2023-05-21 (×7): qty 1

## 2023-05-21 MED ORDER — SODIUM CHLORIDE 0.9 % IV SOLN: 250.0000 mL | INTRAVENOUS | Status: DC | PRN

## 2023-05-21 MED ORDER — LEVOTHYROXINE SODIUM 75 MCG PO TABS
75.0000 ug | ORAL_TABLET | Freq: Every morning | ORAL | Status: DC
  Administered 2023-05-22 – 2023-05-25 (×4): 75 ug via ORAL
  Filled 2023-05-21 (×4): qty 1
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
## 2023-05-21 NOTE — ED Provider Notes (Signed)
[place] EMERGENCY DEPARTMENT AT [facility] Provider Note   CSN: [id number] Arrival date & time: 05/21/23  1546     History  Chief Complaint  Patient presents with   Altered Mental Status    [name] [name] is a 77 y.o. male.  HPI 77 year old male presents from home with altered mental status.  History is from wife and daughter.  He had a bifrontal craniotomy tumor excision on 7/2.  Since leaving and since surgery he has had worsening confusion.  Is having worsening behavior, he is staring a lot, gets angry, and bangs on things.  He is acting generally strange and will do behavior like childlike behavior or running water and leaving it on.  Has been urinating on the floor.  He is also been grabbing the wife who is a lot smaller than him and they are concerned she is going to get pulled over and injured.  Wife states she cannot take care of him. No recent fall. He did bump his head in the bathroom one time. Bruising to the forehead has been there since surgery and is slowly worsening.    Home Medications Prior to Admission medications   Medication Sig Start Date End Date Taking? Authorizing Provider  aspirin 81 MG tablet Take 1 tablet (81 mg total) by mouth daily. 05/18/23  Yes Dawley, Troy C, DO  atorvastatin (LIPITOR) 10 MG tablet Take 10 mg by mouth at bedtime.  02/22/15  Yes [provider]  Cholecalciferol (VITAMIN D3) 50 MCG (2000 UT) TABS Take 2,000 Units by mouth daily.   Yes [provider]  HYDROcodone-acetaminophen (NORCO/VICODIN) 5-325 MG tablet Take 1 tablet by mouth every 4 (four) hours as needed for moderate pain. 05/12/23  Yes Dawley, Troy C, DO  insulin glargine (LANTUS) 100 UNIT/ML Solostar Pen Inject 60 Units into the skin daily.   Yes [provider]  levothyroxine (SYNTHROID) 75 MCG tablet Take 75 mcg by mouth every morning. 01/17/23  Yes [provider]  memantine (NAMENDA) 5 MG tablet Take 1 tablet (5 mg at night) for 2  weeks, then increase to 1 tablet (5 mg) twice a day 03/06/23  Yes Wertman, Sung Amabile, PA-C  metFORMIN (GLUCOPHAGE) 1000 MG tablet Take 1,000 mg by mouth 2 (two) times daily. 09/02/18  Yes [provider]  metoprolol tartrate (LOPRESSOR) 25 MG tablet Take 25 mg by mouth daily at 6 (six) AM.   Yes [provider]  nitroGLYCERIN (NITROSTAT) 0.4 MG SL tablet Place 1 tablet (0.4 mg total) under the tongue every 5 (five) minutes as needed for chest pain. 06/24/18  Yes Kathleene Hazel, MD  ondansetron (ZOFRAN) 4 MG tablet Take 1 tablet (4 mg total) by mouth every 6 (six) hours as needed for nausea. 02/25/23  Yes Glade Lloyd, MD  pantoprazole (PROTONIX) 40 MG tablet Take 1 tablet (40 mg total) by mouth 2 (two) times daily before a meal. 02/25/23  Yes Alekh, Kshitiz, MD  polyethylene glycol (MIRALAX / GLYCOLAX) 17 g packet Take 17 g by mouth daily as needed for mild constipation. 02/25/23  Yes Glade Lloyd, MD  solifenacin (VESICARE) 10 MG tablet Take 10 mg by mouth daily. 02/05/23  Yes [provider]  acetaminophen (TYLENOL) 325 MG tablet Take 2 tablets (650 mg total) by mouth every 6 (six) hours as needed for mild pain (or Fever >/= 101). Patient not taking: Reported on 05/21/2023 04/20/23   Marguerita Merles Latif, DO  dexamethasone (DECADRON) 1 MG tablet Take 2  tablets (2 mg total) by mouth with breakfast, with lunch, and with evening meal for 3 days, THEN 2 tablets (2 mg total) 2 (two) times daily with a meal for 3 days, THEN 1 tablet (1 mg total) 2 (two) times daily with a meal for 3 days, THEN 1 tablet (1 mg total) daily with breakfast for 3 days. Patient not taking: Reported on 05/21/2023 05/12/23 05/24/23  Dawley, Kendell Bane C, DO  levETIRAcetam (KEPPRA) 500 MG tablet Take 1 tablet (500 mg total) by mouth every 12 (twelve) hours for 14 days. Patient not taking: Reported on 05/21/2023 05/12/23 05/26/23  Dawley, Alan Mulder, DO      Allergies    Patient has no known allergies.    Review of  Systems   Review of Systems  Constitutional:  Negative for fever.  Neurological:  Negative for headaches.  Psychiatric/Behavioral:  Positive for behavioral problems and confusion.     Physical Exam Updated Vital Signs BP 136/69   Pulse 69   Temp 98 F (36.7 C) (Oral)   Resp 13   Ht 5\' 6"  (1.676 m)   Wt 71.7 kg   SpO2 98%   BMI 25.50 kg/m  Physical Exam Vitals and nursing note reviewed.  Constitutional:      Appearance: He is well-developed.  HENT:     Head: Normocephalic.     Comments: Surgical wound to scalp is well appearing. C/D/I. Moderate ecchymosis to bilateral forehead. Cardiovascular:     Rate and Rhythm: Normal rate and regular rhythm.     Heart sounds: Normal heart sounds.  Pulmonary:     Effort: Pulmonary effort is normal.     Breath sounds: Normal breath sounds.  Abdominal:     Palpations: Abdomen is soft.     Tenderness: There is no abdominal tenderness.  Skin:    General: Skin is warm and dry.  Neurological:     Mental Status: He is alert. He is disoriented.     Comments: Oriented to year and place. Disoriented to day of week, month. Somewhat odd behavior. CN 3-12 grossly intact. 5/5 strength in all 4 extremities. Grossly normal sensation. Normal finger to nose.      ED Results / Procedures / Treatments   Labs (all labs ordered are listed, but only abnormal results are displayed) Labs Reviewed  COMPREHENSIVE METABOLIC PANEL - Abnormal; Notable for the following components:      Result Value   Sodium 131 (*)    Chloride 97 (*)    Glucose, Bld 268 (*)    Total Protein 5.9 (*)    Albumin 3.0 (*)    All other components within normal limits  CBC - Abnormal; Notable for the following components:   WBC 11.3 (*)    RBC 4.21 (*)    All other components within normal limits  URINALYSIS, W/ REFLEX TO CULTURE (INFECTION SUSPECTED) - Abnormal; Notable for the following components:   Glucose, UA >=500 (*)    Bacteria, UA RARE (*)    All other components  within normal limits  CBG MONITORING, ED - Abnormal; Notable for the following components:   Glucose-Capillary 169 (*)    All other components within normal limits    EKG EKG Interpretation Date/Time:  Monday May 21 2023 15:41:15 EDT Ventricular Rate:  87 PR Interval:  160 QRS Duration:  82 QT Interval:  374 QTC Calculation: 450 R Axis:   222  Text Interpretation: Normal sinus rhythm with sinus arrhythmia  right/left arm reversal Confirmed  by Pricilla Loveless [phone]) on 05/21/2023 9:51:26 PM  Radiology CT Head Wo Contrast  Addendum Date: 05/21/2023   ADDENDUM REPORT: 05/21/2023 19:10 ADDENDUM: These results were called by telephone at the time of interpretation on 05/21/2023 at 6:17 pm to provider Dr. Criss Alvine, Who verbally acknowledged these results. Electronically Signed   By: Jackey Loge D.O.   On: 05/21/2023 19:10   Result Date: 05/21/2023 CLINICAL DATA:  Mental status change, unknown cause. EXAM: CT HEAD WITHOUT CONTRAST TECHNIQUE: Contiguous axial images were obtained from the base of the skull through the vertex without intravenous contrast. RADIATION DOSE REDUCTION: This exam was performed according to the departmental dose-optimization program which includes automated exposure control, adjustment of the mA and/or kV according to patient size and/or use of iterative reconstruction technique. COMPARISON:  Prior brain MRI examinations 05/09/2023 and earlier. FINDINGS: Brain: Interval evolution of postoperative changes from recent prior frontal mass resection. Persistent epidural collection deep to the bifrontal bone flap containing pneumocephalus, fluid and postoperative blood products. This collection has increased in size from the prior brain MRI of 05/09/2023, now measuring up to 2.8 cm in thickness (previously 2.3 cm when remeasured on prior). Mass effect upon the underlying frontal lobes (left greater than right) There are foci of parenchymal hemorrhage within the anterior frontal  lobes bilaterally (measuring up to 10 mm), which were not definitively present on the prior brain MRI of 05/09/2023 (for instance as seen on series 5, image 51) (series 4, image 21) Parenchymal edema within the anterior frontal lobes. On the left, this is similar to the prior brain MRI of 05/09/2023. The edema within the anterior right frontal lobe is new from the prior MRI. Persistent effacement of the frontal horn of the left lateral ventricle. New effacement of the frontal horn of the right lateral ventricle. No extra-axial fluid collection. No midline shift. Vascular: No hyperdense vessel. Skull: Bifrontal cranioplasty with overlying scalp staples. Sinuses/Orbits: No mass or acute finding within the imaged orbits. Minimal mucosal thickening versus small mucous retention cyst within the left maxillary sinus. Near complete opacification of the left frontal sinus. IMPRESSION: 1. Interval evolution of postoperative changes from recent prior frontal mass resection. 2. An epidural collection deep to the bifrontal cranioplasty has increased in size since the brain MRI of 05/09/2023, now measuring up to 2.8 cm in thickness (previously 2.3 cm). Mass effect on the underlying frontal lobes (left greater than right). Underlying parenchymal edema within the anterior left frontal lobe is similar to the prior MRI. Underlying parenchymal edema within the anterior right frontal lobe is new. Persistent partial effacement of the frontal horn of the left lateral ventricle. Partial effacement of the frontal horn of the right lateral ventricle, new. 3. There are foci of parenchymal hemorrhage (measuring up to 10 mm) within the anterior frontal lobes, bilaterally. These foci were not definitively present on the prior MRI. 4. Complete opacification of the left frontal sinus, similar to the prior exam. Electronically Signed: By: Jackey Loge D.O. On: 05/21/2023 18:14    Procedures Procedures    Medications Ordered in  ED Medications  sodium chloride flush (NS) 0.9 % injection 3 mL (has no administration in time range)  sodium chloride flush (NS) 0.9 % injection 3 mL (has no administration in time range)  0.9 %  sodium chloride infusion (has no administration in time range)  acetaminophen (TYLENOL) tablet 650 mg (has no administration in time range)    Or  acetaminophen (TYLENOL) suppository 650 mg (has no administration in time  range)  HYDROcodone-acetaminophen (NORCO/VICODIN) 5-325 MG per tablet 1-2 tablet (2 tablets Oral Given 05/21/23 2100)  ondansetron (ZOFRAN) tablet 4 mg (has no administration in time range)    Or  ondansetron (ZOFRAN) injection 4 mg (has no administration in time range)  docusate sodium (COLACE) capsule 100 mg (has no administration in time range)  dexamethasone (DECADRON) tablet 2 mg (2 mg Oral Given 05/21/23 2100)  insulin aspart (novoLOG) injection 0-20 Units (has no administration in time range)  aspirin EC tablet 81 mg (has no administration in time range)  atorvastatin (LIPITOR) tablet 10 mg (10 mg Oral Given 05/21/23 2100)  metoprolol tartrate (LOPRESSOR) tablet 25 mg (has no administration in time range)  nitroGLYCERIN (NITROSTAT) SL tablet 0.4 mg (has no administration in time range)  memantine (NAMENDA) tablet 5 mg (has no administration in time range)  glipiZIDE (GLUCOTROL XL) 24 hr tablet 20 mg (has no administration in time range)  insulin glargine-yfgn (SEMGLEE) injection 60 Units (has no administration in time range)  levothyroxine (SYNTHROID) tablet 75 mcg (has no administration in time range)  metFORMIN (GLUCOPHAGE) tablet 1,000 mg (has no administration in time range)  pantoprazole (PROTONIX) EC tablet 40 mg (40 mg Oral Given 05/21/23 2059)  fesoterodine (TOVIAZ) tablet 4 mg (has no administration in time range)  cyanocobalamin (VITAMIN B12) tablet 1,000 mcg (has no administration in time range)  levETIRAcetam (KEPPRA) tablet 500 mg (500 mg Oral Given 05/21/23 2100)   cholecalciferol (VITAMIN D3) 25 MCG (1000 UNIT) tablet 2,000 Units (has no administration in time range)    ED Course/ Medical Decision Making/ A&P                             Medical Decision Making Amount and/or Complexity of Data Reviewed Labs: ordered.    Details: Mild leukocytosis. Radiology: ordered.    Details: Frontal edema, small hemorrage  Risk Decision regarding hospitalization.   Patient with worsening behavioral changes. Seems to have been going on post op, slowly worsening. No signs of acute infection. No fevers, etc. does have some edema, discussed with neurosurgery, Dr. Jordan Likes, who feels these are mostly stable and that his symptoms are just postop changes.  However since family having a hard time taking care of him, he will admit and patient will likely need rehabilitation.  Otherwise, no indication for antibiotics.  Will admit.          Final Clinical Impression(s) / ED Diagnoses Final diagnoses:  Meningioma [facility])    Rx / DC Orders ED Discharge Orders     None         Pricilla Loveless, MD 05/21/23 2152

## 2023-05-21 NOTE — ED Triage Notes (Signed)
Pt had crainiotomy with tumor resection by Dr. Jake Samples on 7/2. Pt's wife reports confusion since the surgery, but confusion is now worsening and he has new urinary incontinence. Family called his office and was advised to bring him to the ED.

## 2023-05-22 ENCOUNTER — Encounter (HOSPITAL_COMMUNITY): Payer: Self-pay | Admitting: Neurosurgery

## 2023-05-22 DIAGNOSIS — Z86011 Personal history of benign neoplasm of the brain: Secondary | ICD-10-CM | POA: Diagnosis not present

## 2023-05-22 DIAGNOSIS — R41844 Frontal lobe and executive function deficit: Secondary | ICD-10-CM | POA: Diagnosis not present

## 2023-05-22 DIAGNOSIS — F03911 Unspecified dementia, unspecified severity, with agitation: Principal | ICD-10-CM

## 2023-05-22 LAB — GLUCOSE, CAPILLARY
Glucose-Capillary: 257 mg/dL — ABNORMAL HIGH (ref 70–99)
Glucose-Capillary: 184 mg/dL — ABNORMAL HIGH (ref 70–99)
Glucose-Capillary: 196 mg/dL — ABNORMAL HIGH (ref 70–99)
Glucose-Capillary: 173 mg/dL — ABNORMAL HIGH (ref 70–99)

## 2023-05-22 MED ORDER — INSULIN GLARGINE-YFGN 100 UNIT/ML ~~LOC~~ SOLN
60.0000 [IU] | Freq: Every day | SUBCUTANEOUS | Status: DC
  Administered 2023-05-22 – 2023-05-25 (×4): 60 [IU] via SUBCUTANEOUS
  Filled 2023-05-22 (×4): qty 0.6

## 2023-05-22 NOTE — Progress Notes (Addendum)
IV removed by patient. IV team does not want to place another IV because the patient is not receiving anything IV.   Dr. Jordan Likes made aware via secure chat.

## 2023-05-22 NOTE — Progress Notes (Signed)
Inpatient Rehab Coordinator Note:  I met with patient and spouse at bedside to discuss CIR recommendations and goals/expectations of CIR stay.  We reviewed 3 hrs/day of therapy, physician follow up, and average length of stay 2 weeks (dependent upon progress) with goals of supervision for mobility, and up to min assist with cognition.  [name] (pt's spouse) is home and available to provide assist.  We discussed need for prior auth with [facility] and I will start that process.  We will follow for potential rehab admit pending their response and bed availability.   Estill Dooms, PT, DPT Admissions Coordinator [phone] 05/22/23  4:02 PM
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
## 2023-05-22 NOTE — Evaluation (Signed)
Occupational Therapy Evaluation Patient Details Name: [name] [name] MRN: [id number] DOB: 12-24-1945 Today's Date: 05/22/2023   History of Present Illness Pt is 77 year old presented to [facility] on  05/21/23 for AMS and worsening behavior. 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 Possibly frontal lobe edema and pt started on steroids.  PMH - CAD, colon cancer, DM, HTN, MI, PVC, ACDF, CABG x 3.   Clinical Impression   PTA, pt lives with spouse, typically ambulatory without AD and able to manage ADLs. However, family providing increased assist after recent sx and DC home due to progressive cognitive changes. Pt requires min guard for mobility when using RW. Pt requires Min A for UB ADL and up to Mod A for LB ADLs due to frequent cueing needed for sequencing, attention to task and overall safety. If family unable to provide the current assistance needed, pt may benefit from intensive rehab services prior to DC home.       Recommendations for follow up therapy are one component of a multi-disciplinary discharge planning process, led by the attending physician.  Recommendations may be updated based on patient status, additional functional criteria and insurance authorization.   Assistance Recommended at Discharge    Patient can return home with the following Assist for transportation;Direct supervision/assist for medications management;Direct supervision/assist for financial management;Assistance with cooking/housework;A little help with bathing/dressing/bathroom    Functional Status Assessment  Patient has had a recent decline in their functional status and demonstrates the ability to make significant improvements in function in a reasonable and predictable amount of time.  Equipment Recommendations  None recommended by OT    Recommendations for Other Services Rehab consult     Precautions / Restrictions Precautions Precautions: Fall;Other (comment) Precaution Comments: recent issues  with incontinence Restrictions Weight Bearing Restrictions: No      Mobility Bed Mobility               General bed mobility comments: in chair on entry    Transfers Overall transfer level: Needs assistance Equipment used: Rolling walker (2 wheels) Transfers: Sit to/from Stand Sit to Stand: Min guard           General transfer comment: min guard with use of RW from recliner      Balance Overall balance assessment: Needs assistance Sitting-balance support: No upper extremity supported, Feet supported Sitting balance-Leahy Scale: Good     Standing balance support: No upper extremity supported, During functional activity, Single extremity supported Standing balance-Leahy Scale: Fair Standing balance comment: able to stand at sink with min guard                           ADL either performed or assessed with clinical judgement   ADL Overall ADL's : Needs assistance/impaired Eating/Feeding: Supervision/ safety;Sitting   Grooming: Min guard;Standing;Minimal assistance;Wash/dry face;Moderate assistance Grooming Details (indicate cue type and reason): min guard for balance but variable cognitive assist/cues for attention to tasks, completion of tasks. pt unscrewing deodorant cap, attempting to go under gait belt to underarm. when OT moved gait belt, pt had reported no need for deodorant and began wiping sink with washcloth Upper Body Bathing: Minimal assistance;Sitting   Lower Body Bathing: Moderate assistance;Sitting/lateral leans;Sit to/from stand   Upper Body Dressing : Minimal assistance   Lower Body Dressing: Moderate assistance   Toilet Transfer: Min guard;Ambulation;Rolling walker (2 wheels)   Toileting- Clothing Manipulation and Hygiene: Moderate assistance;Sit to/from stand;Sitting/lateral lean  Functional mobility during ADLs: Min guard;Rolling walker (2 wheels);Cueing for safety;Cueing for sequencing General ADL Comments: Limited  primarily by cognition requiring frequent cueing for attention, safety, sequencing     Vision Ability to See in Adequate Light: 0 Adequate Patient Visual Report: No change from baseline Vision Assessment?: No apparent visual deficits     Perception     Praxis      Pertinent Vitals/Pain Pain Assessment Pain Assessment: No/denies pain     Hand Dominance Left   Extremity/Trunk Assessment Upper Extremity Assessment Upper Extremity Assessment: Generalized weakness   Lower Extremity Assessment Lower Extremity Assessment: Defer to PT evaluation   Cervical / Trunk Assessment Cervical / Trunk Assessment: Normal   Communication Communication Communication: No difficulties   Cognition Arousal/Alertness: Awake/alert Behavior During Therapy: Flat affect, Impulsive Overall Cognitive Status: Impaired/Different from baseline Area of Impairment: Attention, Memory, Following commands, Safety/judgement, Awareness, Problem solving, Orientation                 Orientation Level: Time, Situation Current Attention Level: Selective Memory: Decreased short-term memory Following Commands: Follows one step commands with increased time Safety/Judgement: Decreased awareness of safety, Decreased awareness of deficits Awareness: Intellectual Problem Solving: Slow processing, Decreased initiation, Difficulty sequencing, Requires verbal cues, Requires tactile cues General Comments: Pt with poor initiation at times but also impulsively moving at other times. poor attention and often sidetracked, not completing initial tasks before moving on to other tasks. requested denture case and water to fill it - pt placing tablets in denture case but then did not/would not place dentures in container. reports August 2020     General Comments  VSS    Exercises     Shoulder Instructions      Home Living Family/patient expects to be discharged to:: Private residence Living Arrangements:  Spouse/significant other Available Help at Discharge: Family Type of Home: House Home Access: Stairs to enter Secretary/administrator of Steps: 3 Entrance Stairs-Rails: Left Home Layout: One level     Bathroom Shower/Tub: Producer, television/film/video: Handicapped height Bathroom Accessibility: Yes   Home Equipment: Agricultural consultant (2 wheels);Shower seat - built in;Cane - single point;Grab bars - tub/shower;BSC/3in1          Prior Functioning/Environment Prior Level of Function : Needs assist;Patient poor historian/Family not available  Cognitive Assist : Mobility (cognitive);ADLs (cognitive) Mobility (Cognitive): Intermittent cues   Physical Assist : Mobility (physical);ADLs (physical) Mobility (physical): Transfers;Gait   Mobility Comments: During recent admission pt requiring min guard to min assist for ambulation without assistive device. prior to surgery, no AD needed and pt moving fairly well ADLs Comments: Pt reports independence with ADLs though per chart, recent difficulty since DC s/p sx. Per prior admission, spouse assisting with IADLs though pt reports able to cook at home usually (?). pt also reports going hunting        OT Problem List: Decreased activity tolerance;Decreased cognition;Decreased coordination;Impaired balance (sitting and/or standing);Decreased safety awareness;Decreased knowledge of use of DME or AE      OT Treatment/Interventions: Self-care/ADL training;Therapeutic exercise;Energy conservation;DME and/or AE instruction;Therapeutic activities;Patient/family education    OT Goals(Current goals can be found in the care plan section) Acute Rehab OT Goals Patient Stated Goal: none stated OT Goal Formulation: With patient Time For Goal Achievement: 06/05/23 Potential to Achieve Goals: Good  OT Frequency: Min 2X/week    Co-evaluation              AM-PAC OT "6 Clicks" Daily Activity     Outcome  Measure Help from another person eating  meals?: A Little Help from another person taking care of personal grooming?: A Little Help from another person toileting, which includes using toliet, bedpan, or urinal?: A Lot Help from another person bathing (including washing, rinsing, drying)?: A Lot Help from another person to put on and taking off regular upper body clothing?: A Little Help from another person to put on and taking off regular lower body clothing?: A Lot 6 Click Score: 15   End of Session Equipment Utilized During Treatment: Rolling walker (2 wheels);Gait belt  Activity Tolerance: Patient tolerated treatment well Patient left: in chair;with call bell/phone within reach;with chair alarm set  OT Visit Diagnosis: Other abnormalities of gait and mobility (R26.89);Other symptoms and signs involving cognitive function;Muscle weakness (generalized) (M62.81)                Time: 1111-1130 OT Time Calculation (min): 19 min Charges:  OT General Charges $OT Visit: 1 Visit OT Evaluation $OT Eval Moderate Complexity: 1 Mod  Bradd Canary, OTR/L Acute Rehab Services Office: [phone]   Lorre Munroe 05/22/2023, 11:43 AM

## 2023-05-22 NOTE — Progress Notes (Signed)
No new issues or problems overnight.  Patient looks better today.  A little bit more brighter and more animated and is speaking.  Denies headache or other new symptoms.  Afebrile.  Vital signs are stable.  Awake and alert.  Oriented and more appropriate.  Motor and sensory function intact.  Wound clean and dry.  Patient looks better after increasing steroid regimen.  Will need slower steroid wean.  Patient still with neurocognitive issues that prevents excessive follow-up with at present.  I will consult inpatient rehabilitation with regard to possible inpatient rehabilitation stay prior to discharge home.

## 2023-05-22 NOTE — Inpatient Diabetes Management (Signed)
Inpatient Diabetes Program Recommendations  AACE/ADA: New Consensus Statement on Inpatient Glycemic Control (2015)  Target Ranges:  Prepandial:   less than 140 mg/dL      Peak postprandial:   less than 180 mg/dL (1-2 hours)      Critically ill patients:  140 - 180 mg/dL   Lab Results  Component Value Date   GLUCAP 257 (H) 05/22/2023   HGBA1C 10.2 (H) 05/02/2023    Review of Glycemic Control  Latest Reference Range & Units 05/21/23 21:41 05/22/23 06:06  Glucose-Capillary 70 - 99 mg/dL 657 (H) 846 (H)   Diabetes history: DM 2 Outpatient Diabetes medications:  Lantus 60 units daily Metformin 1000 mg bid Current orders for Inpatient glycemic control:  Novolog 0-20 units tid with meals Decadron 2 mg q 6 hours Semglee 60 units daily Metformin 1000 mg bid  Inpatient Diabetes Program Recommendations:    Will follow.  Note home DM medications ordered.   Thanks,  Beryl Meager, RN, BC-ADM Inpatient Diabetes Coordinator Pager [phone] (8a-5p)

## 2023-05-22 NOTE — Plan of Care (Signed)

## 2023-05-22 NOTE — Evaluation (Signed)
Physical Therapy Evaluation Patient Details Name: [name] [name] MRN: [id number] DOB: 1946/06/06 Today's Date: 05/22/2023  History of Present Illness  Pt is 77 year old presented to [facility] on  05/21/23 for AMS and worsening behavior. 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 Possibly frontal lobe edema and pt started on steroids.  PMH - CAD, colon cancer, DM, HTN, MI, PVC, ACDF, CABG x 3.  Clinical Impression  Pt presents to PT with decr mobility and decr cognition. Pt needed frequent cuing for safety with mobility. Pt with decr initiation at times and impulsive at others. Feel patient will benefit from intensive inpatient follow up therapy, >3 hours/day if family feels they can provide supervision cognitive assist after a short rehab stay. If family doesn't feel this is possible feel he will need a more extended rehab.          Assistance Recommended at Discharge Frequent or constant Supervision/Assistance  If plan is discharge home, recommend the following:  Can travel by private vehicle  A little help with walking and/or transfers;A little help with bathing/dressing/bathroom;Assistance with cooking/housework;Direct supervision/assist for medications management;Direct supervision/assist for financial management;Assist for transportation;Help with stairs or ramp for entrance   Yes    Equipment Recommendations Other (comment) (To be determined at next venue)  Recommendations for Other Services       Functional Status Assessment Patient has had a recent decline in their functional status and demonstrates the ability to make significant improvements in function in a reasonable and predictable amount of time.     Precautions / Restrictions Precautions Precautions: Fall      Mobility  Bed Mobility Overal bed mobility: Needs Assistance Bed Mobility: Supine to Sit     Supine to sit: Min assist, HOB elevated     General bed mobility comments: Assist to initiate movement and  to elevate trunk into sitting    Transfers Overall transfer level: Needs assistance Equipment used: 1 person hand held assist Transfers: Sit to/from Stand Sit to Stand: Min assist           General transfer comment: Assist for balance    Ambulation/Gait Ambulation/Gait assistance: Min assist Gait Distance (Feet): 160 Feet Assistive device: 1 person hand held assist, Rolling walker (2 wheels) Gait Pattern/deviations: Step-through pattern, Decreased stride length Gait velocity: decr Gait velocity interpretation: 1.31 - 2.62 ft/sec, indicative of limited community ambulator   General Gait Details: Assist for balance throughout due to unsteady gait. Walker improved stability somewhat.  Stairs            Wheelchair Mobility     Tilt Bed    Modified Rankin (Stroke Patients Only)       Balance Overall balance assessment: Needs assistance Sitting-balance support: No upper extremity supported, Feet supported Sitting balance-Leahy Scale: Good     Standing balance support: No upper extremity supported, During functional activity, Single extremity supported Standing balance-Leahy Scale: Poor Standing balance comment: Standing by bed pt with loss of balance requiring min assist to prevent fall                             Pertinent Vitals/Pain Pain Assessment Pain Assessment: No/denies pain    Home Living Family/patient expects to be discharged to:: Private residence Living Arrangements: Spouse/significant other Available Help at Discharge: Family Type of Home: House Home Access: Stairs to enter Entrance Stairs-Rails: Left Entrance Stairs-Number of Steps: 3   Home Layout: One level Home Equipment: Rolling  Walker (2 wheels);Shower seat - built in;Cane - single point;Grab bars - tub/shower;BSC/3in1      Prior Function Prior Level of Function : Needs assist  Cognitive Assist : Mobility (cognitive);ADLs (cognitive) Mobility (Cognitive): Intermittent  cues   Physical Assist : Mobility (physical);ADLs (physical) Mobility (physical): Transfers;Gait   Mobility Comments: During recent admission pt requiring min guard to min assist for ambulation without assistive device       Hand Dominance   Dominant Hand: Left    Extremity/Trunk Assessment   Upper Extremity Assessment Upper Extremity Assessment: Defer to OT evaluation    Lower Extremity Assessment Lower Extremity Assessment: Generalized weakness       Communication   Communication: No difficulties  Cognition Arousal/Alertness: Awake/alert Behavior During Therapy: Flat affect, Impulsive Overall Cognitive Status: Impaired/Different from baseline Area of Impairment: Attention, Memory, Following commands, Safety/judgement, Awareness, Problem solving, Orientation                 Orientation Level: Time, Situation Current Attention Level: Selective Memory: Decreased short-term memory Following Commands: Follows one step commands with increased time Safety/Judgement: Decreased awareness of safety, Decreased awareness of deficits Awareness: Intellectual Problem Solving: Slow processing, Decreased initiation, Difficulty sequencing, Requires verbal cues, Requires tactile cues General Comments: Pt with poor initiation at times but also impulsively moving at other times. On commode pt repeatedly folded toilet tissue and never initiated wiping despite multiple verbal/tactile cues. Eventually had to help  pt initiate standing and for me to perform pericare. Multiple verbal/tactile cues for unsafe movements using walker in hallway.        General Comments General comments (skin integrity, edema, etc.): VSS    Exercises     Assessment/Plan    PT Assessment Patient needs continued PT services  PT Problem List Decreased strength;Decreased balance;Decreased mobility;Decreased cognition;Decreased safety awareness;Decreased knowledge of precautions       PT Treatment  Interventions DME instruction;Gait training;Stair training;Functional mobility training;Therapeutic activities;Therapeutic exercise;Balance training;Cognitive remediation;Patient/family education    PT Goals (Current goals can be found in the Care Plan section)  Acute Rehab PT Goals Patient Stated Goal: not stated PT Goal Formulation: Patient unable to participate in goal setting Time For Goal Achievement: 06/05/23 Potential to Achieve Goals: Good    Frequency Min 3X/week     Co-evaluation               AM-PAC PT "6 Clicks" Mobility  Outcome Measure Help needed turning from your back to your side while in a flat bed without using bedrails?: A Little Help needed moving from lying on your back to sitting on the side of a flat bed without using bedrails?: A Little Help needed moving to and from a bed to a chair (including a wheelchair)?: A Little Help needed standing up from a chair using your arms (e.g., wheelchair or bedside chair)?: A Little Help needed to walk in hospital room?: A Little Help needed climbing 3-5 steps with a railing? : A Lot 6 Click Score: 17    End of Session Equipment Utilized During Treatment: Gait belt Activity Tolerance: Patient tolerated treatment well Patient left: in chair;with call bell/phone within reach;with chair alarm set Nurse Communication: Mobility status PT Visit Diagnosis: Unsteadiness on feet (R26.81);Other symptoms and signs involving the nervous system (R29.898);Muscle weakness (generalized) (M62.81)    Time: 8657-8469 PT Time Calculation (min) (ACUTE ONLY): 31 min   Charges:   PT Evaluation $PT Eval Moderate Complexity: 1 Mod PT Treatments $Gait Training: 8-22 mins PT General Charges $$ ACUTE  PT VISIT: 1 Visit         [facility] PT Acute Rehabilitation Services Office [phone]   Angelina Ok [facility] 05/22/2023, 11:30 AM

## 2023-05-23 LAB — GLUCOSE, CAPILLARY
Glucose-Capillary: 144 mg/dL — ABNORMAL HIGH (ref 70–99)
Glucose-Capillary: 142 mg/dL — ABNORMAL HIGH (ref 70–99)
Glucose-Capillary: 126 mg/dL — ABNORMAL HIGH (ref 70–99)
Glucose-Capillary: 165 mg/dL — ABNORMAL HIGH (ref 70–99)
Glucose-Capillary: 172 mg/dL — ABNORMAL HIGH (ref 70–99)

## 2023-05-23 MED ORDER — DEXAMETHASONE 2 MG PO TABS
2.0000 mg | ORAL_TABLET | Freq: Two times a day (BID) | ORAL | Status: DC
  Administered 2023-05-23 – 2023-05-25 (×4): 2 mg via ORAL
  Filled 2023-05-23 (×4): qty 1

## 2023-05-23 NOTE — TOC CM/SW Note (Signed)
Transition of Care [facility]) - Inpatient Brief Assessment   Patient Details  Name: [name] [name] MRN: [id number] Date of Birth: December 25, 1945  Transition of [facility]) CM/SW Contact:    Kermit Balo, RN Phone Number: 05/23/2023, 3:03 PM   Clinical Narrative: Insurance pending for [employer].   Transition of Care Asessment: Insurance and Status: Insurance coverage has been reviewed Patient has primary care physician: Yes Home environment has been reviewed: home with spouse   Prior/Current Home Services: No current home services Social Determinants of Health Reivew: SDOH reviewed no interventions necessary Readmission risk has been reviewed: Yes Transition of care needs: transition of care needs identified, TOC will continue to follow

## 2023-05-23 NOTE — Plan of Care (Signed)
  Problem: Coping: Goal: Ability to adjust to condition or change in health will improve Outcome: Progressing   Problem: Fluid Volume: Goal: Ability to maintain a balanced intake and output will improve Outcome: Progressing   Problem: Metabolic: Goal: Ability to maintain appropriate glucose levels will improve Outcome: Progressing   Problem: Nutritional: Goal: Maintenance of adequate nutrition will improve Outcome: Progressing   Problem: Skin Integrity: Goal: Risk for impaired skin integrity will decrease Outcome: Progressing   Problem: Clinical Measurements: Goal: Will remain free from infection Outcome: Progressing   Problem: Activity: Goal: Risk for activity intolerance will decrease Outcome: Progressing   Problem: Nutrition: Goal: Adequate nutrition will be maintained Outcome: Progressing   Problem: Coping: Goal: Level of anxiety will decrease Outcome: Progressing   Problem: Elimination: Goal: Will not experience complications related to bowel motility Outcome: Progressing Goal: Will not experience complications related to urinary retention Outcome: Progressing

## 2023-05-23 NOTE — PMR Pre-admission (Signed)
PMR Admission Coordinator Pre-Admission Assessment  Patient: [name] [name] is an 77 y.o., male MRN: [id number] DOB: 1946/04/04 Height: 5\' 6"  (167.6 cm) Weight: 82.9 kg (bed weight)              Insurance Information HMO:     PPO: yes     PCP:      IPA:      80/20:      OTHER:  PRIMARY: BCBS Medicare      Policy#: [id number]      Subscriber: pt CM Name: Donnella Sham      Phone#: [phone]     Fax#: [phone] Pre-Cert#: [id number] on admit.  Approved via fast appeal for admit 7/18 with updates due to fax listed above on 7/25     Employer:  Benefits:  Phone #: [phone]     Name:  Eff. Date: 11/06/22     Deduct: $0      Out of Pocket Max: $3150 (met $1128.28)      Life Max: n/a  CIR: $335/day for days 1-5      SNF: 20 full days Outpatient:      Co-Pay: $10/visit Home Health: 100%      Co-Pay:  DME: 80%     Co-Pay: 20% Providers:  SECONDARY:       Policy#:       Phone#:   Artist:       Phone#:   The Engineer, materials Information Summary" for patients in Inpatient Rehabilitation Facilities with attached "Privacy Act Statement-Health Care Records" was provided and verbally reviewed with: Patient and Family  Emergency Contact Information Contact Information   None on File    Other Contacts     Name Relation Home Work Mobile   [place] Spouse [phone]  [phone]   Pattricia Boss   [phone]      Current Medical History  Patient Admitting Diagnosis: meningioma s/p bifrontal crani for resection   History of Present Illness: Pt is a 77 y/o male with PMH of CAD, colon cancer, DM, HTN, MI, ACDF, and CABG x3 who recently was admitted for a bifrontal crani to resect tumor on 7/2 and discharged home, and re-admitted to [facility] on 05/21/23 with AMS and worsening behavior. In ED labs notable for Na 131, glucose 268, WBC 11.3, albumin 3.0, CT head reassuring.  Pt afebrile and no other signs of infection.  Neuro exam reassuring.  Neurosurgery felt likely cause was  frontal lobe edema following tumor resection and he was started on a slow steroid taper.  Therapy evaluations completed and pt was recommended for CIR.     Glasgow Coma Scale Score: 15  Patient's medical record from [facility] has been reviewed by the rehabilitation admission coordinator and physician.  Past Medical History  Past Medical History:  Diagnosis Date   Bladder infection    10th grade   CAD (coronary artery disease)    s/p 3V CABG 2001   Cervical spondylosis 2002   History of   Colon cancer (HCC)    Diabetes mellitus (HCC)    Dysphagia    GERD (gastroesophageal reflux disease)    Heart murmur    pt has had echo   HTN (hypertension)    Hyperlipidemia    Hypothyroidism    LVH (left ventricular hypertrophy) 2015   Mild   Myocardial infarction (HCC)    x2   PVC (premature ventricular contraction)    a. s/p ablation   Tobacco abuse, in remission  Has the patient had major surgery during 100 days prior to admission? Yes  Family History  family history includes Diabetes in his father and mother; Heart attack (age of onset: 29) in his father; Heart attack (age of onset: 74) in his paternal grandfather.   Current Medications   Current Facility-Administered Medications:    0.9 %  sodium chloride infusion, 250 mL, Intravenous, PRN, Julio Sicks, MD   acetaminophen (TYLENOL) tablet 650 mg, 650 mg, Oral, Q6H PRN **OR** acetaminophen (TYLENOL) suppository 650 mg, 650 mg, Rectal, Q6H PRN, Pool, Sherilyn Cooter, MD   aspirin EC tablet 81 mg, 81 mg, Oral, Daily, Pool, Sherilyn Cooter, MD, 81 mg at 05/25/23 1191   atorvastatin (LIPITOR) tablet 10 mg, 10 mg, Oral, QHS, Pool, Sherilyn Cooter, MD, 10 mg at 05/24/23 2128   cholecalciferol (VITAMIN D3) 25 MCG (1000 UNIT) tablet 2,000 Units, 2,000 Units, Oral, Daily, Julio Sicks, MD, 2,000 Units at 05/25/23 4782   cyanocobalamin (VITAMIN B12) tablet 1,000 mcg, 1,000 mcg, Oral, Daily, Pool, Sherilyn Cooter, MD, 1,000 mcg at 05/25/23 0926   dexamethasone (DECADRON)  tablet 2 mg, 2 mg, Oral, Q12H, Pool, Sherilyn Cooter, MD, 2 mg at 05/25/23 9562   docusate sodium (COLACE) capsule 100 mg, 100 mg, Oral, BID, Pool, Sherilyn Cooter, MD, 100 mg at 05/25/23 0940   feeding supplement (ENSURE ENLIVE / ENSURE PLUS) liquid 237 mL, 237 mL, Oral, BID BM, Julio Sicks, MD, 237 mL at 05/25/23 0942   fesoterodine (TOVIAZ) tablet 4 mg, 4 mg, Oral, Daily, Pool, Sherilyn Cooter, MD, 4 mg at 05/25/23 1308   HYDROcodone-acetaminophen (NORCO/VICODIN) 5-325 MG per tablet 1-2 tablet, 1-2 tablet, Oral, Q4H PRN, Julio Sicks, MD, 2 tablet at 05/21/23 2100   insulin aspart (novoLOG) injection 0-20 Units, 0-20 Units, Subcutaneous, TID WC, Pool, Sherilyn Cooter, MD, 7 Units at 05/25/23 1300   insulin glargine-yfgn (SEMGLEE) injection 60 Units, 60 Units, Subcutaneous, Daily, Reome, Earle J, RPH, 60 Units at 05/25/23 0936   levETIRAcetam (KEPPRA) tablet 500 mg, 500 mg, Oral, Q12H, Pool, Sherilyn Cooter, MD, 500 mg at 05/25/23 6578   levothyroxine (SYNTHROID) tablet 75 mcg, 75 mcg, Oral, q morning, Pool, Sherilyn Cooter, MD, 75 mcg at 05/25/23 0635   memantine (NAMENDA) tablet 5 mg, 5 mg, Oral, BID, Pool, Sherilyn Cooter, MD, 5 mg at 05/25/23 4696   metFORMIN (GLUCOPHAGE) tablet 1,000 mg, 1,000 mg, Oral, BID WC, Pool, Sherilyn Cooter, MD, 1,000 mg at 05/25/23 0932   metoprolol tartrate (LOPRESSOR) tablet 25 mg, 25 mg, Oral, Q0600, Julio Sicks, MD, 25 mg at 05/25/23 2952   multivitamin with minerals tablet 1 tablet, 1 tablet, Oral, Daily, Julio Sicks, MD, 1 tablet at 05/25/23 0927   nitroGLYCERIN (NITROSTAT) SL tablet 0.4 mg, 0.4 mg, Sublingual, Q5 min PRN, Julio Sicks, MD   ondansetron (ZOFRAN) tablet 4 mg, 4 mg, Oral, Q6H PRN **OR** ondansetron (ZOFRAN) injection 4 mg, 4 mg, Intravenous, Q6H PRN, Pool, Sherilyn Cooter, MD   pantoprazole (PROTONIX) EC tablet 40 mg, 40 mg, Oral, Daily, Pool, Sherilyn Cooter, MD, 40 mg at 05/25/23 8413   sodium chloride flush (NS) 0.9 % injection 3 mL, 3 mL, Intravenous, Q12H, Pool, Sherilyn Cooter, MD, 3 mL at 05/22/23 0819   sodium chloride flush (NS) 0.9 % injection 3  mL, 3 mL, Intravenous, PRN, Julio Sicks, MD  Patients Current Diet:  Diet Order             Diet Carb Modified Fluid consistency: Thin; Room service appropriate? Yes with Assist  Diet effective now  Precautions / Restrictions Precautions Precautions: Fall, Other (comment) Precaution Comments: recent issues with incontinence Restrictions Weight Bearing Restrictions: No   Has the patient had 2 or more falls or a fall with injury in the past year?Yes  Prior Activity Level Limited Community (1-2x/wk): cognitive decline recently, mod I with mobility and occasional assist with ADLs, full assist for iADLs  Prior Functional Level Prior Function Prior Level of Function : Needs assist, Patient poor historian/Family not available  Cognitive Assist : Mobility (cognitive), ADLs (cognitive) Mobility (Cognitive): Intermittent cues Physical Assist : Mobility (physical), ADLs (physical) Mobility (physical): Transfers, Gait Mobility Comments: During recent admission pt requiring min guard to min assist for ambulation without assistive device. prior to surgery, no AD needed and pt moving fairly well ADLs Comments: Pt reports independence with ADLs though per chart, recent difficulty since DC s/p sx. Per prior admission, spouse assisting with IADLs though pt reports able to cook at home usually (?). pt also reports going hunting  Self Care: Did the patient need help bathing, dressing, using the toilet or eating?  Needed some help  Indoor Mobility: Did the patient need assistance with walking from room to room (with or without device)? Independent  Stairs: Did the patient need assistance with internal or external stairs (with or without device)? Needed some help  Functional Cognition: Did the patient need help planning regular tasks such as shopping or remembering to take medications? Dependent  Patient Information Are you of Hispanic, Latino/a,or Spanish origin?: A. No,  not of Hispanic, Latino/a, or Spanish origin, X. Patient unable to respond (proxy) What is your race?: A. White, X. Patient unable to respond (proxy) Do you need or want an interpreter to communicate with a doctor or health care staff?: 9. Unable to respond  Patient's Response To:  Health Literacy and Transportation Is the patient able to respond to health literacy and transportation needs?: No  Home Assistive Devices / Equipment Home Assistive Devices/Equipment: Eyeglasses, Dentures (specify type) Home Equipment: Agricultural consultant (2 wheels), Shower seat - built in, Parker - single point, Grab bars - tub/shower, BSC/3in1  Prior Device Use: Indicate devices/aids used by the patient prior to current illness, exacerbation or injury? None of the above  Current Functional Level Cognition  Overall Cognitive Status: Impaired/Different from baseline Current Attention Level: Selective, Sustained Orientation Level: Oriented X4 Following Commands: Follows one step commands inconsistently, Follows one step commands with increased time Safety/Judgement: Decreased awareness of deficits General Comments: pt laughing appropriately to humor though intermittent mild agitated remarks. attention [facility] for looking through menu to call, attempt to order muffin though memory deficist noted when pt later asked what was determined about the muffin though OT had educated that the computers were down. Pt also with decreased memory fo situation reporting he had been here 15 days and did not go home right after surgery. very poor initiation of attempts for OOB tasks despite OT's various approaches with even wife entering to prompt    Extremity Assessment (includes Sensation/Coordination)  Upper Extremity Assessment: Generalized weakness  Lower Extremity Assessment: Defer to PT evaluation    ADLs  Overall ADL's : Needs assistance/impaired Eating/Feeding: Modified independent, Sitting Eating/Feeding Details (indicate cue  type and reason): placing ice chips in diet coke on entry Grooming: Min guard, Standing, Minimal assistance, Wash/dry face, Moderate assistance Grooming Details (indicate cue type and reason): min guard for balance but variable cognitive assist/cues for attention to tasks, completion of tasks. pt unscrewing deodorant cap, attempting to go under gait belt to  underarm. when OT moved gait belt, pt had reported no need for deodorant and began wiping sink with washcloth Upper Body Bathing: Minimal assistance, Sitting Lower Body Bathing: Moderate assistance, Sitting/lateral leans, Sit to/from stand Upper Body Dressing : Minimal assistance Lower Body Dressing: Moderate assistance Lower Body Dressing Details (indicate cue type and reason): Provided fresh grip socks though pt appeared to be distracted (likely voluntarily distracted) and ultimately asking wife to assist who then donned pt's socks. Toilet Transfer: Min guard, Ambulation, Rolling walker (2 wheels) Toileting- Clothing Manipulation and Hygiene: Moderate assistance, Sit to/from stand, Sitting/lateral lean Functional mobility during ADLs: Min guard, Rolling walker (2 wheels), Cueing for safety, Cueing for sequencing General ADL Comments: Pt focus on attention with menu provided to problem solve/locate food items and determine how to order food w/ assist - fair problem solving. when attempting prompts for EOB, pt w/poor initiation, attention and questionably self limiting. Pt also reporting need to go to bathroom to urinate (OT encouraged this despite condom cath d/t recent issues w/ incontinence in an effort to retrain bladder). However, unable to prompt pt to EOB/OOB    Mobility  Overal bed mobility: Needs Assistance Bed Mobility: Supine to Sit Supine to sit: Min assist, HOB elevated General bed mobility comments: did not respond to EOB prompts    Transfers  Overall transfer level: Needs assistance Equipment used: Rolling walker (2  wheels) Transfers: Sit to/from Stand Sit to Stand: Min guard General transfer comment: did not respond to OOB prompts    Ambulation / Gait / Stairs / Wheelchair Mobility  Ambulation/Gait Ambulation/Gait assistance: Editor, commissioning (Feet): 160 Feet Assistive device: 1 person hand held assist, Rolling walker (2 wheels) Gait Pattern/deviations: Step-through pattern, Decreased stride length General Gait Details: Assist for balance throughout due to unsteady gait. Walker improved stability somewhat. Gait velocity: decr Gait velocity interpretation: 1.31 - 2.62 ft/sec, indicative of limited community ambulator    Posture / Balance Balance Overall balance assessment: Needs assistance Sitting-balance support: No upper extremity supported, Feet supported Sitting balance-Leahy Scale: Good Standing balance support: No upper extremity supported, During functional activity, Single extremity supported Standing balance-Leahy Scale: Fair Standing balance comment: able to stand at sink with min guard    Special needs/care consideration Skin large surgical incision to forehead and Diabetic management yes     Previous Home Environment (from acute therapy documentation) Living Arrangements: Spouse/significant other Available Help at Discharge: Family Type of Home: House Home Layout: One level Home Access: Stairs to enter Entrance Stairs-Rails: Left Entrance Stairs-Number of Steps: 3 Bathroom Shower/Tub: Health visitor: Handicapped height Bathroom Accessibility: Yes Home Care Services: Yes Type of Home Care Services: Home PT  Discharge Living Setting Plans for Discharge Living Setting: Patient's home, Lives with (comment) (spouse) Type of Home at Discharge: House Discharge Home Layout: One level Discharge Home Access: Stairs to enter Entrance Stairs-Rails: Left Entrance Stairs-Number of Steps: 23 Discharge Bathroom Shower/Tub: Walk-in shower Discharge Bathroom  Toilet: Handicapped height Discharge Bathroom Accessibility: Yes How Accessible: Accessible via walker Does the patient have any problems obtaining your medications?: No  Social/Family/Support Systems Patient Roles: Spouse Anticipated Caregiver: Dorris Maton (spouse) Anticipated Caregiver's Contact Information: [phone] Ability/Limitations of Caregiver: supervision for mobility/ADLs Caregiver Availability: 24/7 Discharge Plan Discussed with Primary Caregiver: Yes Is Caregiver In Agreement with Plan?: Yes Does Caregiver/Family have Issues with Lodging/Transportation while Pt is in Rehab?: No   Goals Patient/Family Goal for Rehab: PT/OT supervision, SLP supervision-min A Expected length of stay: 6-9 days Additional Information: Discharge  plan: return home with spouse providing 24/7 supervision Pt/Family Agrees to Admission and willing to participate: Yes Program Orientation Provided & Reviewed with Pt/Caregiver Including Roles  & Responsibilities: Yes  Barriers to Discharge: Insurance for SNF coverage   Decrease burden of Care through IP rehab admission:  n/a   Possible need for SNF placement upon discharge: Not anticipated.  Plan for d/c home with spouse and 24/7 supervision from her.    Patient Condition: This patient's condition remains as documented in the consult dated 7/16, in which the Rehabilitation Physician determined and documented that the patient's condition is appropriate for intensive rehabilitative care in an inpatient rehabilitation facility. Will admit to inpatient rehab today.  Preadmission Screen Completed By:  Stephania Fragmin, PT, DPT 05/25/2023 2:28 PM ______________________________________________________________________   Discussed status with Dr. Riley Kill on 05/25/23 at 2:28 PM  and received approval for admission today.  Admission Coordinator:  Stephania Fragmin, PT, DPT time 2:28 PM Dorna Bloom 05/25/23

## 2023-05-23 NOTE — Progress Notes (Signed)
63 staples removed from head incision. Patient tolerated well.

## 2023-05-23 NOTE — Progress Notes (Signed)
Inpatient Rehab Admissions Coordinator:   Insurance auth pending.  Will follow.   Estill Dooms, PT, DPT Admissions Coordinator [phone] 05/23/23  10:18 AM

## 2023-05-23 NOTE — Progress Notes (Signed)
Mobility Specialist Progress Note   05/23/23 1651  Mobility  Activity Ambulated with assistance in hallway  Level of Assistance Minimal assist, patient does 75% or more  Assistive Device Front wheel walker  Distance Ambulated (ft) 150 ft  Activity Response Tolerated well  Mobility Referral Yes  $Mobility charge 1 Mobility  Mobility Specialist Start Time (ACUTE ONLY) 1610  Mobility Specialist Stop Time (ACUTE ONLY) 1653  Mobility Specialist Time Calculation (min) (ACUTE ONLY) 43 min   Received in bed stating no complaints and agreeable to mobility. Pt vocally responding to cues but requiring tactile cues to initiate movements or transitions. CGA used to ambulate in hallway but pt requiring multiple cues for direction. Able to return back to room w/o fault, left in chair w/ call bell in reach and chair alarm on   Frederico Hamman Mobility Specialist Please contact via SecureChat or  Rehab office at [phone]

## 2023-05-23 NOTE — Progress Notes (Signed)
No new issues or problems overnight.  Remains intermittently agitated and seems to have difficulty with focus and attention.  He is afebrile.  His vital signs are stable.  He is awake and alert.  He is oriented and reasonably appropriate.  Motor and sensory function are intact.  Wound healing well.  Status post bifrontal craniotomy and resection of large frontal meningioma.  Continue steroids and therapy efforts.  Being evaluated for possible inpatient rehab.

## 2023-05-24 LAB — GLUCOSE, CAPILLARY
Glucose-Capillary: 90 mg/dL (ref 70–99)
Glucose-Capillary: 143 mg/dL — ABNORMAL HIGH (ref 70–99)
Glucose-Capillary: 231 mg/dL — ABNORMAL HIGH (ref 70–99)
Glucose-Capillary: 121 mg/dL — ABNORMAL HIGH (ref 70–99)

## 2023-05-24 MED ORDER — ENSURE ENLIVE PO LIQD
237.0000 mL | Freq: Two times a day (BID) | ORAL | Status: DC
  Administered 2023-05-24 – 2023-05-25 (×3): 237 mL via ORAL

## 2023-05-24 MED ORDER — ADULT MULTIVITAMIN W/MINERALS CH
1.0000 | ORAL_TABLET | Freq: Every day | ORAL | Status: DC
  Administered 2023-05-24 – 2023-05-25 (×2): 1 via ORAL
  Filled 2023-05-24 (×2): qty 1

## 2023-05-24 NOTE — Plan of Care (Signed)
  Problem: Fluid Volume: Goal: Ability to maintain a balanced intake and output will improve Outcome: Progressing   Problem: Metabolic: Goal: Ability to maintain appropriate glucose levels will improve Outcome: Progressing   Problem: Nutritional: Goal: Maintenance of adequate nutrition will improve Outcome: Progressing   Problem: Skin Integrity: Goal: Risk for impaired skin integrity will decrease Outcome: Progressing   Problem: Tissue Perfusion: Goal: Adequacy of tissue perfusion will improve Outcome: Progressing   Problem: Clinical Measurements: Goal: Will remain free from infection Outcome: Progressing   Problem: Activity: Goal: Risk for activity intolerance will decrease Outcome: Progressing   Problem: Nutrition: Goal: Adequate nutrition will be maintained Outcome: Progressing   Problem: Coping: Goal: Level of anxiety will decrease Outcome: Progressing   Problem: Elimination: Goal: Will not experience complications related to bowel motility Outcome: Progressing Goal: Will not experience complications related to urinary retention Outcome: Progressing   Problem: Pain Managment: Goal: General experience of comfort will improve Outcome: Progressing

## 2023-05-24 NOTE — Progress Notes (Signed)
Inpatient Rehab Admissions Coordinator:   Notified by insurance that request for CIR has been denied.  No peer to peer option, but can complete fast/expedited appeal.  I discussed case with Dr.'s Sheffield Slider, and Lovorn, all of whom agree that patient's needs cannot be met in a skilled nursing setting.  I will work on appeal.   Estill Dooms, PT, DPT Admissions Coordinator [phone] 05/24/23  4:11 PM

## 2023-05-24 NOTE — Plan of Care (Signed)
  Problem: Education: Goal: Knowledge of the prescribed therapeutic regimen will improve Outcome: Progressing   Problem: Clinical Measurements: Goal: Usual level of consciousness will be regained or maintained. Outcome: Progressing Goal: Neurologic status will improve Outcome: Progressing Goal: Ability to maintain intracranial pressure will improve Outcome: Progressing   Problem: Skin Integrity: Goal: Demonstration of wound healing without infection will improve Outcome: Progressing   Problem: Education: Goal: Ability to describe self-care measures that may prevent or decrease complications (Diabetes Survival Skills Education) will improve Outcome: Progressing Goal: Individualized Educational Video(s) Outcome: Progressing   Problem: Coping: Goal: Ability to adjust to condition or change in health will improve Outcome: Progressing   Problem: Fluid Volume: Goal: Ability to maintain a balanced intake and output will improve Outcome: Progressing   Problem: Health Behavior/Discharge Planning: Goal: Ability to identify and utilize available resources and services will improve Outcome: Progressing Goal: Ability to manage health-related needs will improve Outcome: Progressing   Problem: Metabolic: Goal: Ability to maintain appropriate glucose levels will improve Outcome: Progressing   Problem: Nutritional: Goal: Maintenance of adequate nutrition will improve Outcome: Progressing Goal: Progress toward achieving an optimal weight will improve Outcome: Progressing   Problem: Skin Integrity: Goal: Risk for impaired skin integrity will decrease Outcome: Progressing   Problem: Tissue Perfusion: Goal: Adequacy of tissue perfusion will improve Outcome: Progressing   Problem: Education: Goal: Knowledge of General Education information will improve Description: Including pain rating scale, medication(s)/side effects and non-pharmacologic comfort measures Outcome: Progressing    Problem: Health Behavior/Discharge Planning: Goal: Ability to manage health-related needs will improve Outcome: Progressing   Problem: Clinical Measurements: Goal: Ability to maintain clinical measurements within normal limits will improve Outcome: Progressing Goal: Will remain free from infection Outcome: Progressing Goal: Diagnostic test results will improve Outcome: Progressing Goal: Respiratory complications will improve Outcome: Progressing Goal: Cardiovascular complication will be avoided Outcome: Progressing   Problem: Activity: Goal: Risk for activity intolerance will decrease Outcome: Progressing   Problem: Nutrition: Goal: Adequate nutrition will be maintained Outcome: Progressing   Problem: Coping: Goal: Level of anxiety will decrease Outcome: Progressing   Problem: Elimination: Goal: Will not experience complications related to bowel motility Outcome: Progressing Goal: Will not experience complications related to urinary retention Outcome: Progressing   Problem: Pain Managment: Goal: General experience of comfort will improve Outcome: Progressing   Problem: Safety: Goal: Ability to remain free from injury will improve Outcome: Progressing   Problem: Skin Integrity: Goal: Risk for impaired skin integrity will decrease Outcome: Progressing

## 2023-05-24 NOTE — Progress Notes (Addendum)
[name] is a 77 year old male with a history of coronary artery disease status post MI, diabetes, colon cancer, and dementia who underwent stereotactic bifrontal craniotomy for meningioma resection on 05/08/2023.  Patient was initially discharged home, however was readmitted to the neurosurgery service 05-21-2023 after developing increasing agitation, confusion, and behavioral difficulty at home.  Workup was significant for slightly increased epidural fluid collection in the bifrontal cranioplasty site with mass effect on the frontal lobes, as well as new parenchymal hemorrhage in the anterior frontal lobes.  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  This resulted in functional deficits in cognition, mobility/ambulation and ADLs requiring anywhere from min to moderate assistance.    He would benefit from an inpatient rehab stay both to work on functional deficits, and for behavioral and medication management to promote sleep-wake cycle, behavioral stability, and improved concentration/attention under the specialized care of a physiatrist.  He has been making gains in therapies and appears motivated, and would be able to tolerate 3 hours a day 5 days a week of PT/OT/SLP therapies.  Additional medical necessity would be warranted due to hyperglycemia with type 2 diabetes on steroids, hyponatremia, and bladder incontinence.  It is my medical opinion that [name] cognitive and behavioral deficits as a result of his acquired brain injury would be difficult to manage in a skilled nursing environment due to decreased staff and lack of expertise in brain injury management, and that discharge home would be unsafe without therapies and family training to  ensure safety in his home environment.  We do feel that, with appropriate inpatient rehab therapies and medical management, the patient has a good chance of discharge home.  We therefore wish to appeal the decision for denial for inpatient rehab.   Angelina Sheriff, DO 05/24/2023

## 2023-05-24 NOTE — Progress Notes (Signed)
Initial Nutrition Assessment  DOCUMENTATION CODES:   Not applicable  INTERVENTION:  Continue current diet as ordered, adjust to ordering assistance Ensure Enlive po BID, each supplement provides 350 kcal and 20 grams of protein. MVI with minerals daily  NUTRITION DIAGNOSIS:   Inadequate oral intake related to decreased appetite as evidenced by per patient/family report.  GOAL:   Patient will meet greater than or equal to 90% of their needs  MONITOR:   PO intake, Supplement acceptance  REASON FOR ASSESSMENT:   Malnutrition Screening Tool    ASSESSMENT:   Pt with hx of CAD, HTN, HLD, DM, hx MI, GERD, recently diagnosed meningioma, and hx colon cancer presented to ED with behavior changes after a recent craniotomy for tumor resection.  Pt resting in bed at the time of assessment. Awake and interactive, but only answered with a few words. Pt states he usually weighs about 199 lbs. Bedweight obtained shows modest weight loss, but much different than admit weight of 71.7kg. Mild edema noted to the lower extremities.   Pt reports fair appetite but does admit it  has decreased. Agreeable to ensure, prefers chocolate.  Pt currently appealing CIR denial from insurance.   Average Meal Intake: 7/16-7/17: 44% intake x 4 recorded meals  Nutritionally Relevant Medications: Scheduled Meds:  atorvastatin  10 mg Oral QHS   cholecalciferol  2,000 Units Oral Daily   cyanocobalamin  1,000 mcg Oral Daily   dexamethasone  2 mg Oral Q12H   docusate sodium  100 mg Oral BID   insulin aspart  0-20 Units Subcutaneous TID WC   insulin glargine-yfgn  60 Units Subcutaneous Daily   memantine  5 mg Oral BID   metFORMIN  1,000 mg Oral BID WC   pantoprazole  40 mg Oral Daily   PRN Meds: ondansetron  Labs Reviewed: Na 131, chloride 97 CBG ranges from 90-165 mg/dL over the last 24 hours  NUTRITION - FOCUSED PHYSICAL EXAM: Flowsheet Row Most Recent Value  Orbital Region No depletion  Upper  Arm Region No depletion  Thoracic and Lumbar Region No depletion  Buccal Region No depletion  Temple Region No depletion  Clavicle Bone Region Mild depletion  Clavicle and Acromion Bone Region No depletion  Scapular Bone Region No depletion  Dorsal Hand No depletion  Patellar Region Moderate depletion  Anterior Thigh Region Moderate depletion  Posterior Calf Region No depletion  Edema (RD Assessment) Mild  [ankles]  Hair Reviewed  Eyes Reviewed  Mouth Reviewed  Skin Reviewed  Nails Reviewed    Diet Order:   Diet Order             Diet Carb Modified Fluid consistency: Thin; Room service appropriate? Yes  Diet effective now                   EDUCATION NEEDS:   Education needs have been addressed  Skin:  Skin Assessment: Reviewed RN Assessment  Last BM:  7/16  Height:   Ht Readings from Last 1 Encounters:  05/21/23 5\' 6"  (1.676 m)    Weight:   Wt Readings from Last 1 Encounters:  05/24/23 82.9 kg    Ideal Body Weight:  64.5 kg  BMI:  Body mass index is 29.5 kg/m.  Estimated Nutritional Needs:   Kcal:  1700-1900 kcal/d  Protein:  85-100 g/d  Fluid:  >/=1.8L/d    Greig Castilla, RD, LDN Clinical Dietitian RD pager # available in [facility]  After hours/weekend pager # available in [facility]

## 2023-05-24 NOTE — Progress Notes (Signed)
   Providing Compassionate, Quality Care - Together   Subjective: Patient not very interactive. His wife is at the bedside.  Objective: Vital signs in last 24 hours: Temp:  [97.7 F (36.5 C)-98.9 F (37.2 C)] 97.7 F (36.5 C) (07/18 1132) Pulse Rate:  [61-66] 62 (07/18 1132) Resp:  [14-18] 14 (07/18 1132) BP: (114-142)/(64-74) 114/64 (07/18 1132) SpO2:  [94 %-99 %] 96 % (07/18 1132)  Intake/Output from previous day: 07/17 0701 - 07/18 0700 In: 640 [P.O.:640] Out: 2500 [Urine:2500] Intake/Output this shift: Total I/O In: 240 [P.O.:240] Out: -   Alert, flat affect PERRLA Oriented to person and place MAE, S/S intact Surgical wound is well-approximated  Lab Results: Recent Labs    05/21/23 1614  WBC 11.3*  HGB 13.6  HCT 40.9  PLT 185   BMET Recent Labs    05/21/23 1614  NA 131*  K 4.6  CL 97*  CO2 22  GLUCOSE 268*  BUN 19  CREATININE 1.11  CALCIUM 9.1    Studies/Results: No results found.  Assessment/Plan: Patient underwent a craniotomy for resection of a large bifrontal meningioma. He was discharge home, but demonstrated changes in mood and behavior. The patient's follow up imaging demonstrated anticipated postoperative findings, with some frontal lobe edema. The patient and his family are hopeful he can discharge to CIR.   LOS: 3 days   -Plan is for appeal with insurance, who denied the initial request for CIR. -Continue to mobilize and work with therapies.   Val Eagle, DNP, AGNP-C Nurse Practitioner  [facility] [address], [address], [place], [place] [address] P: [phone]    F: [phone]  05/24/2023, 11:52 AM

## 2023-05-24 NOTE — Care Management Important Message (Signed)
Important Message  Patient Details  Name: [name] [name] MRN: [id number] Date of Birth: [birth year]   Medicare Important Message Given:  Yes     Cheney Ewart Stefan Church 05/24/2023, 2:41 PM

## 2023-05-24 NOTE — Progress Notes (Signed)
Physical Therapy Treatment Patient Details Name: [name] [name] MRN: [id number] DOB: 03/17/1946 Today's Date: 05/24/2023   History of Present Illness Pt is 77 year old presented to [facility] on  05/21/23 for AMS and worsening behavior. 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 Possibly frontal lobe edema and pt started on steroids.  PMH - CAD, colon cancer, DM, HTN, MI, PVC, ACDF, CABG x 3.    PT Comments  Pt had very flat effect with significant delays in answering questions and complying with requests if at all. Pt was asked to sit EOB and pt started but then placed LE back in bed. Pt stated " did you see me put that leg back." He then stopped responding to requests then suddenly stated " is it your job to make people mad in the morning." Pt was listened to and educated on the importance of mobility as well as discussed a plan that worked for him today. Pt was agreeable to perform some in bed activities and was receptive to education on edema management with movement. Pt then stated he was in a bad mood today but did not know why. Nursing was notified and pt agreeable to mobilize later in the day or tomorrow when he is feeling better. No signs/symptoms of cardiac/respiratory distress throughout session.      Assistance Recommended at Discharge Frequent or constant Supervision/Assistance  If plan is discharge home, recommend the following:  Can travel by private vehicle    A little help with walking and/or transfers;Assist for transportation;Help with stairs or ramp for entrance;Assistance with cooking/housework   Yes  Equipment Recommendations  Other (comment) (defer to post acute)    Recommendations for Other Services       Precautions / Restrictions Precautions Precautions: Fall;Other (comment) Precaution Comments: recent issues with incontinence Restrictions Weight Bearing Restrictions: No     Mobility  Bed Mobility               General bed mobility comments: Pt  initially moved the RLE toward EOB then put leg back and stated " did you see that leg go back." Then did not respond to further commands.                Cognition Arousal/Alertness: Awake/alert Behavior During Therapy: Flat affect Overall Cognitive Status: Impaired/Different from baseline Area of Impairment: Following commands, Awareness                 Orientation Level: Time, Situation     Following Commands: Follows one step commands inconsistently Safety/Judgement: Decreased awareness of deficits     General Comments: Pt had very flat effect after some light in bed activities and ther-ex pt stated " Is it your job to make people mad in the morning." PT provided active listening and asked what was going on. Pt stated " I don't know I am just in a bad mood."        Exercises Other Exercises Other Exercises: Hip abd/add, ankle pumps. reaching R/L toward rail.    General Comments General comments (skin integrity, edema, etc.): Pt session was short today due to pt getting slightly agitated. Discussed with pt and gave education on importance of movement for fluid due to pt was concerned about fluid in the legs mentioning it 3x in short session. Pt stated he was in a bad mood today and did not know why. He was agreeable to try later in the day or tomorrow time permitting.      Pertinent  Vitals/Pain Pain Assessment Pain Assessment: No/denies pain     PT Goals (current goals can now be found in the care plan section) Acute Rehab PT Goals Patient Stated Goal: not stated PT Goal Formulation: Patient unable to participate in goal setting Time For Goal Achievement: 06/05/23 Potential to Achieve Goals: Good Additional Goals Additional Goal #1: Pt will score >/= 22/24 on the DGI Progress towards PT goals: Progressing toward goals    Frequency    Min 3X/week      PT Plan Current plan remains appropriate       AM-PAC PT "6 Clicks" Mobility   Outcome  Measure  Help needed turning from your back to your side while in a flat bed without using bedrails?: A Little Help needed moving from lying on your back to sitting on the side of a flat bed without using bedrails?: A Little Help needed moving to and from a bed to a chair (including a wheelchair)?: A Little Help needed standing up from a chair using your arms (e.g., wheelchair or bedside chair)?: A Little Help needed to walk in hospital room?: A Little Help needed climbing 3-5 steps with a railing? : A Lot 6 Click Score: 17    End of Session Equipment Utilized During Treatment: Gait belt Activity Tolerance: Other (comment);Treatment limited secondary to agitation (Pt was slightly agitated stating " Is it your job to make people mad in the morning.") Patient left: with call bell/phone within reach;in bed;with bed alarm set Nurse Communication: Mobility status PT Visit Diagnosis: Unsteadiness on feet (R26.81);Other symptoms and signs involving the nervous system (R29.898);Muscle weakness (generalized) (M62.81)     Time: 0981-1914 PT Time Calculation (min) (ACUTE ONLY): 14 min  Charges:    $Therapeutic Activity: 8-22 mins PT General Charges $$ ACUTE PT VISIT: 1 Visit                    Harrel Carina, DPT, CLT  Acute Rehabilitation Services Office: [phone] (Secure chat preferred)    Claudia Desanctis 05/24/2023, 10:16 AM

## 2023-05-25 ENCOUNTER — Other Ambulatory Visit: Payer: Self-pay

## 2023-05-25 ENCOUNTER — Inpatient Hospital Stay (HOSPITAL_COMMUNITY)
Admission: RE | Admit: 2023-05-25 | Discharge: 2023-06-07 | DRG: 054 | Disposition: A | Discharge status: Home Health | Payer: Medicare Other | Source: Intra-hospital | Attending: Physical Medicine and Rehabilitation | Admitting: Physical Medicine and Rehabilitation

## 2023-05-25 ENCOUNTER — Encounter (HOSPITAL_COMMUNITY): Payer: Self-pay | Admitting: Physical Medicine and Rehabilitation

## 2023-05-25 DIAGNOSIS — G936 Cerebral edema: Secondary | ICD-10-CM | POA: Diagnosis present

## 2023-05-25 DIAGNOSIS — I252 Old myocardial infarction: Secondary | ICD-10-CM | POA: Diagnosis not present

## 2023-05-25 DIAGNOSIS — R569 Unspecified convulsions: Secondary | ICD-10-CM | POA: Diagnosis present

## 2023-05-25 DIAGNOSIS — D320 Benign neoplasm of cerebral meninges: Secondary | ICD-10-CM

## 2023-05-25 DIAGNOSIS — Z85038 Personal history of other malignant neoplasm of large intestine: Secondary | ICD-10-CM | POA: Diagnosis not present

## 2023-05-25 DIAGNOSIS — Z7984 Long term (current) use of oral hypoglycemic drugs: Secondary | ICD-10-CM

## 2023-05-25 DIAGNOSIS — Z79899 Other long term (current) drug therapy: Secondary | ICD-10-CM | POA: Diagnosis not present

## 2023-05-25 DIAGNOSIS — Z8249 Family history of ischemic heart disease and other diseases of the circulatory system: Secondary | ICD-10-CM

## 2023-05-25 DIAGNOSIS — Z951 Presence of aortocoronary bypass graft: Secondary | ICD-10-CM | POA: Diagnosis not present

## 2023-05-25 DIAGNOSIS — Z87891 Personal history of nicotine dependence: Secondary | ICD-10-CM | POA: Diagnosis not present

## 2023-05-25 DIAGNOSIS — R451 Restlessness and agitation: Secondary | ICD-10-CM | POA: Diagnosis not present

## 2023-05-25 DIAGNOSIS — R4587 Impulsiveness: Secondary | ICD-10-CM

## 2023-05-25 DIAGNOSIS — Z794 Long term (current) use of insulin: Secondary | ICD-10-CM | POA: Diagnosis not present

## 2023-05-25 DIAGNOSIS — D329 Benign neoplasm of meninges, unspecified: Principal | ICD-10-CM | POA: Diagnosis present

## 2023-05-25 DIAGNOSIS — R7989 Other specified abnormal findings of blood chemistry: Secondary | ICD-10-CM | POA: Diagnosis not present

## 2023-05-25 DIAGNOSIS — Z79890 Hormone replacement therapy (postmenopausal): Secondary | ICD-10-CM

## 2023-05-25 DIAGNOSIS — I2510 Atherosclerotic heart disease of native coronary artery without angina pectoris: Secondary | ICD-10-CM | POA: Diagnosis present

## 2023-05-25 DIAGNOSIS — Z7409 Other reduced mobility: Secondary | ICD-10-CM | POA: Diagnosis present

## 2023-05-25 DIAGNOSIS — Z7982 Long term (current) use of aspirin: Secondary | ICD-10-CM | POA: Diagnosis not present

## 2023-05-25 DIAGNOSIS — E119 Type 2 diabetes mellitus without complications: Secondary | ICD-10-CM | POA: Diagnosis present

## 2023-05-25 DIAGNOSIS — E1165 Type 2 diabetes mellitus with hyperglycemia: Secondary | ICD-10-CM

## 2023-05-25 DIAGNOSIS — R262 Difficulty in walking, not elsewhere classified: Secondary | ICD-10-CM | POA: Diagnosis present

## 2023-05-25 DIAGNOSIS — K219 Gastro-esophageal reflux disease without esophagitis: Secondary | ICD-10-CM | POA: Diagnosis present

## 2023-05-25 DIAGNOSIS — N3941 Urge incontinence: Secondary | ICD-10-CM | POA: Diagnosis not present

## 2023-05-25 DIAGNOSIS — E039 Hypothyroidism, unspecified: Secondary | ICD-10-CM | POA: Diagnosis present

## 2023-05-25 DIAGNOSIS — Z9049 Acquired absence of other specified parts of digestive tract: Secondary | ICD-10-CM

## 2023-05-25 DIAGNOSIS — F0670 Mild neurocognitive disorder due to known physiological condition without behavioral disturbance: Principal | ICD-10-CM

## 2023-05-25 DIAGNOSIS — R0602 Shortness of breath: Secondary | ICD-10-CM | POA: Diagnosis not present

## 2023-05-25 DIAGNOSIS — Z86011 Personal history of benign neoplasm of the brain: Secondary | ICD-10-CM

## 2023-05-25 DIAGNOSIS — I10 Essential (primary) hypertension: Secondary | ICD-10-CM | POA: Diagnosis present

## 2023-05-25 DIAGNOSIS — Z833 Family history of diabetes mellitus: Secondary | ICD-10-CM

## 2023-05-25 DIAGNOSIS — E785 Hyperlipidemia, unspecified: Secondary | ICD-10-CM | POA: Diagnosis present

## 2023-05-25 DIAGNOSIS — G3184 Mild cognitive impairment, so stated: Secondary | ICD-10-CM | POA: Diagnosis present

## 2023-05-25 DIAGNOSIS — Z483 Aftercare following surgery for neoplasm: Secondary | ICD-10-CM

## 2023-05-25 DIAGNOSIS — E11649 Type 2 diabetes mellitus with hypoglycemia without coma: Secondary | ICD-10-CM | POA: Diagnosis not present

## 2023-05-25 DIAGNOSIS — R32 Unspecified urinary incontinence: Secondary | ICD-10-CM | POA: Diagnosis not present

## 2023-05-25 LAB — GLUCOSE, CAPILLARY
Glucose-Capillary: 172 mg/dL — ABNORMAL HIGH (ref 70–99)
Glucose-Capillary: 213 mg/dL — ABNORMAL HIGH (ref 70–99)
Glucose-Capillary: 266 mg/dL — ABNORMAL HIGH (ref 70–99)
Glucose-Capillary: 188 mg/dL — ABNORMAL HIGH (ref 70–99)

## 2023-05-25 MED ORDER — METOPROLOL TARTRATE 25 MG PO TABS
25.0000 mg | ORAL_TABLET | Freq: Every day | ORAL | Status: DC
  Administered 2023-05-26 – 2023-06-07 (×13): 25 mg via ORAL
  Filled 2023-05-25 (×13): qty 1

## 2023-05-25 MED ORDER — ONDANSETRON HCL 4 MG PO TABS: 4.0000 mg | ORAL_TABLET | Freq: Four times a day (QID) | ORAL | Status: DC | PRN

## 2023-05-25 MED ORDER — DEXAMETHASONE 4 MG PO TABS
2.0000 mg | ORAL_TABLET | Freq: Two times a day (BID) | ORAL | Status: DC
  Administered 2023-05-25 – 2023-05-27 (×5): 2 mg via ORAL
  Filled 2023-05-25 (×5): qty 1

## 2023-05-25 MED ORDER — FESOTERODINE FUMARATE ER 4 MG PO TB24
4.0000 mg | ORAL_TABLET | Freq: Every day | ORAL | Status: DC
  Administered 2023-05-26 – 2023-06-07 (×13): 4 mg via ORAL
  Filled 2023-05-25 (×14): qty 1

## 2023-05-25 MED ORDER — ACETAMINOPHEN 325 MG PO TABS
650.0000 mg | ORAL_TABLET | Freq: Four times a day (QID) | ORAL | Status: DC | PRN
  Administered 2023-06-02: 650 mg via ORAL
  Filled 2023-05-25: qty 2

## 2023-05-25 MED ORDER — TRAZODONE HCL 50 MG PO TABS: 50.0000 mg | ORAL_TABLET | Freq: Every evening | ORAL | Status: DC | PRN

## 2023-05-25 MED ORDER — ATORVASTATIN CALCIUM 10 MG PO TABS
10.0000 mg | ORAL_TABLET | Freq: Every day | ORAL | Status: DC
  Administered 2023-05-25 – 2023-06-06 (×13): 10 mg via ORAL
  Filled 2023-05-25 (×13): qty 1

## 2023-05-25 MED ORDER — PANTOPRAZOLE SODIUM 40 MG PO TBEC
40.0000 mg | DELAYED_RELEASE_TABLET | Freq: Every day | ORAL | Status: DC
  Administered 2023-05-26 – 2023-06-07 (×13): 40 mg via ORAL
  Filled 2023-05-25 (×13): qty 1

## 2023-05-25 MED ORDER — ADULT MULTIVITAMIN W/MINERALS CH
1.0000 | ORAL_TABLET | Freq: Every day | ORAL | Status: DC
  Administered 2023-05-26 – 2023-06-06 (×12): 1 via ORAL
  Filled 2023-05-25 (×12): qty 1

## 2023-05-25 MED ORDER — LEVOTHYROXINE SODIUM 75 MCG PO TABS
75.0000 ug | ORAL_TABLET | Freq: Every morning | ORAL | Status: DC
  Administered 2023-05-26 – 2023-06-07 (×13): 75 ug via ORAL
  Filled 2023-05-25 (×13): qty 1

## 2023-05-25 MED ORDER — INSULIN ASPART 100 UNIT/ML IJ SOLN
0.0000 [IU] | Freq: Three times a day (TID) | INTRAMUSCULAR | Status: DC
  Administered 2023-05-26 (×2): 4 [IU] via SUBCUTANEOUS
  Administered 2023-05-28: 3 [IU] via SUBCUTANEOUS
  Administered 2023-05-29: 11 [IU] via SUBCUTANEOUS
  Administered 2023-05-29 – 2023-05-30 (×2): 4 [IU] via SUBCUTANEOUS
  Administered 2023-05-30: 3 [IU] via SUBCUTANEOUS
  Administered 2023-05-31: 7 [IU] via SUBCUTANEOUS
  Administered 2023-05-31 – 2023-06-01 (×2): 4 [IU] via SUBCUTANEOUS
  Administered 2023-06-01: 3 [IU] via SUBCUTANEOUS
  Administered 2023-06-02: 7 [IU] via SUBCUTANEOUS
  Administered 2023-06-02 – 2023-06-03 (×2): 3 [IU] via SUBCUTANEOUS
  Administered 2023-06-03 – 2023-06-04 (×3): 4 [IU] via SUBCUTANEOUS
  Administered 2023-06-05: 7 [IU] via SUBCUTANEOUS
  Administered 2023-06-06: 3 [IU] via SUBCUTANEOUS
  Administered 2023-06-06: 7 [IU] via SUBCUTANEOUS
  Administered 2023-06-06: 3 [IU] via SUBCUTANEOUS
  Administered 2023-06-07: 4 [IU] via SUBCUTANEOUS

## 2023-05-25 MED ORDER — ENSURE ENLIVE PO LIQD
237.0000 mL | Freq: Two times a day (BID) | ORAL | Status: DC
  Administered 2023-05-26 – 2023-06-05 (×20): 237 mL via ORAL

## 2023-05-25 MED ORDER — HYDROCODONE-ACETAMINOPHEN 5-325 MG PO TABS
1.0000 | ORAL_TABLET | ORAL | Status: DC | PRN
  Filled 2023-05-25: qty 1

## 2023-05-25 MED ORDER — VITAMIN D 25 MCG (1000 UNIT) PO TABS
2000.0000 [IU] | ORAL_TABLET | Freq: Every day | ORAL | Status: DC
  Administered 2023-05-26 – 2023-05-31 (×6): 2000 [IU] via ORAL
  Filled 2023-05-25: qty 2

## 2023-05-25 MED ORDER — ONDANSETRON HCL 4 MG/2ML IJ SOLN: 4.0000 mg | Freq: Four times a day (QID) | INTRAMUSCULAR | Status: DC | PRN

## 2023-05-25 MED ORDER — NITROGLYCERIN 0.4 MG SL SUBL: 0.4000 mg | SUBLINGUAL_TABLET | SUBLINGUAL | Status: DC | PRN

## 2023-05-25 MED ORDER — MEMANTINE HCL 10 MG PO TABS
5.0000 mg | ORAL_TABLET | Freq: Two times a day (BID) | ORAL | Status: DC
  Administered 2023-05-25 – 2023-06-07 (×26): 5 mg via ORAL
  Filled 2023-05-25 (×26): qty 1

## 2023-05-25 MED ORDER — METFORMIN HCL 500 MG PO TABS
1000.0000 mg | ORAL_TABLET | Freq: Two times a day (BID) | ORAL | Status: DC
  Administered 2023-05-26 – 2023-06-07 (×25): 1000 mg via ORAL
  Filled 2023-05-25 (×25): qty 2

## 2023-05-25 MED ORDER — QUETIAPINE FUMARATE 25 MG PO TABS
25.0000 mg | ORAL_TABLET | Freq: Every day | ORAL | Status: DC | PRN
  Filled 2023-05-25: qty 1

## 2023-05-25 MED ORDER — ACETAMINOPHEN 650 MG RE SUPP: 650.0000 mg | Freq: Four times a day (QID) | RECTAL | Status: DC | PRN

## 2023-05-25 MED ORDER — VITAMIN B-12 1000 MCG PO TABS
1000.0000 ug | ORAL_TABLET | Freq: Every day | ORAL | Status: DC
  Administered 2023-05-26 – 2023-06-06 (×12): 1000 ug via ORAL
  Filled 2023-05-25 (×12): qty 1

## 2023-05-25 MED ORDER — LEVETIRACETAM 500 MG PO TABS
500.0000 mg | ORAL_TABLET | Freq: Two times a day (BID) | ORAL | Status: DC
  Administered 2023-05-25 – 2023-05-30 (×10): 500 mg via ORAL
  Filled 2023-05-25 (×11): qty 1

## 2023-05-25 MED ORDER — DOCUSATE SODIUM 100 MG PO CAPS
100.0000 mg | ORAL_CAPSULE | Freq: Two times a day (BID) | ORAL | Status: DC
  Administered 2023-05-25 – 2023-06-06 (×25): 100 mg via ORAL
  Filled 2023-05-25 (×25): qty 1

## 2023-05-25 MED ORDER — ASPIRIN 81 MG PO TBEC
81.0000 mg | DELAYED_RELEASE_TABLET | Freq: Every day | ORAL | Status: DC
  Administered 2023-05-26 – 2023-06-07 (×13): 81 mg via ORAL
  Filled 2023-05-25 (×13): qty 1

## 2023-05-25 MED ORDER — INSULIN GLARGINE-YFGN 100 UNIT/ML ~~LOC~~ SOLN
60.0000 [IU] | Freq: Every day | SUBCUTANEOUS | Status: DC
  Administered 2023-05-26: 60 [IU] via SUBCUTANEOUS
  Filled 2023-05-25 (×2): qty 0.6

## 2023-05-25 NOTE — H&P (Signed)
Physical Medicine and Rehabilitation Admission H&P        Chief Complaint  Patient presents with   Altered Mental Status  : HPI: [name] [name] is a 77 Y/O male with history of with H/O CAD/CABG HTN DM HLD as well as colon cancer and bifrontal craniotomy resection for meningioma 2 years ago.Lives with spouse Independent PTA Wife does assist withADLS.  Presented 05/08/2023 with AMS and mood changes.Ct imaging showed large bi frontal extra axial tumor with mass effect.Underwent planned stereotactic cranio recection for tumor like;y meningioma7/2/24 per Dr Jake Samples.  Decadron taper as indicated.Decadron taper as indicated. Keppra for seizure prophylaxis..Low dose aspirin resumed for H/O CAD.Tolerating a regular dirt Therapy eval completed and due to limited mobility was admitted for a comprehensive rehab program.     Review of Systems  Constitutional:  Positive for malaise/fatigue. Negative for chills and fever.  HENT:  Negative for hearing loss.   Eyes:  Negative for blurred vision and double vision.  Respiratory:  Negative for cough, shortness of breath and wheezing.   Cardiovascular:  Negative for chest pain, palpitations and leg swelling.  Gastrointestinal:        GERD  Genitourinary:  Negative for dysuria, flank pain and hematuria.  Musculoskeletal:  Positive for joint pain and myalgias.  Skin:  Negative for rash.  Neurological:  Positive for weakness.  Psychiatric/Behavioral:  Positive for memory loss.   All other systems reviewed and are negative.       Past Medical History:  Diagnosis Date   Bladder infection      10th grade   CAD (coronary artery disease)      s/p 3V CABG 2001   Cervical spondylosis 2002    History of   Colon cancer (HCC)     Diabetes mellitus (HCC)     Dysphagia     GERD (gastroesophageal reflux disease)     Heart murmur      pt has had echo   HTN (hypertension)     Hyperlipidemia     Hypothyroidism     LVH (left ventricular hypertrophy) 2015    Mild    Myocardial infarction (HCC)      x2   PVC (premature ventricular contraction)      a. s/p ablation   Tobacco abuse, in remission               Past Surgical History:  Procedure Laterality Date   ANTERIOR CERVICAL DISCECTOMY   03/20/2001    Many levels.  Dr Franky Macho   APPLICATION OF CRANIAL NAVIGATION N/A 05/08/2023    Procedure: APPLICATION OF CRANIAL NAVIGATION;  Surgeon: Dawley, Alan Mulder, DO;  Location: [facility] OR;  Service: Neurosurgery;  Laterality: N/A;   Bone spur removal right shoulder       CHOLECYSTECTOMY       CORONARY ARTERY BYPASS GRAFT   10/23/2000    x3 Dr Tyrone Sage   CRANIOTOMY N/A 05/08/2023    Procedure: BIFRONTAL CRANIOTOMY TUMOR EXCISION;  Surgeon: Bethann Goo, DO;  Location: [facility] OR;  Service: Neurosurgery;  Laterality: N/A;   FACIAL FRACTURE SURGERY        after being hit with baseball   MOUTH SURGERY       Pineal cyst removal       PROCTOSCOPY N/A 09/19/2018    Procedure: RIGID PROCTOSCOPY;  Surgeon: Karie Soda, MD;  Location: [facility];  Service: General;  Laterality: N/A;   V-TACH ABLATION N/A 02/15/2015    PVC ablation by Dr Ladona Ridgel  Family History  Problem Relation Age of Onset   Heart attack Father 74   Diabetes Father     Diabetes Mother     Heart attack Paternal Grandfather 68        Social History:  reports that he quit smoking about 22 years ago. His smoking use included cigarettes. He started smoking about 57 years ago. He has a 105 pack-year smoking history. He has never used smokeless tobacco. He reports that he does not drink alcohol and does not use drugs. Allergies:  Allergies  No Known Allergies         Medications Prior to Admission  Medication Sig Dispense Refill   aspirin 81 MG tablet Take 1 tablet (81 mg total) by mouth daily. 30 tablet 0   atorvastatin (LIPITOR) 10 MG tablet Take 10 mg by mouth at bedtime.    0   Cholecalciferol (VITAMIN D3) 50 MCG (2000 UT) TABS Take 2,000 Units by mouth daily.        HYDROcodone-acetaminophen (NORCO/VICODIN) 5-325 MG tablet Take 1 tablet by mouth every 4 (four) hours as needed for moderate pain. 30 tablet 0   insulin glargine (LANTUS) 100 UNIT/ML Solostar Pen Inject 60 Units into the skin daily.       levothyroxine (SYNTHROID) 75 MCG tablet Take 75 mcg by mouth every morning.       memantine (NAMENDA) 5 MG tablet Take 1 tablet (5 mg at night) for 2 weeks, then increase to 1 tablet (5 mg) twice a day 60 tablet 11   metFORMIN (GLUCOPHAGE) 1000 MG tablet Take 1,000 mg by mouth 2 (two) times daily.   3   metoprolol tartrate (LOPRESSOR) 25 MG tablet Take 25 mg by mouth daily at 6 (six) AM.       nitroGLYCERIN (NITROSTAT) 0.4 MG SL tablet Place 1 tablet (0.4 mg total) under the tongue every 5 (five) minutes as needed for chest pain. 25 tablet 6   ondansetron (ZOFRAN) 4 MG tablet Take 1 tablet (4 mg total) by mouth every 6 (six) hours as needed for nausea. 20 tablet 0   pantoprazole (PROTONIX) 40 MG tablet Take 1 tablet (40 mg total) by mouth 2 (two) times daily before a meal. 60 tablet 0   polyethylene glycol (MIRALAX / GLYCOLAX) 17 g packet Take 17 g by mouth daily as needed for mild constipation. 14 each 0   solifenacin (VESICARE) 10 MG tablet Take 10 mg by mouth daily.       acetaminophen (TYLENOL) 325 MG tablet Take 2 tablets (650 mg total) by mouth every 6 (six) hours as needed for mild pain (or Fever >/= 101). (Patient not taking: Reported on 05/21/2023) 20 tablet 0   [EXPIRED] dexamethasone (DECADRON) 1 MG tablet Take 2 tablets (2 mg total) by mouth with breakfast, with lunch, and with evening meal for 3 days, THEN 2 tablets (2 mg total) 2 (two) times daily with a meal for 3 days, THEN 1 tablet (1 mg total) 2 (two) times daily with a meal for 3 days, THEN 1 tablet (1 mg total) daily with breakfast for 3 days. (Patient not taking: Reported on 05/21/2023) 39 tablet 0   levETIRAcetam (KEPPRA) 500 MG tablet Take 1 tablet (500 mg total) by mouth every 12 (twelve) hours  for 14 days. (Patient not taking: Reported on 05/21/2023) 28 tablet 0              Home: Home Living Family/patient expects to be discharged to:: Private residence Living Arrangements:  Spouse/significant other Available Help at Discharge: Family Type of Home: House Home Access: Stairs to enter Secretary/administrator of Steps: 3 Entrance Stairs-Rails: Left Home Layout: One level Bathroom Shower/Tub: Health visitor: Handicapped height Bathroom Accessibility: Yes Home Equipment: Agricultural consultant (2 wheels), Shower seat - built in, Dennis - single point, Hydrologist, BSC/3in1   Functional History: Prior Function Prior Level of Function : Needs assist, Patient poor historian/Family not available  Cognitive Assist : Mobility (cognitive), ADLs (cognitive) Mobility (Cognitive): Intermittent cues Physical Assist : Mobility (physical), ADLs (physical) Mobility (physical): Transfers, Gait Mobility Comments: During recent admission pt requiring min guard to min assist for ambulation without assistive device. prior to surgery, no AD needed and pt moving fairly well ADLs Comments: Pt reports independence with ADLs though per chart, recent difficulty since DC s/p sx. Per prior admission, spouse assisting with IADLs though pt reports able to cook at home usually (?). pt also reports going hunting   Functional Status:  Mobility: Bed Mobility Overal bed mobility: Needs Assistance Bed Mobility: Supine to Sit Supine to sit: Min assist, HOB elevated General bed mobility comments: did not respond to EOB prompts Transfers Overall transfer level: Needs assistance Equipment used: Rolling walker (2 wheels) Transfers: Sit to/from Stand Sit to Stand: Min guard General transfer comment: did not respond to OOB prompts Ambulation/Gait Ambulation/Gait assistance: Min assist Gait Distance (Feet): 160 Feet Assistive device: 1 person hand held assist, Rolling walker (2  wheels) Gait Pattern/deviations: Step-through pattern, Decreased stride length General Gait Details: Assist for balance throughout due to unsteady gait. Walker improved stability somewhat. Gait velocity: decr Gait velocity interpretation: 1.31 - 2.62 ft/sec, indicative of limited community ambulator   ADL: ADL Overall ADL's : Needs assistance/impaired Eating/Feeding: Modified independent, Sitting Eating/Feeding Details (indicate cue type and reason): placing ice chips in diet coke on entry Grooming: Min guard, Standing, Minimal assistance, Wash/dry face, Moderate assistance Grooming Details (indicate cue type and reason): min guard for balance but variable cognitive assist/cues for attention to tasks, completion of tasks. pt unscrewing deodorant cap, attempting to go under gait belt to underarm. when OT moved gait belt, pt had reported no need for deodorant and began wiping sink with washcloth Upper Body Bathing: Minimal assistance, Sitting Lower Body Bathing: Moderate assistance, Sitting/lateral leans, Sit to/from stand Upper Body Dressing : Minimal assistance Lower Body Dressing: Moderate assistance Lower Body Dressing Details (indicate cue type and reason): Provided fresh grip socks though pt appeared to be distracted (likely voluntarily distracted) and ultimately asking wife to assist who then donned pt's socks. Toilet Transfer: Min guard, Ambulation, Rolling walker (2 wheels) Toileting- Clothing Manipulation and Hygiene: Moderate assistance, Sit to/from stand, Sitting/lateral lean Functional mobility during ADLs: Min guard, Rolling walker (2 wheels), Cueing for safety, Cueing for sequencing General ADL Comments: Pt focus on attention with menu provided to problem solve/locate food items and determine how to order food w/ assist - fair problem solving. when attempting prompts for EOB, pt w/poor initiation, attention and questionably self limiting. Pt also reporting need to go to bathroom to  urinate (OT encouraged this despite condom cath d/t recent issues w/ incontinence in an effort to retrain bladder). However, unable to prompt pt to EOB/OOB   Cognition: Cognition Overall Cognitive Status: Impaired/Different from baseline Orientation Level: Oriented X4 Cognition Arousal/Alertness: Awake/alert Behavior During Therapy: Flat affect Overall Cognitive Status: Impaired/Different from baseline Area of Impairment: Following commands, Awareness, Memory, Safety/judgement, Problem solving, Attention Orientation Level: Time, Situation Current Attention Level: Selective, Sustained  Memory: Decreased short-term memory Following Commands: Follows one step commands inconsistently, Follows one step commands with increased time Safety/Judgement: Decreased awareness of deficits Awareness: Intellectual, Emergent Problem Solving: Slow processing, Decreased initiation, Difficulty sequencing, Requires verbal cues, Requires tactile cues General Comments: pt laughing appropriately to humor though intermittent mild agitated remarks. attention [facility] for looking through menu to call, attempt to order muffin though memory deficist noted when pt later asked what was determined about the muffin though OT had educated that the computers were down. Pt also with decreased memory fo situation reporting he had been here 15 days and did not go home right after surgery. very poor initiation of attempts for OOB tasks despite OT's various approaches with even wife entering to prompt   Physical Exam: Blood pressure 127/65, pulse 61, temperature 97.7 F (36.5 C), temperature source Oral, resp. rate 16, height 5\' 6"  (1.676 m), weight 82.9 kg, SpO2 98%. Physical Exam Constitutional:      Appearance: He is not ill-appearing.  HENT:     Head:     Comments: Long bifrontal incision, clean dry and well-approximated. Staples removed.    Right Ear: External ear normal.     Left Ear: External ear normal.     Nose: Nose  normal.     Mouth/Throat:     Mouth: Mucous membranes are moist.     Pharynx: Oropharynx is clear.  Eyes:     Conjunctiva/sclera: Conjunctivae normal.  Cardiovascular:     Rate and Rhythm: Normal rate and regular rhythm.     Heart sounds: No murmur heard.    No gallop.  Pulmonary:     Effort: Pulmonary effort is normal. No respiratory distress.     Breath sounds: No wheezing.  Abdominal:     General: Bowel sounds are normal. There is no distension.     Palpations: Abdomen is soft.     Tenderness: There is no abdominal tenderness.  Musculoskeletal:        General: No swelling or tenderness. Normal range of motion.     Cervical back: Normal range of motion.  Skin:    General: Skin is warm and dry.     Findings: No lesion or rash.  Neurological:     Mental Status: He is alert.     Comments: Pt is alert and oriented to person, hospital (when given choices), month/year. Follows basic commands, normal language, speech clear. Pt is distracted and sometimes needs cues to stay on task. Delays with processing and initiation as well. Motor: 4+/5 BUE, LE: 4-/5 HF, 4/5 KE, 5/5 ADF/PF. Sensory exam normal for light touch and pain in all 4 limbs. No limb ataxia or cerebellar signs. No abnormal tone appreciated.       Patient is alert and limited historian Follow commands with limited recall  Psychiatric:     Comments: Pt generally flat, is cooperative        Lab Results Last 48 Hours        Results for orders placed or performed during the hospital encounter of 05/21/23 (from the past 48 hour(s))  Glucose, capillary     Status: Abnormal    Collection Time: 05/23/23  5:19 PM  Result Value Ref Range    Glucose-Capillary 126 (H) 70 - 99 mg/dL      Comment: Glucose reference range applies only to samples taken after fasting for at least 8 hours.    Comment 1 Notify RN      Comment 2 Document in Chart  Glucose, capillary     Status: Abnormal    Collection Time: 05/23/23  9:01 PM  Result  Value Ref Range    Glucose-Capillary 165 (H) 70 - 99 mg/dL      Comment: Glucose reference range applies only to samples taken after fasting for at least 8 hours.  Glucose, capillary     Status: Abnormal    Collection Time: 05/23/23  9:17 PM  Result Value Ref Range    Glucose-Capillary 172 (H) 70 - 99 mg/dL      Comment: Glucose reference range applies only to samples taken after fasting for at least 8 hours.  Glucose, capillary     Status: None    Collection Time: 05/24/23  6:19 AM  Result Value Ref Range    Glucose-Capillary 90 70 - 99 mg/dL      Comment: Glucose reference range applies only to samples taken after fasting for at least 8 hours.  Glucose, capillary     Status: Abnormal    Collection Time: 05/24/23 11:27 AM  Result Value Ref Range    Glucose-Capillary 143 (H) 70 - 99 mg/dL      Comment: Glucose reference range applies only to samples taken after fasting for at least 8 hours.  Glucose, capillary     Status: Abnormal    Collection Time: 05/24/23  6:00 PM  Result Value Ref Range    Glucose-Capillary 231 (H) 70 - 99 mg/dL      Comment: Glucose reference range applies only to samples taken after fasting for at least 8 hours.  Glucose, capillary     Status: Abnormal    Collection Time: 05/24/23  9:41 PM  Result Value Ref Range    Glucose-Capillary 121 (H) 70 - 99 mg/dL      Comment: Glucose reference range applies only to samples taken after fasting for at least 8 hours.  Glucose, capillary     Status: Abnormal    Collection Time: 05/25/23  6:46 AM  Result Value Ref Range    Glucose-Capillary 172 (H) 70 - 99 mg/dL      Comment: Glucose reference range applies only to samples taken after fasting for at least 8 hours.  Glucose, capillary     Status: Abnormal    Collection Time: 05/25/23 12:50 PM  Result Value Ref Range    Glucose-Capillary 213 (H) 70 - 99 mg/dL      Comment: Glucose reference range applies only to samples taken after fasting for at least 8 hours.       Imaging Results (Last 48 hours)  No results found.         Blood pressure 127/65, pulse 61, temperature 97.7 F (36.5 C), temperature source Oral, resp. rate 16, height 5\' 6"  (1.676 m), weight 82.9 kg, SpO2 98%.   Medical Problem List and Plan: 1. Functional deficits secondary to Large bi frontal axial tumor suspect meningioma.S/O cranio resection 7/2 with H/O meningioma two years ago.Decadron taper             -patient may  shower             -ELOS/Goals: 6-9 days, supervision goals with PT, OT, sup-min assist with cognition and behavior 2.  Antithrombotics: -DVT/anticoagulation:  Mechanical: Antiembolism stockings, thigh (TED hose) Bilateral lower extremities             -antiplatelet therapy: ASA 81 mg daily 3. Pain Management: Hydrocodone as needed 4. Mood/Behavior/Sleep:               -  behavior improved after steroids. However decadron being tapered -observe for increased agitation/confusion. -maximize sleep/wake cycle              -antipsychotic agents: seroquel 25mg  every day prn agitation/sleep             -trazodone 50mg  at bedtime prn              -sleep chart 5. Neuropsych/cognition: This patient is not capable of making decisions on her own behalf.             -namenda 5mg  bid per home dosing 6. Skin/Wound Care: Routine skin checks 7. Fluids/Electrolytes/Nutrition: routine lab folow up 8 Seizure prophylaxis.keppra 500 mg BID 9.Hypothyroid.Synthroid 10.DM.Semglee 60 unit daily and Glucophage 1000 mg bid  -SSI/Monitor while on decadron 11.GERD.Protonix 12.Hyperlipidemia.Lipitor 13.HTN.Lopressor 25 mg BID ` -bp controlled at present     Charlton Amor, PA-C 05/25/2023  I have personally performed a face to face diagnostic evaluation of this patient and formulated the key components of the plan.  Additionally, I have personally reviewed laboratory data, imaging studies, as well as relevant notes and concur with the physician assistant's documentation above.  The  patient's status has not changed from the original H&P.  Any changes in documentation from the acute care chart have been noted above.  Ranelle Oyster, MD, [place] [facility]

## 2023-05-25 NOTE — Progress Notes (Signed)
Patient discharged to [facility].

## 2023-05-25 NOTE — Progress Notes (Signed)
Inpatient Rehab Admissions Coordinator:   Received confirmation that expedited appeal was received at 16:35 yesterday and will render a determination within 72 hours.  We will follow.   Estill Dooms, PT, DPT Admissions Coordinator [phone] 05/25/23  10:24 AM

## 2023-05-25 NOTE — Progress Notes (Signed)
Mobility Specialist Progress Note   05/25/23 1613  Mobility  Activity Ambulated with assistance in hallway  Level of Assistance Minimal assist, patient does 75% or more  Assistive Device Front wheel walker  Distance Ambulated (ft) 260 ft  Activity Response Tolerated well  Mobility Referral Yes  $Mobility charge 1 Mobility  Mobility Specialist Start Time (ACUTE ONLY) 1502  Mobility Specialist Stop Time (ACUTE ONLY) 1550  Mobility Specialist Time Calculation (min) (ACUTE ONLY) 48 min   Received pt in bed expressing no complaints and agreeable to mobility. Pt requiring heavy cues for task orientation but requiring less tactile cues this session to initiate movements and transitions. Pt ambulating in hallway w/ a steady gate but requiring moderate directional cues. Returned back to room and placed in bed, pt needing increased time to process cues for laying supine. Left w/ call bell in reach and bed alarm on.  Frederico Hamman Mobility Specialist Please contact via SecureChat or  Rehab office at [phone]

## 2023-05-25 NOTE — Progress Notes (Signed)
Inpatient Rehabilitation Admission Medication Review by a Pharmacist  A complete drug regimen review was completed for this patient to identify any potential clinically significant medication issues.  High Risk Drug Classes Is patient taking? Indication by Medication  Antipsychotic Yes Seroquel prn sleep, agitation  Anticoagulant No   Antibiotic No   Opioid Yes Vicodin prn pain  Antiplatelet No   Hypoglycemics/insulin Yes Insulin, Metformin - DM  Vasoactive Medication Yes Metoprolol - HTN  Chemotherapy No   Other Yes Dexamethasone - meningioma Zofran prn N/V Atorvastatin - HLD Keppra - seizure ppx Levothyroxine - low thyroid Fesoterodine - incontinence Namenda - dementia Pantoprazole - Reflux NTG SL prn CP     Type of Medication Issue Identified Description of Issue Recommendation(s)  Drug Interaction(s) (clinically significant)     Duplicate Therapy     Allergy     No Medication Administration End Date     Incorrect Dose     Additional Drug Therapy Needed     Significant med changes from prior encounter (inform family/care partners about these prior to discharge).    Other       Clinically significant medication issues were identified that warrant physician communication and completion of prescribed/recommended actions by midnight of the next day:  No  Name of provider notified for urgent issues identified:   Provider Method of Notification:     Pharmacist comments: None  Time spent performing this drug regimen review (minutes):  20 minutes Okey Regal, PharmD

## 2023-05-25 NOTE — Progress Notes (Signed)
Physical Medicine and Rehabilitation Consult Reason for Consult:altered mental status, agitated behavior Referring Physician: Pool     HPI: [name] [name] is a 77 y.o. 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 Pt was discharged home on 7/6 but developed increasingly agitated behavior and confusion. His wife was unable to manage him at home, and the pt was brought back to the hospital for evaluation on 05/21/23. HCT on re-eval demonstrated an epidural fluid collection deep to the bifrontal cranioplasty site slightly increased in size to 2.8cm with some mass effect on frontal lobes. Underlying edema new within the anterior right frontal lobe as well new foci of parenchymal hemorrhage in the anterior frontal lobes as well. Pt's steroid were increased with improved presentation today. PT evaluation is pending today.    Review of Systems  Unable to perform ROS: Mental acuity  Neurological:  Positive for headaches.        Past Medical History:  Diagnosis Date   Bladder infection      10th grade   CAD (coronary artery disease)      s/p 3V CABG 2001   Cervical spondylosis 2002    History of   Colon cancer (HCC)     Diabetes mellitus (HCC)     Dysphagia     GERD (gastroesophageal reflux disease)     Heart murmur      pt has had echo   HTN (hypertension)     Hyperlipidemia     Hypothyroidism     LVH (left ventricular hypertrophy) 2015    Mild   Myocardial infarction (HCC)      x2   PVC (premature ventricular contraction)      a. s/p ablation   Tobacco abuse, in remission               Past Surgical History:  Procedure Laterality Date   ANTERIOR CERVICAL DISCECTOMY   03/20/2001    Many levels.  Dr Franky Macho   APPLICATION OF CRANIAL NAVIGATION N/A 05/08/2023    Procedure: APPLICATION OF CRANIAL NAVIGATION;  Surgeon: Dawley, Alan Mulder, DO;  Location: [facility] OR;  Service: Neurosurgery;  Laterality:  N/A;   Bone spur removal right shoulder       CHOLECYSTECTOMY       CORONARY ARTERY BYPASS GRAFT   10/23/2000    x3 Dr Tyrone Sage   CRANIOTOMY N/A 05/08/2023    Procedure: BIFRONTAL CRANIOTOMY TUMOR EXCISION;  Surgeon: Bethann Goo, DO;  Location: [facility] OR;  Service: Neurosurgery;  Laterality: N/A;   FACIAL FRACTURE SURGERY        after being hit with baseball   MOUTH SURGERY       Pineal cyst removal       PROCTOSCOPY N/A 09/19/2018    Procedure: RIGID PROCTOSCOPY;  Surgeon: Karie Soda, MD;  Location: [facility] OR[facility];  Service: General;  Laterality: N/A;   V-TACH ABLATION N/A 02/15/2015    PVC ablation by Dr Ladona Ridgel             Family History  Problem Relation Age of Onset   Heart attack Father 43   Diabetes Father     Diabetes Mother     Heart attack Paternal Grandfather 62        Social History:  reports that he quit smoking about 22 years ago. His smoking use included cigarettes. He started smoking about 60  years ago. He has a 105 pack-year smoking history. He has never used smokeless tobacco. He reports that he does not drink alcohol and does not use drugs. Allergies:  Allergies  No Known Allergies         Medications Prior to Admission  Medication Sig Dispense Refill   aspirin 81 MG tablet Take 1 tablet (81 mg total) by mouth daily. 30 tablet 0   atorvastatin (LIPITOR) 10 MG tablet Take 10 mg by mouth at bedtime.    0   Cholecalciferol (VITAMIN D3) 50 MCG (2000 UT) TABS Take 2,000 Units by mouth daily.       HYDROcodone-acetaminophen (NORCO/VICODIN) 5-325 MG tablet Take 1 tablet by mouth every 4 (four) hours as needed for moderate pain. 30 tablet 0   insulin glargine (LANTUS) 100 UNIT/ML Solostar Pen Inject 60 Units into the skin daily.       levothyroxine (SYNTHROID) 75 MCG tablet Take 75 mcg by mouth every morning.       memantine (NAMENDA) 5 MG tablet Take 1 tablet (5 mg at night) for 2 weeks, then increase to 1 tablet (5 mg) twice a day 60 tablet 11   metFORMIN (GLUCOPHAGE)  1000 MG tablet Take 1,000 mg by mouth 2 (two) times daily.   3   metoprolol tartrate (LOPRESSOR) 25 MG tablet Take 25 mg by mouth daily at 6 (six) AM.       nitroGLYCERIN (NITROSTAT) 0.4 MG SL tablet Place 1 tablet (0.4 mg total) under the tongue every 5 (five) minutes as needed for chest pain. 25 tablet 6   ondansetron (ZOFRAN) 4 MG tablet Take 1 tablet (4 mg total) by mouth every 6 (six) hours as needed for nausea. 20 tablet 0   pantoprazole (PROTONIX) 40 MG tablet Take 1 tablet (40 mg total) by mouth 2 (two) times daily before a meal. 60 tablet 0   polyethylene glycol (MIRALAX / GLYCOLAX) 17 g packet Take 17 g by mouth daily as needed for mild constipation. 14 each 0   solifenacin (VESICARE) 10 MG tablet Take 10 mg by mouth daily.       acetaminophen (TYLENOL) 325 MG tablet Take 2 tablets (650 mg total) by mouth every 6 (six) hours as needed for mild pain (or Fever >/= 101). (Patient not taking: Reported on 05/21/2023) 20 tablet 0   dexamethasone (DECADRON) 1 MG tablet Take 2 tablets (2 mg total) by mouth with breakfast, with lunch, and with evening meal for 3 days, THEN 2 tablets (2 mg total) 2 (two) times daily with a meal for 3 days, THEN 1 tablet (1 mg total) 2 (two) times daily with a meal for 3 days, THEN 1 tablet (1 mg total) daily with breakfast for 3 days. (Patient not taking: Reported on 05/21/2023) 39 tablet 0   levETIRAcetam (KEPPRA) 500 MG tablet Take 1 tablet (500 mg total) by mouth every 12 (twelve) hours for 14 days. (Patient not taking: Reported on 05/21/2023) 28 tablet 0          Home: Home Living Family/patient expects to be discharged to:: Private residence Living Arrangements: Spouse/significant other Available Help at Discharge: Family Type of Home: House Home Access: Stairs to enter Secretary/administrator of Steps: 3 Entrance Stairs-Rails: Left Home Layout: One level Bathroom Shower/Tub: Health visitor: Handicapped height Bathroom Accessibility:  Yes Home Equipment: Agricultural consultant (2 wheels), Information systems manager - built in, Outlook - single point, Hydrologist, BSC/3in1  Functional History: Functional Status:  Mobility:  ADL:   Cognition: Cognition Orientation Level: Oriented to person, Oriented to place, Disoriented to time, Disoriented to situation   Blood pressure 114/70, pulse (!) 51, temperature 98.4 F (36.9 C), temperature source Oral, resp. rate 20, height 5\' 6"  (1.676 m), weight 71.7 kg, SpO2 98%. Physical Exam Constitutional:      General: He is not in acute distress. HENT:     Head:     Comments: Bifrontal crani incision cdi with staples    Right Ear: External ear normal.     Left Ear: External ear normal.     Nose: Nose normal.  Eyes:     Pupils: Pupils are equal, round, and reactive to light.  Cardiovascular:     Rate and Rhythm: Normal rate.  Pulmonary:     Effort: Pulmonary effort is normal.  Abdominal:     Palpations: Abdomen is soft.  Musculoskeletal:     Cervical back: Normal range of motion.     Right lower leg: No edema.     Left lower leg: No edema.  Skin:    General: Skin is warm.  Neurological:     Mental Status: He is alert.     Comments: Pt is alert. Oriented to name, [facility] Needed cues for month and year. Told me he lived in [place] and provided some biographical information, information about home. CN non-focal. Pt is very distracted. Constantly moving objects in front of him. Can be redirected. Motor 5/5 UE's prox to distal. LE's 4/5 prox to 5/5 distally. Does not seem to have any motor planning issues. Followed basic commands with some delay. Seems to sense pain and light touch in all 4's. DTR's 1+. No abnl resting tone.   Psychiatric:     Comments: Restless, distracted. Slight irritability but generally pleasant and cooperative.         Lab Results Last 24 Hours       Results for orders placed or performed during the hospital encounter of 05/21/23 (from the past 24  hour(s))  Comprehensive metabolic panel     Status: Abnormal    Collection Time: 05/21/23  4:14 PM  Result Value Ref Range    Sodium 131 (L) 135 - 145 mmol/L    Potassium 4.6 3.5 - 5.1 mmol/L    Chloride 97 (L) 98 - 111 mmol/L    CO2 22 22 - 32 mmol/L    Glucose, Bld 268 (H) 70 - 99 mg/dL    BUN 19 8 - 23 mg/dL    Creatinine, Ser 1.61 0.61 - 1.24 mg/dL    Calcium 9.1 8.9 - 09.6 mg/dL    Total Protein 5.9 (L) 6.5 - 8.1 g/dL    Albumin 3.0 (L) 3.5 - 5.0 g/dL    AST 27 15 - 41 U/L    ALT 36 0 - 44 U/L    Alkaline Phosphatase 75 38 - 126 U/L    Total Bilirubin 0.5 0.3 - 1.2 mg/dL    GFR, Estimated >04 >54 mL/min    Anion gap 12 5 - 15  CBC     Status: Abnormal    Collection Time: 05/21/23  4:14 PM  Result Value Ref Range    WBC 11.3 (H) 4.0 - 10.5 K/uL    RBC 4.21 (L) 4.22 - 5.81 MIL/uL    Hemoglobin 13.6 13.0 - 17.0 g/dL    HCT 09.8 11.9 - 14.7 %    MCV 97.1 80.0 - 100.0 fL    MCH 32.3 26.0 -  34.0 pg    MCHC 33.3 30.0 - 36.0 g/dL    RDW 81.1 91.4 - 78.2 %    Platelets 185 150 - 400 K/uL    nRBC 0.0 0.0 - 0.2 %  Urinalysis, w/ Reflex to Culture (Infection Suspected) -Urine, Clean Catch     Status: Abnormal    Collection Time: 05/21/23  8:50 PM  Result Value Ref Range    Specimen Source URINE, CLEAN CATCH      Color, Urine YELLOW YELLOW    APPearance CLEAR CLEAR    Specific Gravity, Urine 1.017 1.005 - 1.030    pH 5.0 5.0 - 8.0    Glucose, UA >=500 (A) NEGATIVE mg/dL    Hgb urine dipstick NEGATIVE NEGATIVE    Bilirubin Urine NEGATIVE NEGATIVE    Ketones, ur NEGATIVE NEGATIVE mg/dL    Protein, ur NEGATIVE NEGATIVE mg/dL    Nitrite NEGATIVE NEGATIVE    Leukocytes,Ua NEGATIVE NEGATIVE    RBC / HPF 0-5 0 - 5 RBC/hpf    WBC, UA 0-5 0 - 5 WBC/hpf    Bacteria, UA RARE (A) NONE SEEN    Squamous Epithelial / HPF 0-5 0 - 5 /HPF    Mucus PRESENT    CBG monitoring, ED     Status: Abnormal    Collection Time: 05/21/23  9:41 PM  Result Value Ref Range    Glucose-Capillary 169 (H)  70 - 99 mg/dL  Glucose, capillary     Status: Abnormal    Collection Time: 05/22/23  6:06 AM  Result Value Ref Range    Glucose-Capillary 257 (H) 70 - 99 mg/dL    Comment 1 Notify RN      Comment 2 Document in Chart         Imaging Results (Last 48 hours)  CT Head Wo Contrast   Addendum Date: 05/21/2023   ADDENDUM REPORT: 05/21/2023 19:10 ADDENDUM: These results were called by telephone at the time of interpretation on 05/21/2023 at 6:17 pm to provider Dr. Criss Alvine, Who verbally acknowledged these results. Electronically Signed   By: Jackey Loge D.O.   On: 05/21/2023 19:10    Result Date: 05/21/2023 CLINICAL DATA:  Mental status change, unknown cause. EXAM: CT HEAD WITHOUT CONTRAST TECHNIQUE: Contiguous axial images were obtained from the base of the skull through the vertex without intravenous contrast. RADIATION DOSE REDUCTION: This exam was performed according to the departmental dose-optimization program which includes automated exposure control, adjustment of the mA and/or kV according to patient size and/or use of iterative reconstruction technique. COMPARISON:  Prior brain MRI examinations 05/09/2023 and earlier. FINDINGS: Brain: Interval evolution of postoperative changes from recent prior frontal mass resection. Persistent epidural collection deep to the bifrontal bone flap containing pneumocephalus, fluid and postoperative blood products. This collection has increased in size from the prior brain MRI of 05/09/2023, now measuring up to 2.8 cm in thickness (previously 2.3 cm when remeasured on prior). Mass effect upon the underlying frontal lobes (left greater than right) There are foci of parenchymal hemorrhage within the anterior frontal lobes bilaterally (measuring up to 10 mm), which were not definitively present on the prior brain MRI of 05/09/2023 (for instance as seen on series 5, image 51) (series 4, image 21) Parenchymal edema within the anterior frontal lobes. On the left, this is  similar to the prior brain MRI of 05/09/2023. The edema within the anterior right frontal lobe is new from the prior MRI. Persistent effacement of the frontal horn of the left lateral  ventricle. New effacement of the frontal horn of the right lateral ventricle. No extra-axial fluid collection. No midline shift. Vascular: No hyperdense vessel. Skull: Bifrontal cranioplasty with overlying scalp staples. Sinuses/Orbits: No mass or acute finding within the imaged orbits. Minimal mucosal thickening versus small mucous retention cyst within the left maxillary sinus. Near complete opacification of the left frontal sinus. IMPRESSION: 1. Interval evolution of postoperative changes from recent prior frontal mass resection. 2. An epidural collection deep to the bifrontal cranioplasty has increased in size since the brain MRI of 05/09/2023, now measuring up to 2.8 cm in thickness (previously 2.3 cm). Mass effect on the underlying frontal lobes (left greater than right). Underlying parenchymal edema within the anterior left frontal lobe is similar to the prior MRI. Underlying parenchymal edema within the anterior right frontal lobe is new. Persistent partial effacement of the frontal horn of the left lateral ventricle. Partial effacement of the frontal horn of the right lateral ventricle, new. 3. There are foci of parenchymal hemorrhage (measuring up to 10 mm) within the anterior frontal lobes, bilaterally. These foci were not definitively present on the prior MRI. 4. Complete opacification of the left frontal sinus, similar to the prior exam. Electronically Signed: By: Jackey Loge D.O. 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 It appears that he has made some progress with increase of decadron.  Does the need for close, 24  hr/day medical supervision in concert with the patient's rehab needs make it unreasonable for this patient to be served in a less intensive setting? Yes and Potentially Co-Morbidities requiring supervision/potential complications:  -Dementia/behavioral issues -diabetes type II -post-op, steroid mgt -pain control   Due to bladder management, bowel management, safety, skin/wound care, disease management, medication administration, pain management, and patient education, does the patient require 24 hr/day rehab nursing? Yes and Potentially Does the patient require coordinated care of a physician, rehab nurse, therapy disciplines of PT, OT, SLP to address physical and functional deficits in the context of the above medical diagnosis(es)? Yes and Potentially Addressing deficits in the following areas: balance, endurance, locomotion, strength, transferring, bowel/bladder control, bathing, dressing, feeding, grooming, toileting, cognition, and psychosocial support Can the patient actively participate in an intensive therapy program of at least 3 hrs of therapy per day at least 5 days per week? Yes and Potentially The potential for patient to make measurable gains while on inpatient rehab is good Anticipated functional outcomes upon discharge from inpatient rehab are supervision  with PT, supervision with OT, supervision and min assist with SLP. Estimated rehab length of stay to reach the above functional goals is: TBD Anticipated discharge destination: Home Overall Rehab/Functional Prognosis: good   POST ACUTE RECOMMENDATIONS: This patient's condition is appropriate for continued rehabilitative care in the following setting:  potentially inpatient rehab (see below) Patient has agreed to participate in recommended program. N/A Note that insurance prior authorization may be required for reimbursement for recommended care.   Comment: PT evaluation pending. It does appear he has made some progress  regarding his behavior. However, even today, he remains very distracted and slightly agitated which will impact his ability to safely move within his home. Might benefit from a brief inpatient rehab stay to address his cognition/behavior in the context of his mobility and self-care.  Rehab Admissions Coordinator to follow up  MEDICAL RECOMMENDATIONS: Might benefit from mood stabilizing medication, but I'm not inclined to add anything as he's showing interval improvement from yesterday with steroids. Consider stimulant trial to improve concentration. Again, would observe only for now.      I have personally performed a face to face diagnostic evaluation of this patient. Additionally, I have examined the patient's medical record including any pertinent labs and radiographic images. If the physician assistant has documented in this note, I have reviewed and edited or otherwise concur with the physician assistant's documentation.   Thanks,   Ranelle Oyster, MD 05/22/2023

## 2023-05-25 NOTE — Progress Notes (Signed)
Occupational Therapy Treatment Patient Details Name: [name] [name] MRN: [id number] DOB: Jul 18, 1946 Today's Date: 05/25/2023   History of present illness Pt is 77 year old presented [facility] on  05/21/23 for AMS and worsening behavior. 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 Possibly frontal lobe edema and pt started on steroids.  PMH - CAD, colon cancer, DM, HTN, MI, PVC, ACDF, CABG x 3.   OT comments  Pt with minimal progress towards OT goals today. Pt was able to demonstrate some improvements in attention and problem solving to scan menu, locate items and problem solve how to order food. However, despite various approaches, pt would not initiate EOB/OOB ADLs or mobility with OT during session. Wife entering and attempting to encourage pt as well. Pending consistent participation, pt may still benefit and progress with intensive rehab services.   Recommendations for follow up therapy are one component of a multi-disciplinary discharge planning process, led by the attending physician.  Recommendations may be updated based on patient status, additional functional criteria and insurance authorization.    Assistance Recommended at Discharge Intermittent Supervision/Assistance  Patient can return home with the following  Assist for transportation;Direct supervision/assist for medications management;Direct supervision/assist for financial management;Assistance with cooking/housework;A little help with bathing/dressing/bathroom   Equipment Recommendations  Other (comment) (TBD)    Recommendations for Other Services Rehab consult    Precautions / Restrictions Precautions Precautions: Fall;Other (comment) Precaution Comments: recent issues with incontinence Restrictions Weight Bearing Restrictions: No       Mobility Bed Mobility               General bed mobility comments: did not respond to EOB prompts    Transfers                   General transfer comment: did  not respond to OOB prompts     Balance                                           ADL either performed or assessed with clinical judgement   ADL Overall ADL's : Needs assistance/impaired Eating/Feeding: Modified independent;Sitting Eating/Feeding Details (indicate cue type and reason): placing ice chips in diet coke on entry                   Lower Body Dressing Details (indicate cue type and reason): Provided fresh grip socks though pt appeared to be distracted (likely voluntarily distracted) and ultimately asking wife to assist who then donned pt's socks.               General ADL Comments: Pt focus on attention with menu provided to problem solve/locate food items and determine how to order food w/ assist - fair problem solving. when attempting prompts for EOB, pt w/poor initiation, attention and questionably self limiting. Pt also reporting need to go to bathroom to urinate (OT encouraged this despite condom cath d/t recent issues w/ incontinence in an effort to retrain bladder). However, unable to prompt pt to EOB/OOB    Extremity/Trunk Assessment Upper Extremity Assessment Upper Extremity Assessment: Generalized weakness   Lower Extremity Assessment Lower Extremity Assessment: Defer to PT evaluation        Vision   Vision Assessment?: No apparent visual deficits   Perception     Praxis      Cognition Arousal/Alertness: Awake/alert Behavior During Therapy: Flat affect  Overall Cognitive Status: Impaired/Different from baseline Area of Impairment: Following commands, Awareness, Memory, Safety/judgement, Problem solving, Attention                   Current Attention Level: Selective, Sustained Memory: Decreased short-term memory Following Commands: Follows one step commands inconsistently, Follows one step commands with increased time Safety/Judgement: Decreased awareness of deficits Awareness: Intellectual, Emergent Problem  Solving: Slow processing, Decreased initiation, Difficulty sequencing, Requires verbal cues, Requires tactile cues General Comments: pt laughing appropriately to humor though intermittent mild agitated remarks. attention [facility] for looking through menu to call, attempt to order muffin though memory deficist noted when pt later asked what was determined about the muffin though OT had educated that the computers were down. Pt also with decreased memory fo situation reporting he had been here 15 days and did not go home right after surgery. very poor initiation of attempts for OOB tasks despite OT's various approaches with even wife entering to prompt        Exercises      Shoulder Instructions       General Comments Wife entering though unable to get pt to engage    Pertinent Vitals/ Pain       Pain Assessment Pain Assessment: No/denies pain  Home Living                                          Prior Functioning/Environment              Frequency  Min 2X/week        Progress Toward Goals  OT Goals(current goals can now be found in the care plan section)  Progress towards OT goals: Not progressing toward goals - comment  Acute Rehab OT Goals Patient Stated Goal: none stated today OT Goal Formulation: With patient Time For Goal Achievement: 06/05/23 Potential to Achieve Goals: Good ADL Goals Pt Will Perform Grooming: with modified independence;standing Pt Will Perform Lower Body Dressing: with modified independence;sit to/from stand Pt Will Transfer to Toilet: with modified independence;ambulating Pt Will Perform Toileting - Clothing Manipulation and hygiene: with modified independence;sit to/from stand Pt Will Perform Tub/Shower Transfer: Tub transfer;with modified independence;ambulating Additional ADL Goal #1: Pt to complete 3 step trail making task with no more than min verbal cues Additional ADL Goal #2: Pt to increase attention > 5 min with no more  than min verbal cues to complete appropriate ADLs  Plan Discharge plan remains appropriate    Co-evaluation                 AM-PAC OT "6 Clicks" Daily Activity     Outcome Measure   Help from another person eating meals?: A Little Help from another person taking care of personal grooming?: A Little Help from another person toileting, which includes using toliet, bedpan, or urinal?: A Lot Help from another person bathing (including washing, rinsing, drying)?: A Lot Help from another person to put on and taking off regular upper body clothing?: A Little Help from another person to put on and taking off regular lower body clothing?: A Lot 6 Click Score: 15    End of Session    OT Visit Diagnosis: Other abnormalities of gait and mobility (R26.89);Other symptoms and signs involving cognitive function;Muscle weakness (generalized) (M62.81)   Activity Tolerance Other (comment) (limited by cognition?)   Patient Left in bed;with call bell/phone within  reach;with bed alarm set;with family/visitor present;Other (comment) (MD entering)   Nurse Communication Mobility status        Time: 5409-8119 OT Time Calculation (min): 31 min  Charges: OT General Charges $OT Visit: 1 Visit OT Treatments $Self Care/Home Management : 8-22 mins $Therapeutic Activity: 8-22 mins  Bradd Canary, OTR/L Acute Rehab Services Office: [phone]   Lorre Munroe 05/25/2023, 11:08 AM

## 2023-05-25 NOTE — Progress Notes (Signed)
Patient arrieved at 545 from 62W. Patient is alert with confusion. Denies pain or discomfort at this time. Bilateral scattered bruising to arms.ABD soft, bowel sounds active x 4. Positive pedal pulses, warm to touch, cap refil less than 3 seconds. Understands the use of the call light.  Patients wife states she will be here in the morning.

## 2023-05-25 NOTE — Progress Notes (Signed)
PMR Admission Coordinator Pre-Admission Assessment   Patient: [name] [name] is an 77 y.o., male MRN: [id number] DOB: 12-24-1945 Height: 5\' 6"  (167.6 cm) Weight: 82.9 kg (bed weight)                                                                                                                                                  Insurance Information HMO:     PPO: yes     PCP:      IPA:      80/20:      OTHER:  PRIMARY: BCBS Medicare      Policy#: [id number]      Subscriber: pt CM Name: Donnella Sham      Phone#: [phone]     Fax#: [phone] Pre-Cert#: [id number] on admit.  Approved via fast appeal for admit 7/18 with updates due to fax listed above on 7/25     Employer:  Benefits:  Phone #: [phone]     Name:  Eff. Date: 11/06/22     Deduct: $0      Out of Pocket Max: $3150 (met $1128.28)      Life Max: n/a  CIR: $335/day for days 1-5      SNF: 20 full days Outpatient:      Co-Pay: $10/visit Home Health: 100%      Co-Pay:  DME: 80%     Co-Pay: 20% Providers:  SECONDARY:       Policy#:       Phone#:    Artist:       Phone#:    The Engineer, materials Information Summary" for patients in Inpatient Rehabilitation Facilities with attached "Privacy Act Statement-Health Care Records" was provided and verbally reviewed with: Patient and Family   Emergency Contact Information Contact Information   None on File      Other Contacts       Name Relation Home Work Mobile    Sam Rayburn Spouse [phone]   [phone]    Pattricia Boss     [phone]         Current Medical History  Patient Admitting Diagnosis: meningioma s/p bifrontal crani for resection    History of Present Illness: Pt is a 77 y/o male with PMH of CAD, colon cancer, DM, HTN, MI, ACDF, and CABG x3 who recently was admitted for a bifrontal crani to resect tumor on 7/2 and discharged home, and re-admitted to [facility] on 05/21/23 with AMS and worsening behavior. In ED labs notable for Na 131, glucose 268, WBC  11.3, albumin 3.0, CT head reassuring.  Pt afebrile and no other signs of infection.  Neuro exam reassuring.  Neurosurgery felt likely cause was frontal lobe edema following tumor resection and he was started on a slow steroid taper.  Therapy evaluations completed and pt was recommended for CIR.   Glasgow Coma Scale Score: 15  Patient's medical record from [facility] has been reviewed by the rehabilitation admission coordinator and physician.   Past Medical History      Past Medical History:  Diagnosis Date   Bladder infection      10th grade   CAD (coronary artery disease)      s/p 3V CABG 2001   Cervical spondylosis 2002    History of   Colon cancer (HCC)     Diabetes mellitus (HCC)     Dysphagia     GERD (gastroesophageal reflux disease)     Heart murmur      pt has had echo   HTN (hypertension)     Hyperlipidemia     Hypothyroidism     LVH (left ventricular hypertrophy) 2015    Mild   Myocardial infarction (HCC)      x2   PVC (premature ventricular contraction)      a. s/p ablation   Tobacco abuse, in remission            Has the patient had major surgery during 100 days prior to admission? Yes   Family History  family history includes Diabetes in his father and mother; Heart attack (age of onset: 19) in his father; Heart attack (age of onset: 54) in his paternal grandfather.     Current Medications   Current Medications    Current Facility-Administered Medications:    0.9 %  sodium chloride infusion, 250 mL, Intravenous, PRN, Julio Sicks, MD   acetaminophen (TYLENOL) tablet 650 mg, 650 mg, Oral, Q6H PRN **OR** acetaminophen (TYLENOL) suppository 650 mg, 650 mg, Rectal, Q6H PRN, Pool, Sherilyn Cooter, MD   aspirin EC tablet 81 mg, 81 mg, Oral, Daily, Pool, Sherilyn Cooter, MD, 81 mg at 05/25/23 4540   atorvastatin (LIPITOR) tablet 10 mg, 10 mg, Oral, QHS, Pool, Sherilyn Cooter, MD, 10 mg at 05/24/23 2128   cholecalciferol (VITAMIN D3) 25 MCG (1000 UNIT) tablet 2,000 Units, 2,000 Units, Oral,  Daily, Julio Sicks, MD, 2,000 Units at 05/25/23 9811   cyanocobalamin (VITAMIN B12) tablet 1,000 mcg, 1,000 mcg, Oral, Daily, Pool, Sherilyn Cooter, MD, 1,000 mcg at 05/25/23 0926   dexamethasone (DECADRON) tablet 2 mg, 2 mg, Oral, Q12H, Pool, Sherilyn Cooter, MD, 2 mg at 05/25/23 9147   docusate sodium (COLACE) capsule 100 mg, 100 mg, Oral, BID, Pool, Sherilyn Cooter, MD, 100 mg at 05/25/23 0940   feeding supplement (ENSURE ENLIVE / ENSURE PLUS) liquid 237 mL, 237 mL, Oral, BID BM, Julio Sicks, MD, 237 mL at 05/25/23 0942   fesoterodine (TOVIAZ) tablet 4 mg, 4 mg, Oral, Daily, Pool, Sherilyn Cooter, MD, 4 mg at 05/25/23 8295   HYDROcodone-acetaminophen (NORCO/VICODIN) 5-325 MG per tablet 1-2 tablet, 1-2 tablet, Oral, Q4H PRN, Julio Sicks, MD, 2 tablet at 05/21/23 2100   insulin aspart (novoLOG) injection 0-20 Units, 0-20 Units, Subcutaneous, TID WC, Pool, Sherilyn Cooter, MD, 7 Units at 05/25/23 1300   insulin glargine-yfgn (SEMGLEE) injection 60 Units, 60 Units, Subcutaneous, Daily, Reome, Earle J, RPH, 60 Units at 05/25/23 0936   levETIRAcetam (KEPPRA) tablet 500 mg, 500 mg, Oral, Q12H, Pool, Sherilyn Cooter, MD, 500 mg at 05/25/23 6213   levothyroxine (SYNTHROID) tablet 75 mcg, 75 mcg, Oral, q morning, Pool, Sherilyn Cooter, MD, 75 mcg at 05/25/23 0635   memantine (NAMENDA) tablet 5 mg, 5 mg, Oral, BID, Julio Sicks, MD, 5 mg at 05/25/23 0865   metFORMIN (GLUCOPHAGE) tablet 1,000 mg, 1,000 mg, Oral, BID WC, Pool, Sherilyn Cooter, MD, 1,000 mg at 05/25/23 0932   metoprolol tartrate (LOPRESSOR) tablet 25 mg, 25 mg,  Oral, [id number], Julio Sicks, MD, 25 mg at 05/25/23 1324   multivitamin with minerals tablet 1 tablet, 1 tablet, Oral, Daily, Julio Sicks, MD, 1 tablet at 05/25/23 0927   nitroGLYCERIN (NITROSTAT) SL tablet 0.4 mg, 0.4 mg, Sublingual, Q5 min PRN, Julio Sicks, MD   ondansetron [facility]) tablet 4 mg, 4 mg, Oral, Q6H PRN **OR** ondansetron (ZOFRAN) injection 4 mg, 4 mg, Intravenous, Q6H PRN, Pool, Sherilyn Cooter, MD   pantoprazole (PROTONIX) EC tablet 40 mg, 40 mg, Oral, Daily,  Pool, Sherilyn Cooter, MD, 40 mg at 05/25/23 4010   sodium chloride flush (NS) 0.9 % injection 3 mL, 3 mL, Intravenous, Q12H, Pool, Sherilyn Cooter, MD, 3 mL at 05/22/23 0819   sodium chloride flush (NS) 0.9 % injection 3 mL, 3 mL, Intravenous, PRN, Julio Sicks, MD     Patients Current Diet:  Diet Order                  Diet Carb Modified Fluid consistency: Thin; Room service appropriate? Yes with Assist  Diet effective now                         Precautions / Restrictions Precautions Precautions: Fall, Other (comment) Precaution Comments: recent issues with incontinence Restrictions Weight Bearing Restrictions: No    Has the patient had 2 or more falls or a fall with injury in the past year?Yes   Prior Activity Level Limited Community (1-2x/wk): cognitive decline recently, mod I with mobility and occasional assist with ADLs, full assist for iADLs   Prior Functional Level Prior Function Prior Level of Function : Needs assist, Patient poor historian/Family not available  Cognitive Assist : Mobility (cognitive), ADLs (cognitive) Mobility (Cognitive): Intermittent cues Physical Assist : Mobility (physical), ADLs (physical) Mobility (physical): Transfers, Gait Mobility Comments: During recent admission pt requiring min guard to min assist for ambulation without assistive device. prior to surgery, no AD needed and pt moving fairly well ADLs Comments: Pt reports independence with ADLs though per chart, recent difficulty since DC s/p sx. Per prior admission, spouse assisting with IADLs though pt reports able to cook at home usually (?). pt also reports going hunting   Self Care: Did the patient need help bathing, dressing, using the toilet or eating?  Needed some help   Indoor Mobility: Did the patient need assistance with walking from room to room (with or without device)? Independent   Stairs: Did the patient need assistance with internal or external stairs (with or without device)? Needed some  help   Functional Cognition: Did the patient need help planning regular tasks such as shopping or remembering to take medications? Dependent   Patient Information Are you of Hispanic, Latino/a,or Spanish origin?: A. No, not of Hispanic, Latino/a, or Spanish origin, X. Patient unable to respond (proxy) What is your race?: A. White, X. Patient unable to respond (proxy) Do you need or want an interpreter to communicate with a doctor or health care staff?: 9. Unable to respond   Patient's Response To:  Health Literacy and Transportation Is the patient able to respond to health literacy and transportation needs?: No   Home Assistive Devices / Equipment Home Assistive Devices/Equipment: Eyeglasses, Dentures (specify type) Home Equipment: Agricultural consultant (2 wheels), Shower seat - built in, [place] - single point, Grab bars - tub/shower, BSC/3in1   Prior Device Use: Indicate devices/aids used by the patient prior to current illness, exacerbation or injury? None of the above   Current Functional Level Cognition  Overall Cognitive Status: Impaired/Different from baseline Current Attention Level: Selective, Sustained Orientation Level: Oriented X4 Following Commands: Follows one step commands inconsistently, Follows one step commands with increased time Safety/Judgement: Decreased awareness of deficits General Comments: pt laughing appropriately to humor though intermittent mild agitated remarks. attention [facility] for looking through menu to call, attempt to order muffin though memory deficist noted when pt later asked what was determined about the muffin though OT had educated that the computers were down. Pt also with decreased memory fo situation reporting he had been here 15 days and did not go home right after surgery. very poor initiation of attempts for OOB tasks despite OT's various approaches with even wife entering to prompt    Extremity Assessment (includes Sensation/Coordination)   Upper  Extremity Assessment: Generalized weakness  Lower Extremity Assessment: Defer to PT evaluation     ADLs   Overall ADL's : Needs assistance/impaired Eating/Feeding: Modified independent, Sitting Eating/Feeding Details (indicate cue type and reason): placing ice chips in diet coke on entry Grooming: Min guard, Standing, Minimal assistance, Wash/dry face, Moderate assistance Grooming Details (indicate cue type and reason): min guard for balance but variable cognitive assist/cues for attention to tasks, completion of tasks. pt unscrewing deodorant cap, attempting to go under gait belt to underarm. when OT moved gait belt, pt had reported no need for deodorant and began wiping sink with washcloth Upper Body Bathing: Minimal assistance, Sitting Lower Body Bathing: Moderate assistance, Sitting/lateral leans, Sit to/from stand Upper Body Dressing : Minimal assistance Lower Body Dressing: Moderate assistance Lower Body Dressing Details (indicate cue type and reason): Provided fresh grip socks though pt appeared to be distracted (likely voluntarily distracted) and ultimately asking wife to assist who then donned pt's socks. Toilet Transfer: Min guard, Ambulation, Rolling walker (2 wheels) Toileting- Clothing Manipulation and Hygiene: Moderate assistance, Sit to/from stand, Sitting/lateral lean Functional mobility during ADLs: Min guard, Rolling walker (2 wheels), Cueing for safety, Cueing for sequencing General ADL Comments: Pt focus on attention with menu provided to problem solve/locate food items and determine how to order food w/ assist - fair problem solving. when attempting prompts for EOB, pt w/poor initiation, attention and questionably self limiting. Pt also reporting need to go to bathroom to urinate (OT encouraged this despite condom cath d/t recent issues w/ incontinence in an effort to retrain bladder). However, unable to prompt pt to EOB/OOB     Mobility   Overal bed mobility: Needs  Assistance Bed Mobility: Supine to Sit Supine to sit: Min assist, HOB elevated General bed mobility comments: did not respond to EOB prompts     Transfers   Overall transfer level: Needs assistance Equipment used: Rolling walker (2 wheels) Transfers: Sit to/from Stand Sit to Stand: Min guard General transfer comment: did not respond to OOB prompts     Ambulation / Gait / Stairs / Wheelchair Mobility   Ambulation/Gait Ambulation/Gait assistance: Editor, commissioning (Feet): 160 Feet Assistive device: 1 person hand held assist, Rolling walker (2 wheels) Gait Pattern/deviations: Step-through pattern, Decreased stride length General Gait Details: Assist for balance throughout due to unsteady gait. Walker improved stability somewhat. Gait velocity: decr Gait velocity interpretation: 1.31 - 2.62 ft/sec, indicative of limited community ambulator     Posture / Balance Balance Overall balance assessment: Needs assistance Sitting-balance support: No upper extremity supported, Feet supported Sitting balance-Leahy Scale: Good Standing balance support: No upper extremity supported, During functional activity, Single extremity supported Standing balance-Leahy Scale: Fair Standing balance comment: able to stand at  sink with min guard     Special needs/care consideration Skin large surgical incision to forehead and Diabetic management yes        Previous Home Environment (from acute therapy documentation) Living Arrangements: Spouse/significant other Available Help at Discharge: Family Type of Home: House Home Layout: One level Home Access: Stairs to enter Entrance Stairs-Rails: Left Entrance Stairs-Number of Steps: 3 Bathroom Shower/Tub: Health visitor: Handicapped height Bathroom Accessibility: Yes Home Care Services: Yes Type of Home Care Services: Home PT   Discharge Living Setting Plans for Discharge Living Setting: Patient's home, Lives with (comment)  (spouse) Type of Home at Discharge: House Discharge Home Layout: One level Discharge Home Access: Stairs to enter Entrance Stairs-Rails: Left Entrance Stairs-Number of Steps: 23 Discharge Bathroom Shower/Tub: Walk-in shower Discharge Bathroom Toilet: Handicapped height Discharge Bathroom Accessibility: Yes How Accessible: Accessible via walker Does the patient have any problems obtaining your medications?: No   Social/Family/Support Systems Patient Roles: Spouse Anticipated Caregiver: Azad Calame (spouse) Anticipated Caregiver's Contact Information: [phone] Ability/Limitations of Caregiver: supervision for mobility/ADLs Caregiver Availability: 24/7 Discharge Plan Discussed with Primary Caregiver: Yes Is Caregiver In Agreement with Plan?: Yes Does Caregiver/Family have Issues with Lodging/Transportation while Pt is in Rehab?: No     Goals Patient/Family Goal for Rehab: PT/OT supervision, SLP supervision-min A Expected length of stay: 6-9 days Additional Information: Discharge plan: return home with spouse providing 24/7 supervision Pt/Family Agrees to Admission and willing to participate: Yes Program Orientation Provided & Reviewed with Pt/Caregiver Including Roles  & Responsibilities: Yes  Barriers to Discharge: Insurance for SNF coverage     Decrease burden of Care through IP rehab admission:  n/a     Possible need for SNF placement upon discharge: Not anticipated.  Plan for d/c home with spouse and 24/7 supervision from her.      Patient Condition: This patient's condition remains as documented in the consult dated 7/16, in which the Rehabilitation Physician determined and documented that the patient's condition is appropriate for intensive rehabilitative care in an inpatient rehabilitation facility. Will admit to inpatient rehab today.   Preadmission Screen Completed By:  Stephania Fragmin, PT, DPT 05/25/2023 2:28  PM ______________________________________________________________________   Discussed status with Dr. Riley Kill on 05/25/23 at 2:28 PM  and received approval for admission today.   Admission Coordinator:  Stephania Fragmin, PT, DPT time 2:28 PM Dorna Bloom 05/25/23

## 2023-05-25 NOTE — Plan of Care (Signed)
  Problem: Education: Goal: Knowledge of the prescribed therapeutic regimen will improve Outcome: Progressing   Problem: Clinical Measurements: Goal: Neurologic status will improve Outcome: Progressing   Problem: Fluid Volume: Goal: Ability to maintain a balanced intake and output will improve Outcome: Progressing   Problem: Health Behavior/Discharge Planning: Goal: Ability to manage health-related needs will improve Outcome: Progressing   Problem: Skin Integrity: Goal: Risk for impaired skin integrity will decrease Outcome: Progressing

## 2023-05-25 NOTE — H&P (Signed)
Physical Medicine and Rehabilitation Admission H&P    Chief Complaint  Patient presents with   Altered Mental Status  : HPI: [name] [name] is a 77 Y/O male with history of with H/O CAD/CABG HTN DM HLD as well as colon cancer and bifrontal craniotomy resection for meningioma 2 years ago.Lives with spouse Independent PTA Wife does assist withADLS.  Presented 05/08/2023 with AMS and mood changes.Ct imaging showed large bi frontal extra axial tumor with mass effect.Underwent planned stereotactic cranio recection for tumor like;y meningioma7/2/24 per Dr Jake Samples.  Decadron taper as indicated.Decadron taper as indicated. Keppra for seizure prophylaxis..Low dose aspirin resumed for H/O CAD.Tolerating a regular dirt Therapy eval completed and due to limited mobility was admitted for a comprehensive rehab program.    Review of Systems  Constitutional:  Positive for malaise/fatigue. Negative for chills and fever.  HENT:  Negative for hearing loss.   Eyes:  Negative for blurred vision and double vision.  Respiratory:  Negative for cough, shortness of breath and wheezing.   Cardiovascular:  Negative for chest pain, palpitations and leg swelling.  Gastrointestinal:        GERD  Genitourinary:  Negative for dysuria, flank pain and hematuria.  Musculoskeletal:  Positive for joint pain and myalgias.  Skin:  Negative for rash.  Neurological:  Positive for weakness.  Psychiatric/Behavioral:  Positive for memory loss.   All other systems reviewed and are negative.  Past Medical History:  Diagnosis Date   Bladder infection    10th grade   CAD (coronary artery disease)    s/p 3V CABG 2001   Cervical spondylosis 2002   History of   Colon cancer (HCC)    Diabetes mellitus (HCC)    Dysphagia    GERD (gastroesophageal reflux disease)    Heart murmur    pt has had echo   HTN (hypertension)    Hyperlipidemia    Hypothyroidism    LVH (left ventricular hypertrophy) 2015   Mild   Myocardial infarction  (HCC)    x2   PVC (premature ventricular contraction)    a. s/p ablation   Tobacco abuse, in remission    Past Surgical History:  Procedure Laterality Date   ANTERIOR CERVICAL DISCECTOMY  03/20/2001   Many levels.  Dr Franky Macho   APPLICATION OF CRANIAL NAVIGATION N/A 05/08/2023   Procedure: APPLICATION OF CRANIAL NAVIGATION;  Surgeon: Dawley, Alan Mulder, DO;  Location: [facility] OR;  Service: Neurosurgery;  Laterality: N/A;   Bone spur removal right shoulder     CHOLECYSTECTOMY     CORONARY ARTERY BYPASS GRAFT  10/23/2000   x3 Dr Tyrone Sage   CRANIOTOMY N/A 05/08/2023   Procedure: BIFRONTAL CRANIOTOMY TUMOR EXCISION;  Surgeon: Bethann Goo, DO;  Location: [facility] OR;  Service: Neurosurgery;  Laterality: N/A;   FACIAL FRACTURE SURGERY     after being hit with baseball   MOUTH SURGERY     Pineal cyst removal     PROCTOSCOPY N/A 09/19/2018   Procedure: RIGID PROCTOSCOPY;  Surgeon: Karie Soda, MD;  Location: [facility]S;  Service: General;  Laterality: N/A;   V-TACH ABLATION N/A 02/15/2015   PVC ablation by Dr Ladona Ridgel   Family History  Problem Relation Age of Onset   Heart attack Father 3   Diabetes Father    Diabetes Mother    Heart attack Paternal Grandfather 75   Social History:  reports that he quit smoking about 22 years ago. His smoking use included cigarettes. He started smoking about 57 years  ago. He has a 105 pack-year smoking history. He has never used smokeless tobacco. He reports that he does not drink alcohol and does not use drugs. Allergies: No Known Allergies Medications Prior to Admission  Medication Sig Dispense Refill   aspirin 81 MG tablet Take 1 tablet (81 mg total) by mouth daily. 30 tablet 0   atorvastatin (LIPITOR) 10 MG tablet Take 10 mg by mouth at bedtime.   0   Cholecalciferol (VITAMIN D3) 50 MCG (2000 UT) TABS Take 2,000 Units by mouth daily.     HYDROcodone-acetaminophen (NORCO/VICODIN) 5-325 MG tablet Take 1 tablet by mouth every 4 (four) hours as needed for moderate pain.  30 tablet 0   insulin glargine (LANTUS) 100 UNIT/ML Solostar Pen Inject 60 Units into the skin daily.     levothyroxine (SYNTHROID) 75 MCG tablet Take 75 mcg by mouth every morning.     memantine (NAMENDA) 5 MG tablet Take 1 tablet (5 mg at night) for 2 weeks, then increase to 1 tablet (5 mg) twice a day 60 tablet 11   metFORMIN (GLUCOPHAGE) 1000 MG tablet Take 1,000 mg by mouth 2 (two) times daily.  3   metoprolol tartrate (LOPRESSOR) 25 MG tablet Take 25 mg by mouth daily at 6 (six) AM.     nitroGLYCERIN (NITROSTAT) 0.4 MG SL tablet Place 1 tablet (0.4 mg total) under the tongue every 5 (five) minutes as needed for chest pain. 25 tablet 6   ondansetron (ZOFRAN) 4 MG tablet Take 1 tablet (4 mg total) by mouth every 6 (six) hours as needed for nausea. 20 tablet 0   pantoprazole (PROTONIX) 40 MG tablet Take 1 tablet (40 mg total) by mouth 2 (two) times daily before a meal. 60 tablet 0   polyethylene glycol (MIRALAX / GLYCOLAX) 17 g packet Take 17 g by mouth daily as needed for mild constipation. 14 each 0   solifenacin (VESICARE) 10 MG tablet Take 10 mg by mouth daily.     acetaminophen (TYLENOL) 325 MG tablet Take 2 tablets (650 mg total) by mouth every 6 (six) hours as needed for mild pain (or Fever >/= 101). (Patient not taking: Reported on 05/21/2023) 20 tablet 0   [EXPIRED] dexamethasone (DECADRON) 1 MG tablet Take 2 tablets (2 mg total) by mouth with breakfast, with lunch, and with evening meal for 3 days, THEN 2 tablets (2 mg total) 2 (two) times daily with a meal for 3 days, THEN 1 tablet (1 mg total) 2 (two) times daily with a meal for 3 days, THEN 1 tablet (1 mg total) daily with breakfast for 3 days. (Patient not taking: Reported on 05/21/2023) 39 tablet 0   levETIRAcetam (KEPPRA) 500 MG tablet Take 1 tablet (500 mg total) by mouth every 12 (twelve) hours for 14 days. (Patient not taking: Reported on 05/21/2023) 28 tablet 0      Home: Home Living Family/patient expects to be discharged  to:: Private residence Living Arrangements: Spouse/significant other Available Help at Discharge: Family Type of Home: House Home Access: Stairs to enter Secretary/administrator of Steps: 3 Entrance Stairs-Rails: Left Home Layout: One level Bathroom Shower/Tub: Health visitor: Handicapped height Bathroom Accessibility: Yes Home Equipment: Agricultural consultant (2 wheels), Information systems manager - built in, [place] - single point, Hydrologist, BSC/3in1   Functional History: Prior Function Prior Level of Function : Needs assist, Patient poor historian/Family not available  Cognitive Assist : Mobility (cognitive), ADLs (cognitive) Mobility (Cognitive): Intermittent cues Physical Assist : Mobility (physical),  ADLs (physical) Mobility (physical): Transfers, Gait Mobility Comments: During recent admission pt requiring min guard to min assist for ambulation without assistive device. prior to surgery, no AD needed and pt moving fairly well ADLs Comments: Pt reports independence with ADLs though per chart, recent difficulty since DC s/p sx. Per prior admission, spouse assisting with IADLs though pt reports able to cook at home usually (?). pt also reports going hunting  Functional Status:  Mobility: Bed Mobility Overal bed mobility: Needs Assistance Bed Mobility: Supine to Sit Supine to sit: Min assist, HOB elevated General bed mobility comments: did not respond to EOB prompts Transfers Overall transfer level: Needs assistance Equipment used: Rolling walker (2 wheels) Transfers: Sit to/from Stand Sit to Stand: Min guard General transfer comment: did not respond to OOB prompts Ambulation/Gait Ambulation/Gait assistance: Min assist Gait Distance (Feet): 160 Feet Assistive device: 1 person hand held assist, Rolling walker (2 wheels) Gait Pattern/deviations: Step-through pattern, Decreased stride length General Gait Details: Assist for balance throughout due to unsteady gait. Walker  improved stability somewhat. Gait velocity: decr Gait velocity interpretation: 1.31 - 2.62 ft/sec, indicative of limited community ambulator    ADL: ADL Overall ADL's : Needs assistance/impaired Eating/Feeding: Modified independent, Sitting Eating/Feeding Details (indicate cue type and reason): placing ice chips in diet coke on entry Grooming: Min guard, Standing, Minimal assistance, Wash/dry face, Moderate assistance Grooming Details (indicate cue type and reason): min guard for balance but variable cognitive assist/cues for attention to tasks, completion of tasks. pt unscrewing deodorant cap, attempting to go under gait belt to underarm. when OT moved gait belt, pt had reported no need for deodorant and began wiping sink with washcloth Upper Body Bathing: Minimal assistance, Sitting Lower Body Bathing: Moderate assistance, Sitting/lateral leans, Sit to/from stand Upper Body Dressing : Minimal assistance Lower Body Dressing: Moderate assistance Lower Body Dressing Details (indicate cue type and reason): Provided fresh grip socks though pt appeared to be distracted (likely voluntarily distracted) and ultimately asking wife to assist who then donned pt's socks. Toilet Transfer: Min guard, Ambulation, Rolling walker (2 wheels) Toileting- Clothing Manipulation and Hygiene: Moderate assistance, Sit to/from stand, Sitting/lateral lean Functional mobility during ADLs: Min guard, Rolling walker (2 wheels), Cueing for safety, Cueing for sequencing General ADL Comments: Pt focus on attention with menu provided to problem solve/locate food items and determine how to order food w/ assist - fair problem solving. when attempting prompts for EOB, pt w/poor initiation, attention and questionably self limiting. Pt also reporting need to go to bathroom to urinate (OT encouraged this despite condom cath d/t recent issues w/ incontinence in an effort to retrain bladder). However, unable to prompt pt to  EOB/OOB  Cognition: Cognition Overall Cognitive Status: Impaired/Different from baseline Orientation Level: Oriented X4 Cognition Arousal/Alertness: Awake/alert Behavior During Therapy: Flat affect Overall Cognitive Status: Impaired/Different from baseline Area of Impairment: Following commands, Awareness, Memory, Safety/judgement, Problem solving, Attention Orientation Level: Time, Situation Current Attention Level: Selective, Sustained Memory: Decreased short-term memory Following Commands: Follows one step commands inconsistently, Follows one step commands with increased time Safety/Judgement: Decreased awareness of deficits Awareness: Intellectual, Emergent Problem Solving: Slow processing, Decreased initiation, Difficulty sequencing, Requires verbal cues, Requires tactile cues General Comments: pt laughing appropriately to humor though intermittent mild agitated remarks. attention [facility] for looking through menu to call, attempt to order muffin though memory deficist noted when pt later asked what was determined about the muffin though OT had educated that the computers were down. Pt also with decreased memory fo situation reporting  he had been here 15 days and did not go home right after surgery. very poor initiation of attempts for OOB tasks despite OT's various approaches with even wife entering to prompt  Physical Exam: Blood pressure 127/65, pulse 61, temperature 97.7 F (36.5 C), temperature source Oral, resp. rate 16, height 5\' 6"  (1.676 m), weight 82.9 kg, SpO2 98%. Physical Exam Constitutional:      Appearance: He is not ill-appearing.  HENT:     Head:     Comments: Long bifrontal incision, clean dry and well-approximated. Staples removed.    Right Ear: External ear normal.     Left Ear: External ear normal.     Nose: Nose normal.     Mouth/Throat:     Mouth: Mucous membranes are moist.     Pharynx: Oropharynx is clear.  Eyes:     Conjunctiva/sclera: Conjunctivae  normal.  Cardiovascular:     Rate and Rhythm: Normal rate and regular rhythm.     Heart sounds: No murmur heard.    No gallop.  Pulmonary:     Effort: Pulmonary effort is normal. No respiratory distress.     Breath sounds: No wheezing.  Abdominal:     General: Bowel sounds are normal. There is no distension.     Palpations: Abdomen is soft.     Tenderness: There is no abdominal tenderness.  Musculoskeletal:        General: No swelling or tenderness. Normal range of motion.     Cervical back: Normal range of motion.  Skin:    General: Skin is warm and dry.     Findings: No lesion or rash.  Neurological:     Mental Status: He is alert.     Comments: Pt is alert and oriented to person, hospital (when given choices), month/year. Follows basic commands, normal language, speech clear. Pt is distracted and sometimes needs cues to stay on task. Delays with processing and initiation as well. Motor: 4+/5 BUE, LE: 4-/5 HF, 4/5 KE, 5/5 ADF/PF. Sensory exam normal for light touch and pain in all 4 limbs. No limb ataxia or cerebellar signs. No abnormal tone appreciated.     Patient is alert and limited historian Follow commands with limited recall  Psychiatric:     Comments: Pt generally flat, is cooperative     Results for orders placed or performed during the hospital encounter of 05/21/23 (from the past 48 hour(s))  Glucose, capillary     Status: Abnormal   Collection Time: 05/23/23  5:19 PM  Result Value Ref Range   Glucose-Capillary 126 (H) 70 - 99 mg/dL    Comment: Glucose reference range applies only to samples taken after fasting for at least 8 hours.   Comment 1 Notify RN    Comment 2 Document in Chart   Glucose, capillary     Status: Abnormal   Collection Time: 05/23/23  9:01 PM  Result Value Ref Range   Glucose-Capillary 165 (H) 70 - 99 mg/dL    Comment: Glucose reference range applies only to samples taken after fasting for at least 8 hours.  Glucose, capillary     Status:  Abnormal   Collection Time: 05/23/23  9:17 PM  Result Value Ref Range   Glucose-Capillary 172 (H) 70 - 99 mg/dL    Comment: Glucose reference range applies only to samples taken after fasting for at least 8 hours.  Glucose, capillary     Status: None   Collection Time: 05/24/23  6:19 AM  Result  Value Ref Range   Glucose-Capillary 90 70 - 99 mg/dL    Comment: Glucose reference range applies only to samples taken after fasting for at least 8 hours.  Glucose, capillary     Status: Abnormal   Collection Time: 05/24/23 11:27 AM  Result Value Ref Range   Glucose-Capillary 143 (H) 70 - 99 mg/dL    Comment: Glucose reference range applies only to samples taken after fasting for at least 8 hours.  Glucose, capillary     Status: Abnormal   Collection Time: 05/24/23  6:00 PM  Result Value Ref Range   Glucose-Capillary 231 (H) 70 - 99 mg/dL    Comment: Glucose reference range applies only to samples taken after fasting for at least 8 hours.  Glucose, capillary     Status: Abnormal   Collection Time: 05/24/23  9:41 PM  Result Value Ref Range   Glucose-Capillary 121 (H) 70 - 99 mg/dL    Comment: Glucose reference range applies only to samples taken after fasting for at least 8 hours.  Glucose, capillary     Status: Abnormal   Collection Time: 05/25/23  6:46 AM  Result Value Ref Range   Glucose-Capillary 172 (H) 70 - 99 mg/dL    Comment: Glucose reference range applies only to samples taken after fasting for at least 8 hours.  Glucose, capillary     Status: Abnormal   Collection Time: 05/25/23 12:50 PM  Result Value Ref Range   Glucose-Capillary 213 (H) 70 - 99 mg/dL    Comment: Glucose reference range applies only to samples taken after fasting for at least 8 hours.   No results found.    Blood pressure 127/65, pulse 61, temperature 97.7 F (36.5 C), temperature source Oral, resp. rate 16, height 5\' 6"  (1.676 m), weight 82.9 kg, SpO2 98%.  Medical Problem List and Plan: 1. Functional  deficits secondary to Large bi frontal axial tumor suspect meningioma.S/O cranio resection 7/2 with H/O meningioma two years ago.Decadron taper  -patient may  shower  -ELOS/Goals: 6-9 days, supervision goals with PT, OT, sup-min assist with cognition and behavior 2.  Antithrombotics: -DVT/anticoagulation:  Mechanical: Antiembolism stockings, thigh (TED hose) Bilateral lower extremities  -antiplatelet therapy: ASA 81 mg daily 3. Pain Management: Hydrocodone as needed 4. Mood/Behavior/Sleep:    -behavior improved after steroids. However decadron being tapered -observe for increased agitation/confusion. -maximize sleep/wake cycle   -antipsychotic agents: seroquel 25mg  every day prn agitation/sleep  -trazodone 50mg  at bedtime prn   -sleep chart 5. Neuropsych/cognition: This patient is not capable of making decisions on her own behalf.  -namenda 5mg  bid per home dosing 6. Skin/Wound Care: Routine skin checks 7. Fluids/Electrolytes/Nutrition: routine lab folow up 8 Seizure prophylaxis.keppra 500 mg BID 9.Hypothyroid.Synthroid 10.DM.Semglee 60 unit daily and Glucophage 1000 mg bid  -SSI/Monitor while on decadron 11.GERD.Protonix 12.Hyperlipidemia.Lipitor 13.HTN.Lopressor 25 mg BID ` -bp controlled at present   Charlton Amor, PA-C 05/25/2023

## 2023-05-25 NOTE — Progress Notes (Signed)
Inpatient Rehab Admissions Coordinator:   I have approval for CIR admit today.  Awaiting confirmation that Dr. Jordan Likes is okay with discharging to [place] today.  Will let pt/family and TOC know.   Estill Dooms, PT, DPT Admissions Coordinator [phone] 05/25/23  2:25 PM

## 2023-05-25 NOTE — Progress Notes (Signed)
Overall stable.   Continues with postoperative frontal lobe dysfunction which is likely transient.  Awaiting rehab decision  .

## 2023-05-26 LAB — COMPREHENSIVE METABOLIC PANEL
ALT: 25 U/L (ref 0–44)
AST: 18 U/L (ref 15–41)
Albumin: 2.6 g/dL — ABNORMAL LOW (ref 3.5–5.0)
Alkaline Phosphatase: 70 U/L (ref 38–126)
Anion gap: 13 (ref 5–15)
BUN: 21 mg/dL (ref 8–23)
CO2: 27 mmol/L (ref 22–32)
Calcium: 9.3 mg/dL (ref 8.9–10.3)
Chloride: 94 mmol/L — ABNORMAL LOW (ref 98–111)
Creatinine, Ser: 1.13 mg/dL (ref 0.61–1.24)
GFR, Estimated: >60 mL/min (ref >60)
Glucose, Bld: 178 mg/dL — ABNORMAL HIGH (ref 70–99)
Potassium: 4.3 mmol/L (ref 3.5–5.1)
Sodium: 134 mmol/L — ABNORMAL LOW (ref 135–145)
Total Bilirubin: 0.7 mg/dL (ref 0.3–1.2)
Total Protein: 5.5 g/dL — ABNORMAL LOW (ref 6.5–8.1)

## 2023-05-26 LAB — CBC WITH DIFFERENTIAL/PLATELET
Abs Immature Granulocytes: 0.06 10*3/uL (ref 0.00–0.07)
Basophils Absolute: 0 10*3/uL (ref 0.0–0.1)
Basophils Relative: 0 %
Eosinophils Absolute: 0.1 10*3/uL (ref 0.0–0.5)
Eosinophils Relative: 1 %
HCT: 41.6 % (ref 39.0–52.0)
Hemoglobin: 13.8 g/dL (ref 13.0–17.0)
Immature Granulocytes: 1 %
Lymphocytes Relative: 6 %
Lymphs Abs: 0.7 10*3/uL (ref 0.7–4.0)
MCH: 33.1 pg (ref 26.0–34.0)
MCHC: 33.2 g/dL (ref 30.0–36.0)
MCV: 99.8 fL (ref 80.0–100.0)
Monocytes Absolute: 0.6 10*3/uL (ref 0.1–1.0)
Monocytes Relative: 6 %
Neutro Abs: 9.5 10*3/uL — ABNORMAL HIGH (ref 1.7–7.7)
Neutrophils Relative %: 86 %
Platelets: 179 10*3/uL (ref 150–400)
RBC: 4.17 MIL/uL — ABNORMAL LOW (ref 4.22–5.81)
RDW: 13.2 % (ref 11.5–15.5)
WBC: 10.9 10*3/uL — ABNORMAL HIGH (ref 4.0–10.5)
nRBC: 0 % (ref 0.0–0.2)

## 2023-05-26 LAB — GLUCOSE, CAPILLARY
Glucose-Capillary: 156 mg/dL — ABNORMAL HIGH (ref 70–99)
Glucose-Capillary: 183 mg/dL — ABNORMAL HIGH (ref 70–99)
Glucose-Capillary: 118 mg/dL — ABNORMAL HIGH (ref 70–99)
Glucose-Capillary: 51 mg/dL — ABNORMAL LOW (ref 70–99)
Glucose-Capillary: 108 mg/dL — ABNORMAL HIGH (ref 70–99)
Glucose-Capillary: 56 mg/dL — ABNORMAL LOW (ref 70–99)

## 2023-05-26 LAB — MAGNESIUM: Magnesium: 1.7 mg/dL (ref 1.7–2.4)

## 2023-05-26 LAB — PHOSPHORUS: Phosphorus: 2.4 mg/dL — ABNORMAL LOW (ref 2.5–4.6)
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
## 2023-05-26 NOTE — Discharge Summary (Signed)
Physician Discharge Summary  Patient ID: [name] [name] MRN: [id number] DOB/AGE: 1946/07/05 77 y.o.  Admit date: 05/21/2023 Discharge date: 05/26/2023  Admission Diagnoses:  Discharge Diagnoses:  Principal Problem:   Meningioma [facility])   Discharged Condition: fair  Hospital Course: Patient readmitted after craniotomy for right parafalcine frontal meningioma resection.  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  Symptoms improved somewhat with steroids.  Patient to be transferred to inpatient rehabilitation for further convalescence and therapy.  Consults:   Significant Diagnostic Studies:   Treatments:   Discharge Exam: Blood pressure 124/63, pulse 65, temperature 98.2 F (36.8 C), temperature source Oral, resp. rate 16, height 5\' 6"  (1.676 m), weight 82.9 kg, SpO2 97%. Patient is awake and alert.  He is oriented and reasonably appropriate.  Cranial nerve function intact.  Wound healing well.  Motor and sensory function intact.  Chest and abdomen benign  Disposition: Discharge disposition: [facility]-Another [facility] Care Institution Not Defined        Allergies as of 05/25/2023   No Known Allergies      Medication List     ASK your doctor about these medications    acetaminophen 325 MG tablet Commonly known as: TYLENOL Take 2 tablets (650 mg total) by mouth every 6 (six) hours as needed for mild pain (or Fever >/= 101).   aspirin 81 MG tablet Take 1 tablet (81 mg total) by mouth daily.   atorvastatin 10 MG tablet Commonly known as: LIPITOR Take 10 mg by mouth at bedtime.   dexamethasone 1 MG tablet Commonly known as: DECADRON Take 2 tablets (2 mg total) by mouth with breakfast, with lunch, and with evening meal for 3 days, THEN 2 tablets (2 mg total) 2 (two) times daily with a meal for 3 days, THEN 1 tablet (1 mg total) 2 (two) times daily with a meal for 3 days, THEN 1 tablet (1 mg total) daily with breakfast  for 3 days. Start taking on: May 12, 2023 Ask about: Should I take this medication?   HYDROcodone-acetaminophen 5-325 MG tablet Commonly known as: NORCO/VICODIN Take 1 tablet by mouth every 4 (four) hours as needed for moderate pain.   insulin glargine 100 UNIT/ML Solostar Pen Commonly known as: LANTUS Inject 60 Units into the skin daily.   levETIRAcetam 500 MG tablet Commonly known as: KEPPRA Take 1 tablet (500 mg total) by mouth every 12 (twelve) hours for 14 days.   levothyroxine 75 MCG tablet Commonly known as: SYNTHROID Take 75 mcg by mouth every morning.   memantine 5 MG tablet Commonly known as: NAMENDA Take 1 tablet (5 mg at night) for 2 weeks, then increase to 1 tablet (5 mg) twice a day   metFORMIN 1000 MG tablet Commonly known as: GLUCOPHAGE Take 1,000 mg by mouth 2 (two) times daily.   metoprolol tartrate 25 MG tablet Commonly known as: LOPRESSOR Take 25 mg by mouth daily at 6 (six) AM.   nitroGLYCERIN 0.4 MG SL tablet Commonly known as: NITROSTAT Place 1 tablet (0.4 mg total) under the tongue every 5 (five) minutes as needed for chest pain.   ondansetron 4 MG tablet Commonly known as: ZOFRAN Take 1 tablet (4 mg total) by mouth every 6 (six) hours as needed for nausea.   pantoprazole 40 MG tablet Commonly known as: PROTONIX Take 1 tablet (40 mg total) by mouth 2 (two) times daily before a meal.   polyethylene glycol 17 g packet Commonly known as: MIRALAX /  GLYCOLAX Take 17 g by mouth daily as needed for mild constipation.   solifenacin 10 MG tablet Commonly known as: VESICARE Take 10 mg by mouth daily.   Vitamin D3 50 MCG (2000 UT) Tabs Take 2,000 Units by mouth daily.         Signed: Kathaleen Maser Kimberly Nieland 05/26/2023, 11:50 AM

## 2023-05-26 NOTE — Plan of Care (Signed)
  Problem: RH Balance Goal: LTG Patient will maintain dynamic standing with ADLs (OT) Description: LTG:  Patient will maintain dynamic standing balance with assist during activities of daily living (OT)  Flowsheets (Taken 05/26/2023 0932) LTG: Pt will maintain dynamic standing balance during ADLs with: Independent with assistive device   Problem: Sit to Stand Goal: LTG:  Patient will perform sit to stand in prep for activites of daily living with assistance level (OT) Description: LTG:  Patient will perform sit to stand in prep for activites of daily living with assistance level (OT) Flowsheets (Taken 05/26/2023 0932) LTG: PT will perform sit to stand in prep for activites of daily living with assistance level: Independent with assistive device   Problem: RH Eating Goal: LTG Patient will perform eating w/assist, cues/equip (OT) Description: LTG: Patient will perform eating with assist, with/without cues using equipment (OT) Flowsheets (Taken 05/26/2023 0932) LTG: Pt will perform eating with assistance level of: Independent with assistive device    Problem: RH Grooming Goal: LTG Patient will perform grooming w/assist,cues/equip (OT) Description: LTG: Patient will perform grooming with assist, with/without cues using equipment (OT) Flowsheets (Taken 05/26/2023 0932) LTG: Pt will perform grooming with assistance level of: Independent with assistive device    Problem: RH Bathing Goal: LTG Patient will bathe all body parts with assist levels (OT) Description: LTG: Patient will bathe all body parts with assist levels (OT) Flowsheets (Taken 05/26/2023 0932) LTG: Pt will perform bathing with assistance level/cueing: Supervision/Verbal cueing   Problem: RH Dressing Goal: LTG Patient will perform upper body dressing (OT) Description: LTG Patient will perform upper body dressing with assist, with/without cues (OT). Flowsheets (Taken 05/26/2023 0932) LTG: Pt will perform upper body dressing with  assistance level of: Independent Goal: LTG Patient will perform lower body dressing w/assist (OT) Description: LTG: Patient will perform lower body dressing with assist, with/without cues in positioning using equipment (OT) Flowsheets (Taken 05/26/2023 0932) LTG: Pt will perform lower body dressing with assistance level of: Supervision/Verbal cueing   Problem: RH Toileting Goal: LTG Patient will perform toileting task (3/3 steps) with assistance level (OT) Description: LTG: Patient will perform toileting task (3/3 steps) with assistance level (OT)  Flowsheets (Taken 05/26/2023 0932) LTG: Pt will perform toileting task (3/3 steps) with assistance level: Supervision/Verbal cueing   Problem: RH Toilet Transfers Goal: LTG Patient will perform toilet transfers w/assist (OT) Description: LTG: Patient will perform toilet transfers with assist, with/without cues using equipment (OT) Flowsheets (Taken 05/26/2023 0932) LTG: Pt will perform toilet transfers with assistance level of: Supervision/Verbal cueing   Problem: RH Awareness Goal: LTG: Patient will demonstrate awareness during functional activites type of (OT) Description: LTG: Patient will demonstrate awareness during functional activites type of (OT) Flowsheets (Taken 05/26/2023 0932) LTG: Patient will demonstrate awareness during functional activites type of (OT): Supervision

## 2023-05-27 LAB — GLUCOSE, CAPILLARY
Glucose-Capillary: 99 mg/dL (ref 70–99)
Glucose-Capillary: 66 mg/dL — ABNORMAL LOW (ref 70–99)
Glucose-Capillary: 67 mg/dL — ABNORMAL LOW (ref 70–99)
Glucose-Capillary: 90 mg/dL (ref 70–99)
Glucose-Capillary: 213 mg/dL — ABNORMAL HIGH (ref 70–99)
Glucose-Capillary: 252 mg/dL — ABNORMAL HIGH (ref 70–99)

## 2023-05-27 MED ORDER — DEXAMETHASONE 0.5 MG PO TABS
1.0000 mg | ORAL_TABLET | Freq: Every day | ORAL | Status: AC
  Administered 2023-05-31 – 2023-06-02 (×3): 1 mg via ORAL
  Filled 2023-05-27 (×3): qty 2

## 2023-05-27 MED ORDER — DEXAMETHASONE 0.5 MG PO TABS
1.0000 mg | ORAL_TABLET | Freq: Two times a day (BID) | ORAL | Status: AC
  Administered 2023-05-28 – 2023-05-30 (×6): 1 mg via ORAL
  Filled 2023-05-27 (×6): qty 2

## 2023-05-27 MED ORDER — INSULIN GLARGINE-YFGN 100 UNIT/ML ~~LOC~~ SOLN
55.0000 [IU] | Freq: Every day | SUBCUTANEOUS | Status: DC
  Administered 2023-05-28 – 2023-05-29 (×2): 55 [IU] via SUBCUTANEOUS
  Filled 2023-05-27 (×2): qty 0.55

## 2023-05-27 MED ORDER — INSULIN GLARGINE-YFGN 100 UNIT/ML ~~LOC~~ SOLN
50.0000 [IU] | Freq: Every day | SUBCUTANEOUS | Status: DC
  Administered 2023-05-27: 50 [IU] via SUBCUTANEOUS
  Filled 2023-05-27 (×2): qty 0.5

## 2023-05-27 NOTE — Progress Notes (Signed)
Hypoglycemic Event  CBG: 56  Treatment: 4 oz juice/soda  Symptoms:  Drowsy  Follow-up CBG: Time:2150 CBG Result:108  Possible Reasons for Event: Inadequate meal intake  Comments/MD notified:See new orders    Carolyne Littles

## 2023-05-27 NOTE — Progress Notes (Signed)
Hypoglycemic Event  CBG: 67   Treatment: 4 oz juice/soda  Symptoms: None  Follow-up CBG: Time:1205  CBG Result:90   Possible Reasons for Event: Inadequate meal intake  Comments/MD notified:Notified Shearon Stalls of occurence    [name] [name]

## 2023-05-27 NOTE — Progress Notes (Signed)
   05/27/23 1550  Spiritual Encounters  Type of Visit Initial  Care provided to: Patient  Referral source Nurse (RN/NT/LPN)  Reason for visit Routine spiritual support  OnCall Visit No  Interventions  Spiritual Care Interventions Made Compassionate presence;Reflective listening  Intervention Outcomes  Outcomes Declined chaplain visit;Awareness of support    Chaplain [place] and Domenic Polite visited the pt to provide [facility]. The pt declined the visit and request for prayer. Chaplain Remi Deter let him know that the Chaplain's Office is available for support if needed.   [place] Chantille Navarrete, Armed forces operational officer

## 2023-05-27 NOTE — Plan of Care (Signed)
  Problem: Consults Goal: RH BRAIN INJURY PATIENT EDUCATION Description: Description: See Patient Education module for eduction specifics Outcome: Progressing   Problem: RH SAFETY Goal: RH STG ADHERE TO SAFETY PRECAUTIONS W/ASSISTANCE/DEVICE Description: STG Adhere to Safety Precautions With cues Assistance/Device. Outcome: Progressing   Problem: RH COGNITION-NURSING Goal: RH STG USES MEMORY AIDS/STRATEGIES W/ASSIST TO PROBLEM SOLVE Description: STG Uses Memory Aids/Strategies With cues Assistance to Problem Solve. Outcome: Progressing   Problem: RH PAIN MANAGEMENT Goal: RH STG PAIN MANAGED AT OR BELOW PT'S PAIN GOAL Description: < 4 with prns Outcome: Progressing

## 2023-05-27 NOTE — Progress Notes (Signed)
Hypoglycemic Event  CBG: 51  Treatment: 8 oz juice/soda  Symptoms:  Drowsy  Follow-up CBG: Time:2130 CBG Result:56  Possible Reasons for Event: Inadequate meal intake  Comments/MD notified: Repeating protocol    Carolyne Littles

## 2023-05-27 NOTE — Progress Notes (Addendum)
PROGRESS NOTE   Subjective/Complaints:  Seen at bedside with wife. Patient without complaints. Last BM 7/16. Passing flatus.  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 Discussed prognosis and effects of frontal lobe ABI on personality, attention, and memory.   ROS:  + Constipation + Mixed continence  Denies fevers, chills, N/V, abdominal pain, constipation, diarrhea, SOB, cough, chest pain, new weakness or paraesthesias.    Objective:   No results found. Recent Labs    05/26/23 1117  WBC 10.9*  HGB 13.8  HCT 41.6  PLT 179   Recent Labs    05/26/23 1117  NA 134*  K 4.3  CL 94*  CO2 27  GLUCOSE 178*  BUN 21  CREATININE 1.13  CALCIUM 9.3    Intake/Output Summary (Last 24 hours) at 05/27/2023 0117 Last data filed at 05/26/2023 2357 Gross per 24 hour  Intake 176 ml  Output 1225 ml  Net -1049 ml        Physical Exam: Vital Signs Blood pressure 121/79, pulse 73, temperature 98.4 F (36.9 C), temperature source Oral, resp. rate 18, height 5\' 6"  (1.676 m), weight 81.2 kg, SpO2 99%.    PE: Constitution: Appropriate appearance for age. No apparent distress . Wife at bedside.  HEENT: Long bifrontal incision, clean dry and well-approximated. Staples removed. Resp: No respiratory distress. No accessory muscle usage. on RA and CTAB Cardio: Well perfused appearance. No peripheral edema. RRR, no m/r/g.  Abdomen: Nondistended. Nontender. +BS.  Psych: Appropriate mood and affect - mildly flat  Neurologic Exam:   Pt is alert and oriented to person, month/year. With options place as "grocery store".  Follows basic commands, normal language, speech clear.  Pt is distracted and sometimes needs cues to stay on task.  Delays with processing and initiation as well.  Motor: 4+/5 BUE, LE: 4-/5 HF, 4/5 KE, 5/5 ADF/PF.  Sensory exam normal for light touch and pain  in all 4 limbs.  No limb ataxia or cerebellar signs.  No abnormal tone appreciated.       Assessment/Plan: 1. Functional deficits which require 3+ hours per day of interdisciplinary therapy in a comprehensive inpatient rehab setting. Physiatrist is providing close team supervision and 24 hour management of active medical problems listed below. Physiatrist and rehab team continue to assess barriers to discharge/monitor patient progress toward functional and medical goals  Care Tool:  Bathing    Body parts bathed by patient: Right arm, Left arm, Chest, Abdomen, Front perineal area, Right upper leg, Buttocks, Left upper leg, Right lower leg, Left lower leg, Face         Bathing assist Assist Level: Minimal Assistance - Patient > 75%     Upper Body Dressing/Undressing Upper body dressing   What is the patient wearing?: Pull over shirt    Upper body assist Assist Level: Supervision/Verbal cueing    Lower Body Dressing/Undressing Lower body dressing      What is the patient wearing?: Pants     Lower body assist Assist for lower body dressing: Minimal Assistance - Patient > 75%     Toileting Toileting    Toileting assist Assist for toileting: Minimal  Assistance - Patient > 75%     Transfers Chair/bed transfer  Transfers assist     Chair/bed transfer assist level: Minimal Assistance - Patient > 75%     Locomotion Ambulation   Ambulation assist      Assist level: Minimal Assistance - Patient > 75% Assistive device: No Device Max distance: 150'   Walk 10 feet activity   Assist     Assist level: Minimal Assistance - Patient > 75%     Walk 50 feet activity   Assist    Assist level: Minimal Assistance - Patient > 75% Assistive device: No Device    Walk 150 feet activity   Assist    Assist level: Minimal Assistance - Patient > 75%      Walk 10 feet on uneven surface  activity   Assist     Assist level: Minimal Assistance - Patient  > 75%     Wheelchair     Assist Is the patient using a wheelchair?: Yes Type of Wheelchair: Manual    Wheelchair assist level: Dependent - Patient 0% Max wheelchair distance: 150'    Wheelchair 50 feet with 2 turns activity    Assist        Assist Level: Dependent - Patient 0%   Wheelchair 150 feet activity     Assist      Assist Level: Dependent - Patient 0%   Blood pressure 121/79, pulse 73, temperature 98.4 F (36.9 C), temperature source Oral, resp. rate 18, height 5\' 6"  (1.676 m), weight 81.2 kg, SpO2 99%.    Medical Problem List and Plan: 1. 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  Antithrombotics: -DVT/anticoagulation:  Mechanical: Antiembolism stockings, thigh (TED hose) Bilateral lower extremities             -antiplatelet therapy: ASA 81 mg daily  3. Pain Management: Hydrocodone as needed  4. Mood/Behavior/Sleep:               -behavior improved after steroids. However decadron being tapered -observe for increased agitation/confusion. -maximize sleep/wake cycle              -antipsychotic agents: seroquel 25mg  every day prn agitation/sleep             -trazodone 50mg  at bedtime prn              -sleep chart  5. Neuropsych/cognition: This patient is not capable of making decisions on her own behalf.             -namenda 5mg  bid per home dosing   - wife and patient would benefit from neuropsych consult  6. Skin/Wound Care: Routine skin checks 7. Fluids/Electrolytes/Nutrition: routine lab folow up - stable.  8 Seizure prophylaxis. keppra 500 mg BID 9.Hypothyroid. Synthroid  10.DM.Semglee 60 unit daily and Glucophage 1000 mg bid  -SSI/Monitor while on decadron  - BG low this evening;  not consistent with prior readings; reduce semglee to 50 U   Recent Labs    05/26/23 2104 05/26/23 2130 05/26/23 2153  GLUCAP 51* 56* 108*     11.GERD.Protonix 12.Hyperlipidemia.Lipitor 13.HTN.Lopressor 25 mg BID ` -bp controlled at present  05/26/2023    7:24 PM 05/26/2023   12:34 PM 05/26/2023    3:49 AM  Vitals with BMI  Systolic 121 134 528  Diastolic 79 69 76  Pulse 73 76 68     LOS: 2 days A FACE TO FACE EVALUATION WAS PERFORMED  Angelina Sheriff 05/27/2023, 1:17 AM

## 2023-05-27 NOTE — Progress Notes (Signed)
PROGRESS NOTE   Subjective/Complaints:  Seen at bedside with wife. Patient without complaints. Discussed with wife patient behaviors, she states they are stable. Does endorse "staring" episodes, ongoing, started occuring shortly after surgery, where patient can be unresponsive to external stimuli for a few seconds to up to half an hour. These occur at all times and acitivies, could be sitting or standing, no jerking/loss of b/b, tongue biting, or gaze deviation other than staring forward. Afterward, she does think he is more tired, but can't be sure. Have been occurring approximately 4x per week.   ROS:  Unable to obtain d/t cognition.   Denies fevers, chills, N/V, abdominal pain, constipation, diarrhea, SOB, cough, chest pain, new weakness or paraesthesias.    Objective:   No results found. Recent Labs    05/26/23 1117  WBC 10.9*  HGB 13.8  HCT 41.6  PLT 179   Recent Labs    05/26/23 1117  NA 134*  K 4.3  CL 94*  CO2 27  GLUCOSE 178*  BUN 21  CREATININE 1.13  CALCIUM 9.3    Intake/Output Summary (Last 24 hours) at 05/27/2023 2208 Last data filed at 05/27/2023 2105 Gross per 24 hour  Intake 480 ml  Output 1100 ml  Net -620 ml        Physical Exam: Vital Signs Blood pressure 132/67, pulse 66, temperature 98.4 F (36.9 C), temperature source Oral, resp. rate 18, height 5\' 6"  (1.676 m), weight 81.2 kg, SpO2 97%.    PE: Constitution: Appropriate appearance for age. No apparent distress. Sitting up in bed. Wife at bedside.  HEENT: Long bifrontal incision, clean dry and well-approximated. Staples removed. Resp: No respiratory distress. No accessory muscle usage. on RA and CTAB Cardio: Well perfused appearance. No peripheral edema. RRR, no m/r/g.  Abdomen: Nondistended. Nontender. +BS.  Psych: Appropriate mood and affect - mildly flat  Neurologic Exam:   Pt is alert and oriented x3.  Follows basic  commands, normal language, speech clear.  Pt is distracted and sometimes needs cues to stay on task.  Delays with processing and initiation as well.  - bilaterally Motor: 4+/5 BUE, LE: 4-/5 HF, 4/5 KE, 5/5 ADF/PF.  - unchanged Sensory exam normal for light touch and pain in all 4 limbs.  No limb ataxia or cerebellar signs.  No abnormal tone appreciated.       Assessment/Plan: 1. Functional deficits which require 3+ hours per day of interdisciplinary therapy in a comprehensive inpatient rehab setting. Physiatrist is providing close team supervision and 24 hour management of active medical problems listed below. Physiatrist and rehab team continue to assess barriers to discharge/monitor patient progress toward functional and medical goals  Care Tool:  Bathing    Body parts bathed by patient: Right arm, Left arm, Chest, Abdomen, Front perineal area, Right upper leg, Buttocks, Left upper leg, Right lower leg, Left lower leg, Face         Bathing assist Assist Level: Minimal Assistance - Patient > 75%     Upper Body Dressing/Undressing Upper body dressing   What is the patient wearing?: Pull over shirt    Upper body assist Assist Level: Supervision/Verbal cueing  Lower Body Dressing/Undressing Lower body dressing      What is the patient wearing?: Pants     Lower body assist Assist for lower body dressing: Minimal Assistance - Patient > 75%     Toileting Toileting    Toileting assist Assist for toileting: Minimal Assistance - Patient > 75%     Transfers Chair/bed transfer  Transfers assist     Chair/bed transfer assist level: Minimal Assistance - Patient > 75%     Locomotion Ambulation   Ambulation assist      Assist level: Minimal Assistance - Patient > 75% Assistive device: No Device Max distance: 150'   Walk 10 feet activity   Assist     Assist level: Minimal Assistance - Patient > 75%     Walk 50 feet activity   Assist    Assist  level: Minimal Assistance - Patient > 75% Assistive device: No Device    Walk 150 feet activity   Assist    Assist level: Minimal Assistance - Patient > 75%      Walk 10 feet on uneven surface  activity   Assist     Assist level: Minimal Assistance - Patient > 75%     Wheelchair     Assist Is the patient using a wheelchair?: Yes Type of Wheelchair: Manual    Wheelchair assist level: Dependent - Patient 0% Max wheelchair distance: 150'    Wheelchair 50 feet with 2 turns activity    Assist        Assist Level: Dependent - Patient 0%   Wheelchair 150 feet activity     Assist      Assist Level: Dependent - Patient 0%   Blood pressure 132/67, pulse 66, temperature 98.4 F (36.9 C), temperature source Oral, resp. rate 18, height 5\' 6"  (1.676 m), weight 81.2 kg, SpO2 97%.    Medical Problem List and Plan: 1. 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  Antithrombotics: -DVT/anticoagulation:  Mechanical: Antiembolism stockings, thigh (TED hose) Bilateral lower extremities             -antiplatelet therapy: ASA 81 mg daily  3. Pain Management: Hydrocodone as needed  4. Mood/Behavior/Sleep:               -behavior improved after steroids. However decadron being tapered -observe for increased agitation/confusion. -maximize sleep/wake cycle              -antipsychotic agents: seroquel 25mg  every day prn agitation/sleep             -trazodone 50mg  at bedtime prn              -sleep chart  5. Neuropsych/cognition: This patient is not capable of making decisions on her own behalf.             -namenda 5mg  bid per home dosing   - wife and patient would benefit from neuropsych consult this coming  week  6. Skin/Wound Care: Routine skin checks 7. Fluids/Electrolytes/Nutrition: routine lab folow up - stable.  8 Seizure prophylaxis. keppra 500 mg BID for 14 days post-op - Wife endorsing staring spells post-op; ?absence seizures, however  some last a long time (>30 min) and none witnessed inpatient; Would engage Dr. Melynda Ripple Monday regarding possible EEG, need for ongoing keppra   9.Hypothyroid. Synthroid  10.DM.Semglee 60 unit daily and Glucophage 1000 mg bid  -SSI/Monitor while on decadron  - 7/20: BG low this evening  50s; reduce semglee to 50 U   - 7/21: BG increased this evening; increase semglee back to 55 U; if ongoing low AM and high PM might consider splitting dose  Recent Labs    05/27/23 1207 05/27/23 1608 05/27/23 2103  GLUCAP 90 213* 252*     11.GERD.Protonix 12.Hyperlipidemia.Lipitor 13.HTN.Lopressor 25 mg BID ` -bp controlled at present      05/27/2023    7:22 PM 05/27/2023    3:05 PM 05/27/2023    4:47 AM  Vitals with BMI  Systolic 132 123 875  Diastolic 67 68 79  Pulse 66 68 65   14. Steroids for cerebral edema. Resumed decadron taper on admission to hospital 7/15.  - 7/15-17: 2 mg total) by mouth Q6H - 7/18-21: 2 tablets (2 mg total) - 2 (two) times daily with a meal for 3 days - 7/22-24: tablet (1 mg total) 2 (two) times daily with a meal for 3 days - 7/25-27: 1 tablet (1 mg total) daily with breakfast for 3 days.   LOS: 2 days A FACE TO FACE EVALUATION WAS PERFORMED  Angelina Sheriff 05/27/2023, 10:08 PM

## 2023-05-28 DIAGNOSIS — R7989 Other specified abnormal findings of blood chemistry: Secondary | ICD-10-CM

## 2023-05-28 LAB — CBC WITH DIFFERENTIAL/PLATELET
Abs Immature Granulocytes: 0.09 10*3/uL — ABNORMAL HIGH (ref 0.00–0.07)
Basophils Absolute: 0 10*3/uL (ref 0.0–0.1)
Basophils Relative: 0 %
Eosinophils Absolute: 0.1 10*3/uL (ref 0.0–0.5)
Eosinophils Relative: 1 %
HCT: 39.6 % (ref 39.0–52.0)
Hemoglobin: 13 g/dL (ref 13.0–17.0)
Immature Granulocytes: 1 %
Lymphocytes Relative: 9 %
Lymphs Abs: 0.9 10*3/uL (ref 0.7–4.0)
MCH: 31.9 pg (ref 26.0–34.0)
MCHC: 32.8 g/dL (ref 30.0–36.0)
MCV: 97.1 fL (ref 80.0–100.0)
Monocytes Absolute: 0.7 10*3/uL (ref 0.1–1.0)
Monocytes Relative: 7 %
Neutro Abs: 8.4 10*3/uL — ABNORMAL HIGH (ref 1.7–7.7)
Neutrophils Relative %: 82 %
Platelets: 153 10*3/uL (ref 150–400)
RBC: 4.08 MIL/uL — ABNORMAL LOW (ref 4.22–5.81)
RDW: 13.2 % (ref 11.5–15.5)
WBC: 10.3 10*3/uL (ref 4.0–10.5)
nRBC: 0 % (ref 0.0–0.2)

## 2023-05-28 LAB — COMPREHENSIVE METABOLIC PANEL
ALT: 22 U/L (ref 0–44)
AST: 18 U/L (ref 15–41)
Albumin: 2.5 g/dL — ABNORMAL LOW (ref 3.5–5.0)
Alkaline Phosphatase: 63 U/L (ref 38–126)
Anion gap: 7 (ref 5–15)
BUN: 24 mg/dL — ABNORMAL HIGH (ref 8–23)
CO2: 28 mmol/L (ref 22–32)
Calcium: 8.7 mg/dL — ABNORMAL LOW (ref 8.9–10.3)
Chloride: 99 mmol/L (ref 98–111)
Creatinine, Ser: 1.11 mg/dL (ref 0.61–1.24)
GFR, Estimated: >60 mL/min (ref >60)
Glucose, Bld: 171 mg/dL — ABNORMAL HIGH (ref 70–99)
Potassium: 4.2 mmol/L (ref 3.5–5.1)
Sodium: 134 mmol/L — ABNORMAL LOW (ref 135–145)
Total Bilirubin: 0.2 mg/dL — ABNORMAL LOW (ref 0.3–1.2)
Total Protein: 5.4 g/dL — ABNORMAL LOW (ref 6.5–8.1)

## 2023-05-28 LAB — GLUCOSE, CAPILLARY
Glucose-Capillary: 175 mg/dL — ABNORMAL HIGH (ref 70–99)
Glucose-Capillary: 146 mg/dL — ABNORMAL HIGH (ref 70–99)

## 2023-05-28 MED ORDER — TRAZODONE HCL 50 MG PO TABS
50.0000 mg | ORAL_TABLET | Freq: Every day | ORAL | Status: DC
  Administered 2023-05-28 – 2023-06-06 (×10): 50 mg via ORAL
  Filled 2023-05-28 (×10): qty 1

## 2023-05-28 NOTE — Progress Notes (Signed)
Occupational Therapy Session Note  Patient Details  Name: [name] [name] MRN: [id number] Date of Birth: 1946-10-01  Today's Date: 05/28/2023 OT Individual Time: 2725-3664 OT Individual Time Calculation (min): 60 min    Short Term Goals: Week 1:  OT Short Term Goal 1 (Week 1): LTG=STG 2/2 ELOS  Skilled Therapeutic Interventions/Progress Updates:   Pt seen for skilled OT session with focus on am self care retraining with full shower. Pt presents with slow processing, decreased initiation and sequencing, decreased STM, low activity tolerance which compounds cognitive deficits. Pt present with humor, pleasant and cooperative requiring mod cues for initiation steps of mobility and simple ADL routine but once started is moving toward CGA for all tasks including shower seated and standing with grab bar, standing to void, full bathing, dressing and grooming routine with min A-CGA hand held stability for amb to toilet and shower transfers. Stood 3 trials of 3-4 min for oral care including dentures with CGA due to difficulty with divided attention.  Amb back from sink side to recliner with hand held assist and CGA. Pt set up with high interest word search at bed side table, LE's elevated, needs, nurse call button and chair alarm active.     Pain: declined any pain   Therapy Documentation Precautions:  Precautions Precautions: Fall Restrictions Weight Bearing Restrictions: No    Therapy/Group: Individual Therapy  Vicenta Dunning 05/28/2023, 7:39 AM

## 2023-05-28 NOTE — Progress Notes (Signed)
Inpatient Rehabilitation  Patient information reviewed and entered into eRehab system by Melissa M. Bowie, M.A., CCC/SLP, PPS Coordinator.  Information including medical coding, functional ability and quality indicators will be reviewed and updated through discharge.    

## 2023-05-28 NOTE — IPOC Note (Signed)
Overall Plan of Care [facility]) Patient Details Name: [name] [name] MRN: [id number] DOB: 02-19-1946  Admitting Diagnosis: Meningioma [facility])  Hospital Problems: Principal Problem:   Meningioma [facility])     Functional Problem List: Nursing Bladder, Bowel, Endurance, Medication Management, Safety, Pain  PT Balance, Behavior, Endurance, Motor, Safety  OT Balance, Behavior, Cognition, Endurance, Nutrition, Perception, Safety  SLP Cognition  TR         Basic ADL's: OT Bathing, Dressing, Toileting, Eating, Grooming     Advanced  ADL's: OT       Transfers: PT Bed Mobility, Bed to Chair, Car, Occupational psychologist, Research scientist (life sciences): PT Ambulation, Stairs     Additional Impairments: OT None  SLP Social Cognition   Memory, Attention, Awareness, Problem Solving  TR      Anticipated Outcomes Item Anticipated Outcome  Self Feeding Mod I  Swallowing      Basic self-care  Supervision  Toileting  Supervision/mod I   Bathroom Transfers Mod I/Supervision  Bowel/Bladder  manage bowel w mod I and bladder w mod I/toileting  Transfers  Supervision  Locomotion  Supervision  Communication     Cognition  minA  Pain  < 4 with prns  Safety/Judgment  manage w cues   Therapy Plan: PT Intensity: Minimum of 1-2 x/day ,45 to 90 minutes PT Frequency: 5 out of 7 days PT Duration Estimated Length of Stay: 7-10 Days OT Intensity: Minimum of 1-2 x/day, 45 to 90 minutes OT Frequency: 5 out of 7 days OT Duration/Estimated Length of Stay: 7-10 days SLP Intensity: Minumum of 1-2 x/day, 30 to 90 minutes SLP Frequency: 3 to 5 out of 7 days SLP Duration/Estimated Length of Stay: 6-9 days   Team Interventions: Nursing Interventions Disease Management/Prevention, Bladder Management, Medication Management, Discharge Planning, Cognitive Remediation/Compensation, Pain Management, Bowel Management, Patient/Family Education  PT interventions Ambulation/gait training, Community  reintegration, DME/adaptive equipment instruction, Neuromuscular re-education, Psychosocial support, Stair training, UE/LE Strength taining/ROM, Warden/ranger, Discharge planning, Therapeutic Activities, UE/LE Coordination activities, Cognitive remediation/compensation, Functional mobility training, Patient/family education, Splinting/orthotics, Therapeutic Exercise  OT Interventions Warden/ranger, Cognitive remediation/compensation, Community reintegration, Discharge planning, Disease mangement/prevention, DME/adaptive equipment instruction, Functional electrical stimulation, Functional mobility training, Neuromuscular re-education, Pain management, Patient/family education, Psychosocial support, Self Care/advanced ADL retraining, Skin care/wound managment, Splinting/orthotics, Therapeutic Activities, Therapeutic Exercise, UE/LE Strength taining/ROM, UE/LE Coordination activities, Visual/perceptual remediation/compensation, Wheelchair propulsion/positioning  SLP Interventions Cognitive remediation/compensation, Internal/external aids, Financial trader, Environmental controls, Therapeutic Activities, Functional tasks, Patient/family education, Therapeutic Exercise  TR Interventions    SW/CM Interventions Discharge Planning, Psychosocial Support, Patient/Family Education   Barriers to Discharge MD  Medical stability and Behavior  Nursing Decreased caregiver support, Home environment access/layout 1 level 3 ste left rail w spouse; Pt reports independence with ADLs though per chart, recent difficulty since DC s/p sx. Per prior admission, spouse assisting with IADLs though pt reports able to cook at home. During recent admission pt requiring min guard to min assist for ambulation without assistive device. prior to surgery, no AD needed and pt moving fairly well  PT      OT      SLP      SW Decreased caregiver support, Lack of/limited family support, Insurance for SNF  coverage     Team Discharge Planning: Destination: PT-Home ,OT- Home , SLP-Home Projected Follow-up: PT-24 hour supervision/assistance, Outpatient PT, OT-  Outpatient OT, SLP-Outpatient SLP, Home Health SLP Projected Equipment Needs: PT-To be determined, OT- To be determined, SLP-None recommended by SLP Equipment  Details: PT- , OT-  Patient/family involved in discharge planning: PT- Patient,  OT-Patient, SLP-Patient, Family member/caregiver  MD ELOS: 7-10 days Medical Rehab Prognosis:  Excellent Assessment: The patient has been admitted for CIR therapies with the diagnosis of meningioma s/p resection/crani with post-op swelling and cognitive-behavioral changes. . The team will be addressing functional mobility, strength, stamina, balance, safety, adaptive techniques and equipment, self-care, bowel and bladder mgt, patient and caregiver education, NMR, behavior-cognition, community reentry. Goals have been set at supervision to mod I with mobility and self-care and min assist with cognition and behavior. Anticipated discharge destination is home with wife.        See Team Conference Notes for weekly updates to the plan of care

## 2023-05-28 NOTE — Progress Notes (Signed)
Inpatient Rehabilitation Care Coordinator Assessment and Plan Patient Details  Name: [name] [name] MRN: [id number] Date of Birth: Jul 06, 1946  Today's Date: 05/28/2023  Hospital Problems: Principal Problem:   Meningioma [facility])  Past Medical History:  Past Medical History:  Diagnosis Date   Bladder infection    10th grade   CAD (coronary artery disease)    s/p 3V CABG 2001   Cervical spondylosis 2002   History of   Colon cancer (HCC)    Diabetes mellitus (HCC)    Dysphagia    GERD (gastroesophageal reflux disease)    Heart murmur    pt has had echo   HTN (hypertension)    Hyperlipidemia    Hypothyroidism    LVH (left ventricular hypertrophy) 2015   Mild   Myocardial infarction (HCC)    x2   PVC (premature ventricular contraction)    a. s/p ablation   Tobacco abuse, in remission    Past Surgical History:  Past Surgical History:  Procedure Laterality Date   ANTERIOR CERVICAL DISCECTOMY  03/20/2001   Many levels.  Dr Franky Macho   APPLICATION OF CRANIAL NAVIGATION N/A 05/08/2023   Procedure: APPLICATION OF CRANIAL NAVIGATION;  Surgeon: Dawley, Alan Mulder, DO;  Location: [facility] OR;  Service: Neurosurgery;  Laterality: N/A;   Bone spur removal right shoulder     CHOLECYSTECTOMY     CORONARY ARTERY BYPASS GRAFT  10/23/2000   x3 Dr Tyrone Sage   CRANIOTOMY N/A 05/08/2023   Procedure: BIFRONTAL CRANIOTOMY TUMOR EXCISION;  Surgeon: Bethann Goo, DO;  Location: [facility] OR;  Service: Neurosurgery;  Laterality: N/A;   FACIAL FRACTURE SURGERY     after being hit with baseball   MOUTH SURGERY     Pineal cyst removal     PROCTOSCOPY N/A 09/19/2018   Procedure: RIGID PROCTOSCOPY;  Surgeon: Karie Soda, MD;  Location: [facility] ORS;  Service: General;  Laterality: N/A;   V-TACH ABLATION N/A 02/15/2015   PVC ablation by Dr Ladona Ridgel   Social History:  reports that he quit smoking about 22 years ago. His smoking use included cigarettes. He started smoking about 57 years ago. He has a 105 pack-year smoking  history. He has never used smokeless tobacco. He reports that he does not drink alcohol and does not use drugs.  Family / Support Systems Marital Status: Married How Long?: 38 years Patient Roles: Spouse Spouse/Significant Other: [name] [name] (wife) Children: no children Other Supports: none Anticipated Caregiver: wife Ability/Limitations of Caregiver: Wife will be primary caregiver. 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 Education: high school grad Health Literacy - How often do you need to have someone help you when you read instructions, pamphlets, or other written material from your doctor or pharmacy?: Never Writes: Yes Employment Status: Retired Date Retired/Disabled/Unemployed: 2023 Age Retired: 75 Marine scientist Issues: Denies Guardian/Conservator: N/A   Abuse/Neglect Abuse/Neglect Assessment Can Be Completed: Yes Physical Abuse: Denies Verbal Abuse: Denies Sexual Abuse: Denies Exploitation of patient/patient's resources: Denies Self-Neglect: Denies  Patient response to: Social Isolation - How often do you feel lonely or isolated from those around you?: Sometimes  Emotional Status Pt's affect, behavior and adjustment status: Pt adjusting to current situation as his memory has been affected , and states he cant remember anything. Recent Psychosocial Issues: See above Psychiatric History: Pt reports he was dperessed when he had two heart attacksin 2001  and was depressed since he was not able to do much. Denies MH hospitalization and taking medication. Substance Abuse History: Reports he quit smoking cigarettes in 2001. Denies etoh and reg drug use.  Patient / Family Perceptions, Expectations & Goals Pt/Family understanding of illness & functional limitations: Pt and  wife Premorbid pt/family roles/activities: Independent primarily with some assistance with ADLs/IADLs Anticipated changes in roles/activities/participation: wife Pt/family expectations/goals: "Get out of her and go home."  [employer]: None Premorbid Home Care/DME Agencies: None Transportation available at discharge: wife Is the patient able to respond to transportation needs?: Yes In the past 12 months, has lack of transportation kept you from medical appointments or from getting medications?: No In the past 12 months, has lack of transportation kept you from meetings, work, or from getting things needed for daily living?: No Resource referrals recommended: Neuropsychology  Discharge Planning Living Arrangements: Spouse/significant other Support Systems: Spouse/significant other Type of Residence: Private residence Civil engineer, contracting: Media planner (specify) (BCBS Medicare) Financial Resources: Restaurant manager, fast food Screen Referred: No Living Expenses: Own Money Management: Spouse Does the patient have any problems obtaining your medications?: No Home Management: Wife manages all home care needs Patient/Family Preliminary Plans: no changes Care Coordinator Barriers to Discharge: Decreased caregiver support, Lack of/limited family support, Insurance for SNF coverage Care Coordinator Anticipated Follow Up Needs: HH/OP Expected length of stay: 7-10 days  Clinical Impression SW met with pt in room at bedside. Pt appears confused with answering some questions for SW. He remains pleasant. He is not a Cytogeneticist. No HCPOA. DME- RW.   Arnita Koons A Khyri Hinzman 05/28/2023, 11:03 AM

## 2023-05-28 NOTE — Progress Notes (Signed)
Physical Therapy Session Note  Patient Details  Name: [name] [name] MRN: [id number] Date of Birth: February 05, 1946  Today's Date: 05/28/2023 PT Missed Time: 30 Minutes Missed Time Reason: Patient unwilling to participate  Short Term Goals: Week 1:  PT Short Term Goal 1 (Week 1): STGs = LTGs  Skilled Therapeutic Interventions/Progress Updates:      Therapy Documentation Precautions:  Precautions Precautions: Fall Restrictions Weight Bearing Restrictions: No  Pt refused PT despite max encouragement and education on benefits. Pt missed 30 minutes of skilled PT, plan to make up as able.     Therapy/Group: Individual Therapy  Truitt Leep Truitt Leep PT, DPT  05/28/2023, 5:57 AM

## 2023-05-28 NOTE — Progress Notes (Signed)
PROGRESS NOTE   Subjective/Complaints:  Pt with intermittent participation in therapies today. Pt non-agitated but refused to participate at times, delayed in processing.   ROS: Limited due to cognitive/behavioral   Objective:   No results found. Recent Labs    05/26/23 1117 05/28/23 0731  WBC 10.9* 10.3  HGB 13.8 13.0  HCT 41.6 39.6  PLT 179 153   Recent Labs    05/26/23 1117 05/28/23 0731  NA 134* 134*  K 4.3 4.2  CL 94* 99  CO2 27 28  GLUCOSE 178* 171*  BUN 21 24*  CREATININE 1.13 1.11  CALCIUM 9.3 8.7*    Intake/Output Summary (Last 24 hours) at 05/28/2023 1804 Last data filed at 05/28/2023 1547 Gross per 24 hour  Intake 736 ml  Output 700 ml  Net 36 ml        Physical Exam: Vital Signs Blood pressure 127/75, pulse 72, temperature 98.1 F (36.7 C), temperature source Oral, resp. rate 17, height 5\' 6"  (1.676 m), weight 81.2 kg, SpO2 98%.    Constitutional: No distress . Vital signs reviewed. HEENT: large frontal incision.  EOMI, oral membranes moist Neck: supple Cardiovascular: RRR without murmur. No JVD    Respiratory/Chest: CTA Bilaterally without wheezes or rales. Normal effort    GI/Abdomen: BS +, non-tender, non-distended Ext: no clubbing, cyanosis, or edema Psych: flat, slow to engage Neurologic:   Pt is alert and oriented x3.  Follows basic commands, normal language, speech clear.  Slow to process. Needs cues to engage.  Motor: 4+/5 BUE, LE: 4-/5 HF, 4/5 KE, 5/5 ADF/PF.  - unchanged Sensory exam normal for light touch and pain in all 4 limbs.  No limb ataxia or cerebellar signs.  No abnormal tone appreciated.       Assessment/Plan: 1. Functional deficits which require 3+ hours per day of interdisciplinary therapy in a comprehensive inpatient rehab setting. Physiatrist is providing close team supervision and 24 hour management of active medical problems listed below. Physiatrist  and rehab team continue to assess barriers to discharge/monitor patient progress toward functional and medical goals  Care Tool:  Bathing    Body parts bathed by patient: Right arm, Left arm, Chest, Abdomen, Front perineal area, Right upper leg, Buttocks, Left upper leg, Right lower leg, Left lower leg, Face         Bathing assist Assist Level: Minimal Assistance - Patient > 75%     Upper Body Dressing/Undressing Upper body dressing   What is the patient wearing?: Pull over shirt    Upper body assist Assist Level: Supervision/Verbal cueing    Lower Body Dressing/Undressing Lower body dressing      What is the patient wearing?: Pants     Lower body assist Assist for lower body dressing: Minimal Assistance - Patient > 75%     Toileting Toileting    Toileting assist Assist for toileting: Minimal Assistance - Patient > 75%     Transfers Chair/bed transfer  Transfers assist     Chair/bed transfer assist level: Minimal Assistance - Patient > 75%     Locomotion Ambulation   Ambulation assist      Assist level: Minimal Assistance - Patient >  75% Assistive device: No Device Max distance: 150'   Walk 10 feet activity   Assist     Assist level: Minimal Assistance - Patient > 75%     Walk 50 feet activity   Assist    Assist level: Minimal Assistance - Patient > 75% Assistive device: No Device    Walk 150 feet activity   Assist    Assist level: Minimal Assistance - Patient > 75%      Walk 10 feet on uneven surface  activity   Assist     Assist level: Minimal Assistance - Patient > 75%     Wheelchair     Assist Is the patient using a wheelchair?: Yes Type of Wheelchair: Manual    Wheelchair assist level: Dependent - Patient 0% Max wheelchair distance: 150'    Wheelchair 50 feet with 2 turns activity    Assist        Assist Level: Dependent - Patient 0%   Wheelchair 150 feet activity     Assist      Assist  Level: Dependent - Patient 0%   Blood pressure 127/75, pulse 72, temperature 98.1 F (36.7 C), temperature source Oral, resp. rate 17, height 5\' 6"  (1.676 m), weight 81.2 kg, SpO2 98%.    Medical Problem List and Plan: 1. Functional deficits secondary to Large bi frontal axial tumor suspect meningioma.S/O cranio resection 7/2 with H/O meningioma two years ago.Decadron taper             -patient may  shower             -ELOS/Goals: 6-9 days, supervision goals with PT, OT, sup-min assist with cognition and behavior  -Continue CIR therapies including PT, OT, and SLP. Team continues to work on motivation and encouragement.  2.  Antithrombotics: -DVT/anticoagulation:  Mechanical: Antiembolism stockings, thigh (TED hose) Bilateral lower extremities             -antiplatelet therapy: ASA 81 mg daily  3. Pain Management: Hydrocodone as needed  4. Mood/Behavior/Sleep:               -behavior improving but still unwilling to participate in therapies at times.  -observe for increased agitation/confusion. -maximize sleep/wake cycle              -antipsychotic agents: seroquel 25mg  every day prn agitation/sleep             -trazodone 50mg  at bedtime---will schedule tonight.              -sleep chart--sleep has been inconsistent.   5. Neuropsych/cognition: This patient is not capable of making decisions on her own behalf.             -namenda 5mg  bid per home dosing  -7/22 consider stimulant to help with initiation. Might have neurology weigh in on stimulant/sz risk.    - wife and patient would benefit from neuropsych consult    6. Skin/Wound Care: Routine skin checks 7. Fluids/Electrolytes/Nutrition: pt is dry.Marland KitchenMarland Kitchenpush po fluids -recheck bmet 7/23  8 Seizure prophylaxis. keppra 500 mg BID for 14 days post-op - Wife has reported staring spells post-op; ?absence seizures, however some last a long time (>30 min) and none witnessed inpatient -no similar activity reported yesterday or today  -primary  team to reach out to Dr. Melynda Ripple regarding possible EEG, need for ongoing keppra   9.Hypothyroid. Synthroid  10.DM.Semglee 60 unit daily and Glucophage 1000 mg bid  -SSI/Monitor while on decadron  - 7/20: BG  low this evening  50s; reduce semglee to 50 U   - 7/21: BG increased this evening; increase semglee back to 55 U; if ongoing low AM and high PM might consider splitting dose  -7/22 CBG's better today with recent changes--follow for pattern Recent Labs    05/27/23 2103 05/28/23 0558 05/28/23 1142  GLUCAP 252* 175* 146*     11.GERD.Protonix 12.Hyperlipidemia.Lipitor 13.HTN.Lopressor 25 mg BID ` -bp controlled at present      05/28/2023    1:07 PM 05/28/2023    4:46 AM 05/27/2023    7:22 PM  Vitals with BMI  Systolic 127 140 829  Diastolic 75 82 67  Pulse 72 64 66   14. Steroids for cerebral edema. Resumed decadron taper on admission to hospital 7/15.  - 7/15-17: 2 mg total) by mouth Q6H - 7/18-21: 2 tablets (2 mg total) - 2 (two) times daily with a meal for 3 days - 7/22-24: tablet (1 mg total) 2 (two) times daily with a meal for 3 days - 7/25-27: 1 tablet (1 mg total) daily with breakfast for 3 days.   LOS: 3 days A FACE TO FACE EVALUATION WAS PERFORMED  Ranelle Oyster 05/28/2023, 6:04 PM

## 2023-05-28 NOTE — Progress Notes (Signed)
At 2300 rounding patient awake in bed requesting applesauce. Applesauce given and patient consumed 100%. Trazodone offered but patient refused. Patient states "I'll fall back asleep later." At 0045 patient laying in bed and watching tv. No complaints of pain and no requests at this time.

## 2023-05-28 NOTE — Progress Notes (Signed)
Speech Language Pathology Daily Session Note  Patient Details  Name: [name] [name] MRN: [id number] Date of Birth: September 19, 1946  Today's Date: 05/28/2023 SLP Individual Time: 0815-0915 SLP Individual Time Calculation (min): 60 min  Short Term Goals: Week 1: SLP Short Term Goal 1 (Week 1): Patient will demonstrate sustained attention to therapeutic task for >8minutes with min verbal cues. SLP Short Term Goal 2 (Week 1): Patient will recall 3 external memory strategies for use at home with min verbal cues. SLP Short Term Goal 3 (Week 1): Patient will demonstrate increased awareness of impairments by self-correcting on therapeutic task with minA multimodal cues.  Skilled Therapeutic Interventions:   Pt greeted at bedside as he was completing his morning meal. Tx tasks targeting cognition and language were facilitated. SLP provided pt with a calendar and utilized errorless learning to ID today's date. SLP also attempted to orient pt regarding recent admission dates/length of hospital stay thus far, but pt was adamant that the information provided was incorrect. Pt unable to recall prev tumor resection ~2 years ago and recalled recent admission date as July 5th (actual resection/admission was July 2nd). SLP attempted to provide passive reorientation, though task discontinued 2* lack of pt mental flexibility. SLP introduced structured word finding tasks via mildly complex responsive naming and concrete convergent naming. He benefited from only spv cues for word finding, but required modA visual/verbal cues d/t sustained and selective attention deficits. Attention negatively impacted success during generative naming task as well, as pt demonstrated moderate deficits. He benefited from maxA verbal cues for attention, information processing, and problem solving during verbal problem solving task requiring sentence length responses. Suspect cognitive fatigue negatively impacted success as well. Pt demonstrated  minimal awareness of errors throughout tx tasks, benefiting from passive/gentle education. Will benefit from continued education. Pt left in bed with his alarm set and call light within reach. Recommend cont ST per POC.   Pain Pain Assessment Pain Scale: 0-10 Pain Score: 0-No pain  Therapy/Group: Individual Therapy  [name] 05/28/2023, 9:06 AM

## 2023-05-28 NOTE — Progress Notes (Signed)
Patient ID: [name] [name], male   DOB: [birth year], 77 y.o.   MRN: [id number]  SW spoke with pt wife to introduce self, explain role, inform on ELOS, and discuss discharge process. Pt wife reports that she will be primary caregiver, and she is the only caregiver. States she has osteoarthritis and 14 broken bones so not able to physically assist. She is also dealing with placing her mother into hospice today. She is overwhelmed, and hopeful that his behaviors will subside before he returns home. She dicussed additional assistance needed. SW discussed private aide, and aide service offered for insurance being one hr per week for personal care only. Unable to afford private aide; and he is not a veteran so limited aide options. Fam edu scheduled for Thursday (7/25) 1pm-4pm.SW will follow-up with updates after team conference.   [name], MSW, LCSWA Office: [phone] Cell: [phone] Fax: [phone]

## 2023-05-28 NOTE — Progress Notes (Signed)
Physical Therapy Session Note  Patient Details  Name: [name] [name] MRN: [id number] Date of Birth: January 29, 1946  Today's Date: 05/28/2023 PT Individual Time: 1400-      Short Term Goals: Week 1:  PT Short Term Goal 1 (Week 1): STGs = LTGs  Skilled Therapeutic Interventions/Progress Updates:    Chart reviewed. Pt greeted and asked to participate in therapy. Pt verbalized feeling bored without clear answer on participating. PT asked about pain, and pt pointed to hip/buttock region. PT attempted to clarify pain location, but pt denied that pain was in hip, glutes, LE or back. Pt offered no other explanation of pain location when asked. PT attempted to initiate session by asking pt to participate in amb to therapy gym. PT also clarified with that pt currently prefers to practice amb without AD. PT then again attempted to initiate session by asking pt to stand. Pt leaned forward in chair and waited >2 mins with eyes closed. PT inquired if pt felt ill, but pt denied. PT clarified that pt understand the request to stand up, which pt verified but continued to not initiate standing. PT used gestures and tactile cues to attempt initiating, but pt remained immobile despite confirming understanding of request. Pt then frequently began positioning to stand, but stopped before initiation. PT then asked if pt was willing to participate, and pt gave uncertain answer. PT waited another 5 mins, with pt not attempting to stand despite encouragement. PT then indicated that pt needed to attempt standing or give verbal refusal. Pt confirmed understanding. Pt then given another 2 mins, with PT verbalizing. Pt continued  to remain immobile and refusing to attempt initiation of movement. Pt alert and awake t/o attempt. Pt informed of CIR policy with refusal. At end of session, pt was left seated in recliner with alarm engaged, nurse call bell and all needs in reach.     Therapy Documentation Precautions:   Precautions Precautions: Fall Restrictions Weight Bearing Restrictions: No General: PT Amount of Missed Time (min): 60 Minutes PT Missed Treatment Reason: Patient unwilling to participate    Therapy/Group: Individual Therapy  Dionne Milo, PT, DPT 05/28/2023, 2:51 PM

## 2023-05-28 NOTE — Care Management (Signed)
[facility] Individual Statement of Services  Patient Name:  [name] [name]  Date:  05/28/2023  Welcome to the [facility].  Our goal is to provide you with an individualized program based on your diagnosis and situation, designed to meet your specific needs.  With this comprehensive rehabilitation program, you will be expected to participate in at least 3 hours of rehabilitation therapies Monday-Friday, with modified therapy programming on the weekends.  Your rehabilitation program will include the following services:  Physical Therapy (PT), Occupational Therapy (OT), Speech Therapy (ST), 24 hour per day rehabilitation nursing, Therapeutic Recreaction (TR), Psychology, Neuropsychology, Care Coordinator, Rehabilitation Medicine, Nutrition Services, Pharmacy Services, and Other  Weekly team conferences will be held on Tuesdays to discuss your progress.  Your Inpatient Rehabilitation Care Coordinator will talk with you frequently to get your input and to update you on team discussions.  Team conferences with you and your family in attendance may also be held.  Expected length of stay: 7-10 days    Overall anticipated outcome: Supervision  Depending on your progress and recovery, your program may change. Your Inpatient Rehabilitation Care Coordinator will coordinate services and will keep you informed of any changes. Your Inpatient Rehabilitation Care Coordinator's name and contact numbers are listed  below.  The following services may also be recommended but are not provided by the Inpatient [facility]:  Driving Evaluations Home Health Rehabiltiation Services Outpatient Rehabilitation Services Vocational Rehabilitation   Arrangements will be made to provide these services after discharge if needed.  Arrangements include referral to agencies that provide these services.  Your insurance has been verified to be:  [employer]  Your primary  doctor is:  Kaleen Mask  Pertinent information will be shared with your doctor and your insurance company.  Inpatient Rehabilitation Care Coordinator:  Susie Cassette [phone] or ([phone]  Information discussed with and copy given to patient by: Gretchen Short, 05/28/2023, 9:40 AM

## 2023-05-29 ENCOUNTER — Inpatient Hospital Stay (HOSPITAL_COMMUNITY): Payer: Medicare Other

## 2023-05-29 DIAGNOSIS — D329 Benign neoplasm of meninges, unspecified: Secondary | ICD-10-CM | POA: Diagnosis not present

## 2023-05-29 DIAGNOSIS — R569 Unspecified convulsions: Secondary | ICD-10-CM

## 2023-05-29 LAB — BASIC METABOLIC PANEL
Anion gap: 6 (ref 5–15)
BUN: 23 mg/dL (ref 8–23)
CO2: 30 mmol/L (ref 22–32)
Calcium: 8.8 mg/dL — ABNORMAL LOW (ref 8.9–10.3)
Chloride: 98 mmol/L (ref 98–111)
Creatinine, Ser: 1.16 mg/dL (ref 0.61–1.24)
GFR, Estimated: >60 mL/min (ref >60)
Glucose, Bld: 112 mg/dL — ABNORMAL HIGH (ref 70–99)
Potassium: 3.6 mmol/L (ref 3.5–5.1)
Sodium: 134 mmol/L — ABNORMAL LOW (ref 135–145)

## 2023-05-29 LAB — GLUCOSE, CAPILLARY
Glucose-Capillary: 51 mg/dL — ABNORMAL LOW (ref 70–99)
Glucose-Capillary: 76 mg/dL (ref 70–99)
Glucose-Capillary: 253 mg/dL — ABNORMAL HIGH (ref 70–99)
Glucose-Capillary: 174 mg/dL — ABNORMAL HIGH (ref 70–99)
Glucose-Capillary: 149 mg/dL — ABNORMAL HIGH (ref 70–99)

## 2023-05-29 MED ORDER — INSULIN GLARGINE-YFGN 100 UNIT/ML ~~LOC~~ SOLN
50.0000 [IU] | Freq: Every day | SUBCUTANEOUS | Status: DC
  Administered 2023-05-30 – 2023-06-01 (×3): 50 [IU] via SUBCUTANEOUS
  Filled 2023-05-29 (×3): qty 0.5

## 2023-05-29 NOTE — Plan of Care (Signed)
Patient progressing 

## 2023-05-29 NOTE — Progress Notes (Signed)
Hypoglycemic Event  CBG: 51  Treatment: 4 oz juice/soda  Symptoms: None  Follow-up CBG: Time:6:50 CBG Result:76  Possible Reasons for Event: Unknown  Comments/MD notified:on call provider Dois Davenport, [place] notified    Alta Corning

## 2023-05-29 NOTE — Procedures (Signed)
Patient Name: [name] [name]  MRN: [id number]  Epilepsy Attending: Charlsie Quest  Referring Physician/Provider: Angelina Sheriff, DO  Date: 05/29/2023 Duration: 23.38 mins  Patient history: 77 Y/O male with history of with H/O CAD/CABG HTN DM HLD as well as colon cancer and bifrontal craniotomy resection for meningioma 2 years ago. EEG to evaluate for seizure  Level of alertness: Awake, asleep  AEDs during EEG study: LEV  Technical aspects: This EEG study was done with scalp electrodes positioned according to the 10-20 International system of electrode placement. Electrical activity was reviewed with band pass filter of 1-70Hz , sensitivity of 7 uV/mm, display speed of 33mm/sec with a 60Hz  notched filter applied as appropriate. EEG data were recorded continuously and digitally stored.  Video monitoring was available and reviewed as appropriate.  Description: The posterior dominant rhythm consists of 8 Hz activity of moderate voltage (25-35 uV) seen predominantly in posterior head regions, symmetric and reactive to eye opening and eye closing. Sleep was characterized by vertex waves, sleep spindles (12 to 14 Hz), maximal frontocentral region. EEG showed intermittent generalized and maximal bifrontal 3 to 6 Hz theta-delta slowing. Physiologic photic driving was not seen during photic stimulation.  Hyperventilation was not performed.     ABNORMALITY - Intermittent slow, generalized and maximal bifrontal  IMPRESSION: This study is suggestive of cortical dysfunction arising from bifrontal region as well as mild diffuse encephalopathy. No seizures or epileptiform discharges were seen throughout the recording.   [name] [name] [name]

## 2023-05-29 NOTE — Progress Notes (Signed)
PROGRESS NOTE   Subjective/Complaints:  No acute complaints. Per nursing does have intermittent headaches. Memory remains poor. Oriented to all except his own first name today.  Discussed staring episodes described by wife over the weekend; none since IPR admission. He does remember when these occurred prior to admission and states he had no memory of the episodes themselves, but would feel very tired afterward. No tongue biting, shaking, or loss of b/b during episodes (note however patient is intermittently incontinent and a poor historian)  Episode of hypoglycemia this AM 51. Reduced semglee back to 50 u. Eating 75-100% meals.  LBM 7/22  ROS: Limited due to cognitive/behavioral. No pain, CP, SOB.   Objective:   No results found. Recent Labs    05/26/23 1117 05/28/23 0731  WBC 10.9* 10.3  HGB 13.8 13.0  HCT 41.6 39.6  PLT 179 153   Recent Labs    05/28/23 0731 05/29/23 0717  NA 134* 134*  K 4.2 3.6  CL 99 98  CO2 28 30  GLUCOSE 171* 112*  BUN 24* 23  CREATININE 1.11 1.16  CALCIUM 8.7* 8.8*    Intake/Output Summary (Last 24 hours) at 05/29/2023 0915 Last data filed at 05/29/2023 0655 Gross per 24 hour  Intake 1000 ml  Output 700 ml  Net 300 ml        Physical Exam: Vital Signs Blood pressure 109/73, pulse 63, temperature 98.3 F (36.8 C), resp. rate 17, height 5\' 6"  (1.676 m), weight 81.2 kg, SpO2 96%.    Constitutional: No distress . Vital signs reviewed. Sitting in WC at nurse's station.  HEENT: large frontal incision.  EOMI, oral membranes moist Neck: supple Cardiovascular: RRR without murmur. No JVD    Respiratory/Chest: CTA Bilaterally without wheezes or rales. Normal effort    GI/Abdomen: BS +, non-tender, non-distended Ext: no clubbing, cyanosis, or edema Psych: flat, slow to engage - somewhat improved tody Neurologic:   Pt is alert and oriented to place, time, and last name; not first  name.  Follows basic commands, normal language, speech clear.  Slow to process. Needs cues to engage.  - improved Motor: 4+/5 BUE, LE: 4-/5 HF, 4/5 KE, 5/5 ADF/PF.  - unchanged Sensory exam normal for light touch and pain in all 4 limbs.  No limb ataxia or cerebellar signs.  No abnormal tone appreciated.       Assessment/Plan: 1. Functional deficits which require 3+ hours per day of interdisciplinary therapy in a comprehensive inpatient rehab setting. Physiatrist is providing close team supervision and 24 hour management of active medical problems listed below. Physiatrist and rehab team continue to assess barriers to discharge/monitor patient progress toward functional and medical goals  Care Tool:  Bathing    Body parts bathed by patient: Right arm, Left arm, Chest, Abdomen, Front perineal area, Right upper leg, Buttocks, Left upper leg, Right lower leg, Left lower leg, Face         Bathing assist Assist Level: Minimal Assistance - Patient > 75%     Upper Body Dressing/Undressing Upper body dressing   What is the patient wearing?: Pull over shirt    Upper body assist Assist Level: Supervision/Verbal cueing  Lower Body Dressing/Undressing Lower body dressing      What is the patient wearing?: Pants     Lower body assist Assist for lower body dressing: Minimal Assistance - Patient > 75%     Toileting Toileting    Toileting assist Assist for toileting: Minimal Assistance - Patient > 75%     Transfers Chair/bed transfer  Transfers assist     Chair/bed transfer assist level: Minimal Assistance - Patient > 75%     Locomotion Ambulation   Ambulation assist      Assist level: Minimal Assistance - Patient > 75% Assistive device: No Device Max distance: 150'   Walk 10 feet activity   Assist     Assist level: Minimal Assistance - Patient > 75%     Walk 50 feet activity   Assist    Assist level: Minimal Assistance - Patient > 75% Assistive  device: No Device    Walk 150 feet activity   Assist    Assist level: Minimal Assistance - Patient > 75%      Walk 10 feet on uneven surface  activity   Assist     Assist level: Minimal Assistance - Patient > 75%     Wheelchair     Assist Is the patient using a wheelchair?: Yes Type of Wheelchair: Manual    Wheelchair assist level: Dependent - Patient 0% Max wheelchair distance: 150'    Wheelchair 50 feet with 2 turns activity    Assist        Assist Level: Dependent - Patient 0%   Wheelchair 150 feet activity     Assist      Assist Level: Dependent - Patient 0%   Blood pressure 109/73, pulse 63, temperature 98.3 F (36.8 C), resp. rate 17, height 5\' 6"  (1.676 m), weight 81.2 kg, SpO2 96%.    Medical Problem List and Plan: 1. Functional deficits secondary to Large bi frontal axial tumor suspect meningioma.S/O cranio resection 7/2 with H/O meningioma two years ago.Decadron taper             -patient may  shower             -ELOS/Goals: 6-9 days, supervision goals with PT, OT, sup-min assist with cognition and behavior - 7/30  -Continue CIR therapies including PT, OT, and SLP. Team continues to work on motivation and encouragement.   2.  Antithrombotics: -DVT/anticoagulation:  Mechanical: Antiembolism stockings, thigh (TED hose) Bilateral lower extremities             -antiplatelet therapy: ASA 81 mg daily  3. Pain Management: Hydrocodone as needed  4. Mood/Behavior/Sleep:               -behavior improving but still unwilling to participate in therapies at times.  -observe for increased agitation/confusion. -maximize sleep/wake cycle              -antipsychotic agents: seroquel 25mg  every day prn agitation/sleep             -trazodone 50mg  at bedtime---will schedule tonight.              -sleep chart--sleep has been inconsistent - >7 hours 7/23; good  5. Neuropsych/cognition: This patient is not capable of making decisions on her own  behalf.             -namenda 5mg  bid per home dosing  -7/23: D/w neurology regarding ? Seizures and need for EEG prior to starting stimulant (likely modafinil vs. Ritalin) d/t possible  seizure risk   - wife and patient would benefit from neuropsych consult    6. Skin/Wound Care: Routine skin checks 7. Fluids/Electrolytes/Nutrition: pt is dry.Marland KitchenMarland Kitchenpush po fluids -recheck bmet 7/23  8 Seizure prophylaxis. keppra 500 mg BID for 14 days post-op - Wife has reported staring spells post-op; ?absence seizures, however some last a long time (>30 min) and none witnessed inpatient -no similar activity reported while inpatient  - 7/23: D/w neurology, no consult needed, ordered EEG and f/u OP with [facility] for 72 hour study after discharge if no findings   9.Hypothyroid. Synthroid  10.DM.Semglee 60 unit daily and Glucophage 1000 mg bid  -SSI/Monitor while on decadron  - 7/20: BG low this evening  50s; reduce semglee to 50 U   - 7/21: BG increased this evening; increase semglee back to 55 U; if ongoing low AM and high PM might consider splitting dose  -7/22 CBG's better today with recent changes--follow for pattern   - 7/23: AM hypoglycemia again - moving target while weaning steroids - will go back to 50U semglee and allow highs to avoid lows Recent Labs    05/28/23 1142 05/29/23 0625 05/29/23 0650  GLUCAP 146* 51* 76     11.GERD.Protonix 12.Hyperlipidemia.Lipitor 13.HTN.Lopressor 25 mg BID ` -bp controlled at present      05/29/2023    5:35 AM 05/28/2023    8:29 PM 05/28/2023    1:07 PM  Vitals with BMI  Systolic 109 144 865  Diastolic 73 76 75  Pulse 63 69 72   14. Steroids for cerebral edema. Resumed decadron taper on admission to hospital 7/15.  - 7/15-17: 2 mg total) by mouth Q6H - 7/18-21: 2 tablets (2 mg total) - 2 (two) times daily with a meal for 3 days - 7/22-24: tablet (1 mg total) 2 (two) times daily with a meal for 3 days - 7/25-27: 1 tablet (1 mg total) daily with  breakfast for 3 days.   LOS: 4 days A FACE TO FACE EVALUATION WAS PERFORMED  Angelina Sheriff 05/29/2023, 9:15 AM

## 2023-05-29 NOTE — Discharge Instructions (Addendum)
Inpatient Rehab Discharge Instructions  [facility] Discharge date and time: No discharge date for patient encounter.   Activities/Precautions/ Functional Status: Activity: As tolerated Diet: Diabetic diet Wound Care: Routine skin checks Functional status:  ___ No restrictions     ___ Walk up steps independently ___ 24/7 supervision/assistance   ___ Walk up steps with assistance ___ Intermittent supervision/assistance  ___ Bathe/dress independently ___ Walk with walker     _x__ Bathe/dress with assistance ___ Walk Independently    ___ Shower independently ___ Walk with assistance    ___ Shower with assistance ___ No alcohol     ___ Return to work/school ________  Special Instructions: No driving smoking or alcohol   COMMUNITY REFERRALS UPON DISCHARGE:    Home Health:   PT     OT     ST  SNA    SW                   Agency: [facility]     Phone: [phone] *Please expect follow-up within 2-3 days to schedule your home visit. If you have not received follow-up, be sure to contact the branch directly.*     My questions have been answered and I understand these instructions. I will adhere to these goals and the provided educational materials after my discharge from the hospital.  Patient/Caregiver Signature _______________________________ Date __________  Clinician Signature _______________________________________ Date __________  Please bring this form and your medication list with you to all your follow-up doctor's appointments.

## 2023-05-29 NOTE — Progress Notes (Signed)
Speech Language Pathology Daily Session Note  Patient Details  Name: [name] [name] MRN: [id number] Date of Birth: Nov 24, 1945  Today's Date: 05/29/2023 SLP Individual Time: 1430-1530 SLP Individual Time Calculation (min): 60 min  Short Term Goals: Week 1: SLP Short Term Goal 1 (Week 1): Patient will demonstrate sustained attention to therapeutic task for >67minutes with min verbal cues. SLP Short Term Goal 2 (Week 1): Patient will recall 3 external memory strategies for use at home with min verbal cues. SLP Short Term Goal 3 (Week 1): Patient will demonstrate increased awareness of impairments by self-correcting on therapeutic task with minA multimodal cues.  Skilled Therapeutic Interventions:   Pt greeted at bedside, awake/alert upon SLP arrival. His wife was present at bedside as well. SLP facilitated a variety of tx tasks targeting cognition. Pt required maxA cues to initiate responses throughout simple naming tasks, as he did not respond or held up his hand for a response. When he did respond, he only required minA cues for word finding 2* semantic errors. Pt's wife attempted to assist with initiation periodically, though he still required maxA cues. SLP provided education re compensatory strategies for home environment to assist w/ memory. Based on report from pt's wife, he maintains a sedentary lifestyle at baseline and she has been assisting w/ ADLs/IADLs. Recommend cont ST per POC.   Pain  No pain reported.   Therapy/Group: Individual Therapy  [name] 05/29/2023, 7:47 AM

## 2023-05-29 NOTE — Progress Notes (Signed)
Spoke with RN to coordinate a good time to come up for the EEG. Pt will be done with therapy at 1600. Will try back as schedule permits.

## 2023-05-29 NOTE — Progress Notes (Signed)
Physical Therapy Session Note  Patient Details  Name: [name] [name] MRN: [id number] Date of Birth: September 18, 1946  Today's Date: 05/29/2023 PT Individual Time: 1st Treatment Session: 1000-1030; 2nd Treatment Session: 2952-8413 PT Individual Time Calculation (min): 30 min; 70 min  Short Term Goals: Week 1:  PT Short Term Goal 1 (Week 1): STGs = LTGs  Skilled Therapeutic Interventions/Progress Updates:  1st Treatment Session- Patient greeted sitting upright in wheelchair in his room attempting to manage foot rests on his wheelchair- Patient agreeable to PT treatment session. Patient requesting to use the restroom prior to heading to the rehab gym- Patient stood from wheelchair without the use of an AD and ambulated to/from the bathroom all with CGA/SBA for safety. Patient stood to toilet, however soiled his shirt and pants in the process. Patient stood to wash his hands at the sink with SBA for safety. Patient returned to a seated position in the wc where he required increased time and multimodal cues for doffing soiled clothes and donning new clean clothes. Patient was able to perform dressing with set-up assistance and increased time required to complete. Patient left sitting upright in wheelchair in his room with posey belt on, call bell within reach and all needs met.   2nd Treatment Session- Patient greeted sitting upright in wheelchair in his room with spouse present and agreeable to PT treatment session. Patient propelled manual wheelchair with BUE and supv for improved propulsion technique and obstacle negotiation.   Patient stood from wheelchair without the use of an AD and CGA, however required a significant amount of time and multimodal cues for initiating standing. Eventually, patient was able to stand with "1,2,3 stand up."   Patient gait trained ~180' without the use of an AD and CGA- Patient easily distracted throughout gait trial and reaching for various items on the counter at the RN  station and reaching out for the railing in the hallway for UE support. Toward the end of gait trial, therapist noticed a smell and patient had an incontinent BM. Patient ambulated to the bathroom in the gym where he toileted and continued to have a continent BM. Therapist managed brief and pericare in order to ensure cleanliness. Patient required >15 minutes to initiate standing from the toilet and eventually required Max/TotalA from therapist for standing for pericare. Patient then gait trained back to his room and transitioned from sitting EOB to supine with Supv. Patient left supine in bed with call bell within reach, bed alarm on, spouse present and all needs met.    Therapy Documentation Precautions:  Precautions Precautions: Fall Restrictions Weight Bearing Restrictions: No  Pain: 1st Treatment Session- No/Denies pain.  2nd Treatment Session- No/Denies pain.    Therapy/Group: Individual Therapy  [name] 05/29/2023, 8:02 AM

## 2023-05-29 NOTE — Progress Notes (Signed)
Patient ID: [name] [name], male   DOB: 1946/10/12, 77 y.o.   MRN: [id number]  SW went by pt room to provide updates pt but pt not present, and pt in the middle of a procedure. SW will follow-up with pt wife.   [name], MSW, LCSWA Office: [phone] Cell: [phone] Fax: [phone]

## 2023-05-29 NOTE — Progress Notes (Signed)
Occupational Therapy Session Note  Patient Details  Name: [name] [name] MRN: [id number] Date of Birth: Jun 28, 1946  Today's Date: 05/29/2023 OT Individual Time: [phone] OT Individual Time Calculation (min): 57 min    Short Term Goals: Week 1:  OT Short Term Goal 1 (Week 1): LTG=STG 2/2 ELOS  Skilled Therapeutic Interventions/Progress Updates:    Pt laying in bed awake, but shaking head no to getting up. OT spent extended time trying to get pt to initiate bed mobility. Pt noted to be soaked with urine. Pt unaware and unphased. Nursing came in to assist and encourage pt. He ended up needing mod A to initiate moving legs towards EOB, then he started to sit up on his own. UB bathing completed seated EOB with increased time to initiate and CGA. Pt needed miultimodal cues to initiate stand with CGA and cues to initiate washing peri-area. Pt would sit before finishing washing requiring multiple cues to initiate sit<>stands and ended up needing OT assist to cleanse buttocks and don clean brief due to time restraints. Pt left seated in wc with nursing present and end of session.   Therapy Documentation Precautions:  Precautions Precautions: Fall Restrictions Weight Bearing Restrictions: No Pain:  Denies pain   Therapy/Group: Individual Therapy  Mal Amabile 05/29/2023, 9:41 AM

## 2023-05-29 NOTE — Progress Notes (Signed)
Occupational Therapy Session Note  Patient Details  Name: [name] [name] MRN: [id number] Date of Birth: 1946-03-20  Today's Date: 05/29/2023 OT Individual Time: 1125-1200 OT Individual Time Calculation (min): 35 min    Short Term Goals: Week 1:  OT Short Term Goal 1 (Week 1): LTG=STG 2/2 ELOS     Skilled Therapeutic Interventions/Progress Updates:    Pt received sitting in wheelchair.  Pt had no c/o pain and was agreeable to going outdoors for OT treatment session.  OTS and pt met in hallway by nurse needing to check blood sugar.  Extended time required for pt to receive insulin d/t sugar being high.  Pt taken outdoors and pt educated on safety precautions when returning home related to medication managements, safety when cooking meals, and what to do in the event of an emergency.  Pt able to accurately describe safe decision making in the event of emergency and adequately able to problem solve.  Pt back in room in wheelchair with call light in place, chair alarm set and all needs met.    Therapy Documentation Precautions:  Precautions Precautions: Fall Restrictions Weight Bearing Restrictions: No     Therapy/Group: Individual Therapy  Liam Graham 05/29/2023, 12:48 PM

## 2023-05-29 NOTE — Patient Care Conference (Signed)
Inpatient RehabilitationTeam Conference and Plan of Care Update Date: 05/29/2023   Time: 10:49 AM    Patient Name: [name] [name]      Medical Record Number: [id number]  Date of Birth: September 27, 1946 Sex: Male         Room/Bed: 4W19C/4W19C-01 Payor Info: Payor: [employer] MEDICARE / Plan: BCBS MEDICARE / Product Type: *No Product type* /    Admit Date/Time:  05/25/2023  5:51 PM  Primary Diagnosis:  Meningioma [facility])  Hospital Problems: Principal Problem:   Meningioma [facility])    Expected Discharge Date: Expected Discharge Date: 06/05/23  Team Members Present: Physician leading conference: Dr. Elijah Birk Social Worker Present: [name], LCSWA Nurse Present: Vedia Pereyra, RN PT Present: Amedeo Plenty, PT OT Present: Kearney Hard, OT SLP Present: Jeannie Done, SLP PPS Coordinator present : Fae Pippin, SLP     Current Status/Progress Goal Weekly Team Focus  Bowel/Bladder   continent/Incontinent of B&B, LBM 7/22   Regain continence   Assess toileting q shift and as needed with timed toileting    Swallow/Nutrition/ Hydration               ADL's   mod cues for initiation steps of mobility and simple ADL routine but once started is moving toward CGA for all tasks including shower seated and standing with grab bar, standing to void, full bathing, dressing and grooming routine with min A-CGA hand held stability for toilet and shower transfers. Loves word searches and keeps occupied   mod I/S   slow processing, decreased initiation and sequencing, decreased STM, low activity tolerance compounds cognitive deficits but is humerous, pleasant and cooperative    Mobility   maxA bed mobility (secondary to poor initiation), minA transfers and ambulation 150' without AD   Supervision  Initiation, endurance, high level balance, ambulation, family ed    Communication   moderate word finding deficits and mild auditory comprehension deficits, however, cognitive  deficits negatively impact success   express wants/needs w/ minA   functional word finding tasks, comprehension of instructions during functional tasks (i.e. following directions during a transfer, assisting w/ set up of tray, etc)    Safety/Cognition/ Behavioral Observations  moderate to severe attention and problem solving deficits; severe memory deficits. Wife reported suspect dementia @ baseline.   consistent modA   verbal and functional problem solving tasks, use of external memory aids to assist w/ recall    Pain   Rates pain 0 out 10, prn Hydrocodone as needed   Remain free of pain   Assess pain q shift and as needed    Skin   Surgical Incision to frontal lobe of head   Maintain good skin integrity  Assess skin q shift and as needed      Discharge Planning:  Pt will d/c to home with wife who is primary caregiver. Wife is not able to provide any physical assistance due to physical ailments. SW will confirm there are no barriers to discharge.   Team Discussion: Meningioma. Time toileting. May still be having seizures. Several hypoglycemic events. Patient unaware of incontinence when therapy arrives.  Takes a very long time for patient to complete a task.  Patient on target to meet rehab goals: yes, patient continues to work towards goals with a discharge date of 06/05/23  *See Care Plan and progress notes for long and short-term goals.   Revisions to Treatment Plan:  Neurology/EEG.  Adjusting Semglee. Weaning steroids. Monitor labs/VS Teaching Needs: Medications, safety, self care,  gait/transfer training, etc.   Current Barriers to Discharge: Decreased caregiver support, Incontinence, and Behavior  Possible Resolutions to Barriers: Family education Regain continence of bowel/bladder Ability to monitor self behaviors Order recommended DME     Medical Summary Current Status: medically complicated by urinary incontinence, headaches, ?seizures, mood/behavioral  deficits, uncontrolled blood glucose  Barriers to Discharge: Behavior/Mood;Medical stability;Self-care education;Uncontrolled Pain;Incontinence  Barriers to Discharge Comments: behaviors/cognitive deficits, ?seizures, uncontrolled diabetes, incontinence Possible Resolutions to [employer] Focus: weaning steroids, titrating DM medications, timed toiletting, Neurology evaluation this week for staring epsides ?seizures vs. adding medications for alertness/attention   Continued Need for Acute Rehabilitation Level of Care: The patient requires daily medical management by a physician with specialized training in physical medicine and rehabilitation for the following reasons: Direction of a multidisciplinary physical rehabilitation program to maximize functional independence : Yes Medical management of patient stability for increased activity during participation in an intensive rehabilitation regime.: Yes Analysis of laboratory values and/or radiology reports with any subsequent need for medication adjustment and/or medical intervention. : Yes   I attest that I was present, lead the team conference, and concur with the assessment and plan of the team.   Jearld Adjutant 05/29/2023, 3:14 PM

## 2023-05-29 NOTE — Plan of Care (Signed)
  Problem: Consults Goal: RH BRAIN INJURY PATIENT EDUCATION Description: Description: See Patient Education module for eduction specifics Outcome: Progressing   Problem: RH BOWEL ELIMINATION Goal: RH STG MANAGE BOWEL WITH ASSISTANCE Description: STG Manage Bowel with mod I/toileting Assistance. Outcome: Progressing Goal: RH STG MANAGE BOWEL W/MEDICATION W/ASSISTANCE Description: STG Manage Bowel with Medication with mod I  Assistance. Outcome: Progressing   Problem: RH BLADDER ELIMINATION Goal: RH STG MANAGE BLADDER WITH ASSISTANCE Description: STG Manage Bladder With toileting Assistance Outcome: Progressing Goal: RH STG MANAGE BLADDER WITH MEDICATION WITH ASSISTANCE Description: STG Manage Bladder With Medication With mod I Assistance. Outcome: Progressing   Problem: RH SAFETY Goal: RH STG ADHERE TO SAFETY PRECAUTIONS W/ASSISTANCE/DEVICE Description: STG Adhere to Safety Precautions With cues Assistance/Device. Outcome: Progressing   Problem: RH COGNITION-NURSING Goal: RH STG USES MEMORY AIDS/STRATEGIES W/ASSIST TO PROBLEM SOLVE Description: STG Uses Memory Aids/Strategies With cues Assistance to Problem Solve. Outcome: Progressing   Problem: RH PAIN MANAGEMENT Goal: RH STG PAIN MANAGED AT OR BELOW PT'S PAIN GOAL Description: < 4 with prns Outcome: Progressing   Problem: RH KNOWLEDGE DEFICIT BRAIN INJURY Goal: RH STG INCREASE KNOWLEDGE OF SELF CARE AFTER BRAIN INJURY Description: Patient and family will be able to manage care at discharge , medications and dietary modification using educational resources independently Outcome: Progressing   Problem: Education: Goal: Knowledge of the prescribed therapeutic regimen will improve Outcome: Progressing   Problem: Clinical Measurements: Goal: Usual level of consciousness will be regained or maintained. Outcome: Progressing Goal: Neurologic status will improve Outcome: Progressing Goal: Ability to maintain intracranial  pressure will improve Outcome: Progressing   Problem: Skin Integrity: Goal: Demonstration of wound healing without infection will improve Outcome: Progressing

## 2023-05-30 LAB — GLUCOSE, CAPILLARY
Glucose-Capillary: 132 mg/dL — ABNORMAL HIGH (ref 70–99)
Glucose-Capillary: 102 mg/dL — ABNORMAL HIGH (ref 70–99)
Glucose-Capillary: 171 mg/dL — ABNORMAL HIGH (ref 70–99)

## 2023-05-30 NOTE — Progress Notes (Signed)
PROGRESS NOTE   Subjective/Complaints:  No acute complaints. D/w patient results of EEG, no further staring episodes endorsed. Seems in better spirits today; joking at nurse's station No epileptiform discharges seen on EEG; DC keppra as >14 days out from surgey, monitor closely Vitals stable. BG better controlled today.   ROS: Limited due to cognitive/behavioral. No pain, CP, SOB.   Objective:   EEG adult  Result Date: 05/29/2023 Charlsie Quest, MD     05/29/2023  5:51 PM Patient Name: [name] [name] MRN: [id number] Epilepsy Attending: Charlsie Quest Referring Physician/Provider: Angelina Sheriff, DO Date: 05/29/2023 Duration: 23.38 mins Patient history: 77 Y/O male with history of with H/O CAD/CABG HTN DM HLD as well as colon cancer and bifrontal craniotomy resection for meningioma 2 years ago. EEG to evaluate for seizure Level of alertness: Awake, asleep AEDs during EEG study: LEV Technical aspects: This EEG study was done with scalp electrodes positioned according to the 10-20 International system of electrode placement. Electrical activity was reviewed with band pass filter of 1-70Hz , sensitivity of 7 uV/mm, display speed of 29mm/sec with a 60Hz  notched filter applied as appropriate. EEG data were recorded continuously and digitally stored.  Video monitoring was available and reviewed as appropriate. Description: The posterior dominant rhythm consists of 8 Hz activity of moderate voltage (25-35 uV) seen predominantly in posterior head regions, symmetric and reactive to eye opening and eye closing. Sleep was characterized by vertex waves, sleep spindles (12 to 14 Hz), maximal frontocentral region. EEG showed intermittent generalized and maximal bifrontal 3 to 6 Hz theta-delta slowing. Physiologic photic driving was not seen during photic stimulation.  Hyperventilation was not performed.   ABNORMALITY - Intermittent slow, generalized and  maximal bifrontal IMPRESSION: This study is suggestive of cortical dysfunction arising from bifrontal region as well as mild diffuse encephalopathy. No seizures or epileptiform discharges were seen throughout the recording. Charlsie Quest   Recent Labs    05/28/23 0731  WBC 10.3  HGB 13.0  HCT 39.6  PLT 153   Recent Labs    05/28/23 0731 05/29/23 0717  NA 134* 134*  K 4.2 3.6  CL 99 98  CO2 28 30  GLUCOSE 171* 112*  BUN 24* 23  CREATININE 1.11 1.16  CALCIUM 8.7* 8.8*   No intake or output data in the 24 hours ending 05/30/23 0808       Physical Exam: Vital Signs Blood pressure 119/71, pulse 72, temperature 98.1 F (36.7 C), temperature source Oral, resp. rate 18, height 5\' 6"  (1.676 m), weight 81.2 kg, SpO2 96%.    Constitutional: No distress . Vital signs reviewed. Sitting in WC at nurse's station.  HEENT: large frontal incision.  EOMI, oral membranes moist Neck: supple Cardiovascular: RRR without murmur. No JVD    Respiratory/Chest: CTA Bilaterally without wheezes or rales. Normal effort    GI/Abdomen: BS +, non-tender, non-distended Ext: no clubbing, cyanosis, or edema Psych: flat, slow to engage - improving Skin: +excoriations on frontal incisions Neurologic:   Pt is alert and oriented to person, place, time Follows basic commands, normal language, speech clear.  Slow to process. Needs cues to engage.  - improved Motor: 4+/5  BUE, LE: 4-/5 HF, 4/5 KE, 5/5 ADF/PF.  - unchanged Sensory exam normal for light touch and pain in all 4 limbs.  No limb ataxia or cerebellar signs.  No abnormal tone appreciated.       Assessment/Plan: 1. Functional deficits which require 3+ hours per day of interdisciplinary therapy in a comprehensive inpatient rehab setting. Physiatrist is providing close team supervision and 24 hour management of active medical problems listed below. Physiatrist and rehab team continue to assess barriers to discharge/monitor patient progress  toward functional and medical goals  Care Tool:  Bathing    Body parts bathed by patient: Right arm, Left arm, Chest, Abdomen, Front perineal area, Right upper leg, Buttocks, Left upper leg, Right lower leg, Left lower leg, Face         Bathing assist Assist Level: Minimal Assistance - Patient > 75%     Upper Body Dressing/Undressing Upper body dressing   What is the patient wearing?: Pull over shirt    Upper body assist Assist Level: Supervision/Verbal cueing    Lower Body Dressing/Undressing Lower body dressing      What is the patient wearing?: Pants     Lower body assist Assist for lower body dressing: Minimal Assistance - Patient > 75%     Toileting Toileting    Toileting assist Assist for toileting: Minimal Assistance - Patient > 75%     Transfers Chair/bed transfer  Transfers assist     Chair/bed transfer assist level: Minimal Assistance - Patient > 75%     Locomotion Ambulation   Ambulation assist      Assist level: Minimal Assistance - Patient > 75% Assistive device: No Device Max distance: 150'   Walk 10 feet activity   Assist     Assist level: Minimal Assistance - Patient > 75%     Walk 50 feet activity   Assist    Assist level: Minimal Assistance - Patient > 75% Assistive device: No Device    Walk 150 feet activity   Assist    Assist level: Minimal Assistance - Patient > 75%      Walk 10 feet on uneven surface  activity   Assist     Assist level: Minimal Assistance - Patient > 75%     Wheelchair     Assist Is the patient using a wheelchair?: Yes Type of Wheelchair: Manual    Wheelchair assist level: Dependent - Patient 0% Max wheelchair distance: 150'    Wheelchair 50 feet with 2 turns activity    Assist        Assist Level: Dependent - Patient 0%   Wheelchair 150 feet activity     Assist      Assist Level: Dependent - Patient 0%   Blood pressure 119/71, pulse 72, temperature  98.1 F (36.7 C), temperature source Oral, resp. rate 18, height 5\' 6"  (1.676 m), weight 81.2 kg, SpO2 96%.    Medical Problem List and Plan: 1. Functional deficits secondary to Large bi frontal axial tumor suspect meningioma.S/O cranio resection 7/2 with H/O meningioma two years ago.Decadron taper             -patient may  shower             -ELOS/Goals: 6-9 days, supervision goals with PT, OT, sup-min assist with cognition and behavior - 7/30  -Continue CIR therapies including PT, OT, and SLP. Team continues to work on motivation and encouragement.   2.  Antithrombotics: -DVT/anticoagulation:  Mechanical: Antiembolism stockings,  thigh (TED hose) Bilateral lower extremities             -antiplatelet therapy: ASA 81 mg daily  3. Pain Management: Hydrocodone as needed  4. Mood/Behavior/Sleep:               -behavior improving but still unwilling to participate in therapies at times.  -observe for increased agitation/confusion. -maximize sleep/wake cycle              -antipsychotic agents: seroquel 25mg  every day prn agitation/sleep             -trazodone 50mg  at bedtime---will schedule tonight.              -sleep chart--sleep has been inconsistent - >7 hours 7/23-24; good  5. Neuropsych/cognition: This patient is not capable of making decisions on her own behalf.             -namenda 5mg  bid per home dosing  -7/23: D/w neurology regarding ? Seizures and need for EEG prior to starting stimulant (likely modafinil vs. Ritalin) d/t possible seizure risk   - wife and patient would benefit from neuropsych consult     - pending on how he does with Keppra wean, add Ritalin in AM 5 mg BID  6. Skin/Wound Care: Routine skin checks    - 7/24: Patient scratching incision sites; educated on need to avoid infection, add benadryl cream PRN  7. Fluids/Electrolytes/Nutrition: pt is dry.Marland KitchenMarland Kitchenpush po fluids -recheck bmet 7/23   8 Seizure prophylaxis. keppra 500 mg BID for 14 days post-op - Wife has  reported staring spells post-op; ?absence seizures, however some last a long time (>30 min) and none witnessed inpatient -no similar activity reported while inpatient  - 7/23: D/w neurology, no consult needed, ordered EEG and f/u OP with [facility] for 72 hour study after discharge if no findings - 7/24: EEG no epileptiform discharges, generalized findings in frontal area. D/w wife. DC keppra and monitor.    9.Hypothyroid. Synthroid  10.DM.Semglee 60 unit daily and Glucophage 1000 mg bid  -SSI/Monitor while on decadron  - 7/20: BG low this evening  50s; reduce semglee to 50 U   - 7/21: BG increased this evening; increase semglee back to 55 U; if ongoing low AM and high PM might consider splitting dose  -7/22 CBG's better today with recent changes--follow for pattern   - 7/23: AM hypoglycemia again - moving target while weaning steroids - will go back to 50U semglee and allow highs to avoid lows   - 7/24: MUCH better controlled with steroid wean; monitor Recent Labs    05/29/23 1703 05/29/23 2112 05/30/23 0605  GLUCAP 174* 149* 132*     11.GERD.Protonix 12.Hyperlipidemia.Lipitor 13.HTN.Lopressor 25 mg BID ` -bp controlled at present      05/30/2023    4:57 AM 05/29/2023    7:42 PM 05/29/2023    2:24 PM  Vitals with BMI  Systolic 119 121 440  Diastolic 71 72 72  Pulse 72 79 80   14. Steroids for cerebral edema. Resumed decadron taper on admission to hospital 7/15.  - 7/15-17: 2 mg total) by mouth Q6H - 7/18-21: 2 tablets (2 mg total) - 2 (two) times daily with a meal for 3 days - 7/22-24: tablet (1 mg total) 2 (two) times daily with a meal for 3 days - 7/25-27: 1 tablet (1 mg total) daily with breakfast for 3 days.   LOS: 5 days A FACE TO FACE EVALUATION WAS PERFORMED  Lequita Halt  Christia Reading 05/30/2023, 8:08 AM

## 2023-05-30 NOTE — Progress Notes (Addendum)
Patient ID: [name] [name], male   DOB: 06-19-1946, 77 y.o.   MRN: [id number]  SW left message for pt wife [name] [name] to provide updates from team conference, requesting return phone call.  *SW received phone call from pt wife [name] [name]. SW provided updates from team conference, and d/c date 7/30. SW shared that he was Wife reports concerns about not being able to assist him with changing his clothes due to her physical health limitations. She report she went to the doctor today and he wrote a letter  to say she is unable to help with any kind of physical assistance which includes changing his clothes and wanted to know if this is helpful. SW discussed options such as- HH with aide if recommended or hiring private care. Also discussed SNF but reminded her even if he her husband were to get into a facility, she would still have to work on creating a discharge plan because he could be there 20 days or less. She is overwhelmed with her life's situation right now. SW will leave SNF list based on insurance in room, and sitter list. She would also like follow-up about  EEG results from yesterday. SW confirms family edu tomorrow.   *SW received phone call from patient's stepdaughter [name] [name] who wanted to know the conversation held with her mother. SW discussed above. SW encouraged speaking with eldercare attorney if considering Medicaid. SW encouraged her to come in for family edu tomorrow with her mother if possible.   NCPASRR# [id number]  SW sent out SNF referral.   SW left SNF list and sitter list in room.   [name], MSW, LCSWA Office: [phone] Cell: [phone] Fax: [phone]

## 2023-05-30 NOTE — Progress Notes (Signed)
Occupational Therapy Session Note  Patient Details  Name: [name] [name] MRN: [id number] Date of Birth: [birth year]  Today's Date: 05/30/2023 OT Individual Time: 1348-1500 OT Individual Time Calculation (min): 72 min    Short Term Goals: Week 1:  OT Short Term Goal 1 (Week 1): LTG=STG 2/2 ELOS  Skilled Therapeutic Interventions/Progress Updates:    Patient received seated in recliner chair.  Patient pleasant, but reporting irritation at safety belt, stating - "I have to put it on just right, but when I take it off, everyone comes in here and tells me not to."  Reminded patient that belt was for safety, and he should call with any needs to get up.   Patient indicates he had a hard time getting up for therapy earlier today.  He was aware that it took him an hour to get out of bed, and when he was ready to stand - the session was over.  Patient denies physical or cognitive changes since his surgery.  Patient does endorse fatigue.   Patient agreeable to walk to therapy gym.  Worked on walking at more natural speed and controlling R LE to reduce foot slap, and toe drag.  Patient has three dogs at home.  He is responsible at times to feed two smaller dogs.  Simulated feeding dogs - scooping dog food into bowls, reaching to floor to place bowls down, retrieving bowls from floor.    Patient able to locate his room walking back from gym.  Patient still showing some stimulus bound behavior, needing to interact with many things in his visual field - not necessarily related to task at hand.  Once in room, wife at bedside.  Patient encouraged to shower as this was last therapy session of the day.  Patient agreeable - but with significant delay between directive and action.  Patient resistant to physical cueing, did better with teasing/ coaxing.  Patient ambulated to shower and bathed himself with min assist.  Dressed himself with min assist and frequent cues to remain on task.  Patient left in bed with bed alarm  engaged, call bell and personal items in reach, and wife at bedside.    Therapy Documentation Precautions:  Precautions Precautions: Fall Restrictions Weight Bearing Restrictions: No    Pain:  Denies pain        Therapy/Group: Individual Therapy  Collier Salina 05/30/2023, 3:25 PM

## 2023-05-30 NOTE — Progress Notes (Signed)
Physical Therapy Session Note  Patient Details  Name: [name] [name] MRN: [id number] Date of Birth: 1946/01/18  Today's Date: 05/30/2023 PT Individual Time: 4098-1191 PT Individual Time Calculation (min): 60 min   Short Term Goals: Week 1:  PT Short Term Goal 1 (Week 1): STGs = LTGs  Skilled Therapeutic Interventions/Progress Updates:  Patient greeted supine in bed with B feet off the bed and NT present assisting with pericare and donning new brief- Care transitioned to PT. Therapist provided multimodal cues with a significant amount of time required to transition from supine to sitting EOB secondary to poor initiation with limited desire to sit EOB as patient states he would prefer to sleep. With education, multimodal cues and increased time patient was able to sit EOB with MinA for righting trunk and use of bed rail. While sitting EOB, therapist threaded pants for time management and patient donned shirt with set-up assistance. Patient required >30 minutes to transition to standing secondary poor initiation with limited desire to stand- Patient stated that he was telling his body to move but it wouldn't. Therapist spent time educating patient about his injury and why he is in the hospital and the effects the locating of his surgery has on executive function, etc. Eventually, patient required MaxA to initiate standing in order to remove soiled brief and pull up clean brief and pants. Patient then transferred to sitting in wheelchair without AD and SBA for safety. Patient left sitting upright in wc with posey belt on, call bell within reach and all needs met.    Therapy Documentation Precautions:  Precautions Precautions: Fall Restrictions Weight Bearing Restrictions: No  Pain: No/Denies pain.    Therapy/Group: Individual Therapy  Haden Suder 05/30/2023, 7:57 AM

## 2023-05-30 NOTE — Plan of Care (Signed)
  Problem: Consults Goal: RH BRAIN INJURY PATIENT EDUCATION Description: Description: See Patient Education module for eduction specifics Outcome: Progressing   Problem: RH BOWEL ELIMINATION Goal: RH STG MANAGE BOWEL WITH ASSISTANCE Description: STG Manage Bowel with mod I/toileting Assistance. Outcome: Progressing Goal: RH STG MANAGE BOWEL W/MEDICATION W/ASSISTANCE Description: STG Manage Bowel with Medication with mod I  Assistance. Outcome: Progressing   Problem: RH BLADDER ELIMINATION Goal: RH STG MANAGE BLADDER WITH ASSISTANCE Description: STG Manage Bladder With toileting Assistance Outcome: Progressing Goal: RH STG MANAGE BLADDER WITH MEDICATION WITH ASSISTANCE Description: STG Manage Bladder With Medication With mod I Assistance. Outcome: Progressing   Problem: RH SAFETY Goal: RH STG ADHERE TO SAFETY PRECAUTIONS W/ASSISTANCE/DEVICE Description: STG Adhere to Safety Precautions With cues Assistance/Device. Outcome: Progressing   Problem: RH COGNITION-NURSING Goal: RH STG USES MEMORY AIDS/STRATEGIES W/ASSIST TO PROBLEM SOLVE Description: STG Uses Memory Aids/Strategies With cues Assistance to Problem Solve. Outcome: Progressing   Problem: RH PAIN MANAGEMENT Goal: RH STG PAIN MANAGED AT OR BELOW PT'S PAIN GOAL Description: < 4 with prns Outcome: Progressing   Problem: RH KNOWLEDGE DEFICIT BRAIN INJURY Goal: RH STG INCREASE KNOWLEDGE OF SELF CARE AFTER BRAIN INJURY Description: Patient and family will be able to manage care at discharge , medications and dietary modification using educational resources independently Outcome: Progressing   Problem: Education: Goal: Knowledge of the prescribed therapeutic regimen will improve Outcome: Progressing   Problem: Clinical Measurements: Goal: Usual level of consciousness will be regained or maintained. Outcome: Progressing Goal: Neurologic status will improve Outcome: Progressing Goal: Ability to maintain intracranial  pressure will improve Outcome: Progressing   Problem: Skin Integrity: Goal: Demonstration of wound healing without infection will improve Outcome: Progressing

## 2023-05-30 NOTE — Progress Notes (Signed)
Speech Language Pathology Daily Session Note  Patient Details  Name: [name] [name] MRN: [id number] Date of Birth: Mar 24, 1946  Today's Date: 05/30/2023 SLP Individual Time: 0815-0915 SLP Individual Time Calculation (min): 60 min  Short Term Goals: Week 1: SLP Short Term Goal 1 (Week 1): Patient will demonstrate sustained attention to therapeutic task for >57minutes with min verbal cues. SLP Short Term Goal 2 (Week 1): Patient will recall 3 external memory strategies for use at home with min verbal cues. SLP Short Term Goal 3 (Week 1): Patient will demonstrate increased awareness of impairments by self-correcting on therapeutic task with minA multimodal cues.  Skilled Therapeutic Interventions: SLP conducted skilled therapy session targeting cognitive retraining. SLP received patient reclined in bed watching television. Patient was agreeable to therapy session. SLP targeted sustained attention, initiation, external memory strategies, and awareness of impairments via various therapeutic tasks. Compared to previous sessions per chart review, patient's initiation appears improved this morning during conversation requiring only minA to respond to conversation prompts. During writing task, patient required max to totalA to initiate writing behavior. Patient utilized wall calendar independently to orient to date and anticipated date of discharge. SLP reviewed additional memory strategy 'write it down' and assisted patient with recall of information from MD this AM via writing down important changes to medications. Patient required max to totalA to remember and write down pertinent information. Throughout session, patient required mod-maxA to sustain attention to task but reported that attention was impaired prior to current admission. Patient exhibits improved awareness of deficits, citing "motivation" as his biggest deficit across all three therapies independently. During memory strategy training, he also  exhibited awareness via statement "my mind just goes blank." SLP requested patient write down events that occur throughout the day to assist with recall, however question carryover. Patient was left in lowered bed with call bell in reach and bed alarm set. SLP will continue to target goals per plan of care.     Pain Pain Assessment Pain Scale: 0-10 Pain Score: 0-No pain  Therapy/Group: Individual Therapy  [name] [name], M.A., CCC-SLP  Yetta Barre 05/30/2023, 8:51 AM

## 2023-05-31 LAB — GLUCOSE, CAPILLARY
Glucose-Capillary: 176 mg/dL — ABNORMAL HIGH (ref 70–99)
Glucose-Capillary: 112 mg/dL — ABNORMAL HIGH (ref 70–99)
Glucose-Capillary: 237 mg/dL — ABNORMAL HIGH (ref 70–99)
Glucose-Capillary: 129 mg/dL — ABNORMAL HIGH (ref 70–99)

## 2023-05-31 MED ORDER — METHYLPHENIDATE HCL 5 MG PO TABS
5.0000 mg | ORAL_TABLET | Freq: Two times a day (BID) | ORAL | Status: DC
  Administered 2023-05-31 – 2023-06-07 (×14): 5 mg via ORAL
  Filled 2023-05-31 (×15): qty 1

## 2023-05-31 MED ORDER — METHYLPHENIDATE HCL 5 MG PO TABS: 5.0000 mg | ORAL_TABLET | Freq: Two times a day (BID) | ORAL | Status: DC

## 2023-05-31 NOTE — Progress Notes (Addendum)
Patient ID: NICKALOS PETERSEN, male   DOB: 1946-10-15, 77 y.o.   MRN: [id number]  SW spoke with pt wife to follow-up about  family edu, and discuss if she still prefers SNF. Continues to think more rehab is needed as she is not able to physically assist him. SW discussed in length SNF placement process. SW reiterated placement is at the discretion of insurance. Discussed preference- [facility] (no beds), and [facility] ([facility] . [facility] will [facility] to see if [facility] available. She is aware if d/c to home [facility] will only come once a week.   SW left message for [name]/Admissions with [facility] to discuss bed availability and waiting on follow-up.  *bed offer extended. SW will submit insurance auth, and will follow-up on Monday.   SW spoke with [facility] [phone] or [phone] [id number]) to submit SNF auth request. SW faxed over requested clinicals.   SW spoke with pt wife [name] to inform on above.   [name], MSW, LCSWA Office: [phone] Cell: [phone] Fax: [phone]

## 2023-05-31 NOTE — Progress Notes (Signed)
Speech Language Pathology Daily Session Note  Patient Details  Name: [name] [name] MRN: [id number] Date of Birth: 12/05/1945  Today's Date: 05/31/2023  Session 1: SLP Individual Time: 1001-1059 SLP Individual Time Calculation (min): 58 min  Session 2: SLP Individual Time: 6295-2841 SLP Individual Time Calculation (min): 29 min  Short Term Goals: Week 1: SLP Short Term Goal 1 (Week 1): Patient will demonstrate sustained attention to therapeutic task for >61minutes with min verbal cues. SLP Short Term Goal 2 (Week 1): Patient will recall 3 external memory strategies for use at home with min verbal cues. SLP Short Term Goal 3 (Week 1): Patient will demonstrate increased awareness of impairments by self-correcting on therapeutic task with minA multimodal cues.  Skilled Therapeutic Interventions:   Session 1: SLP conducted skilled therapy session targeting cognitive retraining (memory, sustained attention, awareness of deficits). SLP guided patient through creation of a memory log to assist with recall of daily. Patient required totalA to recall events from previous day's therapy and details from conversation with MD during speech therapy session. Patient exhibited improved initiation for writing items compared to previous session. Utilizing room calendar, patient independently recalled date and discharge date but required assistance to locate day of the week. SLP and patient reviewed in detail targets of occupational therapy and physical therapy during CIR stay, as patient does not recall participating in any therapy sessions since arrival. SLP encouraged patient to utilize memory log to orient to tasks completed daily. Patient was left in lowered bed with call bell in reach and bed alarm set. SLP will continue to target goals per plan of care.   Session 2: SLP conducted skilled therapy session targeting family education re: cognitive retraining and supports needed for transfer to next venue of  care. Wife present for family education and had no questions for therapist throughout session. SLP provided information re: continued cognitive training strategies to implement including continuation of memory log and potential contributors to trouble initiating movement. Patient reports that when he gets home, he does not want to do anything and implied he is happy watching television all day as it "does all the work for him." SLP encouraged patient to work towards improving performance of ADLs to ease caregiver burden where possible. Patient was left in chair with call bell in reach and chair alarm set. Patient and family education ongoing.      Pain Pain Assessment Pain Scale: 0-10 Pain Score: 0-No pain  Therapy/Group: Individual Therapy  Jeannie Done, M.A., CCC-SLP  Yetta Barre 05/31/2023, 10:44 AM

## 2023-05-31 NOTE — Plan of Care (Signed)
  Problem: Consults Goal: RH BRAIN INJURY PATIENT EDUCATION Description: Description: See Patient Education module for eduction specifics Outcome: Progressing   Problem: RH BOWEL ELIMINATION Goal: RH STG MANAGE BOWEL WITH ASSISTANCE Description: STG Manage Bowel with mod I/toileting Assistance. Outcome: Progressing Goal: RH STG MANAGE BOWEL W/MEDICATION W/ASSISTANCE Description: STG Manage Bowel with Medication with mod I  Assistance. Outcome: Progressing   Problem: RH BLADDER ELIMINATION Goal: RH STG MANAGE BLADDER WITH ASSISTANCE Description: STG Manage Bladder With toileting Assistance Outcome: Progressing Goal: RH STG MANAGE BLADDER WITH MEDICATION WITH ASSISTANCE Description: STG Manage Bladder With Medication With mod I Assistance. Outcome: Progressing   Problem: RH SAFETY Goal: RH STG ADHERE TO SAFETY PRECAUTIONS W/ASSISTANCE/DEVICE Description: STG Adhere to Safety Precautions With cues Assistance/Device. Outcome: Progressing   Problem: RH COGNITION-NURSING Goal: RH STG USES MEMORY AIDS/STRATEGIES W/ASSIST TO PROBLEM SOLVE Description: STG Uses Memory Aids/Strategies With cues Assistance to Problem Solve. Outcome: Progressing   Problem: RH PAIN MANAGEMENT Goal: RH STG PAIN MANAGED AT OR BELOW PT'S PAIN GOAL Description: < 4 with prns Outcome: Progressing   Problem: RH KNOWLEDGE DEFICIT BRAIN INJURY Goal: RH STG INCREASE KNOWLEDGE OF SELF CARE AFTER BRAIN INJURY Description: Patient and family will be able to manage care at discharge , medications and dietary modification using educational resources independently Outcome: Progressing   Problem: Education: Goal: Knowledge of the prescribed therapeutic regimen will improve Outcome: Progressing   Problem: Clinical Measurements: Goal: Usual level of consciousness will be regained or maintained. Outcome: Progressing Goal: Neurologic status will improve Outcome: Progressing Goal: Ability to maintain intracranial  pressure will improve Outcome: Progressing   Problem: Skin Integrity: Goal: Demonstration of wound healing without infection will improve Outcome: Progressing

## 2023-05-31 NOTE — Progress Notes (Signed)
PROGRESS NOTE   Subjective/Complaints:  No acute complaints. No events overnight. Patient with no further staring episodes or signs of seizure since stopping Keppra.  Discussed starting stimulant medication for concentration and memory today; patient is agreeable.  Informed team as family training is going on today, in case Ritalin escalates behaviors family is not surprised. vitals stable, POS excellent.  LBM 7/24, continent b/b with episodes of incontinence , does not use call bell.  Patient endorses since his bifrontal resection he has had issues with urge incontinence, and does not always call nursing when he has an episode.  ROS: Denies fevers, chills, N/V, abdominal pain, constipation, diarrhea, SOB, cough, chest pain, new weakness or paraesthesias.    Objective:   EEG adult  Result Date: 05/29/2023 Charlsie Quest, MD     05/29/2023  5:51 PM Patient Name: [name] [name] MRN: [id number] Epilepsy Attending: Charlsie Quest Referring Physician/Provider: Angelina Sheriff, DO Date: 05/29/2023 Duration: 23.38 mins Patient history: 77 Y/O male with history of with H/O CAD/CABG HTN DM HLD as well as colon cancer and bifrontal craniotomy resection for meningioma 2 years ago. EEG to evaluate for seizure Level of alertness: Awake, asleep AEDs during EEG study: LEV Technical aspects: This EEG study was done with scalp electrodes positioned according to the 10-20 International system of electrode placement. Electrical activity was reviewed with band pass filter of 1-70Hz , sensitivity of 7 uV/mm, display speed of 64mm/sec with a 60Hz  notched filter applied as appropriate. EEG data were recorded continuously and digitally stored.  Video monitoring was available and reviewed as appropriate. Description: The posterior dominant rhythm consists of 8 Hz activity of moderate voltage (25-35 uV) seen predominantly in posterior head regions, symmetric and  reactive to eye opening and eye closing. Sleep was characterized by vertex waves, sleep spindles (12 to 14 Hz), maximal frontocentral region. EEG showed intermittent generalized and maximal bifrontal 3 to 6 Hz theta-delta slowing. Physiologic photic driving was not seen during photic stimulation.  Hyperventilation was not performed.   ABNORMALITY - Intermittent slow, generalized and maximal bifrontal IMPRESSION: This study is suggestive of cortical dysfunction arising from bifrontal region as well as mild diffuse encephalopathy. No seizures or epileptiform discharges were seen throughout the recording. Priyanka [name]   No results for input(s): "WBC", "HGB", "HCT", "PLT" in the last 72 hours.  Recent Labs    05/29/23 0717  NA 134*  K 3.6  CL 98  CO2 30  GLUCOSE 112*  BUN 23  CREATININE 1.16  CALCIUM 8.8*    Intake/Output Summary (Last 24 hours) at 05/31/2023 0918 Last data filed at 05/31/2023 0825 Gross per 24 hour  Intake 657 ml  Output --  Net 657 ml         Physical Exam: Vital Signs Blood pressure 103/61, pulse 77, temperature 98 F (36.7 C), resp. rate 18, height 5\' 6"  (1.676 m), weight 81.2 kg, SpO2 96%.    Constitutional: No distress . Vital signs reviewed.  Sitting upright, working with SLP. HEENT: large frontal incision.  EOMI, oral membranes moist Neck: supple Cardiovascular: RRR without murmur. No JVD    Respiratory/Chest: CTA Bilaterally without wheezes or rales. Normal effort  GI/Abdomen: BS +, non-tender, non-distended Ext: no clubbing, cyanosis, or edema Psych: flat, slow to engage - improving Skin: +excoriations on frontal incisions Neurologic:   Pt is alert and oriented to person, place, time, and event. Follows basic commands, normal language, speech clear.  Slow to process. Needs cues to engage.  - improved  Motor: Antigravity and against resistance in all 4 extremities equally Sensory exam normal for light touch and pain in all 4 limbs.  No limb  ataxia or cerebellar signs.  No abnormal tone appreciated.       Assessment/Plan: 1. Functional deficits which require 3+ hours per day of interdisciplinary therapy in a comprehensive inpatient rehab setting. Physiatrist is providing close team supervision and 24 hour management of active medical problems listed below. Physiatrist and rehab team continue to assess barriers to discharge/monitor patient progress toward functional and medical goals  Care Tool:  Bathing    Body parts bathed by patient: Right arm, Left arm, Chest, Abdomen, Front perineal area, Right upper leg, Buttocks, Left upper leg, Right lower leg, Left lower leg, Face         Bathing assist Assist Level: Minimal Assistance - Patient > 75%     Upper Body Dressing/Undressing Upper body dressing   What is the patient wearing?: Pull over shirt    Upper body assist Assist Level: Supervision/Verbal cueing    Lower Body Dressing/Undressing Lower body dressing      What is the patient wearing?: Pants     Lower body assist Assist for lower body dressing: Minimal Assistance - Patient > 75%     Toileting Toileting    Toileting assist Assist for toileting: Minimal Assistance - Patient > 75%     Transfers Chair/bed transfer  Transfers assist     Chair/bed transfer assist level: Minimal Assistance - Patient > 75%     Locomotion Ambulation   Ambulation assist      Assist level: Minimal Assistance - Patient > 75% Assistive device: No Device Max distance: 150'   Walk 10 feet activity   Assist     Assist level: Minimal Assistance - Patient > 75%     Walk 50 feet activity   Assist    Assist level: Minimal Assistance - Patient > 75% Assistive device: No Device    Walk 150 feet activity   Assist    Assist level: Minimal Assistance - Patient > 75%      Walk 10 feet on uneven surface  activity   Assist     Assist level: Minimal Assistance - Patient > 75%      Wheelchair     Assist Is the patient using a wheelchair?: Yes Type of Wheelchair: Manual    Wheelchair assist level: Dependent - Patient 0% Max wheelchair distance: 150'    Wheelchair 50 feet with 2 turns activity    Assist        Assist Level: Dependent - Patient 0%   Wheelchair 150 feet activity     Assist      Assist Level: Dependent - Patient 0%   Blood pressure 103/61, pulse 77, temperature 98 F (36.7 C), resp. rate 18, height 5\' 6"  (1.676 m), weight 81.2 kg, SpO2 96%.    Medical Problem List and Plan: 1. Functional deficits secondary to Large bi frontal axial tumor suspect meningioma.S/O cranio resection 7/2 with H/O meningioma two years ago.Decadron taper             -patient may  shower             -  ELOS/Goals: 6-9 days, supervision goals with PT, OT, sup-min assist with cognition and behavior - 7/30  -Continue CIR therapies including PT, OT, and SLP. Team continues to work on motivation and encouragement.   2.  Antithrombotics: -DVT/anticoagulation:  Mechanical: Antiembolism stockings, thigh (TED hose) Bilateral lower extremities             -antiplatelet therapy: ASA 81 mg daily  3. Pain Management: Hydrocodone as needed  4. Mood/Behavior/Sleep:               -behavior improving but still unwilling to participate in therapies at times.  -observe for increased agitation/confusion. -maximize sleep/wake cycle              -antipsychotic agents: seroquel 25mg  every day prn agitation/sleep             -trazodone 50mg  at bedtime---will schedule tonight.              -sleep chart--sleep has been inconsistent - >7 hours 7/23-24; good  5. Neuropsych/cognition: This patient is not capable of making decisions on her own behalf.             -namenda 5mg  bid per home dosing  -7/23: D/w neurology regarding ? Seizures and need for EEG prior to starting stimulant (likely modafinil vs. Ritalin) d/t possible seizure risk   - wife and patient would benefit  from neuropsych consult     - pending on how he does with Keppra wean, add Ritalin in AM 5 mg BID 7-25-tolerated Keppra wean, adding Ritalin  6. Skin/Wound Care: Routine skin checks    - 7/24: Patient scratching incision sites; educated on need to avoid infection, add benadryl cream PRN  7. Fluids/Electrolytes/Nutrition: pt is dry.Marland KitchenMarland Kitchenpush po fluids -recheck bmet 7/23   8 Seizure prophylaxis. keppra 500 mg BID for 14 days post-op - Wife has reported staring spells post-op; ?absence seizures, however some last a long time (>30 min) and none witnessed inpatient -no similar activity reported while inpatient  - 7/23: D/w neurology, no consult needed, ordered EEG and f/u OP with [facility] for 72 hour study after discharge if no findings - 7/24: EEG no epileptiform discharges, generalized findings in frontal area. D/w wife. DC keppra and monitor.    9.Hypothyroid. Synthroid  10.DM.Semglee 60 unit daily and Glucophage 1000 mg bid  -SSI/Monitor while on decadron  - 7/20: BG low this evening  50s; reduce semglee to 50 U   - 7/21: BG increased this evening; increase semglee back to 55 U; if ongoing low AM and high PM might consider splitting dose  -7/22 CBG's better today with recent changes--follow for pattern   - 7/23: AM hypoglycemia again - moving target while weaning steroids - will go back to 50U semglee and allow highs to avoid lows   - 7/24-25: [facility] better controlled with steroid wean; monitor Recent Labs    05/30/23 1132 05/30/23 1615 05/31/23 0609  GLUCAP 102* 171* 176*     11.GERD.Protonix 12.Hyperlipidemia.Lipitor 13.HTN.Lopressor 25 mg BID ` -bp controlled at present      05/31/2023    5:32 AM 05/30/2023    8:01 PM 05/30/2023   12:54 PM  Vitals with BMI  Systolic 103 124 027  Diastolic 61 70 77  Pulse 77 91 72   14. Steroids for cerebral edema. Resumed decadron taper on admission to hospital 7/15.  - 7/15-17: 2 mg total) by mouth Q6H - 7/18-21: 2 tablets (2 mg  total) - 2 (two) times daily with  a meal for 3 days - 7/22-24: tablet (1 mg total) 2 (two) times daily with a meal for 3 days - 7/25-27: 1 tablet (1 mg total) daily with breakfast for 3 days.   15: Urinary incontinence/urgency-likely behavioral component as patient does not notify nursing or staff when he voids -PVRs today, if no improved continence with starting Ritalin for attention/memory, and if PVRs remain low, may start oxybutynin 5 mg twice daily in a.m.  LOS: 6 days A FACE TO FACE EVALUATION WAS PERFORMED  Angelina Sheriff 05/31/2023, 9:18 AM

## 2023-05-31 NOTE — Progress Notes (Signed)
Nursing education provided today with patient and wife.    Tilden Dome, LPN

## 2023-05-31 NOTE — Progress Notes (Signed)
Occupational Therapy Session Note  Patient Details  Name: [name] [name] MRN: [id number] Date of Birth: 1946/09/12  Today's Date: 05/31/2023 OT Individual Time: 3295-1884 OT Individual Time Calculation (min): 45 min    Short Term Goals: Week 1:  OT Short Term Goal 1 (Week 1): LTG=STG 2/2 ELOS    Skilled Therapeutic Interventions/Progress Updates:   Pt received in the bathroom taking BM.  NT approached OT and stated that she could not get him to get off of the toilet.  OT/OTS entered room to find patient standing up from toilet.  Pt completed toileting CGA with mod cues and increased time to initiate. Wife present for family education.  Family ed had focus on pts poor initiation and how this could negatively affect his abilities at home.  Wife did express that she did not think she could manage his needs at this time at home. Pt had no c/o pain and was agreeable to OT treatment session.  Functional mobility to dayroom ~181ft with CGA and no AD.  Pt able to complete dynamic standing balance activity using balance mat and card matching game on vertical mirror to help increase activity tolerance, balance and completion of dual tasks.  Pt able to complete activity with CGA for safety while on mat and required no cueing for matching the correct cards.  Pt required increased time for initiation of activity and mod verbal cues for attention.  Pt able to complete functional activity locating weighted balls and placing them in the hamper which pt had to balance and carry around dayroom.  Pt able to retrieve 8/8 balls with no cuing for retrieval.  Functional mobility back to room ~157ft with no AD.  Pt left sitting in wheelchair with chair alarm set, call light within reach and all needs met.   Therapy Documentation Precautions:  Precautions Precautions: Fall Restrictions Weight Bearing Restrictions: No     Therapy/Group: Individual Therapy  Liam Graham 05/31/2023, 3:24 PM

## 2023-05-31 NOTE — Progress Notes (Signed)
Physical Therapy Session Note  Patient Details  Name: [name] [name] MRN: [id number] Date of Birth: 01/01/1946  Today's Date: 05/31/2023 PT Individual Time: 1430-1540 PT Individual Time Calculation (min): 70 min   Short Term Goals: Week 1:  PT Short Term Goal 1 (Week 1): STGs = LTGs  Skilled Therapeutic Interventions/Progress Updates:  Patient greeted sitting upright in wheelchair in his room and agreeable to PT treatment session. Patient wheeled to the day room with spouse present for time management and energy conservation. While in the day room, patient participated in a dance activity in order to improve overall mood/affect, engagement in therapy session, interaction with other patients and improve overall ability to follow one-step commands for improved functional mobility. Patient had a smile on his face throughout the dance activity and was singing and dancing along at times. At the end of the activity, patient was encouraged to ambulated back to his room- With increased time and max encouragement from his spouse and therapist, patient was able to stand from his wheelchair with supv and without the use of an AD. Patient gait trained back to his room with CGA/Supv for safety- Patient was able to locate his room without any VC. Once in his room, patient cleaned off his tray table and toileted all with supv. Patient left supine in bed with spouse and RN present, bed alarm on, call bell within reach and all needs met.    Therapy Documentation Precautions:  Precautions Precautions: Fall Restrictions Weight Bearing Restrictions: No  Pain: No/Denies pain.    Therapy/Group: Individual Therapy  Lesleyanne Politte 05/31/2023, 7:50 AM

## 2023-05-31 NOTE — Progress Notes (Signed)
Met with patient, wife not in room.  NT in room with personal care. Much better time wise with turning and lifting bottom. Reports that was only checking blood sugar at home and not taking insulin at home, will verify this with wife.  According to medications PTA he was taking 60 units of Lantus daily at home.  All needs met, NT still in room.

## 2023-06-01 LAB — GLUCOSE, CAPILLARY
Glucose-Capillary: 63 mg/dL — ABNORMAL LOW (ref 70–99)
Glucose-Capillary: 75 mg/dL (ref 70–99)
Glucose-Capillary: 134 mg/dL — ABNORMAL HIGH (ref 70–99)
Glucose-Capillary: 180 mg/dL — ABNORMAL HIGH (ref 70–99)
Glucose-Capillary: 193 mg/dL — ABNORMAL HIGH (ref 70–99)

## 2023-06-01 MED ORDER — CALCIUM CARBONATE ANTACID 500 MG PO CHEW
1.0000 | CHEWABLE_TABLET | Freq: Four times a day (QID) | ORAL | Status: DC | PRN
  Administered 2023-06-01 – 2023-06-02 (×2): 200 mg via ORAL
  Filled 2023-06-01 (×2): qty 1

## 2023-06-01 MED ORDER — ALUM & MAG HYDROXIDE-SIMETH 200-200-20 MG/5ML PO SUSP: 15.0000 mL | Freq: Four times a day (QID) | ORAL | Status: DC | PRN

## 2023-06-01 MED ORDER — INSULIN GLARGINE-YFGN 100 UNIT/ML ~~LOC~~ SOLN
40.0000 [IU] | Freq: Every day | SUBCUTANEOUS | Status: AC
  Administered 2023-06-02: 40 [IU] via SUBCUTANEOUS
  Filled 2023-06-01: qty 0.4

## 2023-06-01 MED ORDER — VITAMIN D 25 MCG (1000 UNIT) PO TABS
2000.0000 [IU] | ORAL_TABLET | Freq: Every day | ORAL | Status: DC
  Administered 2023-06-01 – 2023-06-07 (×7): 2000 [IU] via ORAL
  Filled 2023-06-01 (×8): qty 2

## 2023-06-01 NOTE — Significant Event (Signed)
Hypoglycemic Event  CBG: 63  Treatment: 8 oz juice/soda  Symptoms: None  Follow-up CBG: Time:0636 CBG Result:75  Possible Reasons for Event: Unknown  Comments/MD notified:Dan Aguilla    Craig Guess

## 2023-06-01 NOTE — Progress Notes (Signed)
Occupational Therapy Weekly Progress Note  Patient Details  Name: [name] [name] MRN: [id number] Date of Birth: 1946/02/20  Beginning of progress report period: May 26, 2023 End of progress report period: June 01, 2023  Today's Date: 06/01/2023 OT Individual Time: 8469-6295 OT Individual Time Calculation (min): 73 min   Patient is making slow but steady progress towards OT goals. He is at an overall close supervision/CGA level for all BADL tasks. His biggest barrier is his decreased awareness and poor initiation. He requires extended time to initiate all sit<>stands and is unaware of incontinent episodes. Continue current POC.   Patient continues to demonstrate the following deficits: muscle weakness, decreased initiation, decreased attention, decreased safety awareness, and decreased memory, and decreased standing balance and decreased balance strategies and therefore will continue to benefit from skilled OT intervention to enhance overall performance with BADL and Reduce care partner burden.  Patient progressing toward long term goals..  Continue plan of care.  OT Short Term Goals Week 1:  OT Short Term Goal 1 (Week 1): LTG=STG 2/2 ELOS Week 2:  OT Short Term Goal 1 (Week 2): LTG=STG 2/2 ELOS  Skilled Therapeutic Interventions/Progress Updates:    Pt greeted sitting in recliner watching TV. Pt had already performed BADL's in earlier PT session. Pt needed ~25  minutes to initiate a stand from recliner. OT turned off the TV to limit distractions and provided multimodal cues for pt to initiate. Eventually, pt stood with supervision and ambulated to therapy gym without AD and supervision/CGA. Cognitive retraining with focus on memory and problem solving while playing [employer]. Pt was able to problem solve, notice patterns, and place cards without cues from OT. Pt enjoyed card game and showed a more emotion than usual. Pt ambulated back to room and was left seated in recliner with alarm belt on, call  bell in reach, and needs met.   Therapy Documentation Precautions:  Precautions Precautions: Fall Restrictions Weight Bearing Restrictions: No Pain:  Denies pain   Therapy/Group: Individual Therapy  Mal Amabile 06/01/2023, 12:36 PM

## 2023-06-01 NOTE — Discharge Summary (Signed)
Physician Discharge Summary  Patient ID: [name] [name] MRN: [id number] DOB/AGE: [birth year] 77 y.o.  Admit date: 05/25/2023 Discharge date:  Discharge Diagnoses:  Principal Problem:   Meningioma [facility]) Mood stabilization Seizure prophylaxis Hypothyroidism Diabetes mellitus GERD Hyperlipidemia Hypertension  Discharged Condition: Stable  Significant Diagnostic Studies: EEG adult  Result Date: June 06, 2023 Charlsie Quest, MD     06/06/23  5:51 PM Patient Name: [name] [name] MRN: [id number] Epilepsy Attending: Charlsie Quest Referring Physician/Provider: Angelina Sheriff, [name] [name] Date: 06/06/2023 Duration: 23.38 mins Patient history: 77 Y/O male with history of with H/O CAD/CABG HTN DM HLD as well as colon cancer and bifrontal craniotomy resection for meningioma 2 years ago. EEG to evaluate for seizure Level of alertness: Awake, asleep AEDs during EEG study: LEV Technical aspects: This EEG study was done with scalp electrodes positioned according to the 10-20 International system of electrode placement. Electrical activity was reviewed with band pass filter of 1-70Hz , sensitivity of 7 uV/mm, display speed of 71mm/sec with a 60Hz  notched filter applied as appropriate. EEG data were recorded continuously and digitally stored.  Video monitoring was available and reviewed as appropriate. Description: The posterior dominant rhythm consists of 8 Hz activity of moderate voltage (25-35 uV) seen predominantly in posterior head regions, symmetric and reactive to eye opening and eye closing. Sleep was characterized by vertex waves, sleep spindles (12 to 14 Hz), maximal frontocentral region. EEG showed intermittent generalized and maximal bifrontal 3 to 6 Hz theta-delta slowing. Physiologic photic driving was not seen during photic stimulation.  Hyperventilation was not performed.   ABNORMALITY - Intermittent slow, generalized and maximal bifrontal IMPRESSION: This study is suggestive of cortical dysfunction arising  from bifrontal region as well as mild diffuse encephalopathy. No seizures or epileptiform discharges were seen throughout the recording. Charlsie Quest   CT Head Wo Contrast  Addendum Date: 05/21/2023   ADDENDUM REPORT: 05/21/2023 19:10 ADDENDUM: These results were called by telephone at the time of interpretation on 05/21/2023 at 6:17 pm to provider Dr. Criss Alvine, Who verbally acknowledged these results. Electronically Signed   By: [name] Loge D.O.   On: 05/21/2023 19:10   Result Date: 05/21/2023 CLINICAL DATA:  Mental status change, unknown cause. EXAM: CT HEAD WITHOUT CONTRAST TECHNIQUE: Contiguous axial images were obtained from the base of the skull through the vertex without intravenous contrast. RADIATION DOSE REDUCTION: This exam was performed according to the departmental dose-optimization program which includes automated exposure control, adjustment of the mA and/or kV according to patient size and/or use of iterative reconstruction technique. COMPARISON:  Prior brain MRI examinations 05/09/2023 and earlier. FINDINGS: Brain: Interval evolution of postoperative changes from recent prior frontal mass resection. Persistent epidural collection deep to the bifrontal bone flap containing pneumocephalus, fluid and postoperative blood products. This collection has increased in size from the prior brain MRI of 05/09/2023, now measuring up to 2.8 cm in thickness (previously 2.3 cm when remeasured on prior). Mass effect upon the underlying frontal lobes (left greater than right) There are foci of parenchymal hemorrhage within the anterior frontal lobes bilaterally (measuring up to 10 mm), which were not definitively present on the prior brain MRI of 05/09/2023 (for instance as seen on series 5, image 51) (series 4, image 21) Parenchymal edema within the anterior frontal lobes. On the left, this is similar to the prior brain MRI of 05/09/2023. The edema within the anterior right frontal lobe is new from the  prior MRI. Persistent effacement of the frontal horn of the left lateral  ventricle. New effacement of the frontal horn of the right lateral ventricle. No extra-axial fluid collection. No midline shift. Vascular: No hyperdense vessel. Skull: Bifrontal cranioplasty with overlying scalp staples. Sinuses/Orbits: No mass or acute finding within the imaged orbits. Minimal mucosal thickening versus small mucous retention cyst within the left maxillary sinus. Near complete opacification of the left frontal sinus. IMPRESSION: 1. Interval evolution of postoperative changes from recent prior frontal mass resection. 2. An epidural collection deep to the bifrontal cranioplasty has increased in size since the brain MRI of 05/09/2023, now measuring up to 2.8 cm in thickness (previously 2.3 cm). Mass effect on the underlying frontal lobes (left greater than right). Underlying parenchymal edema within the anterior left frontal lobe is similar to the prior MRI. Underlying parenchymal edema within the anterior right frontal lobe is new. Persistent partial effacement of the frontal horn of the left lateral ventricle. Partial effacement of the frontal horn of the right lateral ventricle, new. 3. There are foci of parenchymal hemorrhage (measuring up to 10 mm) within the anterior frontal lobes, bilaterally. These foci were not definitively present on the prior MRI. 4. Complete opacification of the left frontal sinus, similar to the prior exam. Electronically Signed: By: [name] Loge D.O. On: 05/21/2023 18:14   MR BRAIN W WO CONTRAST  Result Date: 05/13/2023 CLINICAL DATA:  Cerebral meningioma, stereotactic imaging protocol EXAM: MRI HEAD WITHOUT AND WITH CONTRAST TECHNIQUE: Multiplanar, multiecho pulse sequences of the brain and surrounding structures were obtained without and with intravenous contrast. CONTRAST:  8mL GADAVIST GADOBUTROL 1 MMOL/ML IV SOLN COMPARISON:  04/19/2023 FINDINGS: Brain: Known dural-based mass centered on the  parasagittal and anterior left frontal convexity measuring 4.7 x 3.8 x 5.4 cm with mass effect on the left frontal lobe which is edematous to a moderate degree. There is growth across the midline with the right parafalcine nodule measuring 1.1 x 1.8 x 2.2 cm. The intervening superior sagittal sinus is occluded over the length of the mass, ~ 6 cm. Extensive dural tail/dural thickening emanating on both sides. No indication of parenchymal invasion. No incidental infarct, hemorrhage, hydrocephalus, or collection. Vascular: Major flow voids and vascular enhancements are preserved Skull and upper cervical spine: No noted intra osseous component Sinuses/Orbits: Negative IMPRESSION: Treatment planning scan redemonstrates a left more than right anterior parafalcine meningioma with superior sagittal sinus occlusion, cerebral mass effect and vasogenic edema. Electronically Signed   By: [name] Pea M.D.   On: 05/13/2023 04:03   MR BRAIN W WO CONTRAST  Result Date: 05/09/2023 CLINICAL DATA:  Postop craniotomy EXAM: MRI HEAD WITHOUT AND WITH CONTRAST TECHNIQUE: Multiplanar, multiecho pulse sequences of the brain and surrounding structures were obtained without and with intravenous contrast. CONTRAST:  8mL GADAVIST GADOBUTROL 1 MMOL/ML IV SOLN COMPARISON:  05/06/2023 FINDINGS: Brain: Resected left frontal mass. Mainly linear enhancement along the surface of the frontal lobes is likely postoperative, greatest level of thickening at the parasagittal left frontal convexity measuring 8 mm in thickness, small volume tumor residual in this area is possible but not favored. Epidural blood products beneath the bifrontal bone flap measuring up to 14 mm in thickness. Thin subdural collections seen along the left cerebral convexity and around the right more than left cerebellar hemisphere, up to 5 mm in thickness along the left cerebral convexity. The dura is diffusely thickened and there is hyperenhancing and congested appearing  epidural space at the level of the upper cervical spine. Findings suggest intracranial hypotension although no brain sagging. Common degree of edema  and small volume restricted diffusion in the left frontal lobe at the level of the surgical site. No hydrocephalus or midline shift Vascular: The anterior superior sagittal sinus was occluded by the mass. Patent superior sagittal sinus on preoperative study remains patent. Skull and upper cervical spine: Unremarkable bifrontal craniotomy. Sinuses/Orbits: Negative.  No orbital collection. IMPRESSION: 1. Resected frontal mass without convincing residual. Enhancement along the surface of the frontal lobes is considered postoperative if stable or regressed on follow-up. 2. Generalized thickening of the dura and upper cervical epidural venous plexus suggesting intracranial hypotension, although no brain sagging. Aside from the previously occluded superior sagittal sinus, the dural sinuses remain patent. 3. Small subdural collections are seen along the left cerebral convexity and in the posterior fossa without significant mass effect. Epidural collection beneath the bifrontal bone flap causes mild frontal lobe mass effect on the right. Electronically Signed   By: [name] Pea M.D.   On: 05/09/2023 05:04    Labs:  Basic Metabolic Panel: Recent Labs  Lab 05/26/23 1117 05/28/23 0731 05/29/23 0717  NA 134* 134* 134*  K 4.3 4.2 3.6  CL 94* 99 98  CO2 27 28 30   GLUCOSE 178* 171* 112*  BUN 21 24* 23  CREATININE 1.13 1.11 1.16  CALCIUM 9.3 8.7* 8.8*  MG 1.7  --   --   PHOS 2.4*  --   --     CBC: Recent Labs  Lab 05/26/23 1117 05/28/23 0731  WBC 10.9* 10.3  NEUTROABS 9.5* 8.4*  HGB 13.8 13.0  HCT 41.6 39.6  MCV 99.8 97.1  PLT 179 153    CBG: Recent Labs  Lab 05/30/23 1615 05/31/23 0609 05/31/23 1124 05/31/23 1649 05/31/23 2044  GLUCAP 171* 176* 112* 237* 129*   Family history.  Father with diabetes mellitus as well as myocardial  infarction.  Mother with diabetes mellitus.  Denies any colon cancer or esophageal cancer or rectal cancer  Brief HPI:   [name] [name] is a 77 y.o. right-handed male with history of CAD/CABG hypertension diabetes mellitus hyperlipidemia as well as colon cancer and bifrontal craniotomy resection for meningioma 2 years ago.  Lives with spouse independent prior to admission wife does assist with some ADLs.  Presented 05/08/2023 with altered mental status and mood changes.  CT imaging showed large bifrontal extra-axial tumor with mass effect.  Underwent planned stereotactic craniotomy resection for tumor likely meningioma 05/08/2023 per Dr. Jake Samples.  Decadron taper as indicated.  Keppra added for seizure prophylaxis.  Low-dose aspirin resumed for history of CAD.  Tolerating a regular diet.  Therapy evaluations completed due to patient's limited functional mobility was admitted for comprehensive rehab program.   Hospital Course: [name] [name] was admitted to rehab 05/25/2023 for inpatient therapies to consist of PT, ST and OT at least three hours five days a week. Past admission physiatrist, therapy team and rehab RN have worked together to provide customized collaborative inpatient rehab.  Pertaining to patient's large bifrontal axial tumor suspected meningioma status postcraniotomy resection 05/08/2023 as well as history of meningioma 2 years ago.  Followed by neurosurgery.  Decadron taper is indicated.  He was cleared to resume low-dose aspirin for history of CAD no chest pain or shortness of breath.  Mood stabilization with the initiation of Ritalin to help patient attend to task and improve focus.  He continued on Namenda as prior to admission.  Keppra for seizure prophylaxis completing course no seizure activity.  EEG negative.  Blood sugars monitored on insulin therapy  as well as Glucophage as directed with some variations due to Decadron.  Full diabetic teaching completed.  Blood pressure controlled on Lopressor and  would need outpatient follow-up.  Lipitor ongoing for hyperlipidemia.  Patient did have some urinary incontinence urgency likely behavioral component maintained on Toviaz   Blood pressures were monitored on TID basis and soft and monitored  Diabetes has been monitored with ac/hs CBG checks and SSI was use prn for tighter BS control.    Rehab course: During patient's stay in rehab weekly team conferences were held to monitor patient's progress, set goals and discuss barriers to discharge. At admission, patient required minimal assist 160 feet rolling walker minimal guard sit to stand  Physical exam.  Blood pressure 127/65 pulse 61 temperature 97.7 respiration 16 oxygen saturation is 98% room air Constitutional.  No acute distress HEENT Head.  Normocephalic and atraumatic.  Long bifrontal incision clean and dry Eyes.  Pupils round and reactive to light Neck.  Supple nontender no JVD without thyromegaly Cardiac regular rate and rhythm without any extra sounds or murmur heard Abdomen.  Soft nontender positive bowel sounds without rebound Respiratory effort normal no respiratory distress without wheeze Neurologic.  Alert and oriented to person.  He can name the month and appropriate year followed basic commands.  He was somewhat distracted sometimes needing cues to stay on task.  Some delay in processing.  Motor 4+/5 bilateral upper extremities, lower extremities 4 -/5 hip flexors, 4/5 knee extension, 5/5 ankle dorsi plantarflexion.  He/She  has had improvement in activity tolerance, balance, postural control as well as ability to compensate for deficits. He/She has had improvement in functional use RUE/LUE  and RLE/LLE as well as improvement in awareness.  Patient ambulates to his room with contact-guard supervision for safety.  He was able to locate his room without verbal cues.  Patient completed toileting contact-guard with moderate cues and increased time to initiate.  Patient able to complete  all activities contact-guard for safety.  Speech therapy conducted skilled therapy sessions targeting cognitive retraining memory sustained attention.  Patient required assist to recall events from previous days therapy and details from conversations with MD.  Utilizing room calendar patient independently recalled date discharge date but required assistance to locate day of the week.  Full family teaching completed wife considering home versus skilled nursing facility       Disposition: SNF    Diet: Diabetic diet  Special Instructions: No driving smoking or alcohol  Medications at discharge. 1.  Tylenol as needed 2.  Aspirin 81 mg p.o. daily 3.  Lipitor 10 mg p.o. nightly 4.  Vitamin D 2000 units p.o. daily 5.  Vitamin B12 1000 mcg p.o. daily 6.  Colace 100 mg p.o. twice daily 7.  Toviaz 4 mg p.o. daily 8.  Hydrocodone 1 to 2 tablets every 4 hours as needed pain 9.  Lantus insulin 50 units daily 10.  Synthroid 75 mcg p.o. every morning 11.  Namenda 5 mg p.o. twice daily 12.  Glucophage 1000 mg p.o. twice daily 13.  Ritalin 5 mg p.o. twice daily 14.  Lopressor 25 mg p.o. daily 15.  Multivitamin daily 16.  Nitroglycerin as needed chest pain 17.  Protonix 40 mg p.o. daily 18.  Trazodone 50 mg p.o. nightly  30-35 minutes were spent completing discharge summary and discharge planning     Follow-up Information     [name] Sheriff, [name] [name] Follow up.   Specialty: Physical Medicine and Rehabilitation Why: Office to call for  appointment Contact information: [address] [place] [address] [phone]         Dawley, Kendell Bane C, [name] [name] Follow up.   Why: Call for appointment Contact information: [address] [place] [address] [place] [address] [phone]                 Signed: Charlton [name] 06/01/2023, 6:15 AM

## 2023-06-01 NOTE — Progress Notes (Signed)
PROGRESS NOTE   Subjective/Complaints:  No acute complaints. Episode of hypoglycemia overnight. PVRs low yesterday, relatively low frequency. Was able to tell nurse yesterday when he was soiled.  Patient endorses frequent use of urinal overnight, and no incontinent episodes. No concerns, complaints today, other than desire to get out of rehab.  ROS: Per HPI above; no new complaints  Objective:   No results found. No results for input(s): "WBC", "HGB", "HCT", "PLT" in the last 72 hours.  No results for input(s): "NA", "K", "CL", "CO2", "GLUCOSE", "BUN", "CREATININE", "CALCIUM" in the last 72 hours.   Intake/Output Summary (Last 24 hours) at 06/01/2023 0923 Last data filed at 05/31/2023 2045 Gross per 24 hour  Intake 720 ml  Output 600 ml  Net 120 ml         Physical Exam: Vital Signs Blood pressure (!) 144/79, pulse 67, temperature 97.7 F (36.5 C), resp. rate 16, height 5\' 6"  (1.676 m), weight 81.2 kg, SpO2 100%.    Constitutional: No distress . Vital signs reviewed.  Laying in bed HEENT: large frontal incision.  EOMI, oral membranes moist Neck: supple Cardiovascular: RRR without murmur. No JVD    Respiratory/Chest: CTA Bilaterally without wheezes or rales. Normal effort    GI/Abdomen: BS +, non-tender, non-distended Ext: no clubbing, cyanosis, or edema Psych: flat, improving engagement and dynamic affect Skin: +excoriations on frontal incisions-resolved.  Postop site stable. Neurologic:   Pt is alert and oriented to person, place, time, and event. Follows basic commands, normal language, speech clear.  Processing speed and attention much improved, is able to do basic math and some spelling today 7-26  motor: Antigravity and against resistance in all 4 extremities equally Sensory exam normal for light touch and pain in all 4 limbs.  No limb ataxia or cerebellar signs.  No abnormal tone appreciated.        Assessment/Plan: 1. Functional deficits which require 3+ hours per day of interdisciplinary therapy in a comprehensive inpatient rehab setting. Physiatrist is providing close team supervision and 24 hour management of active medical problems listed below. Physiatrist and rehab team continue to assess barriers to discharge/monitor patient progress toward functional and medical goals  Care Tool:  Bathing    Body parts bathed by patient: Right arm, Left arm, Chest, Abdomen, Front perineal area, Right upper leg, Buttocks, Left upper leg, Right lower leg, Left lower leg, Face         Bathing assist Assist Level: Minimal Assistance - Patient > 75%     Upper Body Dressing/Undressing Upper body dressing   What is the patient wearing?: Pull over shirt    Upper body assist Assist Level: Supervision/Verbal cueing    Lower Body Dressing/Undressing Lower body dressing      What is the patient wearing?: Pants     Lower body assist Assist for lower body dressing: Minimal Assistance - Patient > 75%     Toileting Toileting    Toileting assist Assist for toileting: Minimal Assistance - Patient > 75%     Transfers Chair/bed transfer  Transfers assist     Chair/bed transfer assist level: Minimal Assistance - Patient > 75%     Locomotion Ambulation  Ambulation assist      Assist level: Minimal Assistance - Patient > 75% Assistive device: No Device Max distance: 150'   Walk 10 feet activity   Assist     Assist level: Minimal Assistance - Patient > 75%     Walk 50 feet activity   Assist    Assist level: Minimal Assistance - Patient > 75% Assistive device: No Device    Walk 150 feet activity   Assist    Assist level: Minimal Assistance - Patient > 75%      Walk 10 feet on uneven surface  activity   Assist     Assist level: Minimal Assistance - Patient > 75%     Wheelchair     Assist Is the patient using a wheelchair?: Yes Type of  Wheelchair: Manual    Wheelchair assist level: Dependent - Patient 0% Max wheelchair distance: 150'    Wheelchair 50 feet with 2 turns activity    Assist        Assist Level: Dependent - Patient 0%   Wheelchair 150 feet activity     Assist      Assist Level: Dependent - Patient 0%   Blood pressure (!) 144/79, pulse 67, temperature 97.7 F (36.5 C), resp. rate 16, height 5\' 6"  (1.676 m), weight 81.2 kg, SpO2 100%.    Medical Problem List and Plan: 1. 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 Team continues to work on motivation and encouragement.   2.  Antithrombotics: -DVT/anticoagulation:  Mechanical: Antiembolism stockings, thigh (TED hose) Bilateral lower extremities             -antiplatelet therapy: ASA 81 mg daily  3. Pain Management: Hydrocodone as needed  4. Mood/Behavior/Sleep:               -behavior improving but still unwilling to participate in therapies at times.  -observe for increased agitation/confusion. -maximize sleep/wake cycle              -antipsychotic agents: seroquel 25mg  every day prn agitation/sleep             -trazodone 50mg  at bedtime---will schedule tonight.              -sleep chart--sleep has been inconsistent - >7 hours 7/23-24; good  5. Neuropsych/cognition: This patient is not capable of making decisions on her own behalf.             -namenda 5mg  bid per home dosing  -7/23: D/w neurology regarding ? Seizures and need for EEG prior to starting stimulant (likely modafinil vs. Ritalin) d/t possible seizure risk   - wife  and patient would benefit from neuropsych consult     - pending on how he does with Keppra wean, add Ritalin in AM 5 mg BID  - 7-25-tolerated Keppra wean, adding Ritalin 5 mg twice daily -7-26 therapy engagement and exam appear much improved in terms of attention and speed of processing; memory remains difficult.  Continue Ritalin at current dose  6. Skin/Wound Care: Routine skin checks    - 7/24: Patient scratching incision sites; educated on need to avoid infection, add benadryl cream PRN  7. Fluids/Electrolytes/Nutrition: pt is dry.Marland KitchenMarland Kitchenpush po fluids -recheck bmet 7/23   8 Seizure prophylaxis. keppra 500 mg BID for 14 days post-op - Wife has reported staring spells post-op; ?absence seizures, however some last a long time (>30 min) and none witnessed inpatient -no similar activity reported while inpatient  - 7/23: D/w neurology, no consult needed, ordered EEG and f/u OP with [facility] for 72 hour study after discharge if no findings - 7/24: EEG no epileptiform discharges, generalized findings in frontal area. D/w wife. DC keppra and monitor.    9.Hypothyroid. Synthroid  10.DM.Semglee 60 unit daily and Glucophage 1000 mg bid  -SSI/Monitor while on decadron  - 7/20: BG low this evening  50s; reduce semglee to 50 U   - 7/21: BG increased this evening; increase semglee back to 55 U; if ongoing low AM and high PM might consider splitting dose  -7/22 CBG's better today with recent changes--follow for pattern   - 7/23: AM hypoglycemia again - moving target while weaning steroids - will go back to 50U semglee and allow highs to avoid lows   - 7/24-25: [facility] better controlled with steroid wean; monitor  -7-26: Semglee back to 40 units daily given low this a.m; will discontinue with stop with steroids on 7-27 and if blood sugars remain elevated resume glipizide 10 mg per home regimen Recent Labs    05/31/23 2044 06/01/23 0617 06/01/23 0635  GLUCAP 129* 63* 75      11.GERD.Protonix 12.Hyperlipidemia.Lipitor 13.HTN.Lopressor 25 mg BID ` -bp controlled at present      06/01/2023    6:18 AM 05/31/2023    8:16 PM 05/31/2023    1:53 PM  Vitals with BMI  Systolic 144 125 403  Diastolic 79 69 70  Pulse 67 73 77   14. Steroids for cerebral edema. Resumed decadron taper on admission to hospital 7/15.  - 7/15-17: 2 mg total) by mouth Q6H - 7/18-21: 2 tablets (2 mg total) - 2 (two) times daily with a meal for 3 days - 7/22-24: tablet (1 mg total) 2 (two) times daily with a meal for 3 days - 7/25-27: 1 tablet (1 mg total) daily with breakfast for 3 days.   15: Urinary incontinence/urgency-likely behavioral component as patient does not notify nursing or staff when he voids -PVRs today, if no improved continence with starting Ritalin for attention/memory, and if PVRs remain low, may start oxybutynin 5 mg twice daily in a.m. -7-26: PVRs low, patient continent overnight, attributes to improved processing and cognition with Ritalin.  No medication adjustments at this time.  LOS: 7 days A FACE TO FACE EVALUATION WAS PERFORMED  Angelina Sheriff 06/01/2023, 9:23 AM

## 2023-06-01 NOTE — Progress Notes (Signed)
Physical Therapy Session Note  Patient Details  Name: [name] [name] MRN: [id number] Date of Birth: 09/22/1946  Today's Date: 06/01/2023 PT Individual Time: 0800-0900 PT Individual Time Calculation (min): 60 min   Short Term Goals: Week 1:  PT Short Term Goal 1 (Week 1): STGs = LTGs  Skilled Therapeutic Interventions/Progress Updates:  Patient greeted supine in bed with RN present and agreeable to PT treatment session. Patient required increased time and multimodal cues in order to take his morning medications. Patient noted to have an incontinent episode of urine and given the option of performing a sponge bath at the EOB or showering- With increased time to make a decision patient stated he would sponge off. Patient transitioned to EOB with supv where he remained sitting to doff shirt and clean his body. Patient stood various amount of times without the use of an AD and supv in order to doff soiled brief and clothes and don clean brief and pants. While seated patient was able to don socks and shoes with set-up assistance. Patient required increased time with multimodal cues to complete the above secondary to impaired initiation and decreased desire. Patient then stood from EOB and gait trained around the RN station (>200') with SBA for safety. Patient left sitting in bedside recliner with posey belt on, call bell within reach and all needs met.    Therapy Documentation Precautions:  Precautions Precautions: Fall Restrictions Weight Bearing Restrictions: No  Pain: No/Denies pain.    Therapy/Group: Individual Therapy  Dariana Garbett 06/01/2023, 7:46 AM

## 2023-06-01 NOTE — Plan of Care (Signed)
  Problem: Consults Goal: RH BRAIN INJURY PATIENT EDUCATION Description: Description: See Patient Education module for eduction specifics Outcome: Progressing   Problem: RH BOWEL ELIMINATION Goal: RH STG MANAGE BOWEL WITH ASSISTANCE Description: STG Manage Bowel with mod I/toileting Assistance. Outcome: Progressing Goal: RH STG MANAGE BOWEL W/MEDICATION W/ASSISTANCE Description: STG Manage Bowel with Medication with mod I  Assistance. Outcome: Progressing   Problem: RH BLADDER ELIMINATION Goal: RH STG MANAGE BLADDER WITH ASSISTANCE Description: STG Manage Bladder With toileting Assistance Outcome: Progressing Goal: RH STG MANAGE BLADDER WITH MEDICATION WITH ASSISTANCE Description: STG Manage Bladder With Medication With mod I Assistance. Outcome: Progressing   Problem: RH SAFETY Goal: RH STG ADHERE TO SAFETY PRECAUTIONS W/ASSISTANCE/DEVICE Description: STG Adhere to Safety Precautions With cues Assistance/Device. Outcome: Progressing   Problem: RH COGNITION-NURSING Goal: RH STG USES MEMORY AIDS/STRATEGIES W/ASSIST TO PROBLEM SOLVE Description: STG Uses Memory Aids/Strategies With cues Assistance to Problem Solve. Outcome: Progressing   Problem: RH PAIN MANAGEMENT Goal: RH STG PAIN MANAGED AT OR BELOW PT'S PAIN GOAL Description: < 4 with prns Outcome: Progressing   Problem: RH KNOWLEDGE DEFICIT BRAIN INJURY Goal: RH STG INCREASE KNOWLEDGE OF SELF CARE AFTER BRAIN INJURY Description: Patient and family will be able to manage care at discharge , medications and dietary modification using educational resources independently Outcome: Progressing   Problem: Education: Goal: Knowledge of the prescribed therapeutic regimen will improve Outcome: Progressing   Problem: Clinical Measurements: Goal: Usual level of consciousness will be regained or maintained. Outcome: Progressing Goal: Neurologic status will improve Outcome: Progressing Goal: Ability to maintain intracranial  pressure will improve Outcome: Progressing   Problem: Skin Integrity: Goal: Demonstration of wound healing without infection will improve Outcome: Progressing

## 2023-06-02 DIAGNOSIS — Z794 Long term (current) use of insulin: Secondary | ICD-10-CM

## 2023-06-02 DIAGNOSIS — R519 Headache, unspecified: Secondary | ICD-10-CM

## 2023-06-02 DIAGNOSIS — E11649 Type 2 diabetes mellitus with hypoglycemia without coma: Secondary | ICD-10-CM

## 2023-06-02 DIAGNOSIS — R32 Unspecified urinary incontinence: Secondary | ICD-10-CM

## 2023-06-02 LAB — GLUCOSE, CAPILLARY
Glucose-Capillary: 89 mg/dL (ref 70–99)
Glucose-Capillary: 126 mg/dL — ABNORMAL HIGH (ref 70–99)
Glucose-Capillary: 206 mg/dL — ABNORMAL HIGH (ref 70–99)
Glucose-Capillary: 173 mg/dL — ABNORMAL HIGH (ref 70–99)

## 2023-06-02 MED ORDER — INSULIN GLARGINE-YFGN 100 UNIT/ML ~~LOC~~ SOLN
37.0000 [IU] | Freq: Every day | SUBCUTANEOUS | Status: DC
  Administered 2023-06-03: 37 [IU] via SUBCUTANEOUS
  Filled 2023-06-02 (×2): qty 0.37

## 2023-06-02 NOTE — Progress Notes (Signed)
Physical Therapy Session Note  Patient Details  Name: [name] [name] MRN: [id number] Date of Birth: 1946/10/24  Today's Date: 06/02/2023 PT Individual Time: 0916-1000 PT Individual Time Calculation (min): 44 min   Short Term Goals: Week 1:  PT Short Term Goal 1 (Week 1): STGs = LTGs  Skilled Therapeutic Interventions/Progress Updates:  Pt was seen bedside in the am with nurse finishing up giving patient medicine. Pt requiring frequent cues throughout treatment to remain focused on task at hand and engaged in therapy. Pt transferred supine to edge of bed with S. Pt maintained sitting balance in edge of bed with S. Pt's clothes wet secondary to bladder accident. As per pt he was unaware. Pt able to remove shirt and don new one with S. Pt able to stand several times at edge of bed with S. Pt assist with removing depends and pants. Pt was then able to put on new depends and pants. Pt was able to stand with S to pull up depends and pants. Pt returned to supine with S. Bladder scan performed by nurse tech. Pt returned to edge of bed with S. Pt transferred to standing with S. Pt ambulated 150 feet without assistive device and S, fluctuating cadence and step length no loss of balance. In gym, pt performed step taps 1 set x 5 reps with c/g to min A. Pt ambulated back to room with S and verbal cues, no loss of balance. Pt transferred edge of bed to supine with S. Pt left sitting up in bed with bed alarm and all needs within reach.   Therapy Documentation Precautions:  Precautions Precautions: Fall Restrictions Weight Bearing Restrictions: No General:   Pain: No c/o pain.   Therapy/Group: Individual Therapy  Rayford Halsted 06/02/2023, 12:26 PM

## 2023-06-02 NOTE — Progress Notes (Addendum)
PROGRESS NOTE   Subjective/Complaints:  No acute events noted overnight.  Patient reports some occasional headache/pain along his forehead, has not tried any medicines yet but will ask nursing to get him some Tylenol.  His wife asked about results of prior EEG, she reports she talked to a PCP doctor Who said there were other findings besides being negative for seizures.  ROS: Denies chest pain, shortness of breath, abdominal pain  Objective:   No results found. No results for input(s): "WBC", "HGB", "HCT", "PLT" in the last 72 hours.  No results for input(s): "NA", "K", "CL", "CO2", "GLUCOSE", "BUN", "CREATININE", "CALCIUM" in the last 72 hours.   Intake/Output Summary (Last 24 hours) at 06/02/2023 1825 Last data filed at 06/02/2023 1814 Gross per 24 hour  Intake 821 ml  Output 400 ml  Net 421 ml         Physical Exam: Vital Signs Blood pressure 127/80, pulse 68, temperature 98 F (36.7 C), resp. rate 17, height 5\' 6"  (1.676 m), weight 81.2 kg, SpO2 100%.    Constitutional: No distress . Vital signs reviewed.  Laying in bed HEENT: large frontal incision-no significant tenderness warmth or signs of infection noted EOMI, oral membranes moist Neck: supple Cardiovascular: RRR without murmur. No JVD    Respiratory/Chest: CTA Bilaterally without wheezes or rales. Normal effort    GI/Abdomen: BS +, non-tender, non-distended Ext: no clubbing, cyanosis, or edema Psych: flat, improving engagement and dynamic affect Skin: +excoriations on frontal incisions-resolved.  Postop site stable. Neurologic:   Pt is alert and oriented to person, place, time, and event. Follows basic commands, normal language, speech clear.  Processing speed and attention much improved, is able to do basic math and some spelling today 7-26  motor: Antigravity and against resistance in all 4 extremities equally Sensory exam normal for light touch and  pain in all 4 limbs.  No limb ataxia or cerebellar signs.  No abnormal tone appreciated.       Assessment/Plan: 1. Functional deficits which require 3+ hours per day of interdisciplinary therapy in a comprehensive inpatient rehab setting. Physiatrist is providing close team supervision and 24 hour management of active medical problems listed below. Physiatrist and rehab team continue to assess barriers to discharge/monitor patient progress toward functional and medical goals  Care Tool:  Bathing    Body parts bathed by patient: Right arm, Left arm, Chest, Abdomen, Front perineal area, Right upper leg, Buttocks, Left upper leg, Right lower leg, Left lower leg, Face         Bathing assist Assist Level: Contact Guard/Touching assist     Upper Body Dressing/Undressing Upper body dressing   What is the patient wearing?: Pull over shirt    Upper body assist Assist Level: Set up assist    Lower Body Dressing/Undressing Lower body dressing      What is the patient wearing?: Pants     Lower body assist Assist for lower body dressing: Contact Guard/Touching assist     Toileting Toileting    Toileting assist Assist for toileting: Contact Guard/Touching assist     Transfers Chair/bed transfer  Transfers assist     Chair/bed transfer assist level: Supervision/Verbal cueing  Locomotion Ambulation   Ambulation assist      Assist level: Supervision/Verbal cueing Assistive device: No Device Max distance: 150   Walk 10 feet activity   Assist     Assist level: Supervision/Verbal cueing Assistive device: No Device   Walk 50 feet activity   Assist    Assist level: Supervision/Verbal cueing Assistive device: No Device    Walk 150 feet activity   Assist    Assist level: Supervision/Verbal cueing Assistive device: No Device    Walk 10 feet on uneven surface  activity   Assist     Assist level: Minimal Assistance - Patient > 75%      Wheelchair     Assist Is the patient using a wheelchair?: Yes Type of Wheelchair: Manual    Wheelchair assist level: Dependent - Patient 0% Max wheelchair distance: 150'    Wheelchair 50 feet with 2 turns activity    Assist        Assist Level: Dependent - Patient 0%   Wheelchair 150 feet activity     Assist      Assist Level: Dependent - Patient 0%   Blood pressure 127/80, pulse 68, temperature 98 F (36.7 C), resp. rate 17, height 5\' 6"  (1.676 m), weight 81.2 kg, SpO2 100%.    Medical Problem List and Plan: 1. 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 Team continues to work on motivation and encouragement.   -7/27 wife asked about results of EEG which was reviewed.  No seizure activity was noted.  EEG with cortical dysfunction bifrontal region and mild diffuse encephalopathy appears consistent with prior history and I do not think this requires acute intervention.  2.  Antithrombotics: -DVT/anticoagulation:  Mechanical: Antiembolism stockings, thigh (TED hose) Bilateral lower extremities             -antiplatelet therapy: ASA 81 mg daily  3. Pain Management: Hydrocodone as needed  -7/27 patient with mild occasional headache, advised trying as needed Tylenol.  Patient to call nursing  4. Mood/Behavior/Sleep:               -behavior improving but still unwilling to participate in therapies at times.  -observe for increased agitation/confusion. -maximize sleep/wake cycle              -antipsychotic agents: seroquel 25mg  every day prn agitation/sleep              -trazodone 50mg  at bedtime---will schedule tonight.              -sleep chart--sleep has been inconsistent - >7 hours 7/23-24; good  5. Neuropsych/cognition: This patient is not capable of making decisions on her own behalf.             -namenda 5mg  bid per home dosing  -7/23: D/w neurology regarding ? Seizures and need for EEG prior to starting stimulant (likely modafinil vs. Ritalin) d/t possible seizure risk   - wife and patient would benefit from neuropsych consult     - pending on  how he does with Keppra wean, add Ritalin in AM 5 mg BID  - 7-25-tolerated Keppra wean, adding Ritalin 5 mg twice daily -7-26 therapy engagement and exam appear much improved in terms of attention and speed of processing; memory remains difficult.  Continue Ritalin at current dose  6. Skin/Wound Care: Routine skin checks    - 7/24: Patient scratching incision sites; educated on need to avoid infection, add benadryl cream PRN  7. Fluids/Electrolytes/Nutrition: pt is dry.Marland KitchenMarland Kitchenpush po fluids -recheck bmet 7/23   8 Seizure prophylaxis. keppra 500 mg BID for 14 days post-op - Wife has reported staring spells post-op; ?absence seizures, however some last a long time (>30 min) and none witnessed inpatient -no similar activity reported while inpatient  - 7/23: D/w neurology, no consult needed, ordered EEG and f/u OP with [facility] for 72 hour study after discharge if no findings - 7/24: EEG no epileptiform discharges, generalized findings in frontal area. D/w wife. DC keppra and monitor.    9.Hypothyroid. Synthroid  10.DM.Semglee 60 unit daily and Glucophage 1000 mg bid  -SSI/Monitor while on decadron  - 7/20: BG low this evening  50s; reduce semglee to 50 U   - 7/21: BG increased this evening; increase semglee back to 55 U; if ongoing low AM and high PM might consider splitting dose  -7/22 CBG's better today with recent changes--follow for pattern   - 7/23: AM hypoglycemia again - moving target  while weaning steroids - will go back to 50U semglee and allow highs to avoid lows   - 7/24-25: [facility] better controlled with steroid wean; monitor  -7-26: Semglee back to 40 units daily given low this a.m; will discontinue with stop with steroids on 7-27 and if blood sugars remain elevated resume glipizide 10 mg per home regimen  7/27 will decrease Semglee to 37 units as would like to avoid hypoglycemia Recent Labs    06/02/23 0556 06/02/23 1128 06/02/23 1616  GLUCAP 89 126* 206*     11.GERD.Protonix 12.Hyperlipidemia.Lipitor 13.HTN.Lopressor 25 mg BID ` -7/27 BP controlled, continue current regimen      06/02/2023   12:56 PM 06/02/2023    4:38 AM 06/01/2023    7:19 PM  Vitals with BMI  Systolic 127 101 952  Diastolic 80 49 68  Pulse 68 67 85   14. Steroids for cerebral edema. Resumed decadron taper on admission to hospital 7/15.  - 7/15-17: 2 mg total) by mouth Q6H - 7/18-21: 2 tablets (2 mg total) - 2 (two) times daily with a meal for 3 days - 7/22-24: tablet (1 mg total) 2 (two) times daily with a meal for 3 days - 7/25-27: 1 tablet (1 mg total) daily with breakfast for 3 days.   15: Urinary incontinence/urgency-likely behavioral component as patient does not notify nursing or staff when he voids -PVRs today, if no improved continence with starting Ritalin for attention/memory, and if PVRs remain low, may start oxybutynin 5 mg twice daily in a.m. -7-26: PVRs low, patient continent overnight, attributes to improved processing and cognition with Ritalin.  No medication adjustments at this time. 7/27 intermittent incontinence however appears to be improving, PVRs 0, continue current regimen  LOS: 8 days A FACE TO FACE EVALUATION WAS PERFORMED  Fanny Dance 06/02/2023, 6:25 PM

## 2023-06-02 NOTE — Progress Notes (Signed)
Physical Therapy Weekly Progress Note  Patient Details  Name: [name] [name] MRN: [id number] Date of Birth: 02-Sep-1946  Beginning of progress report period: May 26, 2023 End of progress report period: June 02, 2023  No new STGs were established for this patient secondary to ELOS. Patient is making slow progress toward his LTGs secondary to impaired initiation/motor planning with a behavior component. Patient requires a significant amount of time to complete functional mobility tasks, especially initiating sit/stands or transitioning from supine to sitting EOB. This is most notable when the patient does not want to perform the said task. Current plan is for patient to discharge SNF at this time due to wife being unable to provide the amount of cognitive, and at times physical assist he will need. Patient is incontinent and unaware of these episodes. When moving with his own volition, patient only requires CGA/Supv for all functional mobility.   Patient continues to demonstrate the following deficits muscle weakness, decreased cardiorespiratoy endurance, decreased motor planning, decreased initiation, decreased attention, decreased awareness, decreased problem solving, decreased safety awareness, decreased memory, and delayed processing, and decreased sitting balance, decreased standing balance, and decreased balance strategies and therefore will continue to benefit from skilled PT intervention to increase functional independence with mobility.  Patient progressing toward long term goals..  Continue plan of care.  PT Short Term Goals Week 1:  PT Short Term Goal 1 (Week 1): STGs = LTGs Week 2:  PT Short Term Goal 1 (Week 2): STGs=LTGs secondary to ELOS   [place]  Shelise Maron 06/02/2023, 10:29 AM

## 2023-06-03 LAB — GLUCOSE, CAPILLARY
Glucose-Capillary: 123 mg/dL — ABNORMAL HIGH (ref 70–99)
Glucose-Capillary: 104 mg/dL — ABNORMAL HIGH (ref 70–99)
Glucose-Capillary: 200 mg/dL — ABNORMAL HIGH (ref 70–99)
Glucose-Capillary: 66 mg/dL — ABNORMAL LOW (ref 70–99)
Glucose-Capillary: 73 mg/dL (ref 70–99)

## 2023-06-03 NOTE — Progress Notes (Signed)
PROGRESS NOTE   Subjective/Complaints: No acute events overnight.  Patient reports there is a question I wanted to ask you but I forgot what it was.  Denies pain Last BM today ROS: Denies chest pain, shortness of breath, abdominal pain Denies any scalp pain or headache today  Objective:   No results found. No results for input(s): "WBC", "HGB", "HCT", "PLT" in the last 72 hours.  No results for input(s): "NA", "K", "CL", "CO2", "GLUCOSE", "BUN", "CREATININE", "CALCIUM" in the last 72 hours.   Intake/Output Summary (Last 24 hours) at 06/03/2023 1742 Last data filed at 06/03/2023 1515 Gross per 24 hour  Intake 737 ml  Output 1600 ml  Net -863 ml         Physical Exam: Vital Signs Blood pressure 135/73, pulse 71, temperature 98.1 F (36.7 C), temperature source Oral, resp. rate 18, height 5\' 6"  (1.676 m), weight 81.2 kg, SpO2 97%.    Constitutional: No distress . Vital signs reviewed.   HEENT: large frontal incision-no significant tenderness warmth or signs of infection noted , oral membranes moist Neck: supple Cardiovascular: RRR Respiratory/Chest: CTA Bilaterally without wheezes or rales. Normal effort    GI/Abdomen: Soft, BS +, non-tender, non-distended Ext: no clubbing, cyanosis, or edema Psych: flat, improving engagement and dynamic affect Skin: +excoriations on frontal incisions-resolved.  Postop site stable. Neurologic:   Pt is alert and oriented to person, place, time, and event. Follows basic commands, normal language, speech clear.  motor: Antigravity and against resistance in all 4 extremities equally Sensory exam normal for light touch and pain in all 4 limbs  No abnormal tone noted     Assessment/Plan: 1. Functional deficits which require 3+ hours per day of interdisciplinary therapy in a comprehensive inpatient rehab setting. Physiatrist is providing close team supervision and 24 hour management  of active medical problems listed below. Physiatrist and rehab team continue to assess barriers to discharge/monitor patient progress toward functional and medical goals  Care Tool:  Bathing    Body parts bathed by patient: Right arm, Left arm, Chest, Abdomen, Front perineal area, Right upper leg, Buttocks, Left upper leg, Right lower leg, Left lower leg, Face         Bathing assist Assist Level: Contact Guard/Touching assist     Upper Body Dressing/Undressing Upper body dressing   What is the patient wearing?: Pull over shirt    Upper body assist Assist Level: Set up assist    Lower Body Dressing/Undressing Lower body dressing      What is the patient wearing?: Pants     Lower body assist Assist for lower body dressing: Contact Guard/Touching assist     Toileting Toileting    Toileting assist Assist for toileting: Contact Guard/Touching assist     Transfers Chair/bed transfer  Transfers assist     Chair/bed transfer assist level: Supervision/Verbal cueing     Locomotion Ambulation   Ambulation assist      Assist level: Supervision/Verbal cueing Assistive device: No Device Max distance: 150   Walk 10 feet activity   Assist     Assist level: Supervision/Verbal cueing Assistive device: No Device   Walk 50 feet activity   Assist  Assist level: Supervision/Verbal cueing Assistive device: No Device    Walk 150 feet activity   Assist    Assist level: Supervision/Verbal cueing Assistive device: No Device    Walk 10 feet on uneven surface  activity   Assist     Assist level: Minimal Assistance - Patient > 75%     Wheelchair     Assist Is the patient using a wheelchair?: Yes Type of Wheelchair: Manual    Wheelchair assist level: Dependent - Patient 0% Max wheelchair distance: 150'    Wheelchair 50 feet with 2 turns activity    Assist        Assist Level: Dependent - Patient 0%   Wheelchair 150 feet  activity     Assist      Assist Level: Dependent - Patient 0%   Blood pressure 135/73, pulse 71, temperature 98.1 F (36.7 C), temperature source Oral, resp. rate 18, height 5\' 6"  (1.676 m), weight 81.2 kg, SpO2 97%.    Medical Problem List and Plan: 1. 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 Team continues to work on motivation and encouragement.   -7/27 wife asked about results of EEG which was reviewed.  No seizure activity was noted.  EEG with cortical dysfunction bifrontal region and mild diffuse encephalopathy appears consistent with prior history and I do not think this requires acute intervention.  2.  Antithrombotics: -DVT/anticoagulation:  Mechanical: Antiembolism stockings, thigh (TED hose) Bilateral lower extremities             -antiplatelet therapy: ASA 81 mg daily  3. Pain Management: Hydrocodone as needed  -7/27 patient with mild occasional headache, advised trying as needed Tylenol.  Patient to call nursing  -7/28 scalp pain/headache has resolved, continue to monitor  4. Mood/Behavior/Sleep:               -behavior improving but still unwilling to participate in therapies at times.  -observe for increased agitation/confusion. -maximize sleep/wake cycle              -antipsychotic agents: seroquel 25mg  every day prn agitation/sleep             -trazodone 50mg  at bedtime---will schedule tonight.              -sleep chart--sleep has been inconsistent - >7 hours 7/23-24; good  5. Neuropsych/cognition: This patient is not capable of making decisions on her own  behalf.             -namenda 5mg  bid per home dosing  -7/23: D/w neurology regarding ? Seizures and need for EEG prior to starting stimulant (likely modafinil vs. Ritalin) d/t possible seizure risk   - wife and patient would benefit from neuropsych consult     - pending on how he does with Keppra wean, add Ritalin in AM 5 mg BID  - 7-25-tolerated Keppra wean, adding Ritalin 5 mg twice daily -7-26 therapy engagement and exam appear much improved in terms of attention and speed of processing; memory remains difficult.  Continue Ritalin at current dose  6. Skin/Wound Care: Routine skin checks    - 7/24: Patient scratching incision sites; educated on need to avoid infection, add benadryl cream PRN  7. Fluids/Electrolytes/Nutrition: pt is dry.Marland KitchenMarland Kitchenpush po fluids -recheck bmet 7/23   8 Seizure prophylaxis. keppra 500 mg BID for 14 days post-op - Wife has reported staring spells post-op; ?absence seizures, however some last a long time (>30 min) and none witnessed inpatient -no similar activity reported while inpatient  - 7/23: D/w neurology, no consult needed, ordered EEG and f/u OP with [facility] for 72 hour study after discharge if no findings - 7/24: EEG no epileptiform discharges, generalized findings in frontal area. D/w wife. DC keppra and monitor.    9.Hypothyroid. Synthroid  10.DM.Semglee 60 unit daily and Glucophage 1000 mg bid  -SSI/Monitor while on decadron  - 7/20: BG low this evening  50s; reduce semglee to 50 U   - 7/21: BG increased this evening; increase semglee back to 55 U; if ongoing low AM and high PM might consider splitting dose  -7/22 CBG's better today with recent changes--follow for pattern   - 7/23: AM hypoglycemia again - moving target while weaning steroids - will go back to 50U semglee and allow highs to avoid lows   - 7/24-25: [facility] better controlled with steroid wean; monitor  -7-26: Semglee back to 40 units daily given low this a.m; will discontinue with stop  with steroids on 7-27 and if blood sugars remain elevated resume glipizide 10 mg per home regimen  7/27 will decrease Semglee to 37 units as would like to avoid  Hypoglycemia 7/28 CBG stable continue current regimen Recent Labs    06/03/23 0558 06/03/23 1114 06/03/23 1617  GLUCAP 123* 104* 200*     11.GERD.Protonix 12.Hyperlipidemia.Lipitor 13.HTN.Lopressor 25 mg BID ` -7/28 pressure well-controlled, continue current regimen      06/03/2023    3:04 PM 06/03/2023    5:25 AM 06/02/2023    7:09 PM  Vitals with BMI  Systolic 135 123 086  Diastolic 73 71 75  Pulse 71 67 79   14. Steroids for cerebral edema. Resumed decadron taper on admission to hospital 7/15.  - 7/15-17: 2 mg total) by mouth Q6H - 7/18-21: 2 tablets (2 mg total) - 2 (two) times daily with a meal for 3 days - 7/22-24: tablet (1 mg total) 2 (two) times daily with a meal for 3 days - 7/25-27: 1 tablet (1 mg total) daily with breakfast for 3 days.   15: Urinary incontinence/urgency-likely behavioral component as patient does not notify nursing or staff when he voids -PVRs today, if no improved continence with starting Ritalin for attention/memory, and if PVRs remain low, may start oxybutynin 5 mg twice daily in a.m. -7-26: PVRs low, patient continent overnight, attributes to improved processing and cognition with Ritalin.  No medication adjustments at this time. 7/27 intermittent incontinence however appears to be improving, PVRs 0, continue current regimen 7/28 continent last 24 hours, improved  LOS: 9 days A FACE TO FACE EVALUATION WAS PERFORMED  Fanny Dance 06/03/2023, 5:42 PM

## 2023-06-03 NOTE — Significant Event (Signed)
Hypoglycemic Event  CBG: 66  Treatment: 4 oz juice/soda  Symptoms: None  Follow-up CBG: Time:2132 CBG Result:73  Possible Reasons for Event: Unknown  Comments/MD notified:treated per protocol    Alfredo Martinez A

## 2023-06-04 DIAGNOSIS — F0670 Mild neurocognitive disorder due to known physiological condition without behavioral disturbance: Secondary | ICD-10-CM

## 2023-06-04 LAB — GLUCOSE, CAPILLARY
Glucose-Capillary: 110 mg/dL — ABNORMAL HIGH (ref 70–99)
Glucose-Capillary: 182 mg/dL — ABNORMAL HIGH (ref 70–99)
Glucose-Capillary: 154 mg/dL — ABNORMAL HIGH (ref 70–99)
Glucose-Capillary: 143 mg/dL — ABNORMAL HIGH (ref 70–99)

## 2023-06-04 MED ORDER — INSULIN GLARGINE-YFGN 100 UNIT/ML ~~LOC~~ SOLN
30.0000 [IU] | Freq: Every day | SUBCUTANEOUS | Status: DC
  Administered 2023-06-04 – 2023-06-05 (×2): 30 [IU] via SUBCUTANEOUS
  Filled 2023-06-04 (×2): qty 0.3

## 2023-06-04 NOTE — Progress Notes (Signed)
Speech Language Pathology Daily Session Note  Patient Details  Name: [name] [name] MRN: [id number] Date of Birth: 09-Mar-1946  Today's Date: 06/04/2023 SLP Individual Time: 1300-1345 SLP Individual Time Calculation (min): 45 min  Short Term Goals: Week 1: SLP Short Term Goal 1 (Week 1): Patient will demonstrate sustained attention to therapeutic task for >11minutes with min verbal cues. SLP Short Term Goal 2 (Week 1): Patient will recall 3 external memory strategies for use at home with min verbal cues. SLP Short Term Goal 3 (Week 1): Patient will demonstrate increased awareness of impairments by self-correcting on therapeutic task with minA multimodal cues.  Skilled Therapeutic Interventions:   Pt and wife greeted at bedside as he was completing his noon meal. INDly utilized calendar during orientation review and he was able to recall that he's not going home tomorrow. He was able to provide vague details re daily events, but benefited from [facility] verbal cues to recall details of the day thus far. SLP introduced memory notebook to demonstrate ideal use of compensatory memory strategy in his current and home environment. Pt benefited from modA verbal cues from SLP and wife as well as use of his printed therapy schedule to recall daily information for the second time and provide brief synopsis for each timed section of the day (8-10 am, 10am-12pm, etc). He demonstrated adequate word finding in simple conversation and required only spv cues for mildly specific word finding and sentence formulation as conversation increased in complexity. Pt left in bed with the bed alarm set and call light within reach. Recommend cont ST per POC.   Pain  No pain  Therapy/Group: Individual Therapy  [name] 06/04/2023, 4:20 PM

## 2023-06-04 NOTE — Progress Notes (Signed)
Physical Therapy Session Note  Patient Details  Name: [name] [name] MRN: [id number] Date of Birth: 21-Oct-1946  Today's Date: 06/04/2023 PT Individual Time: 1120-1205 PT Individual Time Calculation (min): 45 min   Short Term Goals: Week 2:  PT Short Term Goal 1 (Week 2): STGs=LTGs secondary to ELOS  Skilled Therapeutic Interventions/Progress Updates:  Patient greeted standing in his room and agreeable to PT treatment session. Patient ambulated throughout the fourth floor to the [facility] without the use of an AD and Supv for safety- At times patient demonstrated minor LOBs when turning or distracted by other people in the hallway requiring MinA for stability secondary to poor righting reactions. Patient gait trained >1,000 feet to the [facility] with a seated rest break due to fatigue and mild SOB. VC for improved awareness and encouragement throughout to continue walking. Patient returned to his room and requested to use the restroom- Patient was able to void with distant supv. Patient left sitting EOB with call bell within reach, bed alarm on and all needs met.    Therapy Documentation Precautions:  Precautions Precautions: Fall Restrictions Weight Bearing Restrictions: No  Pain: No/Denies pain.    Therapy/Group: Individual Therapy  Virginia Curl 06/04/2023, 12:19 PM

## 2023-06-04 NOTE — Progress Notes (Signed)
Occupational Therapy Session Note  Patient Details  Name: [name] [name] MRN: [id number] Date of Birth: 11/04/1946  Today's Date: 06/04/2023 OT Individual Time: 0736-0820 OT Individual Time Calculation (min): 44 min    Short Term Goals: Week 2:  OT Short Term Goal 1 (Week 2): LTG=STG 2/2 ELOS  Skilled Therapeutic Interventions/Progress Updates:    Pt greeted semi-reclined in bed awake after finishing breakfast and agreeable to OT treatment session. Pt initiated bed mobility quickly today with only min cues stating " now I have a reason to get up, I'm going home tomorrow." Pt ambulated to the sink without device and supervision. He then reported need to go to the bathroom and ambulated into bathroom with no AD and supervision. Pt voided bladder and completed clothing management with supervision. Bathing tasks completed sit<>stand from wc at the sink with increased time, min cues for safety, but overall supervision. Pt ambulated to the recliner and was left seated in recliner with alarm belt on, call bell in reach, ad needs met.   Therapy Documentation Precautions:  Precautions Precautions: Fall Restrictions Weight Bearing Restrictions: No Pain: Pain Assessment Pain Scale: 0-10 Pain Score: 0-No pain    Therapy/Group: Individual Therapy  Mal Amabile 06/04/2023, 11:47 AM

## 2023-06-04 NOTE — Consult Note (Signed)
Neuropsychological Consultation Comprehensive Inpatient Rehab   Patient:   [name] [name]   DOB:   01-14-1946  MR Number:  [id number]  Location:  [facility] [address] [place] [address] Dept: [phone] Loc: [phone]           Date of Service:   06/04/2023  Start Time:   10 AM End Time:   11 AM  Provider/Observer:  Arley Phenix, Psy.D.       Clinical Neuropsychologist       Billing Code/Service: [phone]  Reason for Service:    [name] [name] is a 77 year old male referred for neuropsychological consultation due to coping and adjustment issues as well as cognitive changes after recent bifrontal craniotomy resection at the beginning of July with current admission onto the comprehensive inpatient rehabilitation unit.  Patient has a past medical history including CAD/CABG, hypertension, diabetes, hyperlipidemia as well as colon cancer 2 years ago.  The patient was independent prior to admission.  Patient had presented on 05/08/2023 with altered mental status and mood changes.  CT imaging showed bifrontal extra axial tumor with mass effect.  Patient underwent planned craniotomy with resection of tumor.  Decadron and Keppra were both added.  Patient had continued with cognitive difficulties consistent with frontal lobe involvement.  Patient has had limited mobility and cognitive changes post neurosurgical interventions.  During today's visit, the patient was oriented and aware of his recent surgery and past medical history.  The patient was engaged but clearly very impulsive.  Patient reported and was clearly noted during our visit significant issues with attention and concentration and some impulse control.  Patient described extended hospital stay and that he was very anxious to be discharged from the hospital.  However, the patient was not clear on what was actually going to happen.  Apparently, the patient's wife  is not able to take care of the patient for discharge home and they are still working on discharge planning with possible skilled nursing facility placement at discharge.  Patient was aware that this was a possibility but did appear to be somewhat confused about different parameters.  Patient was aware of difficulties with recall of recently learned information in a timely manner.  However, the memory deficits did not appear to be due to an inability to learn and store/organize new information but more of a retrieval issue.  The patient clearly displayed the ability to learn new information but active retrieval and a noncued/recognition setting was problematic for him.  The patient also had some signs of word finding difficulties but no reduced word knowledge and was able to self-correct with time.  Patient denied any significant depression beyond what he felt was normal response due to extended hospital stay.  Patient was aware of some of his wife's limitations and when we addressed the possibility of discharge to skilled nursing facility did not appear to be overly distressed by this possibility as he is continuing to expect to make further progress post surgery.  Cognitive issues appear to be frontal lobe in nature consistent with tumor and resection of tumor in his bilateral frontal region.  Impulse control, retrieval of information, information processing challenges etc. are all noted.  HPI for the current admission:    HPI: [name] [name] is a 77 Y/O male with history of with H/O CAD/CABG HTN DM HLD as well as colon cancer and bifrontal craniotomy resection for meningioma 2 years ago.Lives with spouse  Independent PTA Wife does assist withADLS. Presented 05/08/2023 with AMS and mood changes.Ct imaging showed large bi frontal extra axial tumor with mass effect.Underwent planned stereotactic cranio recection for tumor like;y meningioma7/2/24 per Dr Jake Samples. Decadron taper as indicated.Decadron taper as indicated.  Keppra for seizure prophylaxis..Low dose aspirin resumed for H/O CAD.Tolerating a regular dirt Therapy eval completed and due to limited mobility was admitted for a comprehensive rehab program.   Medical History:   Past Medical History:  Diagnosis Date   Bladder infection    10th grade   CAD (coronary artery disease)    s/p 3V CABG 2001   Cervical spondylosis 2002   History of   Colon cancer (HCC)    Diabetes mellitus (HCC)    Dysphagia    GERD (gastroesophageal reflux disease)    Heart murmur    pt has had echo   HTN (hypertension)    Hyperlipidemia    Hypothyroidism    LVH (left ventricular hypertrophy) 2015   Mild   Myocardial infarction (HCC)    x2   PVC (premature ventricular contraction)    a. s/p ablation   Tobacco abuse, in remission          Patient Active Problem List   Diagnosis Date Noted   Mild neurocognitive disorder due to another medical condition 06/04/2023   Meningioma (HCC) 05/21/2023   Brain tumor (HCC) 05/08/2023   S/P craniotomy 05/08/2023   Brain mass 04/19/2023   Acute metabolic encephalopathy 04/19/2023   Unintentional weight loss 02/23/2023   Memory difficulties 02/23/2023   Dysphagia 02/23/2023   Abdominal pain 02/23/2023   Pancreatitis 02/22/2023   Nausea & vomiting 09/27/2018   Ileus, postoperative (HCC) 09/27/2018   IBS (irritable bowel syndrome) 09/23/2018   Hypomagnesemia 09/20/2018   Cancer of descending colon s/p robotic left hemicolectomy 09/19/2018 08/12/2018   Ventricular tachycardia (HCC) 02/15/2015   PVC's (premature ventricular contractions) 01/20/2015   CORONARY ATHEROSCLEROSIS NATIVE CORONARY ARTERY 08/23/2009   OTHER MALAISE AND FATIGUE 08/23/2009   Essential hypertension, benign 02/22/2009   Diabetes mellitus type 2, noninsulin dependent (HCC) 02/19/2009   Hyperlipidemia 02/19/2009   CAD, ARTERY BYPASS GRAFT 02/19/2009    Behavioral Observation/Mental Status:   NORMAL GRESS  presents as a 77 y.o.-year-old Right  handed Caucasian Male who appeared his stated age. his dress was Appropriate and he was Well Groomed and his manners were Appropriate to the situation.  his participation was indicative of Appropriate, Intrusive, and Redirectable behaviors.  There were physical disabilities noted.  he displayed an appropriate level of cooperation and motivation.    Interactions:    Active Inattentive and Redirectable  Attention:   abnormal and attention span appeared shorter than expected for age  Memory:   abnormal; remote memory intact, recent memory impaired  Visuo-spatial:   not examined  Speech (Volume):  normal  Speech:   normal; some word finding/retrieval issues noted  Thought Process:  Coherent, Circumstantial, and Tangential  Circumstantial, Coherent, and Loose association  Though Content:  WNL; not suicidal and not homicidal  Orientation:   person, place, time/date, and situation  Judgment:   Fair  Planning:   Poor  Affect:    Appropriate  Mood:    Dysphoric  Insight:   Fair  Intelligence:   normal  Psychiatric History:  No prior psychiatric history noted  Family Med/Psych History:  Family History  Problem Relation Age of Onset   Heart attack Father 36   Diabetes Father    Diabetes Mother  Heart attack Paternal Grandfather 49     Impression/DX:   ALEKAI ORSER is a 77 year old male referred for neuropsychological consultation due to coping and adjustment issues as well as cognitive changes after recent bifrontal craniotomy resection at the beginning of July with current admission onto the comprehensive inpatient rehabilitation unit.  Patient has a past medical history including CAD/CABG, hypertension, diabetes, hyperlipidemia as well as colon cancer 2 years ago.  The patient was independent prior to admission.  Patient had presented on 05/08/2023 with altered mental status and mood changes.  CT imaging showed bifrontal extra axial tumor with mass effect.  Patient underwent  planned craniotomy with resection of tumor.  Decadron and Keppra were both added.  Patient had continued with cognitive difficulties consistent with frontal lobe involvement.  Patient has had limited mobility and cognitive changes post neurosurgical interventions.  During today's visit, the patient was oriented and aware of his recent surgery and past medical history.  The patient was engaged but clearly very impulsive.  Patient reported and was clearly noted during our visit significant issues with attention and concentration and some impulse control.  Patient described extended hospital stay and that he was very anxious to be discharged from the hospital.  However, the patient was not clear on what was actually going to happen.  Apparently, the patient's wife is not able to take care of the patient for discharge home and they are still working on discharge planning with possible skilled nursing facility placement at discharge.  Patient was aware that this was a possibility but did appear to be somewhat confused about different parameters.  Patient was aware of difficulties with recall of recently learned information in a timely manner.  However, the memory deficits did not appear to be due to an inability to learn and store/organize new information but more of a retrieval issue.  The patient clearly displayed the ability to learn new information but active retrieval and a noncued/recognition setting was problematic for him.  The patient also had some signs of word finding difficulties but no reduced word knowledge and was able to self-correct with time.  Patient denied any significant depression beyond what he felt was normal response due to extended hospital stay.  Patient was aware of some of his wife's limitations and when we addressed the possibility of discharge to skilled nursing facility did not appear to be overly distressed by this possibility as he is continuing to expect to make further progress post  surgery.  Cognitive issues appear to be frontal lobe in nature consistent with tumor and resection of tumor in his bilateral frontal region.  Impulse control, retrieval of information, information processing challenges etc. are all noted.   Diagnosis:    Mild cognitive impairment due to recent bilateral craniotomy for tumor resection         Electronically Signed   _______________________ Arley Phenix, Psy.D. Clinical Neuropsychologist

## 2023-06-04 NOTE — Progress Notes (Signed)
Patient ID: [name] [name], male   DOB: 1946/05/11, 77 y.o.   MRN: [id number]  [id number] returned phone call to [facility] who requested return phone call to discuss discharge plan. SW spoke with Misty Stanley to discuss pt requires Min Asst with ADLs due to cueing. Reports she will relay  to [facility]. This case  has been escalated to medical director and unsure if a P2P is needed. SW waiting on follow-up.   [name], MSW, LCSWA Office: [phone] Cell: [phone] Fax: [phone]

## 2023-06-04 NOTE — Progress Notes (Signed)
Occupational Therapy Session Note  Patient Details  Name: [name] [name] MRN: [id number] Date of Birth: July 09, 1946  Today's Date: 06/04/2023 OT Individual Time: 1350-1415 OT Individual Time Calculation (min): 25 min  and Today's Date: 06/04/2023 OT Missed Time: 50 Minutes Missed Time Reason: Patient unwilling/refused to participate without medical reason  Short Term Goals: Week 2:  OT Short Term Goal 1 (Week 2): LTG=STG 2/2 ELOS  Skilled Therapeutic Interventions/Progress Updates:    Pt greeted semi-reclined in bed with spouse present. Pt needed max multimodal cues to initiate bed mobility, even though this morning he sat right up out of bed without assistance or cues. Pt able to swing legs towards EOB and started to sit up, but continued to make excuses to changing the subject on why he would not get up. Spouse was present to see behaviors and she also encouraged pt. Continued to work on pt engagement, but he continued to not get up. Pt left semi-recliined in bed with bed alarm on, call bell in reach, and needs met.   Therapy Documentation Precautions:  Precautions Precautions: Fall Restrictions Weight Bearing Restrictions: No General: General OT Amount of Missed Time: 50 Minutes Pain:  Denies pain  Therapy/Group: Individual Therapy  Mal Amabile 06/04/2023, 3:43 PM

## 2023-06-04 NOTE — Progress Notes (Signed)
PROGRESS NOTE   Subjective/Complaints:   Pt reports didn't sleep well- up and down all night-  Denies pain for 2 days- last had HA 2 days ago.  LBM yesterday- went OK.    ROS:   Pt denies SOB, abd pain, CP, N/V/C/D, and vision changes  Except for HPI Objective:   No results found. No results for input(s): "WBC", "HGB", "HCT", "PLT" in the last 72 hours.  No results for input(s): "NA", "K", "CL", "CO2", "GLUCOSE", "BUN", "CREATININE", "CALCIUM" in the last 72 hours.   Intake/Output Summary (Last 24 hours) at 06/04/2023 0830 Last data filed at 06/04/2023 0603 Gross per 24 hour  Intake 500 ml  Output 1650 ml  Net -1150 ml         Physical Exam: Vital Signs Blood pressure (!) 113/95, pulse 93, temperature 98.2 F (36.8 C), temperature source Oral, resp. rate 18, height 5\' 6"  (1.676 m), weight 81.2 kg, SpO2 91%.     General: awake, alert, appropriate, sitting up in bed- NAD HENT: conjugate gaze; oropharynx moist- large bifrontal incision- almost healed CV: regular rate and rhythm; no JVD Pulmonary: CTA B/L; no W/R/R- good air movement GI: soft, NT, ND, (+)BS- hypoactive Psychiatric: appropriate- interactive- flat affect Neurological: alert  Neurologic:   Pt is alert and oriented to person, place, time, and event. Follows basic commands, normal language, speech clear.  motor: Antigravity and against resistance in all 4 extremities equally Sensory exam normal for light touch and pain in all 4 limbs  No abnormal tone noted     Assessment/Plan: 1. Functional deficits which require 3+ hours per day of interdisciplinary therapy in a comprehensive inpatient rehab setting. Physiatrist is providing close team supervision and 24 hour management of active medical problems listed below. Physiatrist and rehab team continue to assess barriers to discharge/monitor patient progress toward functional and medical  goals  Care Tool:  Bathing    Body parts bathed by patient: Right arm, Left arm, Chest, Abdomen, Front perineal area, Right upper leg, Buttocks, Left upper leg, Right lower leg, Left lower leg, Face         Bathing assist Assist Level: Contact Guard/Touching assist     Upper Body Dressing/Undressing Upper body dressing   What is the patient wearing?: Pull over shirt    Upper body assist Assist Level: Set up assist    Lower Body Dressing/Undressing Lower body dressing      What is the patient wearing?: Pants     Lower body assist Assist for lower body dressing: Contact Guard/Touching assist     Toileting Toileting    Toileting assist Assist for toileting: Contact Guard/Touching assist     Transfers Chair/bed transfer  Transfers assist     Chair/bed transfer assist level: Supervision/Verbal cueing     Locomotion Ambulation   Ambulation assist      Assist level: Supervision/Verbal cueing Assistive device: No Device Max distance: 150   Walk 10 feet activity   Assist     Assist level: Supervision/Verbal cueing Assistive device: No Device   Walk 50 feet activity   Assist    Assist level: Supervision/Verbal cueing Assistive device: No Device    Walk  150 feet activity   Assist    Assist level: Supervision/Verbal cueing Assistive device: No Device    Walk 10 feet on uneven surface  activity   Assist     Assist level: Minimal Assistance - Patient > 75%     Wheelchair     Assist Is the patient using a wheelchair?: Yes Type of Wheelchair: Manual    Wheelchair assist level: Dependent - Patient 0% Max wheelchair distance: 150'    Wheelchair 50 feet with 2 turns activity    Assist        Assist Level: Dependent - Patient 0%   Wheelchair 150 feet activity     Assist      Assist Level: Dependent - Patient 0%   Blood pressure (!) 113/95, pulse 93, temperature 98.2 F (36.8 C), temperature source Oral,  resp. rate 18, height 5\' 6"  (1.676 m), weight 81.2 kg, SpO2 91%.    Medical Problem List and Plan: 1. 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 Team continues to work on motivation and encouragement.   -7/27 wife asked about results of EEG which was reviewed.  No seizure activity was noted.  EEG with cortical dysfunction bifrontal region and mild diffuse encephalopathy appears consistent with prior history and I do not think this requires acute intervention.  7/29- con't CIR PT, OT and SLP 2.  Antithrombotics: -DVT/anticoagulation:  Mechanical: Antiembolism stockings, thigh (TED hose) Bilateral lower extremities             -antiplatelet therapy: ASA 81 mg daily  3. Pain Management: Hydrocodone as needed  -7/27 patient with mild occasional headache, advised trying as needed Tylenol.  Patient to call nursing  -7/28 scalp pain/headache has resolved, continue to monitor  7/29- no HA's for "2 days"- will con't reigmen 4. Mood/Behavior/Sleep:               -behavior improving but still unwilling to participate in therapies at times.  -observe for increased agitation/confusion. -maximize sleep/wake cycle              -antipsychotic agents: seroquel 25mg  every day prn agitation/sleep             -trazodone 50mg  at bedtime---will schedule tonight.              -sleep chart--sleep has been inconsistent - >7 hours 7/23-24; good  7/29- didn't sleep well overnight- but had been for 3-4 days prior- will con't regimen for now- wont increase trazodone since risk of urinary retention.  5.  Neuropsych/cognition: This patient is not capable of making decisions on her own behalf.             -namenda 5mg  bid per home dosing  -7/23: D/w neurology regarding ? Seizures and need for EEG prior to starting stimulant (likely modafinil vs. Ritalin) d/t possible seizure risk   - wife and patient would benefit from neuropsych consult     - pending on how he does with Keppra wean, add Ritalin in AM 5 mg  BID  - 7-25-tolerated Keppra wean, adding Ritalin 5 mg twice daily -7-26 therapy engagement and exam appear much improved in terms of attention and speed of processing; memory remains difficult.  Continue Ritalin at current dose  6. Skin/Wound Care: Routine skin checks    - 7/24: Patient scratching incision sites; educated on need to avoid infection, add benadryl cream PRN  7. Fluids/Electrolytes/Nutrition: pt is dry.Marland KitchenMarland Kitchenpush po fluids -recheck bmet 7/23   8 Seizure prophylaxis. keppra 500 mg BID for 14 days post-op - Wife has reported staring spells post-op; ?absence seizures, however some last a long time (>30 min) and none witnessed inpatient -no similar activity reported while inpatient  - 7/23: D/w neurology, no consult needed, ordered EEG and f/u OP with [facility] for 72 hour study after discharge if no findings - 7/24: EEG no epileptiform discharges, generalized findings in frontal area. D/w wife. DC keppra and monitor.    9.Hypothyroid. Synthroid  10.DM.Semglee 60 unit daily and Glucophage 1000 mg bid  -SSI/Monitor while on decadron  - 7/20: BG low this evening  50s; reduce semglee to 50 U   - 7/21: BG increased this evening; increase semglee back to 55 U; if ongoing low AM and high PM might consider splitting dose  -7/22 CBG's better today with recent changes--follow for pattern   - 7/23: AM hypoglycemia again - moving target while weaning steroids - will go back to 50U semglee and allow highs to avoid lows   - 7/24-25: [facility] better controlled with steroid wean;  monitor  -7-26: Semglee back to 40 units daily given low this a.m; will discontinue with stop with steroids on 7-27 and if blood sugars remain elevated resume glipizide 10 mg per home regimen  7/27 will decrease Semglee to 37 units as would like to avoid  Hypoglycemia 7/28 CBG stable continue current regimen 7/29- decreased Semglee to 30 units last evening due to low CBG's- hypoglycemia- will con't to wean since off Decadron   Recent Labs    06/03/23 2103 06/03/23 2132 06/04/23 0543  GLUCAP 66* 73 110*     11.GERD.Protonix 12.Hyperlipidemia.Lipitor 13.HTN.Lopressor 25 mg BID ` -7/28 pressure well-controlled, continue current regimen   7/29- BP controlled- DBP slightly high, but otherwise controlled- con't regimen    06/04/2023    4:38 AM 06/03/2023    7:15 PM 06/03/2023    3:04 PM  Vitals with BMI  Systolic 113 132 409  Diastolic 95 75 73  Pulse 93 73 71   14. Steroids for cerebral edema. Resumed decadron taper on admission to hospital 7/15.  - 7/15-17: 2 mg total) by mouth Q6H - 7/18-21: 2 tablets (2 mg total) - 2 (two) times daily with a meal for 3 days - 7/22-24: tablet (1 mg total) 2 (two) times daily with a meal for 3 days - 7/25-27: 1 tablet (1 mg total) daily with breakfast for 3 days.   15: Urinary incontinence/urgency-likely behavioral component as patient does not notify nursing or staff when he voids -PVRs today, if no improved continence with starting Ritalin for attention/memory, and if PVRs remain low, may start oxybutynin 5 mg twice daily in a.m. -7-26: PVRs low, patient continent overnight, attributes to improved processing and cognition with Ritalin.  No medication adjustments at this time. 7/27 intermittent incontinence however appears to be improving, PVRs 0, continue current regimen 7/28 continent last 24 hours, improved   I spent a total of 37   minutes on total care today- >50% coordination of care- due to  D/w pt about low BG's- and dorpping Semglee-  reading notes over weekend- labs pending.   LOS: 10 days A FACE TO FACE EVALUATION WAS PERFORMED  Wilmoth Rasnic 06/04/2023, 8:30 AM

## 2023-06-05 ENCOUNTER — Inpatient Hospital Stay (HOSPITAL_COMMUNITY): Payer: Medicare Other

## 2023-06-05 DIAGNOSIS — R0602 Shortness of breath: Secondary | ICD-10-CM

## 2023-06-05 LAB — GLUCOSE, CAPILLARY
Glucose-Capillary: 69 mg/dL — ABNORMAL LOW (ref 70–99)
Glucose-Capillary: 79 mg/dL (ref 70–99)
Glucose-Capillary: 91 mg/dL (ref 70–99)
Glucose-Capillary: 216 mg/dL — ABNORMAL HIGH (ref 70–99)
Glucose-Capillary: 169 mg/dL — ABNORMAL HIGH (ref 70–99)

## 2023-06-05 MED ORDER — INSULIN GLARGINE-YFGN 100 UNIT/ML ~~LOC~~ SOLN
13.0000 [IU] | Freq: Every day | SUBCUTANEOUS | Status: DC
  Administered 2023-06-06 – 2023-06-07 (×2): 13 [IU] via SUBCUTANEOUS
  Filled 2023-06-05 (×2): qty 0.13

## 2023-06-05 MED ORDER — INSULIN GLARGINE-YFGN 100 UNIT/ML ~~LOC~~ SOLN: 15.0000 [IU] | Freq: Every day | SUBCUTANEOUS | Status: DC

## 2023-06-05 NOTE — Progress Notes (Signed)
Speech Language Pathology Weekly Progress and Session Note  Patient Details  Name: [name] [name] MRN: [id number] Date of Birth: 08/22/1946  Beginning of progress report period: May 26, 2023 End of progress report period: June 05, 2023  Today's Date: 06/05/2023 SLP Individual Time: 4098-1191 SLP Individual Time Calculation (min): 60 min  Short Term Goals: Week 1: SLP Short Term Goal 1 (Week 1): Patient will demonstrate sustained attention to therapeutic task for >19minutes with min verbal cues. SLP Short Term Goal 1 - Progress (Week 1): Met SLP Short Term Goal 2 (Week 1): Patient will recall 3 external memory strategies for use at home with min verbal cues. SLP Short Term Goal 2 - Progress (Week 1): Not met SLP Short Term Goal 3 (Week 1): Patient will demonstrate increased awareness of impairments by self-correcting on therapeutic task with minA multimodal cues. SLP Short Term Goal 3 - Progress (Week 1): Not met    New Short Term Goals: Week 2: SLP Short Term Goal 1 (Week 2): STGs = LTGs 2* ELOS  Weekly Progress Updates:  Pt demonstrates slight progress towards ST goals this week as he met 1/3 STGs. He demonstrates improved word finding, thought formulation, orientation, short term memory, and attention, though mild to moderate deficits remain overall at this time. He demonstrates emerging success w/ his recall goal, will continue to target. Discontinue awareness goal at this time given baseline pt behaviors and overall participation. Pt/family education ongoing and he would benefit from continued ST services to target remaining cognitive-linguistic deficits to facilitate return to cognitive baseline (suspected dementia).    Intensity: Minumum of 1-2 x/day, 30 to 90 minutes Frequency: 3 to 5 out of 7 days Duration/Length of Stay: 06/08/23 Treatment/Interventions: Cognitive remediation/compensation;Internal/external aids;Cueing hierarchy;Environmental controls;Therapeutic  Activities;Functional tasks;Patient/family education;Therapeutic Exercise;Speech/Language facilitation   Daily Session  Skilled Therapeutic Interventions: Pt greeted in bed, asleep upon SLP arrival. He woke easily and was agreeable to tx tasks targeting cognition and verbal expression. During visual sequencing task, he was able to sequence 6 steps to complete ADL/IADL task pictured w/ minA visual/verbal cues. Limited error awareness throughout task, though slightly increased self correction noted this date. He INDly utilized external memory aid (calendar) to ensure accurate report of today's orientation information. He benefited from minA verbal cues for word finding 2* anomic errors during mildly specific responsive naming task. He intermittently required redirections back to task, but was able to sustain attention for ~15 mins at a time w/ only supervision verbal cues despite mildly distracting environment.     Pain  No pain reported  Therapy/Group: Individual Therapy  [name] 06/05/2023, 4:05 PM

## 2023-06-05 NOTE — Progress Notes (Signed)
Physical Therapy Session Note  Patient Details  Name: SABA BOESE MRN: [id number] Date of Birth: [birth year]  Today's Date: 06/05/2023 PT Individual Time: 0915-1030 PT Individual Time Calculation (min): 75 min   Short Term Goals: Week 2:  PT Short Term Goal 1 (Week 2): STGs=LTGs secondary to ELOS  Skilled Therapeutic Interventions/Progress Updates:  Patient greeted sitting upright in bedside recliner in his room and agreeable to PT treatment session. Patient required increased time to initiate standing from the recliner, however was able to eventually stand with Supv for safety. Patient gait trained to/from his room and day room without the use of an AD and Supv for safety with no LOB noted. Patient participated in bowling and baseball on the Wii while standing on airex foam pad and seated. Activity performed in order to improve overall dynamic stability, as well as engagement in therapy session and ability to sustain attention with tasks. Patient returned to his room and left supine in bed with bed alarm on, call bell within reach and all needs met.    Therapy Documentation Precautions:  Precautions Precautions: Fall Restrictions Weight Bearing Restrictions: No  Pain: No/Denies pain.    Therapy/Group: Individual Therapy  Sharolyn Weber 06/05/2023, 7:49 AM

## 2023-06-05 NOTE — Progress Notes (Signed)
Hypoglycemic Event  CBG: 69  Treatment: 8 oz juice/soda  Symptoms: None  Follow-up CBG: [id number] CBG Result:79  Possible Reasons for Event: Unknown  Comments/MD notified:Pt eating breakfast    Thereasa Parkin

## 2023-06-05 NOTE — Progress Notes (Signed)
Patient ID: CALAIS MALSCH, male   DOB: Sep 24, 1946, 77 y.o.   MRN: [id number]  SW received message (7/29 at 1647pm) from [place] with [facility] requesting P2P with MD. Call [phone] option #5 by 12 noon on 7/30.  SW updated covering attending.   1140-SW returned phone call to [facility] with [facility] reporting that MD declined SNF placement reporting pt   [name], MSW, [place] Office: [phone] Cell: [phone] Fax: [phone]

## 2023-06-05 NOTE — Progress Notes (Signed)
Occupational Therapy Session Note  Patient Details  Name: [name] [name] MRN: [id number] Date of Birth: 11/10/1945  Today's Date: 06/05/2023 OT Individual Time: 7829-5621 OT Individual Time Calculation (min): 75 min    Short Term Goals: Week 2:  OT Short Term Goal 1 (Week 2): LTG=STG 2/2 ELOS  Skilled Therapeutic Interventions/Progress Updates:    Pt greeted semi-reclined in bed awake and agreeable to OT treatment session. Pt apologized for behavior yesterday and ready to get up. Pt agreeable to shower today. Pt got up and collected items for shower with supervision. He then returned to sit EOB but stated he forgot why he came back in here and needed more time and cues to get back up to go into shower. Pt stood and voided prior to shower and neeed min cues for safety throughout shower, but able to complete with overall supervision. Dressing tasks completed with increased time and supervision with min cues for safe technique. Discussed importance of safety and risk of falls. Pt stood to shave without LOB and overall supervision. Pt reported some mild shortness of breath. SpO2 98%. Nursing notified and assessed.  OT had pt write out in memory notebook what we did this morning. Pt left seated in recliner with alarm belt on, call bell in reach, and needs met.   Therapy Documentation Precautions:  Precautions Precautions: Fall Restrictions Weight Bearing Restrictions: No Pain:  Denies pain   Therapy/Group: Individual Therapy  Mal Amabile 06/05/2023, 7:59 AM

## 2023-06-05 NOTE — Progress Notes (Signed)
PROGRESS NOTE   Subjective/Complaints:   Patient is disappointed he is unable to discharge today.  Reports he feels a little short of breath and anxious today.     ROS:  Pt denies  abd pain, CP, N/V/C/D, and vision changes + Anxiety and shortness of breath    Except for HPI Objective:   No results found. No results for input(s): "WBC", "HGB", "HCT", "PLT" in the last 72 hours.  No results for input(s): "NA", "K", "CL", "CO2", "GLUCOSE", "BUN", "CREATININE", "CALCIUM" in the last 72 hours.   Intake/Output Summary (Last 24 hours) at 06/05/2023 1342 Last data filed at 06/05/2023 0817 Gross per 24 hour  Intake 237 ml  Output 900 ml  Net -663 ml         Physical Exam: Vital Signs Blood pressure (!) 110/59, pulse 67, temperature 98.1 F (36.7 C), resp. rate 17, height 5\' 6"  (1.676 m), weight 81.2 kg, SpO2 94%.     General: awake, alert, appropriate, sitting up in bed- NAD HENT: conjugate gaze; oropharynx moist- large bifrontal incision- almost healed CV: regular rate and rhythm; no JVD Pulmonary: CTA B/L; no W/R/R- good air movement GI: soft, NT, ND, (+)BS- hypoactive Psychiatric: appropriate- interactive- flat affect Neurological: alert  Neurologic:   Pt is alert and oriented to person, place, time, and event. Follows basic commands, normal language, speech clear.  motor: Antigravity and against resistance in all 4 extremities equally Sensory exam normal for light touch and pain in all 4 limbs  No abnormal tone noted     Assessment/Plan: 1. Functional deficits which require 3+ hours per day of interdisciplinary therapy in a comprehensive inpatient rehab setting. Physiatrist is providing close team supervision and 24 hour management of active medical problems listed below. Physiatrist and rehab team continue to assess barriers to discharge/monitor patient progress toward functional and medical  goals  Care Tool:  Bathing    Body parts bathed by patient: Right arm, Left arm, Chest, Abdomen, Front perineal area, Right upper leg, Buttocks, Left upper leg, Right lower leg, Left lower leg, Face         Bathing assist Assist Level: Supervision/Verbal cueing     Upper Body Dressing/Undressing Upper body dressing   What is the patient wearing?: Pull over shirt    Upper body assist Assist Level: Independent    Lower Body Dressing/Undressing Lower body dressing      What is the patient wearing?: Pants     Lower body assist Assist for lower body dressing: Supervision/Verbal cueing     Toileting Toileting    Toileting assist Assist for toileting: Supervision/Verbal cueing     Transfers Chair/bed transfer  Transfers assist     Chair/bed transfer assist level: Supervision/Verbal cueing     Locomotion Ambulation   Ambulation assist      Assist level: Supervision/Verbal cueing Assistive device: No Device Max distance: 150   Walk 10 feet activity   Assist     Assist level: Supervision/Verbal cueing Assistive device: No Device   Walk 50 feet activity   Assist    Assist level: Supervision/Verbal cueing Assistive device: No Device    Walk 150 feet activity   Assist  Assist level: Supervision/Verbal cueing Assistive device: No Device    Walk 10 feet on uneven surface  activity   Assist     Assist level: Minimal Assistance - Patient > 75%     Wheelchair     Assist Is the patient using a wheelchair?: Yes Type of Wheelchair: Manual    Wheelchair assist level: Dependent - Patient 0% Max wheelchair distance: 150'    Wheelchair 50 feet with 2 turns activity    Assist        Assist Level: Dependent - Patient 0%   Wheelchair 150 feet activity     Assist      Assist Level: Dependent - Patient 0%   Blood pressure (!) 110/59, pulse 67, temperature 98.1 F (36.7 C), resp. rate 17, height 5\' 6"  (1.676 m),  weight 81.2 kg, SpO2 94%.    Medical Problem List and Plan: 1. 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 Team continues to work on motivation and encouragement.   -7/27 wife asked about results of EEG which was reviewed.  No seizure activity was noted.  EEG with cortical dysfunction bifrontal region and mild diffuse encephalopathy appears consistent with prior history and I do not think this requires acute intervention.  7/29- con't CIR PT, OT and SLP  -7/30 peer-to-peer completed regarding SNF placement.  This was denied 2.  Antithrombotics: -DVT/anticoagulation:  Mechanical: Antiembolism stockings, thigh (TED hose) Bilateral lower extremities             -antiplatelet therapy: ASA 81 mg daily  3. Pain Management: Hydrocodone as needed  -7/27 patient with mild occasional headache, advised trying as needed Tylenol.  Patient to call nursing  -7/28 scalp pain/headache has resolved, continue to monitor  7/29- no HA's for "2 days"- will con't reigmen 4. Mood/Behavior/Sleep:               -behavior improving but still unwilling to participate in therapies at times.  -observe for increased agitation/confusion. -maximize sleep/wake cycle              -antipsychotic agents: seroquel 25mg  every day prn agitation/sleep             -trazodone 50mg  at bedtime---will schedule tonight.              -sleep chart--sleep has been inconsistent - >7 hours 7/23-24; good  7/29- didn't sleep well overnight- but had been for 3-4 days prior- will con't regimen for now- wont increase trazodone since risk of  urinary retention.  5. Neuropsych/cognition: This patient is not capable of making decisions on her own behalf.             -namenda 5mg  bid per home dosing  -7/23: D/w neurology regarding ? Seizures and need for EEG prior to starting stimulant (likely modafinil vs. Ritalin) d/t possible seizure risk   - wife and patient would benefit from neuropsych consult     - pending on how he does with Keppra wean, add Ritalin in AM 5 mg BID  -  7-25-tolerated Keppra wean, adding Ritalin 5 mg twice daily -7-26 therapy engagement and exam appear much improved in terms of attention and speed of processing; memory remains difficult.  Continue Ritalin at current dose  6. Skin/Wound Care: Routine skin checks    - 7/24: Patient scratching incision sites; educated on need to avoid infection, add benadryl cream PRN  7. Fluids/Electrolytes/Nutrition: pt is dry.Marland KitchenMarland Kitchenpush po fluids -recheck bmet 7/23   8 Seizure prophylaxis. keppra 500 mg BID for 14 days post-op - Wife has reported staring spells post-op; ?absence seizures, however some last a long time (>30 min) and none witnessed inpatient -no similar activity reported while inpatient  - 7/23: D/w neurology, no consult needed, ordered EEG and f/u OP with [facility] for 72 hour study after discharge if no findings - 7/24: EEG no epileptiform discharges, generalized findings in frontal area. D/w wife. DC keppra and monitor.    9.Hypothyroid. Synthroid  10.DM.Semglee 60 unit daily and Glucophage 1000 mg bid  -SSI/Monitor while on decadron  - 7/20: BG low this evening  50s; reduce semglee to 50 U   - 7/21: BG increased this evening; increase semglee back to 55 U; if ongoing low AM and high PM might consider splitting dose  -7/22 CBG's better today with recent changes--follow for pattern   - 7/23: AM hypoglycemia again - moving target while weaning steroids - will go back to 50U semglee and allow highs to avoid lows   - 7/24-25: [facility] better controlled with  steroid wean; monitor  -7-26: Semglee back to 40 units daily given low this a.m; will discontinue with stop with steroids on 7-27 and if blood sugars remain elevated resume glipizide 10 mg per home regimen  7/27 will decrease Semglee to 37 units as would like to avoid  Hypoglycemia 7/28 CBG stable continue current regimen 7/29- decreased Semglee to 30 units last evening due to low CBG's- hypoglycemia- will con't to wean since off Decadron -7/30 hypoglycemic this morning, will decrease to 13 units   Recent Labs    06/05/23 0627 06/05/23 0649 06/05/23 1206  GLUCAP 69* 79 91     11.GERD.Protonix 12.Hyperlipidemia.Lipitor 13.HTN.Lopressor 25 mg BID ` -7/28 pressure well-controlled, continue current regimen   7/29- BP controlled- DBP slightly high, but otherwise controlled- con't regimen  -7/30 blood pressure controlled continue current regimen    06/05/2023    5:26 AM 06/04/2023    7:42 PM 06/04/2023    2:23 PM  Vitals with BMI  Systolic 110 126 161  Diastolic 59 65 70  Pulse 67 78 94   14. Steroids for cerebral edema. Resumed decadron taper on admission to hospital 7/15.  - 7/15-17: 2 mg total) by mouth Q6H - 7/18-21: 2 tablets (2 mg total) - 2 (two) times daily with a meal for 3 days - 7/22-24: tablet (1 mg total) 2 (two) times daily with a meal for 3 days - 7/25-27: 1 tablet (1 mg total) daily with breakfast for 3 days.   15: Urinary incontinence/urgency-likely behavioral component as patient does not notify nursing or staff when he voids -PVRs today, if no improved continence with starting Ritalin for attention/memory, and if PVRs remain low, may start oxybutynin 5 mg twice daily in a.m. -7-26: PVRs low, patient continent overnight, attributes to improved processing and cognition with Ritalin.  No medication adjustments at this time. 7/27 intermittent incontinence however appears to be improving, PVRs 0, continue current regimen 7/28 continent last 24 hours, improved 7/30  remains continent  16.  Shortness of breath.  Vital stable  -Possibly related to anxiety, will check chest x-ray    LOS: 11 days A FACE TO FACE EVALUATION WAS PERFORMED  Fanny Dance 06/05/2023, 1:42 PM

## 2023-06-05 NOTE — Patient Care Conference (Signed)
Inpatient RehabilitationTeam Conference and Plan of Care Update Date: 06/05/2023   Time: 10:50 AM    Patient Name: [name] [name]      Medical Record Number: [id number]  Date of Birth: [birth year] Sex: Male         Room/Bed: 4W19C/4W19C-01 Payor Info: Payor: [employer]ICARE / Plan: BCBS MEDICARE / Product Type: *No Product type* /    Admit Date/Time:  05/25/2023  5:51 PM  Primary Diagnosis:  Meningioma [facility])  Hospital Problems: Principal Problem:   Meningioma (HCC) Active Problems:   Mild neurocognitive disorder due to another medical condition    Expected Discharge Date: Expected Discharge Date:  (SNF)  Team Members Present: Physician leading conference: Dr. Faith Rogue Social Worker Present: [name], LCSWA Nurse Present: Vedia Pereyra, RN PT Present: Amedeo Plenty, PT OT Present: Kearney Hard, OT SLP Present: Feliberto Gottron, SLP PPS Coordinator present : Fae Pippin, SLP     Current Status/Progress Goal Weekly Team Focus  Bowel/Bladder   Pt is continent of bowel/bladder   Pt will remain continent of bowel/bladder   Will assess qshift and PRN    Swallow/Nutrition/ Hydration               ADL's   Supervision   mod I/S   Spouse unable to physically assist, doing well with supevrision for BADL tasks    Mobility   Currently supv for everything- Waiting for SNF placement   Supervision  Continuing to work on initiation with all functional mobility in order to improve independence    Communication   mild to moderate expressive deficits due to initiation and word finding deficits - success highly variable given behavior and reduced participation overall   express mildly complex info w/ supervision   mildly complex conversational tasks and structured naming tasks, comprehension of instructions and information re POC    Safety/Cognition/ Behavioral Observations  mild to moderate attention, problem solving, and reasoning deficits -  moderate recall deficits. Again overall variable given pt participation/behavior that day   consistent minA for problem solving/attention, use of compensatory strategies w/ modA   verbal and functional problem solving tasks, use of external memory aids to assist w/ recall    Pain   Pt denies pain   Pt will continue to deny pain   Will assess qshift and PRN    Skin   Pt's surgical incision to head is healing   Pt's surgical incision will continue to heal  Will assess qshift and PRN      Discharge Planning:  SNF pending insurance approval. If not, D/c plan will be to d/c to home with wife who is primary caregiver. Wife is not able to provide any physical assistance due to physical ailments. SW will confirm there are no barriers to discharge.   Team Discussion: Meningioma. Time toileting.  PVRs good. Denies pain. Incision to head is healed. Last few blood sugars have been low. Carb/mod/thin diet. . Sleep chart. Agitation improving. Biggest barrier is mood/behavior and caregiver support.  Patient on target to meet rehab goals: yes, progressing towards goals   *See Care Plan and progress notes for long and short-term goals.   Revisions to Treatment Plan:  Peer to Peer pending.  Semglee adjusted.  Chest xray.  Teaching Needs: Medications, safety, self care, gait/transfer training, etc.   Current Barriers to Discharge: Lack of/limited family support and Behavior  Possible Resolutions to Barriers: Family education Caregiver support Minimal cueing or behaviors  Medical Summary Current Status: Large bi frontal axial tumor, shortness of breath,DM2, HTN  Barriers to Discharge: Other (comments)  Barriers to Discharge Comments: Large bi frontal axial tumor, shortness of breath,DM2, HTN, SNF Possible Resolutions to [employer] Focus: Peer to peer, CXR   Continued Need for Acute Rehabilitation Level of Care: The patient requires daily medical management by a physician with  specialized training in physical medicine and rehabilitation for the following reasons: Direction of a multidisciplinary physical rehabilitation program to maximize functional independence : Yes Medical management of patient stability for increased activity during participation in an intensive rehabilitation regime.: Yes Analysis of laboratory values and/or radiology reports with any subsequent need for medication adjustment and/or medical intervention. : Yes   I attest that I was present, lead the team conference, and concur with the assessment and plan of the team.   Jearld Adjutant 06/05/2023, 2:36 PM

## 2023-06-06 ENCOUNTER — Other Ambulatory Visit (HOSPITAL_COMMUNITY): Payer: Self-pay

## 2023-06-06 ENCOUNTER — Telehealth (HOSPITAL_COMMUNITY): Payer: Self-pay | Admitting: Pharmacy Technician

## 2023-06-06 LAB — GLUCOSE, CAPILLARY
Glucose-Capillary: 136 mg/dL — ABNORMAL HIGH (ref 70–99)
Glucose-Capillary: 123 mg/dL — ABNORMAL HIGH (ref 70–99)
Glucose-Capillary: 229 mg/dL — ABNORMAL HIGH (ref 70–99)
Glucose-Capillary: 115 mg/dL — ABNORMAL HIGH (ref 70–99)

## 2023-06-06 MED ORDER — NITROGLYCERIN 0.4 MG SL SUBL
0.4000 mg | SUBLINGUAL_TABLET | SUBLINGUAL | 0 refills | Status: AC | PRN
  Filled 2023-06-06: qty 25, 8d supply, fill #0

## 2023-06-06 MED ORDER — ATORVASTATIN CALCIUM 10 MG PO TABS: 10.0000 mg | ORAL_TABLET | Freq: Every day | ORAL | 0 refills | Status: DC

## 2023-06-06 MED ORDER — DOCUSATE SODIUM 100 MG PO CAPS: 100.0000 mg | ORAL_CAPSULE | Freq: Two times a day (BID) | ORAL | Status: AC

## 2023-06-06 MED ORDER — METFORMIN HCL 1000 MG PO TABS
1000.0000 mg | ORAL_TABLET | Freq: Two times a day (BID) | ORAL | 3 refills | Status: AC
  Filled 2023-06-06 (×2): qty 60, 30d supply, fill #0

## 2023-06-06 MED ORDER — METOPROLOL TARTRATE 25 MG PO TABS
25.0000 mg | ORAL_TABLET | Freq: Every day | ORAL | 0 refills | Status: DC
  Filled 2023-06-06 (×2): qty 30, 30d supply, fill #0

## 2023-06-06 MED ORDER — METOPROLOL TARTRATE 25 MG PO TABS: 25.0000 mg | ORAL_TABLET | Freq: Every day | ORAL | 0 refills | Status: DC

## 2023-06-06 MED ORDER — SOLIFENACIN SUCCINATE 10 MG PO TABS
10.0000 mg | ORAL_TABLET | Freq: Every day | ORAL | 0 refills | Status: AC
  Filled 2023-06-06 (×2): qty 30, 30d supply, fill #0

## 2023-06-06 MED ORDER — HYDROCODONE-ACETAMINOPHEN 5-325 MG PO TABS: 1.0000 | ORAL_TABLET | ORAL | 0 refills | Status: DC | PRN

## 2023-06-06 MED ORDER — HYDROCODONE-ACETAMINOPHEN 5-325 MG PO TABS
1.0000 | ORAL_TABLET | ORAL | 0 refills | Status: DC | PRN
Start: 2023-06-06 — End: 2023-06-19
  Filled 2023-06-06: qty 30, 3d supply, fill #0
  Filled 2023-06-06: qty 30, 4d supply, fill #0

## 2023-06-06 MED ORDER — METHYLPHENIDATE HCL 5 MG PO TABS
5.0000 mg | ORAL_TABLET | Freq: Two times a day (BID) | ORAL | 0 refills | Status: DC
Start: 2023-06-06 — End: 2023-06-19
  Filled 2023-06-06 (×2): qty 60, 30d supply, fill #0

## 2023-06-06 MED ORDER — TRAZODONE HCL 50 MG PO TABS
50.0000 mg | ORAL_TABLET | Freq: Every day | ORAL | 0 refills | Status: DC
  Filled 2023-06-06 (×2): qty 30, 30d supply, fill #0

## 2023-06-06 MED ORDER — VITAMIN D3 50 MCG (2000 UT) PO TABS
2000.0000 [IU] | ORAL_TABLET | Freq: Every day | ORAL | 0 refills | Status: DC
  Filled 2023-06-06: qty 30, 30d supply, fill #0

## 2023-06-06 MED ORDER — VITAMIN B-12 1000 MCG PO TABS: 1000.0000 ug | ORAL_TABLET | Freq: Every day | ORAL | 0 refills | Status: DC

## 2023-06-06 MED ORDER — ATORVASTATIN CALCIUM 10 MG PO TABS
10.0000 mg | ORAL_TABLET | Freq: Every day | ORAL | 0 refills | Status: DC
  Filled 2023-06-06 (×2): qty 30, 30d supply, fill #0

## 2023-06-06 MED ORDER — SOLIFENACIN SUCCINATE 10 MG PO TABS: 10.0000 mg | ORAL_TABLET | Freq: Every day | ORAL | 0 refills | Status: DC

## 2023-06-06 MED ORDER — MEMANTINE HCL 5 MG PO TABS: 5.0000 mg | ORAL_TABLET | Freq: Two times a day (BID) | ORAL | 0 refills | Status: DC

## 2023-06-06 MED ORDER — LEVOTHYROXINE SODIUM 75 MCG PO TABS
75.0000 ug | ORAL_TABLET | Freq: Every morning | ORAL | 0 refills | Status: AC
  Filled 2023-06-06 (×2): qty 30, 30d supply, fill #0

## 2023-06-06 MED ORDER — PANTOPRAZOLE SODIUM 40 MG PO TBEC: 40.0000 mg | DELAYED_RELEASE_TABLET | Freq: Every day | ORAL | 0 refills | Status: DC

## 2023-06-06 MED ORDER — ADULT MULTIVITAMIN W/MINERALS CH: 1.0000 | ORAL_TABLET | Freq: Every day | ORAL | Status: AC

## 2023-06-06 MED ORDER — TRAZODONE HCL 50 MG PO TABS: 50.0000 mg | ORAL_TABLET | Freq: Every day | ORAL | 0 refills | Status: DC

## 2023-06-06 MED ORDER — PANTOPRAZOLE SODIUM 40 MG PO TBEC
40.0000 mg | DELAYED_RELEASE_TABLET | Freq: Every day | ORAL | 0 refills | Status: AC
  Filled 2023-06-06 (×2): qty 30, 30d supply, fill #0

## 2023-06-06 MED ORDER — INSULIN GLARGINE 100 UNIT/ML SOLOSTAR PEN: 13.0000 [IU] | PEN_INJECTOR | Freq: Every day | SUBCUTANEOUS | 11 refills | Status: DC

## 2023-06-06 MED ORDER — LEVOTHYROXINE SODIUM 75 MCG PO TABS: 75.0000 ug | ORAL_TABLET | Freq: Every morning | ORAL | 0 refills | Status: DC

## 2023-06-06 MED ORDER — VITAMIN D3 50 MCG (2000 UT) PO TABS: 2000.0000 [IU] | ORAL_TABLET | Freq: Every day | ORAL | 0 refills | Status: DC

## 2023-06-06 MED ORDER — MEMANTINE HCL 5 MG PO TABS
5.0000 mg | ORAL_TABLET | Freq: Two times a day (BID) | ORAL | 0 refills | Status: DC
Start: 2023-06-06 — End: 2023-09-05
  Filled 2023-06-06: qty 60, 30d supply, fill #0

## 2023-06-06 MED ORDER — INSULIN GLARGINE 100 UNIT/ML SOLOSTAR PEN
13.0000 [IU] | PEN_INJECTOR | Freq: Every day | SUBCUTANEOUS | 11 refills | Status: AC
  Filled 2023-06-06: qty 15, 115d supply, fill #0
  Filled 2023-06-06: qty 9, 69d supply, fill #0

## 2023-06-06 MED ORDER — METFORMIN HCL 1000 MG PO TABS: 1000.0000 mg | ORAL_TABLET | Freq: Two times a day (BID) | ORAL | 3 refills | Status: DC

## 2023-06-06 MED ORDER — VITAMIN B-12 1000 MCG PO TABS
1000.0000 ug | ORAL_TABLET | Freq: Every day | ORAL | 0 refills | Status: AC
  Filled 2023-06-06: qty 30, 30d supply, fill #0

## 2023-06-06 MED ORDER — METHYLPHENIDATE HCL 5 MG PO TABS: 5.0000 mg | ORAL_TABLET | Freq: Two times a day (BID) | ORAL | 0 refills | Status: DC

## 2023-06-06 NOTE — Progress Notes (Signed)
Inpatient Rehabilitation Discharge Medication Review by a Pharmacist  A complete drug regimen review was completed for this patient to identify any potential clinically significant medication issues.  High Risk Drug Classes Is patient taking? Indication by Medication  Antipsychotic {Receiving?:26196}   Anticoagulant {Receiving?:26196}   Antibiotic {Receiving?:26196}   Opioid {Receiving?:26196}   Antiplatelet {Receiving?:26196}   Hypoglycemics/insulin {Yes or No?:26198}   Vasoactive Medication {Receiving?:26196}   Chemotherapy {Receiving Chemo?:26197}   Other {Yes or No?:26198}      Type of Medication Issue Identified Description of Issue Recommendation(s)  Drug Interaction(s) (clinically significant)     Duplicate Therapy     Allergy     No Medication Administration End Date     Incorrect Dose     Additional Drug Therapy Needed     Significant med changes from prior encounter (inform family/care partners about these prior to discharge).  Restart or discontinue as appropriate. Communicate medication changes with patient/family at discharge  Other       Clinically significant medication issues were identified that warrant physician communication and completion of prescribed/recommended actions by midnight of the next day:  {Yes or No?:26198}  Name of provider notified for urgent issues identified: ***   Provider Method of Notification: ***    Pharmacist comments: ***   Time spent performing this drug regimen review (minutes): 30   Thank you for allowing pharmacy to be a part of this patient's care.   Signe Colt, PharmD 06/06/2023 3:32 PM    **Pharmacist phone directory can be found on [email or web address] listed under [facility]**

## 2023-06-06 NOTE — Progress Notes (Signed)
Speech Language Pathology Daily Session Note  Patient Details  Name: [name] [name] MRN: [id number] Date of Birth: 12/24/1945  Today's Date: 06/06/2023 SLP Missed Time: 20 Minutes Missed Time Reason:  (SLP schedule conflict)  Short Term Goals: Week 2: SLP Short Term Goal 1 (Week 2): STGs = LTGs 2* ELOS  Skilled Therapeutic Interventions:   Pt seen for skilled SLP tx to address functional cognitive goals. Pt needed mod-max cues to maintain attention to all tasks today, as he is easily internally and externally distracted. He required min cues to complete safety awareness tasks of identifying various safety hazards in pictured scene. Min-mod cues needed to complete card game involving sequence from 1-10. Cues titrated as game progressed. He and his wife use to play cards and enjoyed this exercise.   Pt left in bed with bed alarm activated and call bell in reach. Wife at bedside. Continue SLP PoC.   Pain Pain Assessment Pain Scale: 0-10 Pain Score: 0-No pain  Therapy/Group: Individual Therapy  Ellery Plunk 06/06/2023, 12:04 PM

## 2023-06-06 NOTE — Progress Notes (Addendum)
Patient ID: [name] [name], male   DOB: 10/17/1946, 77 y.o.   MRN: [id number]  [phone]- SW returned phone call to pt dtr [name] [phone]) who was inquiring about plan of care for patient, and indicated this being an unsafe discharge plan for her mother, explaining that her mother has osteoarthritis. She reports that she called the appeal line yesterday and was informed that she was told MD or pt has to appeal. SW reviewed updates from insurance informing that family must now appeal declined SNF decision. She continues to report she does not think this is  safe discharge plan for her mother. SW shared that a sitter list was provided after her mother's continued concerns about not having support and her mother stated she was not able to afford. SW reiterated that pt is physically doing well, however remains behavioral (cognition related issues). She would like to speak with a medical doctor to discuss his care needs. SW shared if her mother does not feel she is able to take him home, SW will submit APS referral as all options have been exhausted if they choose not to appeal. Dtr would like to keep pt here until [facility] application is approved. SW shared this would not be an option, however, reiterated for her to call to appeal the SNF decision, SW will follow-up with medical team requesting follow-up, and SW will submit APS referral.   1003-SW returned phone call and left message for pt wife [name] to inquire if she intends to appeal SNF decision with insurance. SW informed insurance has approved for pt to stay through 8/2 at this time. SW also informed HHA has been put in place since she does not drive. SW waiting on follow-up.   1343- SW returned phone call to pt dtr [name] who inquired about HHA. SW shared secured HHA to ensure services were in place pending appeal decision. Amenable to HHA in place. Family has decided not to appeal. SW will confirm with attending when pt can discharge.   Attending amenable to  pt d/c tomorrow.   1352- SW returned call to pt dtr [name] to inform on attending agreeable to d/c tomorrow. Would like if HHA can follow-up with her .   SW spoke with [name]/[facility] to request her follow-p with pt/. SW will HHSW order. Current orders will be for HHPT/OT/SLP/aide/SW. SOC will be Friday.   SW updated [name]/Admissions with [facility] to inform on d/c to home now.   [name], MSW, LCSWA Office: [phone] Cell: [phone] Fax: [phone]

## 2023-06-06 NOTE — Progress Notes (Signed)
Occupational Therapy Session Note  Patient Details  Name: [name] [name] MRN: [id number] Date of Birth: 15-Jun-1946  Today's Date: 06/06/2023 OT Individual Time: 0735-       Short Term Goals: Week 2:  OT Short Term Goal 1 (Week 2): LTG=STG 2/2 ELOS  Skilled Therapeutic Interventions/Progress Updates:    Patient received supine and awake in bed.  Patient slightly irritated that he was not leaving today.  Discussed with patient that his discharge was delayed because discharge plan had potentially changed to SNF.  However patient reports he does not have coverage for SNF and will have to go home.   Patient continues to talk about leaving today throughout session.  Sat at edge of bed and ate breakfast.  Patient needed cueing for each step of breakfast, e.g. cutting up banana and adding to cereal, returning to eating after talking.  Once engaged in task - completes without assistance.   Walked to bathroom, declined need to void.  Able to remove clothes, shower, and dress himself with minimal verbal cueing, coaxing, directing.  Patient with limited awareness of deficits, rationale for rehab.  Overall verbalizing frustration over long hospitalization.   Patient returned to bed at end of session for 2 hour timeframe before next session.  Bed alarm engaged and call bell/ personal items in reach.     Therapy Documentation Precautions:  Precautions Precautions: Fall Restrictions Weight Bearing Restrictions: No  Pain:  Denies pain        Therapy/Group: Individual Therapy  Collier Salina 06/06/2023, 7:52 AM

## 2023-06-06 NOTE — Progress Notes (Signed)
Occupational Therapy Discharge Summary  Patient Details  Name: [name] [name] MRN: [id number] Date of Birth: 1946/01/18  Date of Discharge from OT service:{Time; dates multiple:304500300}  {CHL IP REHAB OT TIME CALCULATIONS:304400400}   Patient has met {NUMBERS 0-12:18577} of {NUMBERS 0-12:18577} long term goals due to {due NW:2956213}.  Patient to discharge at overall {LOA:3049010} level.  Patient's care partner {care partner:3041650} to provide the necessary {assistance:3041652} assistance at discharge.    Reasons goals not met: ***  Recommendation:  Patient will benefit from ongoing skilled OT services in {setting:3041680} to continue to advance functional skills in the area of {ADL/iADL:3041649}.  Equipment: {equipment:3041657}  Reasons for discharge: {Reason for discharge:3049018}  Patient/family agrees with progress made and goals achieved: {Pt/Family agree with progress/goals:3049020}  OT Discharge Precautions/Restrictions    General   Vital Signs Therapy Vitals Temp: 98.2 F (36.8 C) Pulse Rate: 79 BP: 131/73 Patient Position (if appropriate): Lying Oxygen Therapy SpO2: 96 % O2 Device: Room Air Pain   ADL ADL Eating: Minimal cueing, Set up Grooming: Minimal cueing, Setup, Supervision/safety Upper Body Bathing: Minimal cueing, Setup, Supervision/safety Lower Body Bathing: Minimal assistance Upper Body Dressing: Supervision/safety, Setup Lower Body Dressing: Minimal assistance Where Assessed-Lower Body Dressing: Standing at sink Toileting: Minimal assistance Toilet Transfer: Minimal assistance Vision   Perception    Praxis Praxis: Impaired Praxis Impairment Details: Initiation;Perseveration Cognition   Sensation   Motor    Mobility     Trunk/Postural Assessment     Balance   Extremity/Trunk Assessment       Merlene Laughter Denijah Karrer 06/06/2023, 9:19 PM

## 2023-06-06 NOTE — Telephone Encounter (Signed)
Pharmacy Patient Advocate Encounter   Received notification that prior authorization for Methylphenidate HCl 5MG  tablets is required/requested.   Insurance verification completed.   The patient is insured through [facility] .   Per test claim: PA required; PA started via CoverMyMeds. KEY [id number] . Waiting for clinical questions to populate.

## 2023-06-06 NOTE — Progress Notes (Signed)
PROGRESS NOTE   Subjective/Complaints:   No new complaints today.  Patient is disappointed he is not discharging today.  No additional concerns.   ROS:  Pt denies  abd pain, CP, N/V/C/D, and vision changes    Except for HPI Objective:   DG Chest 2 View  Result Date: 06/05/2023 CLINICAL DATA:  Short of breath EXAM: CHEST - 2 VIEW COMPARISON:  02/22/2023 FINDINGS: Frontal and lateral views of the chest demonstrate an unremarkable cardiac silhouette. Postsurgical changes from CABG. No acute airspace disease, effusion, or pneumothorax. No acute bony abnormalities. IMPRESSION: 1. No acute intrathoracic process. Electronically Signed   By: Sharlet Salina M.D.   On: 06/05/2023 20:59   No results for input(s): "WBC", "HGB", "HCT", "PLT" in the last 72 hours.  No results for input(s): "NA", "K", "CL", "CO2", "GLUCOSE", "BUN", "CREATININE", "CALCIUM" in the last 72 hours.   Intake/Output Summary (Last 24 hours) at 06/06/2023 0846 Last data filed at 06/06/2023 0530 Gross per 24 hour  Intake 240 ml  Output 1400 ml  Net -1160 ml         Physical Exam: Vital Signs Blood pressure 109/61, pulse 69, temperature 98.5 F (36.9 C), resp. rate 15, height 5\' 6"  (1.676 m), weight 81.2 kg, SpO2 94%.     General: awake, alert, appropriate, sitting up in bed- NAD HENT: conjugate gaze; oropharynx moist- large bifrontal incision- almost healed CV: regular rate and rhythm; no JVD Pulmonary: CTA B/L; no W/R/R- good air movement GI: soft, NT, ND, (+)BS- hypoactive Psychiatric: appropriate- interactive- flat affect   Neurologic:   Pt is alert and oriented to person, place, time, and event. Follows basic commands, normal language, speech clear.  motor: Antigravity and against resistance in all 4 extremities equally Sensory exam normal for light touch and pain in all 4 limbs  No abnormal tone noted No ankle  clonus   Assessment/Plan: 1. Functional deficits which require 3+ hours per day of interdisciplinary therapy in a comprehensive inpatient rehab setting. Physiatrist is providing close team supervision and 24 hour management of active medical problems listed below. Physiatrist and rehab team continue to assess barriers to discharge/monitor patient progress toward functional and medical goals  Care Tool:  Bathing    Body parts bathed by patient: Right arm, Left arm, Chest, Abdomen, Front perineal area, Right upper leg, Buttocks, Left upper leg, Right lower leg, Left lower leg, Face         Bathing assist Assist Level: Supervision/Verbal cueing     Upper Body Dressing/Undressing Upper body dressing   What is the patient wearing?: Pull over shirt    Upper body assist Assist Level: Independent    Lower Body Dressing/Undressing Lower body dressing      What is the patient wearing?: Pants     Lower body assist Assist for lower body dressing: Supervision/Verbal cueing     Toileting Toileting    Toileting assist Assist for toileting: Supervision/Verbal cueing     Transfers Chair/bed transfer  Transfers assist     Chair/bed transfer assist level: Supervision/Verbal cueing     Locomotion Ambulation   Ambulation assist      Assist level: Supervision/Verbal cueing Assistive device:  No Device Max distance: 150   Walk 10 feet activity   Assist     Assist level: Supervision/Verbal cueing Assistive device: No Device   Walk 50 feet activity   Assist    Assist level: Supervision/Verbal cueing Assistive device: No Device    Walk 150 feet activity   Assist    Assist level: Supervision/Verbal cueing Assistive device: No Device    Walk 10 feet on uneven surface  activity   Assist     Assist level: Minimal Assistance - Patient > 75%     Wheelchair     Assist Is the patient using a wheelchair?: Yes Type of Wheelchair: Manual     Wheelchair assist level: Dependent - Patient 0% Max wheelchair distance: 150'    Wheelchair 50 feet with 2 turns activity    Assist        Assist Level: Dependent - Patient 0%   Wheelchair 150 feet activity     Assist      Assist Level: Dependent - Patient 0%   Blood pressure 109/61, pulse 69, temperature 98.5 F (36.9 C), resp. rate 15, height 5\' 6"  (1.676 m), weight 81.2 kg, SpO2 94%.    Medical Problem List and Plan: 1. 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 Team continues to work on motivation and encouragement.   -7/27 wife asked about results of EEG which was reviewed.  No seizure activity was noted.  EEG with cortical dysfunction bifrontal region and mild diffuse encephalopathy appears consistent with prior history and I do not think this requires acute intervention.  7/29- con't CIR PT, OT and SLP  -7/30 peer-to-peer completed regarding SNF placement.  This was denied  7/31 patient's family planning to appeal SNF decision with insurance 2.  Antithrombotics: -DVT/anticoagulation:  Mechanical: Antiembolism stockings, thigh (TED hose) Bilateral lower extremities             -antiplatelet therapy: ASA 81 mg daily  3. Pain Management: Hydrocodone as needed  -7/27 patient with mild occasional headache, advised trying as needed Tylenol.  Patient to call nursing  -7/28 scalp pain/headache has resolved, continue to monitor  7/29- no HA's for "2 days"- will con't reigmen 4. Mood/Behavior/Sleep:               -behavior improving but still unwilling to  participate in therapies at times.  -observe for increased agitation/confusion. -maximize sleep/wake cycle              -antipsychotic agents: seroquel 25mg  every day prn agitation/sleep             -trazodone 50mg  at bedtime---will schedule tonight.              -sleep chart--sleep has been inconsistent - >7 hours 7/23-24; good  7/29- didn't sleep well overnight- but had been for 3-4 days prior- will con't regimen for now- wont increase trazodone since risk of urinary retention.  5. Neuropsych/cognition: This patient is not capable of making decisions on her own behalf.             -  namenda 5mg  bid per home dosing  -7/23: D/w neurology regarding ? Seizures and need for EEG prior to starting stimulant (likely modafinil vs. Ritalin) d/t possible seizure risk   - wife and patient would benefit from neuropsych consult     - pending on how he does with Keppra wean, add Ritalin in AM 5 mg BID  - 7-25-tolerated Keppra wean, adding Ritalin 5 mg twice daily -7-26 therapy engagement and exam appear much improved in terms of attention and speed of processing; memory remains difficult.  Continue Ritalin at current dose  6. Skin/Wound Care: Routine skin checks    - 7/24: Patient scratching incision sites; educated on need to avoid infection, add benadryl cream PRN  7. Fluids/Electrolytes/Nutrition: pt is dry.Marland KitchenMarland Kitchenpush po fluids -recheck bmet 7/23   8 Seizure prophylaxis. keppra 500 mg BID for 14 days post-op - Wife has reported staring spells post-op; ?absence seizures, however some last a long time (>30 min) and none witnessed inpatient -no similar activity reported while inpatient  - 7/23: D/w neurology, no consult needed, ordered EEG and f/u OP with [facility] for 72 hour study after discharge if no findings - 7/24: EEG no epileptiform discharges, generalized findings in frontal area. D/w wife. DC keppra and monitor.    9.Hypothyroid. Synthroid  10.DM.Semglee 60 unit daily and Glucophage 1000  mg bid  -SSI/Monitor while on decadron  - 7/20: BG low this evening  50s; reduce semglee to 50 U   - 7/21: BG increased this evening; increase semglee back to 55 U; if ongoing low AM and high PM might consider splitting dose  -7/22 CBG's better today with recent changes--follow for pattern   - 7/23: AM hypoglycemia again - moving target while weaning steroids - will go back to 50U semglee and allow highs to avoid lows   - 7/24-25: [facility] better controlled with steroid wean; monitor  -7-26: Semglee back to 40 units daily given low this a.m; will discontinue with stop with steroids on 7-27 and if blood sugars remain elevated resume glipizide 10 mg per home regimen  7/27 will decrease Semglee to 37 units as would like to avoid  Hypoglycemia 7/28 CBG stable continue current regimen 7/29- decreased Semglee to 30 units last evening due to low CBG's- hypoglycemia- will con't to wean since off Decadron -7/30 hypoglycemic this morning, will decrease to 13 units -7/31 CBG stable today, continue current regimen and monitor   Recent Labs    06/05/23 1615 06/05/23 2109 06/06/23 0600  GLUCAP 216* 169* 136*     11.GERD.Protonix 12.Hyperlipidemia.Lipitor 13.HTN.Lopressor 25 mg BID ` -7/28 pressure well-controlled, continue current regimen   7/29- BP controlled- DBP slightly high, but otherwise controlled- con't regimen  -7/31 BP well-controlled, continue to monitor    06/06/2023    5:27 AM 06/05/2023    7:26 PM 06/05/2023    5:26 AM  Vitals with BMI  Systolic 109 126 161  Diastolic 61 73 59  Pulse 69 67 67   14. Steroids for cerebral edema. Resumed decadron taper on admission to hospital 7/15.  - 7/15-17: 2 mg total) by mouth Q6H - 7/18-21: 2 tablets (2 mg total) - 2 (two) times daily with a meal for 3 days - 7/22-24: tablet (1 mg total) 2 (two) times daily with a meal for 3 days - 7/25-27: 1 tablet (1 mg total) daily with breakfast for 3 days.   15: Urinary incontinence/urgency-likely  behavioral component as patient does not notify nursing or staff when he voids -PVRs  today, if no improved continence with starting Ritalin for attention/memory, and if PVRs remain low, may start oxybutynin 5 mg twice daily in a.m. -7-26: PVRs low, patient continent overnight, attributes to improved processing and cognition with Ritalin.  No medication adjustments at this time. 7/27 intermittent incontinence however appears to be improving, PVRs 0, continue current regimen 7/28 continent last 24 hours, improved 7/30 remains continent  16.  Shortness of breath.  Vital stable  -Possibly related to anxiety, will check chest x-ray-no acute process.  Symptoms have improved    LOS: 12 days A FACE TO FACE EVALUATION WAS PERFORMED  Fanny Dance 06/06/2023, 8:46 AM

## 2023-06-06 NOTE — Progress Notes (Signed)
Physical Therapy Discharge Summary  Patient Details  Name: [name] [name] MRN: [id number] Date of Birth: [birth year]  Date of Discharge from PT service:June 06, 2023  {CHL IP REHAB PT TIME CALCULATION:304800500}   Patient has met 8 of 8 long term goals due to improved activity tolerance, improved balance, increased strength, improved attention, improved awareness, and improved coordination.  Patient to discharge at an ambulatory level Supervision.   Patient's care partner is independent to provide the necessary physical and cognitive assistance at discharge.  Reasons goals not met: NA  Recommendation:  Patient will benefit from ongoing skilled PT services in home health setting (due to transportation challenges) to continue to advance safe functional mobility, address ongoing impairments in ***, and minimize fall risk.  Equipment: No equipment provided  Reasons for discharge: treatment goals met and discharge from hospital  Patient/family agrees with progress made and goals achieved: Yes  PT Discharge Precautions/Restrictions Precautions Precautions: Fall Restrictions Weight Bearing Restrictions: No Pain No/Denies pain.  Pain Interference Pain Interference Pain Effect on Sleep: 1. Rarely or not at all Pain Interference with Therapy Activities: 1. Rarely or not at all Pain Interference with Day-to-Day Activities: 1. Rarely or not at all Vision/Perception  Vision - History Ability to See in Adequate Light: 0 Adequate Perception Perception: Within Functional Limits Praxis Praxis: Impaired Praxis Impairment Details: Initiation;Perseveration  Cognition Overall Cognitive Status: Impaired/Different from baseline Arousal/Alertness: Awake/alert Orientation Level: Oriented X4 Year: 2024 Month: July Day of Week: Correct Sustained Attention: Impaired Selective Attention: Impaired Memory: Impaired Awareness: Impaired Problem Solving: Impaired Reasoning: Impaired Behaviors:  Other (comment) (Impaired initiation secondary to behavior) Safety/Judgment: Impaired [employer] Scales of Cognitive Functioning: Automatic, Appropriate Sensation Sensation Light Touch: Appears Intact Coordination Gross Motor Movements are Fluid and Coordinated: No Fine Motor Movements are Fluid and Coordinated: Yes Coordination and Movement Description: B hand tremors (baseline per patient report) with antalgic gait pattern Motor  Motor Motor: Within Functional Limits Motor - Skilled Clinical Observations: generalized weakness  Mobility Bed Mobility Bed Mobility: Rolling Right;Rolling Left;Sit to Supine;Supine to Sit Rolling Right: Independent Rolling Left: Independent Supine to Sit: Independent Sit to Supine: Independent Transfers Transfers: Sit to Stand;Stand to Sit;Stand Pivot Transfers Sit to Stand: Supervision/Verbal cueing Stand to Sit: Supervision/Verbal cueing Stand Pivot Transfers: Supervision/Verbal cueing Stand Pivot Transfer Details: Verbal cues for sequencing Transfer (Assistive device): None Locomotion  Gait Ambulation: Yes Gait Assistance: Supervision/Verbal cueing Gait Distance (Feet): 200 Feet Assistive device: None Gait Assistance Details: Verbal cues for sequencing;Verbal cues for precautions/safety Gait Gait: Yes Gait Pattern: Impaired (Decreased cadence with antalgic gait pattern) Gait velocity: decr Stairs / Additional Locomotion Stairs: Yes Stairs Assistance: Supervision/Verbal cueing Stair Management Technique: Two rails Number of Stairs: 12 Height of Stairs: 6 Ramp: Supervision/Verbal cueing Curb: Supervision/Verbal cueing Pick up small object from the floor assist level: Supervision/Verbal cueing Wheelchair Mobility Wheelchair Mobility: No  Trunk/Postural Assessment  Cervical Assessment Cervical Assessment: Within Functional Limits Thoracic Assessment Thoracic Assessment: Within Functional Limits Lumbar Assessment Lumbar  Assessment: Exceptions to [facility] (Posterior pelvic tilt) Postural Control Postural Control: Deficits on evaluation Righting Reactions: Delayed, however improved since initial evaluation  Balance Balance Balance Assessed: Yes Standardized Balance Assessment Standardized Balance Assessment: Berg Balance Test Berg Balance Test Sit to Stand: Able to stand without using hands and stabilize independently Standing Unsupported: Able to stand safely 2 minutes Sitting with Back Unsupported but Feet Supported on Floor or Stool: Able to sit safely and securely 2 minutes Stand to Sit: Sits safely with minimal use of  hands Transfers: Able to transfer safely, minor use of hands Standing Unsupported with Eyes Closed: Able to stand 10 seconds safely Standing Ubsupported with Feet Together: Able to place feet together independently and stand 1 minute safely From Standing, Reach Forward with Outstretched Arm: Can reach confidently >25 cm (10") From Standing Position, Pick up Object from Floor: Able to pick up shoe safely and easily From Standing Position, Turn to Look Behind Over each Shoulder: Looks behind from both sides and weight shifts well Turn 360 Degrees: Able to turn 360 degrees safely but slowly Standing Unsupported, Alternately Place Feet on Step/Stool: Able to complete 4 steps without aid or supervision Standing Unsupported, One Foot in Front: Able to place foot tandem independently and hold 30 seconds Standing on One Leg: Able to lift leg independently and hold equal to or more than 3 seconds Total Score: 50 Static Sitting Balance Static Sitting - Balance Support: Feet supported Static Sitting - Level of Assistance: 6: Modified independent (Device/Increase time) Dynamic Sitting Balance Dynamic Sitting - Balance Support: Feet supported;No upper extremity supported;During functional activity Dynamic Sitting - Level of Assistance: 5: Stand by assistance Static Standing Balance Static Standing -  Balance Support: No upper extremity supported Static Standing - Level of Assistance: 5: Stand by assistance Dynamic Standing Balance Dynamic Standing - Balance Support: No upper extremity supported;During functional activity Dynamic Standing - Level of Assistance: 5: Stand by assistance Extremity Assessment      RLE Assessment RLE Assessment: Within Functional Limits LLE Assessment LLE Assessment: Within Functional Limits   Ransome Helwig 06/06/2023, 2:57 PM

## 2023-06-06 NOTE — Plan of Care (Signed)
  Problem: Consults Goal: RH BRAIN INJURY PATIENT EDUCATION Description: Description: See Patient Education module for eduction specifics Outcome: Progressing   Problem: RH BOWEL ELIMINATION Goal: RH STG MANAGE BOWEL WITH ASSISTANCE Description: STG Manage Bowel with mod I/toileting Assistance. Outcome: Progressing Goal: RH STG MANAGE BOWEL W/MEDICATION W/ASSISTANCE Description: STG Manage Bowel with Medication with mod I  Assistance. Outcome: Progressing   Problem: RH BLADDER ELIMINATION Goal: RH STG MANAGE BLADDER WITH ASSISTANCE Description: STG Manage Bladder With toileting Assistance Outcome: Progressing

## 2023-06-06 NOTE — Progress Notes (Signed)
Physical Therapy Session Note  Patient Details  Name: [name] [name] MRN: [id number] Date of Birth: 1946-05-26  Today's Date: 06/06/2023 PT Individual Time: 1345-1500 PT Individual Time Calculation (min): 75 min   Short Term Goals: Week 2:  PT Short Term Goal 1 (Week 2): STGs=LTGs secondary to ELOS  Skilled Therapeutic Interventions/Progress Updates:  Patient greeted supine in bed with spouse present and eventually agreeable to PT treatment session, however notably frustrated regarding discharge disposition and not being able to leave today. Therapist provided active listening and education regarding discharge- Wife was notified during treatment session that patient would be able to leave tomorrow morning, however patient stated he would not believe this until he's walking out the door. Patient required increased time with multimodal cues and coaxing to transition to sitting EOB- Patient is able to perform all bed mobility independently, however it is on his terms and when he wants to. While sitting EOB, patient was able to dress himself with set-up assistance. Patient ambulated throughout the fourth floor without the use of an AD and supv. Patient ascended/descended x12 steps with BHR and supv. Patient then completed the Berg Balance Assessment- Patient demonstrates increased fall risk as noted by score of 50/56 on Berg Balance Scale.  (<36= high risk for falls, close to 100%; 37-45 significant >80%; 46-51 moderate >50%; 52-55 lower >25%). Patient returned to his room and left sitting upright in bedside recliner with posey belt on, call bell within reach, spouse present and all needs met.    Therapy Documentation Precautions:  Precautions Precautions: Fall Restrictions Weight Bearing Restrictions: No  Pain: No/Denies pain.    Therapy/Group: Individual Therapy  [name] 06/06/2023, 8:58 AM

## 2023-06-07 ENCOUNTER — Other Ambulatory Visit (HOSPITAL_COMMUNITY): Payer: Self-pay

## 2023-06-07 LAB — GLUCOSE, CAPILLARY: Glucose-Capillary: 155 mg/dL — ABNORMAL HIGH (ref 70–99)

## 2023-06-07 NOTE — Progress Notes (Signed)
Inpatient Rehabilitation Care Coordinator Discharge Note   Patient Details  Name: [name] [name] MRN: [id number] Date of Birth: [birth year]   Discharge location: D/c to home  Length of Stay: 12 days  Discharge activity level: Supervision  Home/community participation: Limited  Patient response FB:PZWCHE Literacy - How often do you need to have someone help you when you read instructions, pamphlets, or other written material from your doctor or pharmacy?: Never  Patient response NI:DPOEUM Isolation - How often do you feel lonely or isolated from those around you?: Sometimes  Services provided included: MD, RD, PT, OT, SLP, RN, CM, TR, Pharmacy, SW, Neuropsych  Financial Services:  Field seismologist Utilized: Media planner [employer]  Choices offered to/list presented to: patient family  Follow-up services arranged:  Home Health Home Health Agency: [facility] for HHPT/OT/SLP/SW         Patient response to transportation need: Is the patient able to respond to transportation needs?: Yes In the past 12 months, has lack of transportation kept you from medical appointments or from getting medications?: No In the past 12 months, has lack of transportation kept you from meetings, work, or from getting things needed for daily living?: No   Patient/Family verbalized understanding of follow-up arrangements:  Yes  Individual responsible for coordination of the follow-up plan: contact pt wife [name] [name] [phone]  Confirmed correct DME delivered: Gretchen Short 06/07/2023    Comments (or additional information):fam edu completed  Summary of Stay    Date/Time Discharge Planning CSW  06/04/23 1212 SNF pending insurance approval. If not, D/c plan will be to d/c to home with wife who is primary caregiver. Wife is not able to provide any physical assistance due to physical ailments. SW will confirm there are no barriers to discharge. AAC  05/28/23 1302 Pt will d/c to home  with wife who is primary caregiver. Wife is not able to provide any physical assistance due to physical ailments. SW will confirm there are no barriers to discharge. AAC       [name] [name] [name] [name]

## 2023-06-07 NOTE — Progress Notes (Signed)
PROGRESS NOTE   Subjective/Complaints:   No new complaints or concerns.  He is looking forward to going home today.   ROS:  Pt denies headache, abd pain, CP, N/V/C/D, and vision changes    Except for HPI Objective:   DG Chest 2 View  Result Date: 06/05/2023 CLINICAL DATA:  Short of breath EXAM: CHEST - 2 VIEW COMPARISON:  02/22/2023 FINDINGS: Frontal and lateral views of the chest demonstrate an unremarkable cardiac silhouette. Postsurgical changes from CABG. No acute airspace disease, effusion, or pneumothorax. No acute bony abnormalities. IMPRESSION: 1. No acute intrathoracic process. Electronically Signed   By: Sharlet Salina M.D.   On: 06/05/2023 20:59   No results for input(s): "WBC", "HGB", "HCT", "PLT" in the last 72 hours.  No results for input(s): "NA", "K", "CL", "CO2", "GLUCOSE", "BUN", "CREATININE", "CALCIUM" in the last 72 hours.   Intake/Output Summary (Last 24 hours) at 06/07/2023 1017 Last data filed at 06/07/2023 0754 Gross per 24 hour  Intake 837 ml  Output 950 ml  Net -113 ml         Physical Exam: Vital Signs Blood pressure 129/76, pulse 72, temperature (!) 97.5 F (36.4 C), resp. rate 18, height 5\' 6"  (1.676 m), weight 81.2 kg, SpO2 97%.     General: awake, alert, appropriate, sitting up in bed- NAD HENT: conjugate gaze; oropharynx moist- large bifrontal incision- almost healed, no signs of infection CV: regular rate and rhythm; no JVD Pulmonary: CTA B/L; no W/R/R- good air movement GI: soft, NT, ND, (+)BS- hypoactive Psychiatric: appropriate- interactive- flat affect  Neurologic:   Pt is alert and oriented to person, place, time, and event. Follows basic commands, normal language, speech clear.  motor: Antigravity and against resistance in all 4 extremities equally Sensory exam normal for light touch and pain in all 4 limbs  No abnormal tone noted No ankle clonus Slight area of  altered sensation around incision on his head   Assessment/Plan: 1. Functional deficits which require 3+ hours per day of interdisciplinary therapy in a comprehensive inpatient rehab setting. Physiatrist is providing close team supervision and 24 hour management of active medical problems listed below. Physiatrist and rehab team continue to assess barriers to discharge/monitor patient progress toward functional and medical goals  Care Tool:  Bathing    Body parts bathed by patient: Right arm, Left arm, Chest, Abdomen, Front perineal area, Right upper leg, Buttocks, Left upper leg, Right lower leg, Left lower leg, Face         Bathing assist Assist Level: Supervision/Verbal cueing     Upper Body Dressing/Undressing Upper body dressing   What is the patient wearing?: Pull over shirt    Upper body assist Assist Level: Independent    Lower Body Dressing/Undressing Lower body dressing      What is the patient wearing?: Pants, Underwear/pull up     Lower body assist Assist for lower body dressing: Supervision/Verbal cueing     Toileting Toileting Toileting Activity did not occur (Clothing management and hygiene only): N/A (no void or bm)  Toileting assist Assist for toileting: Supervision/Verbal cueing     Transfers Chair/bed transfer  Transfers assist     Chair/bed  transfer assist level: Supervision/Verbal cueing     Locomotion Ambulation   Ambulation assist      Assist level: Supervision/Verbal cueing Assistive device: No Device Max distance: 200   Walk 10 feet activity   Assist     Assist level: Supervision/Verbal cueing Assistive device: No Device   Walk 50 feet activity   Assist    Assist level: Supervision/Verbal cueing Assistive device: No Device    Walk 150 feet activity   Assist    Assist level: Supervision/Verbal cueing Assistive device: No Device    Walk 10 feet on uneven surface  activity   Assist     Assist level:  Supervision/Verbal cueing Assistive device: Other (comment) (No AD)   Wheelchair     Assist Is the patient using a wheelchair?: No Type of Wheelchair: Manual    Wheelchair assist level: Dependent - Patient 0% Max wheelchair distance: 150'    Wheelchair 50 feet with 2 turns activity    Assist        Assist Level: Dependent - Patient 0%   Wheelchair 150 feet activity     Assist      Assist Level: Dependent - Patient 0%   Blood pressure 129/76, pulse 72, temperature (!) 97.5 F (36.4 C), resp. rate 18, height 5\' 6"  (1.676 m), weight 81.2 kg, SpO2 97%.    Medical Problem List and Plan: 1. 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 Team continues to work on motivation and encouragement.   -7/27 wife asked about results of EEG which was reviewed.  No seizure activity was noted.  EEG with cortical dysfunction bifrontal region and mild diffuse encephalopathy appears consistent with prior history and I do not think this requires acute intervention.  7/29- con't CIR PT, OT and SLP  -7/30 peer-to-peer completed regarding SNF placement.  This was denied  7/31 patient's family planning to appeal SNF decision with insurance  -8/1 stable for DC home today  -Surgical incision appears to be healing well discussed that do not think slight numbness around incision is sign of concern, this is not painful 2.  Antithrombotics: -DVT/anticoagulation:  Mechanical: Antiembolism stockings, thigh (TED hose) Bilateral lower extremities             -antiplatelet therapy: ASA 81 mg  daily  3. Pain Management: Hydrocodone as needed  -7/27 patient with mild occasional headache, advised trying as needed Tylenol.  Patient to call nursing  -7/28 scalp pain/headache has resolved, continue to monitor  7/29- no HA's for "2 days"- will con't reigmen 4. Mood/Behavior/Sleep:               -behavior improving but still unwilling to participate in therapies at times.  -observe for increased agitation/confusion. -maximize sleep/wake cycle              -antipsychotic agents: seroquel 25mg  every day prn agitation/sleep             -trazodone 50mg  at bedtime---will schedule tonight.              -  sleep chart--sleep has been inconsistent - >7 hours 7/23-24; good  7/29- didn't sleep well overnight- but had been for 3-4 days prior- will con't regimen for now- wont increase trazodone since risk of urinary retention.  5. Neuropsych/cognition: This patient is not capable of making decisions on her own behalf.             -namenda 5mg  bid per home dosing  -7/23: D/w neurology regarding ? Seizures and need for EEG prior to starting stimulant (likely modafinil vs. Ritalin) d/t possible seizure risk   - wife and patient would benefit from neuropsych consult     - pending on how he does with Keppra wean, add Ritalin in AM 5 mg BID  - 7-25-tolerated Keppra wean, adding Ritalin 5 mg twice daily -7-26 therapy engagement and exam appear much improved in terms of attention and speed of processing; memory remains difficult.  Continue Ritalin at current dose  6. Skin/Wound Care: Routine skin checks    - 7/24: Patient scratching incision sites; educated on need to avoid infection, add benadryl cream PRN  7. Fluids/Electrolytes/Nutrition: pt is dry.Marland KitchenMarland Kitchenpush po fluids -recheck bmet 7/23   8 Seizure prophylaxis. keppra 500 mg BID for 14 days post-op - Wife has reported staring spells post-op; ?absence seizures, however some last a long time (>30 min) and none witnessed inpatient -no similar activity  reported while inpatient  - 7/23: D/w neurology, no consult needed, ordered EEG and f/u OP with [facility] for 72 hour study after discharge if no findings - 7/24: EEG no epileptiform discharges, generalized findings in frontal area. D/w wife. DC keppra and monitor.    9.Hypothyroid. Synthroid  10.DM.Semglee 60 unit daily and Glucophage 1000 mg bid  -SSI/Monitor while on decadron  - 7/20: BG low this evening  50s; reduce semglee to 50 U   - 7/21: BG increased this evening; increase semglee back to 55 U; if ongoing low AM and high PM might consider splitting dose  -7/22 CBG's better today with recent changes--follow for pattern   - 7/23: AM hypoglycemia again - moving target while weaning steroids - will go back to 50U semglee and allow highs to avoid lows   - 7/24-25: [facility] better controlled with steroid wean; monitor  -7-26: Semglee back to 40 units daily given low this a.m; will discontinue with stop with steroids on 7-27 and if blood sugars remain elevated resume glipizide 10 mg per home regimen  7/27 will decrease Semglee to 37 units as would like to avoid  Hypoglycemia 7/28 CBG stable continue current regimen 7/29- decreased Semglee to 30 units last evening due to low CBG's- hypoglycemia- will con't to wean since off Decadron -7/30 hypoglycemic this morning, will decrease to 13 units -7/31 CBG stable today, continue current regimen and monitor -8/1 CBGs appear stable, follow-up with PCP for further monitoring  Recent Labs    06/06/23 1640 06/06/23 2033 06/07/23 0622  GLUCAP 229* 115* 155*     11.GERD.Protonix 12.Hyperlipidemia.Lipitor 13.HTN.Lopressor 25 mg BID ` -7/28 pressure well-controlled, continue current regimen   7/29- BP controlled- DBP slightly high, but otherwise controlled- con't regimen  -8/1 BP well-controlled    06/07/2023    5:35 AM 06/06/2023    7:56 PM 06/06/2023    1:29 PM  Vitals with BMI  Systolic 129 131 440  Diastolic 76 73 76  Pulse 72 79 81    14. Steroids for cerebral edema. Resumed decadron taper on admission to hospital 7/15.  - 7/15-17: 2 mg total)  by mouth Q6H - 7/18-21: 2 tablets (2 mg total) - 2 (two) times daily with a meal for 3 days - 7/22-24: tablet (1 mg total) 2 (two) times daily with a meal for 3 days - 7/25-27: 1 tablet (1 mg total) daily with breakfast for 3 days.   15: Urinary incontinence/urgency-likely behavioral component as patient does not notify nursing or staff when he voids -PVRs today, if no improved continence with starting Ritalin for attention/memory, and if PVRs remain low, may start oxybutynin 5 mg twice daily in a.m. -7-26: PVRs low, patient continent overnight, attributes to improved processing and cognition with Ritalin.  No medication adjustments at this time. 7/27 intermittent incontinence however appears to be improving, PVRs 0, continue current regimen 7/28 continent last 24 hours, improved 8/1 improved and patient remains continent  16.  Shortness of breath.  Vital stable  -Possibly related to anxiety, will check chest x-ray-no acute process.  Symptoms have improved  -8/1 resolved, follow-up with PCP for further monitoring    LOS: 13 days A FACE TO FACE EVALUATION WAS PERFORMED  Fanny Dance 06/07/2023, 10:17 AM

## 2023-06-07 NOTE — Telephone Encounter (Signed)
Pharmacy Patient Advocate Encounter  Received notification from [facility] that Prior Authorization for Methylphenidate HCl 5MG  tablets  has been APPROVED from 06/07/2023 to 06/06/2024. Ran test claim, Copay is $27.44  PA #/Case ID/Reference #: [id number]

## 2023-06-07 NOTE — Progress Notes (Signed)
Speech Language Pathology Discharge Summary  Patient Details  Name: [name] [name] MRN: [id number] Date of Birth: [birth year]  Date of Discharge from SLP service:June 07, 2023  Patient has met 2 of 3 long term goals.  Patient to discharge at [facility] level.  Reasons goals not met:   pt participation/self limiting behaviors negatively impacted ability to meet awareness goal.   Clinical Impression/Discharge Summary:   Pt presented w/ fair success during this admission. He demonstrated improved thought formulation, word finding, attention, and functional problem solving throughout stay. However, given baseline cognitive deficits c/w dementia, mild cognitive-linguistic deficits remain. Pt/family education completed, including external memory strategies to utilize in the home environment. He would benefit from continued ST upon d/c to maximize pt independence and reduce caregiver burden.   Care Partner:  Caregiver Able to Provide Assistance: Yes  Type of Caregiver Assistance: Cognitive;Physical  Recommendation:  Skilled Nursing facility  Rationale for SLP Follow Up: Maximize cognitive function and independence;Reduce caregiver burden   Equipment:   n/a  Reasons for discharge: Discharged from hospital    [name] 06/07/2023, 12:08 PM

## 2023-06-07 NOTE — Plan of Care (Signed)
  Problem: RH Awareness Goal: LTG: Patient will demonstrate awareness during functional activites type of (SLP) Description: LTG: Patient will demonstrate awareness during functional activites type of (SLP) Outcome: Not Met (add Reason)   Problem: RH Memory Goal: LTG Patient will use memory compensatory aids to (SLP) Description: LTG:  Patient will use memory compensatory aids to recall biographical/new, daily complex information with cues (SLP) Outcome: Completed/Met   Problem: RH Attention Goal: LTG Patient will demonstrate this level of attention during functional activites (SLP) Description: LTG:  Patient will will demonstrate this level of attention during functional activites (SLP) Outcome: Completed/Met

## 2023-06-07 NOTE — Plan of Care (Signed)
  Problem: RH Balance Goal: LTG Patient will maintain dynamic standing with ADLs (OT) Description: LTG:  Patient will maintain dynamic standing balance with assist during activities of daily living (OT)  Outcome: Completed/Met   Problem: Sit to Stand Goal: LTG:  Patient will perform sit to stand in prep for activites of daily living with assistance level (OT) Description: LTG:  Patient will perform sit to stand in prep for activites of daily living with assistance level (OT) Outcome: Completed/Met   Problem: RH Eating Goal: LTG Patient will perform eating w/assist, cues/equip (OT) Description: LTG: Patient will perform eating with assist, with/without cues using equipment (OT) Outcome: Completed/Met   Problem: RH Grooming Goal: LTG Patient will perform grooming w/assist,cues/equip (OT) Description: LTG: Patient will perform grooming with assist, with/without cues using equipment (OT) Outcome: Completed/Met   Problem: RH Bathing Goal: LTG Patient will bathe all body parts with assist levels (OT) Description: LTG: Patient will bathe all body parts with assist levels (OT) Outcome: Completed/Met   Problem: RH Dressing Goal: LTG Patient will perform upper body dressing (OT) Description: LTG Patient will perform upper body dressing with assist, with/without cues (OT). Outcome: Completed/Met Goal: LTG Patient will perform lower body dressing w/assist (OT) Description: LTG: Patient will perform lower body dressing with assist, with/without cues in positioning using equipment (OT) Outcome: Completed/Met   Problem: RH Toileting Goal: LTG Patient will perform toileting task (3/3 steps) with assistance level (OT) Description: LTG: Patient will perform toileting task (3/3 steps) with assistance level (OT)  Outcome: Completed/Met   Problem: RH Toilet Transfers Goal: LTG Patient will perform toilet transfers w/assist (OT) Description: LTG: Patient will perform toilet transfers with assist,  with/without cues using equipment (OT) Outcome: Completed/Met   Problem: RH Awareness Goal: LTG: Patient will demonstrate awareness during functional activites type of (OT) Description: LTG: Patient will demonstrate awareness during functional activites type of (OT) Outcome: Completed/Met

## 2023-06-07 NOTE — Telephone Encounter (Signed)
Clinical Questions have been submitted

## 2023-06-07 NOTE — Plan of Care (Signed)
  Problem: RH BOWEL ELIMINATION Goal: RH STG MANAGE BOWEL WITH ASSISTANCE Description: STG Manage Bowel with mod I/toileting Assistance. Outcome: Progressing Goal: RH STG MANAGE BOWEL W/MEDICATION W/ASSISTANCE Description: STG Manage Bowel with Medication with mod I  Assistance. Outcome: Progressing   Problem: RH BLADDER ELIMINATION Goal: RH STG MANAGE BLADDER WITH ASSISTANCE Description: STG Manage Bladder With toileting Assistance Outcome: Progressing   Problem: RH SAFETY Goal: RH STG ADHERE TO SAFETY PRECAUTIONS W/ASSISTANCE/DEVICE Description: STG Adhere to Safety Precautions With cues Assistance/Device. Outcome: Progressing   Problem: RH COGNITION-NURSING Goal: RH STG USES MEMORY AIDS/STRATEGIES W/ASSIST TO PROBLEM SOLVE Description: STG Uses Memory Aids/Strategies With cues Assistance to Problem Solve. Outcome: Progressing

## 2023-06-11 ENCOUNTER — Telehealth: Payer: Self-pay | Admitting: *Deleted

## 2023-06-11 NOTE — Telephone Encounter (Signed)
Transitional Care call--[name] [name]    Are you/is patient experiencing any problems since coming home? Are there any questions regarding any aspect of care? NO He did have one incident on Saturday when HR went up to 144 and BS dropped to 1. He did not want to go to ED. Wife gave NTG and something for BS and it came back up and HR went down. No further incidents. Are there any questions regarding medications administration/dosing? Are meds being taken as prescribed? Patient should review meds with caller to confirm Has everything but multivitamin they need to get Have there been any falls? NO Has Home Health been to the house and/or have they contacted you? If not, have you tried to contact them? Can we help you contact them? Yes they have made contact Are bowels and bladder emptying properly? Are there any unexpected incontinence issues? If applicable, is patient following bowel/bladder programs? NO Any fevers, problems with breathing, unexpected pain? NO Are there any skin problems or new areas of breakdown? NO Has the patient/family member arranged specialty MD follow up (ie cardiology/neurology/renal/surgical/etc)?  Can we help arrange? Appt given for [name] NP, they have not made the appt with [name] yet. Does the patient need any other services or support that we can help arrange? NO Are caregivers following through as expected in assisting the patient? Yes Has the patient quit smoking, drinking alcohol, or using drugs as recommended? Yes  Appointment 06/19/23 @1 :20 arrive by 1:00 to see [name] NP then back to [place] after that. Address reviewed and informed of packet mailed to them. [id number]
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
## 2023-06-19 ENCOUNTER — Encounter: Payer: Medicare Other | Attending: Registered Nurse | Admitting: Registered Nurse

## 2023-06-19 ENCOUNTER — Encounter: Payer: Self-pay | Admitting: Registered Nurse

## 2023-06-19 VITALS — BP 134/84 | HR 68 | Ht 66.0 in | Wt 184.0 lb

## 2023-06-19 DIAGNOSIS — E7849 Other hyperlipidemia: Secondary | ICD-10-CM | POA: Diagnosis not present

## 2023-06-19 DIAGNOSIS — I10 Essential (primary) hypertension: Secondary | ICD-10-CM | POA: Insufficient documentation

## 2023-06-19 DIAGNOSIS — D329 Benign neoplasm of meninges, unspecified: Secondary | ICD-10-CM | POA: Insufficient documentation

## 2023-06-19 DIAGNOSIS — G4709 Other insomnia: Secondary | ICD-10-CM | POA: Diagnosis not present

## 2023-06-19 MED ORDER — TRAZODONE HCL 50 MG PO TABS: 50.0000 mg | ORAL_TABLET | Freq: Every day | ORAL | 1 refills | Status: DC

## 2023-06-19 MED ORDER — METHYLPHENIDATE HCL 5 MG PO TABS: 5.0000 mg | ORAL_TABLET | Freq: Two times a day (BID) | ORAL | 0 refills | Status: DC

## 2023-06-19 NOTE — Progress Notes (Unsigned)
Subjective:    Patient ID: [name] [name], male    DOB: 1946/08/04, 77 y.o.   MRN: [id number]  HPI: [name] [name] is a 77 y.o. male who returns for follow up appointment for chronic pain and medication refill. states *** pain is located in  ***. rates pain ***. current exercise regime is walking and performing stretching exercises.     Pain Inventory Average Pain 0 Pain Right Now 0 My pain is  numbness across crown of head  LOCATION OF PAIN  No pain only numbness across the crown of the head  BOWEL Number of stools per week: 4 Oral laxative use Yes    BLADDER Pads  Bladder incontinence Yes     Mobility walk without assistance how many minutes can you walk? 10 ability to climb steps?  yes do you drive?  no Do you have any goals in this area?  yes  Function retired I need assistance with the following:  household duties Do you have any goals in this area?  yes  Neuro/Psych bladder control problems weakness numbness trouble walking confusion depression loss of taste or smell  Prior Studies Any changes since last visit?  no  Physicians involved in your care Any changes since last visit?  no   Family History  Problem Relation Age of Onset   Heart attack Father 41   Diabetes Father    Diabetes Mother    Heart attack Paternal Grandfather 73   Social History   Socioeconomic History   Marital status: Married    Spouse name: Not on file   Number of children: 4   Years of education: Not on file   Highest education level: Not on file  Occupational History   Occupation: Retired Naval architect  Tobacco Use   Smoking status: Former    Current packs/day: 0.00    Average packs/day: 3.0 packs/day for 35.0 years (105.0 ttl pk-yrs)    Types: Cigarettes    Start date: 09/16/1965    Quit date: 09/16/2000    Years since quitting: 22.7   Smokeless tobacco: Never  Vaping Use   Vaping status: Never Used  Substance and Sexual Activity   Alcohol use: No   Drug  use: No   Sexual activity: Not Currently  Other Topics Concern   Not on file  Social History Narrative   Left handed    Lives in a one story home    Social Determinants of Health   Financial Resource Strain: Not on file  Food Insecurity: No Food Insecurity (05/25/2023)   Hunger Vital Sign    Worried About Running Out of Food in the Last Year: Never true    Ran Out of Food in the Last Year: Never true  Transportation Needs: No Transportation Needs (05/25/2023)   PRAPARE - Administrator, Civil Service (Medical): No    Lack of Transportation (Non-Medical): No  Physical Activity: Not on file  Stress: Not on file  Social Connections: Unknown (03/20/2022)   Received from [facility], [facility]   Social Network    Social Network: Not on file   Past Surgical History:  Procedure Laterality Date   ANTERIOR CERVICAL DISCECTOMY  03/20/2001   Many levels.  Dr Franky Macho   APPLICATION OF CRANIAL NAVIGATION N/A 05/08/2023   Procedure: APPLICATION OF CRANIAL NAVIGATION;  Surgeon: Dawley, Alan Mulder, DO;  Location: [facility];  Service: Neurosurgery;  Laterality: N/A;   Bone spur removal right shoulder  CHOLECYSTECTOMY     CORONARY ARTERY BYPASS GRAFT  10/23/2000   x3 Dr Tyrone Sage   CRANIOTOMY N/A 05/08/2023   Procedure: BIFRONTAL CRANIOTOMY TUMOR EXCISION;  Surgeon: Bethann Goo, DO;  Location: [facility];  Service: Neurosurgery;  Laterality: N/A;   FACIAL FRACTURE SURGERY     after being hit with baseball   MOUTH SURGERY     Pineal cyst removal     PROCTOSCOPY N/A 09/19/2018   Procedure: RIGID PROCTOSCOPY;  Surgeon: Karie Soda, MD;  Location: [facility];  Service: General;  Laterality: N/A;   V-TACH ABLATION N/A 02/15/2015   PVC ablation by Dr Ladona Ridgel   Past Medical History:  Diagnosis Date   Bladder infection    10th grade   CAD (coronary artery disease)    s/p 3V CABG 2001   Cervical spondylosis 2002   History of   Colon cancer (HCC)    Diabetes mellitus (HCC)    Dysphagia     GERD (gastroesophageal reflux disease)    Heart murmur    pt has had echo   HTN (hypertension)    Hyperlipidemia    Hypothyroidism    LVH (left ventricular hypertrophy) 2015   Mild   Myocardial infarction (HCC)    x2   PVC (premature ventricular contraction)    a. s/p ablation   Tobacco abuse, in remission    BP 134/84   Pulse 68   Ht 5\' 6"  (1.676 m)   Wt 184 lb (83.5 kg)   SpO2 95%   BMI 29.70 kg/m   Opioid Risk Score:   Fall Risk Score:  `1  Depression screen PHQ 2/9      No data to display          Review of Systems  Genitourinary:        Bladder control   Musculoskeletal:  Positive for gait problem.  Neurological:  Positive for weakness and numbness.  Psychiatric/Behavioral:  Positive for confusion.        Depression  All other systems reviewed and are negative.     Objective:   Physical Exam        Assessment & Plan:

## 2023-06-21 ENCOUNTER — Ambulatory Visit: Payer: Medicare Other | Admitting: Cardiovascular Disease

## 2023-08-08 ENCOUNTER — Encounter: Payer: Medicare Other | Attending: Registered Nurse | Admitting: Physical Medicine and Rehabilitation

## 2023-08-08 VITALS — BP 119/72 | HR 52 | Ht 66.0 in | Wt 184.2 lb

## 2023-08-08 DIAGNOSIS — G4709 Other insomnia: Secondary | ICD-10-CM | POA: Insufficient documentation

## 2023-08-08 DIAGNOSIS — M546 Pain in thoracic spine: Secondary | ICD-10-CM

## 2023-08-08 DIAGNOSIS — R000 Tachycardia, unspecified: Secondary | ICD-10-CM | POA: Diagnosis not present

## 2023-08-08 DIAGNOSIS — D329 Benign neoplasm of meninges, unspecified: Secondary | ICD-10-CM | POA: Diagnosis not present

## 2023-08-08 DIAGNOSIS — R4189 Other symptoms and signs involving cognitive functions and awareness: Secondary | ICD-10-CM | POA: Diagnosis present

## 2023-08-08 MED ORDER — HYDROCODONE-ACETAMINOPHEN 5-325 MG PO TABS: 1.0000 | ORAL_TABLET | ORAL | 0 refills | Status: DC

## 2023-08-08 MED ORDER — HYDROCODONE-ACETAMINOPHEN 5-325 MG PO TABS: 1.0000 | ORAL_TABLET | Freq: Every day | ORAL | 0 refills | Status: DC | PRN

## 2023-08-08 NOTE — Patient Instructions (Addendum)
Due to the incidence of tachycardia that she described, ongoing to be weaning you off of Ritalin, and recommend that you talk to your cardiologist.  I will always so be sending today's appointment note to them.  Take 1 tablet in the morning for 3 to 4 days, then stop entirely.  Follow-up with Dr. Jake Samples, and talk to him a little bit about the cognitive deficits you are having.  They are in expected part of your injury, although you may benefit from neurocognitive testing or other medications if Ritalin is not able to be tolerated.  We are going to start weaning her hydrocodone.  Your medication is refilled for 1 month at 1 tablet daily, then a second month at 1 tablet every other day, then I will stop prescribing.  I will be messaging your cardiologist about using a muscle relaxant such as methocarbamol to help.  Continue with therapies, and doing cognitively stimulating activities, getting outside as tolerated.  Follow-up with me in 1 months.  I am getting an x-ray of your back for ongoing back pain post fall.  I will let you know when we get results back

## 2023-08-08 NOTE — Progress Notes (Deleted)
Subjective:    Patient ID: [name] [name], male    DOB: 08-04-1946, 77 y.o.   MRN: [id number]  HPI Pain Inventory Average Pain {NUMBERS; 0-10:5044} Pain Right Now {NUMBERS; 0-10:5044} My pain is {PAIN DESCRIPTION:21022940}  LOCATION OF PAIN  ***  BOWEL Number of stools per week: *** Oral laxative use {YES/NO:21197} Type of laxative *** Enema or suppository use {YES/NO:21197} History of colostomy {YES/NO:21197} Incontinent {YES/NO:21197}  BLADDER {bladder options:24190} In and out cath, frequency *** Able to self cath {YES/NO:21197} Bladder incontinence {YES/NO:21197} Frequent urination {YES/NO:21197} Leakage with coughing {YES/NO:21197} Difficulty starting stream {YES/NO:21197} Incomplete bladder emptying {YES/NO:21197}   Mobility {MOBILITY EPP:29518841}  Function {FUNCTION:21022946}  Neuro/Psych {NEURO/PSYCH:21022948}  Prior Studies {CPRM PRIOR STUDIES:21022953}  Physicians involved in your care {CPRM PHYSICIANS INVOLVED IN YOUR CARE:21022954}   Family History  Problem Relation Age of Onset   Heart attack Father 14   Diabetes Father    Diabetes Mother    Heart attack Paternal Grandfather 78   Social History   Socioeconomic History   Marital status: Married    Spouse name: Not on file   Number of children: 4   Years of education: Not on file   Highest education level: Not on file  Occupational History   Occupation: Retired Naval architect  Tobacco Use   Smoking status: Former    Current packs/day: 0.00    Average packs/day: 3.0 packs/day for 35.0 years (105.0 ttl pk-yrs)    Types: Cigarettes    Start date: 09/16/1965    Quit date: 09/16/2000    Years since quitting: 22.9   Smokeless tobacco: Never  Vaping Use   Vaping status: Never Used  Substance and Sexual Activity   Alcohol use: No   Drug use: No   Sexual activity: Not Currently  Other Topics Concern   Not on file  Social History Narrative   Left handed    Lives in a one story home     Social Determinants of Health   Financial Resource Strain: Not on file  Food Insecurity: No Food Insecurity (05/25/2023)   Hunger Vital Sign    Worried About Running Out of Food in the Last Year: Never true    Ran Out of Food in the Last Year: Never true  Transportation Needs: No Transportation Needs (05/25/2023)   PRAPARE - Administrator, Civil Service (Medical): No    Lack of Transportation (Non-Medical): No  Physical Activity: Not on file  Stress: Not on file  Social Connections: Unknown (03/20/2022)   Received from [facility]   Social Network    Social Network: Not on file   Past Surgical History:  Procedure Laterality Date   ANTERIOR CERVICAL DISCECTOMY  03/20/2001   Many levels.  Dr Franky Macho   APPLICATION OF CRANIAL NAVIGATION N/A 05/08/2023   Procedure: APPLICATION OF CRANIAL NAVIGATION;  Surgeon: Dawley, Alan Mulder, DO;  Location: [facility];  Service: Neurosurgery;  Laterality: N/A;   Bone spur removal right shoulder     CHOLECYSTECTOMY     CORONARY ARTERY BYPASS GRAFT  10/23/2000   x3 Dr Tyrone Sage   CRANIOTOMY N/A 05/08/2023   Procedure: BIFRONTAL CRANIOTOMY TUMOR EXCISION;  Surgeon: Bethann Goo, DO;  Location: [facility];  Service: Neurosurgery;  Laterality: N/A;   FACIAL FRACTURE SURGERY     after being hit with baseball   MOUTH SURGERY     Pineal cyst removal     PROCTOSCOPY N/A 09/19/2018   Procedure: RIGID PROCTOSCOPY;  Surgeon:  Karie Soda, MD;  Location: [facility];  Service: General;  Laterality: N/A;   V-TACH ABLATION N/A 02/15/2015   PVC ablation by Dr Ladona Ridgel   Past Medical History:  Diagnosis Date   Bladder infection    10th grade   CAD (coronary artery disease)    s/p 3V CABG 2001   Cervical spondylosis 2002   History of   Colon cancer (HCC)    Diabetes mellitus (HCC)    Dysphagia    GERD (gastroesophageal reflux disease)    Heart murmur    pt has had echo   HTN (hypertension)    Hyperlipidemia    Hypothyroidism    LVH (left  ventricular hypertrophy) 2015   Mild   Myocardial infarction (HCC)    x2   PVC (premature ventricular contraction)    a. s/p ablation   Tobacco abuse, in remission    There were no vitals taken for this visit.  Opioid Risk Score:   Fall Risk Score:  `1  Depression screen [facility] 2/9     06/19/2023    1:33 PM  Depression screen PHQ 2/9  Decreased Interest 2  Down, Depressed, Hopeless 1  PHQ - 2 Score 3  Altered sleeping 1  Tired, decreased energy 3  Change in appetite 0  Feeling bad or failure about yourself  0  Trouble concentrating 0  Moving slowly or fidgety/restless 1  Suicidal thoughts 0  PHQ-9 Score 8      Review of Systems     Objective:   Physical Exam        Assessment & Plan:

## 2023-08-08 NOTE — Progress Notes (Signed)
Subjective:    Patient ID: [name] [name], male    DOB: [birth year], 77 y.o.   MRN: [id number]  HPI   [name] [name] is a 77 y.o. year old male  who  has a past medical history of Bladder infection, CAD (coronary artery disease), Cervical spondylosis (2002), Colon cancer (HCC), Diabetes mellitus (HCC), Dysphagia, GERD (gastroesophageal reflux disease), Heart murmur, HTN (hypertension), Hyperlipidemia, Hypothyroidism, LVH (left ventricular hypertrophy) (2015), Myocardial infarction (HCC), PVC (premature ventricular contraction), and Tobacco abuse, in remission.   They are presenting to PM&R clinic for follow up related to IPR admission for meningioma s/p resection 7/19-8/1.  Hx per DC documentation:  Brief HPI:   [name] [name] is a 77 y.o. right-handed male with history of CAD/CABG hypertension diabetes mellitus hyperlipidemia as well as colon cancer and bifrontal craniotomy resection for meningioma 2 years ago.  Lives with spouse independent prior to admission wife does assist with some ADLs.  Presented 05/08/2023 with altered mental status and mood changes.  CT imaging showed large bifrontal extra-axial tumor with mass effect.  Underwent planned stereotactic craniotomy resection for tumor likely meningioma 05/08/2023 per Dr. Jake Samples.  Decadron taper as indicated.  Keppra added for seizure prophylaxis.  Low-dose aspirin resumed for history of CAD.  Tolerating a regular diet.  Therapy evaluations completed due to patient's limited functional mobility was admitted for comprehensive rehab program.     Hospital Course: [name] [name] was admitted to rehab 05/25/2023 for inpatient therapies to consist of PT, ST and OT at least three hours five days a week. Past admission physiatrist, therapy team and rehab RN have worked together to provide customized collaborative inpatient rehab.  Pertaining to patient's large bifrontal axial tumor suspected meningioma status postcraniotomy resection 05/08/2023 as well as history of  meningioma 2 years ago.  Followed by neurosurgery.  Decadron taper is indicated.  He was cleared to resume low-dose aspirin for history of CAD no chest pain or shortness of breath.  Mood stabilization with the initiation of Ritalin to help patient attend to task and improve focus.  He continued on Namenda as prior to admission.  Keppra for seizure prophylaxis completing course no seizure activity.  EEG negative.  Blood sugars monitored on insulin therapy as well as Glucophage as directed with some variations due to Decadron.  Full diabetic teaching completed.  Blood pressure controlled on Lopressor and would need outpatient follow-up.  Lipitor ongoing for hyperlipidemia.  Patient did have some urinary incontinence urgency likely behavioral component maintained on Toviaz     Blood pressures were monitored on TID basis and soft and monitored   Diabetes has been monitored with ac/hs CBG checks and SSI was use prn for tighter BS control.      Rehab course: During patient's stay in rehab weekly team conferences were held to monitor patient's progress, set goals and discuss barriers to discharge. At admission, patient required minimal assist 160 feet rolling walker minimal guard sit to stand  Interval Hx:  - Therapies: Currently going to PT, OT.    - Follow ups: Dr. Jake Samples and PCP   - Falls: 2 falls since discharge; one he didn't pick up his foot high enough going up a step but caught himself. Other fall was he was cutting limbs and hauling them out of the woods and he turned around to walk out of the woods, caught his foot on something, and tripped forward. No head strike, no LOC with either fall.    - DME: No  assistive devices; he refuses to use home equipment.    - Medications: No medication changes since discharge. Still on hydrocodone for pain, will usually take 1 pill usually every day to every other day. Primary pain is in the center of his back; this is new since his fall. No images taken after  his fall; he does have a repeat MRI pending with Dr. Jake Samples.   Patient says the week after he came home form the hospital, he started having hot flashes and had a heart rate of 142. His blood sugar was low at that time, so she gave him some OJ. He would not allow his wife to call 911. He says symptoms resolved with a sublingual nitroglycerine; he's had 2 prior Mis but they presented with chest pain and not tachycardia. Has not seen his cardiologist, Dr. Albesa Seen.     - Other concerns:   Pain in his back and in the top of his head since his fall.   Wife states since his surgery "he is more quiet, he sits in his chair a lot and he is not vocal like he was before." She is also noticing singificant memory deficits since his surgery, mostly short term. "When he came home he was really good with his memory, but since then he's been going backward." Patient endorses issues with concentration and will forget sentences mid-speech.   Pain Inventory Average Pain 8 Pain Right Now 8 My pain is sharp and .  In the last 24 hours, has pain interfered with the following? General activity 8 Relation with others 8 Enjoyment of life 8 What TIME of day is your pain at its worst? morning  and evening Sleep (in general) Fair  Pain is worse with: walking, sitting, and standing Pain improves with: medication Relief from Meds: 10  Family History  Problem Relation Age of Onset   Heart attack Father 44   Diabetes Father    Diabetes Mother    Heart attack Paternal Grandfather 85   Social History   Socioeconomic History   Marital status: Married    Spouse name: Not on file   Number of children: 4   Years of education: Not on file   Highest education level: Not on file  Occupational History   Occupation: Retired Naval architect  Tobacco Use   Smoking status: Former    Current packs/day: 0.00    Average packs/day: 3.0 packs/day for 35.0 years (105.0 ttl pk-yrs)    Types: Cigarettes    Start date:  09/16/1965    Quit date: 09/16/2000    Years since quitting: 22.9   Smokeless tobacco: Never  Vaping Use   Vaping status: Never Used  Substance and Sexual Activity   Alcohol use: No   Drug use: No   Sexual activity: Not Currently  Other Topics Concern   Not on file  Social History Narrative   Left handed    Lives in a one story home    Social Determinants of Health   Financial Resource Strain: Not on file  Food Insecurity: No Food Insecurity (05/25/2023)   Hunger Vital Sign    Worried About Running Out of Food in the Last Year: Never true    Ran Out of Food in the Last Year: Never true  Transportation Needs: No Transportation Needs (05/25/2023)   PRAPARE - Administrator, Civil Service (Medical): No    Lack of Transportation (Non-Medical): No  Physical Activity: Not on file  Stress: Not on  file  Social Connections: Unknown (03/20/2022)   Received from [facility]   Social Network    Social Network: Not on file   Past Surgical History:  Procedure Laterality Date   ANTERIOR CERVICAL DISCECTOMY  03/20/2001   Many levels.  Dr Franky Macho   APPLICATION OF CRANIAL NAVIGATION N/A 05/08/2023   Procedure: APPLICATION OF CRANIAL NAVIGATION;  Surgeon: Dawley, Alan Mulder, DO;  Location: [facility] OR;  Service: Neurosurgery;  Laterality: N/A;   Bone spur removal right shoulder     CHOLECYSTECTOMY     CORONARY ARTERY BYPASS GRAFT  10/23/2000   x3 Dr Tyrone Sage   CRANIOTOMY N/A 05/08/2023   Procedure: BIFRONTAL CRANIOTOMY TUMOR EXCISION;  Surgeon: Bethann Goo, DO;  Location: [facility] OR;  Service: Neurosurgery;  Laterality: N/A;   FACIAL FRACTURE SURGERY     after being hit with baseball   MOUTH SURGERY     Pineal cyst removal     PROCTOSCOPY N/A 09/19/2018   Procedure: RIGID PROCTOSCOPY;  Surgeon: Karie Soda, MD;  Location: [facility]S;  Service: General;  Laterality: N/A;   V-TACH ABLATION N/A 02/15/2015   PVC ablation by Dr Ladona Ridgel   Past Surgical History:  Procedure  Laterality Date   ANTERIOR CERVICAL DISCECTOMY  03/20/2001   Many levels.  Dr Franky Macho   APPLICATION OF CRANIAL NAVIGATION N/A 05/08/2023   Procedure: APPLICATION OF CRANIAL NAVIGATION;  Surgeon: Dawley, Alan Mulder, DO;  Location: [facility];  Service: Neurosurgery;  Laterality: N/A;   Bone spur removal right shoulder     CHOLECYSTECTOMY     CORONARY ARTERY BYPASS GRAFT  10/23/2000   x3 Dr Tyrone Sage   CRANIOTOMY N/A 05/08/2023   Procedure: BIFRONTAL CRANIOTOMY TUMOR EXCISION;  Surgeon: Bethann Goo, DO;  Location: [facility] OR;  Service: Neurosurgery;  Laterality: N/A;   FACIAL FRACTURE SURGERY     after being hit with baseball   MOUTH SURGERY     Pineal cyst removal     PROCTOSCOPY N/A 09/19/2018   Procedure: RIGID PROCTOSCOPY;  Surgeon: Karie Soda, MD;  Location: [facility];  Service: General;  Laterality: N/A;   V-TACH ABLATION N/A 02/15/2015   PVC ablation by Dr Ladona Ridgel   Past Medical History:  Diagnosis Date   Bladder infection    10th grade   CAD (coronary artery disease)    s/p 3V CABG 2001   Cervical spondylosis 2002   History of   Colon cancer (HCC)    Diabetes mellitus (HCC)    Dysphagia    GERD (gastroesophageal reflux disease)    Heart murmur    pt has had echo   HTN (hypertension)    Hyperlipidemia    Hypothyroidism    LVH (left ventricular hypertrophy) 2015   Mild   Myocardial infarction (HCC)    x2   PVC (premature ventricular contraction)    a. s/p ablation   Tobacco abuse, in remission    BP 119/72   Pulse (!) 52   Ht 5\' 6"  (1.676 m)   Wt 184 lb 3.2 oz (83.6 kg)   SpO2 97%   BMI 29.73 kg/m   Opioid Risk Score:   Fall Risk Score:  `1  Depression screen [name] 2/9     06/19/2023    1:33 PM  Depression screen PHQ 2/9  Decreased Interest 2  Down, Depressed, Hopeless 1  PHQ - 2 Score 3  Altered sleeping 1  Tired, decreased energy 3  Change in appetite 0  Feeling bad or failure about  yourself  0  Trouble concentrating 0  Moving slowly or fidgety/restless 1   Suicidal thoughts 0  PHQ-9 Score 8     Review of Systems  All other systems reviewed and are negative.     Objective:   Physical Exam   PE: Constitution: Appropriate appearance for age. No apparent distress  Resp: No respiratory distress. No accessory muscle usage. On RA, CTAB Cardio: Well perfused appearance. No peripheral edema. Abdomen: Nondistended. Nontender.   Psych: Appropriate mood and affect. Neuro: AAOx4. No apparent cognitive deficits. CN 2-12 intact.   Neurologic Exam:   DTRs: Reflexes were 2+ in bilateral achilles, patella, biceps, BR and triceps. Babinsky: flexor responses b/l.   Hoffmans: negative b/l Sensory exam: revealed normal sensation in all dermatomal regions in bilateral upper extremities and bilateral lower extremities Motor exam: strength 5/5 throughout bilateral upper extremities and bilateral lower extremities Coordination: Fine motor coordination was normal.   Gait: normal         Assessment & Plan:   [name] [name] is a 77 y.o. year old male  who  has a past medical history of Bladder infection, CAD (coronary artery disease), Cervical spondylosis (2002), Colon cancer (HCC), Diabetes mellitus (HCC), Dysphagia, GERD (gastroesophageal reflux disease), Heart murmur, HTN (hypertension), Hyperlipidemia, Hypothyroidism, LVH (left ventricular hypertrophy) (2015), Myocardial infarction (HCC), PVC (premature ventricular contraction), and Tobacco abuse, in remission.    They are presenting to PM&R clinic for follow up related to IPR admission for meningioma s/p resection 7/19-8/1; ongoing issues include cognitive delay, memory deficits, chronic pain, and instability leading to falls.  Meningioma [facility]) Cognitive deficits Follow-up with Dr. Jake Samples, and talk to him a little bit about the cognitive deficits you are having.  They are in expected part of your injury, although you may benefit from neurocognitive testing or other medications if Ritalin is not able to  be tolerated.  Continue with therapies, and doing cognitively stimulating activities, getting outside as tolerated.  Follow-up with me in 1 months.  Tachycardia, unspecified Single instance reported of HR 140s; self-resolved with SL nitroglycerine. Has had 2 prior Mis. Did not get medically evaluated after this event.   Due to the incidence of tachycardia that she described, you will be weaning you off of Ritalin, Take 1 tablet in the morning for 3 to 4 days, then stop entirely.   I recommend that you talk to your cardiologist.  I will also be sending today's appointment note to them.     Acute midline thoracic back pain -     DG THORACOLUMBAR SPINE; Future I am getting an x-ray of your back for ongoing back pain post fall.  I will let you know when we get results back  We are going to start weaning her hydrocodone.  Your medication is refilled for 1 month at 1 tablet daily, then a second month at 1 tablet every other day, then I will stop prescribing.  I will be messaging your cardiologist about using a muscle relaxant such as methocarbamol to help.   Other orders -     HYDROcodone-Acetaminophen; Take 1 tablet by mouth daily as needed for moderate pain.  Dispense: 30 tablet; Refill: 0 -     HYDROcodone-Acetaminophen; Take 1 tablet by mouth every other day. Wean to every other day as needed  Dispense: 15 tablet; Refill: 0

## 2023-08-09 NOTE — Telephone Encounter (Signed)
error 

## 2023-08-20 ENCOUNTER — Ambulatory Visit: Payer: Medicare Other | Attending: Cardiovascular Disease | Admitting: Cardiovascular Disease
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
## 2023-08-21 ENCOUNTER — Encounter: Payer: Self-pay | Admitting: Cardiovascular Disease

## 2023-08-29 ENCOUNTER — Ambulatory Visit
Admission: RE | Admit: 2023-08-29 | Discharge: 2023-08-29 | Disposition: A | Discharge status: Home / Self Care | Payer: Medicare Other | Source: Ambulatory Visit | Attending: Physical Medicine and Rehabilitation | Admitting: Physical Medicine and Rehabilitation

## 2023-08-29 DIAGNOSIS — M546 Pain in thoracic spine: Secondary | ICD-10-CM
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
## 2023-09-05 ENCOUNTER — Ambulatory Visit: Payer: Medicare Other | Attending: Cardiovascular Disease | Admitting: Cardiology

## 2023-09-05 ENCOUNTER — Encounter: Payer: Self-pay | Admitting: Cardiology

## 2023-09-05 ENCOUNTER — Encounter: Payer: Self-pay | Admitting: Physical Medicine and Rehabilitation

## 2023-09-05 ENCOUNTER — Encounter: Payer: Medicare Other | Admitting: Physical Medicine and Rehabilitation

## 2023-09-05 VITALS — BP 122/74 | HR 63 | Ht 66.0 in | Wt 184.6 lb

## 2023-09-05 VITALS — BP 129/70 | HR 57 | Ht 66.0 in | Wt 186.0 lb

## 2023-09-05 DIAGNOSIS — I10 Essential (primary) hypertension: Secondary | ICD-10-CM

## 2023-09-05 DIAGNOSIS — I2510 Atherosclerotic heart disease of native coronary artery without angina pectoris: Secondary | ICD-10-CM | POA: Diagnosis not present

## 2023-09-05 DIAGNOSIS — E785 Hyperlipidemia, unspecified: Secondary | ICD-10-CM

## 2023-09-05 DIAGNOSIS — I493 Ventricular premature depolarization: Secondary | ICD-10-CM | POA: Diagnosis not present

## 2023-09-05 DIAGNOSIS — G4709 Other insomnia: Secondary | ICD-10-CM | POA: Diagnosis not present

## 2023-09-05 DIAGNOSIS — R4189 Other symptoms and signs involving cognitive functions and awareness: Secondary | ICD-10-CM

## 2023-09-05 DIAGNOSIS — D329 Benign neoplasm of meninges, unspecified: Secondary | ICD-10-CM

## 2023-09-05 DIAGNOSIS — R0609 Other forms of dyspnea: Secondary | ICD-10-CM

## 2023-09-05 DIAGNOSIS — M546 Pain in thoracic spine: Secondary | ICD-10-CM | POA: Diagnosis not present

## 2023-09-05 MED ORDER — METOPROLOL SUCCINATE ER 25 MG PO TB24: 25.0000 mg | ORAL_TABLET | Freq: Every day | ORAL | 1 refills | Status: DC

## 2023-09-05 MED ORDER — LIDOCAINE 5 % EX PTCH: 2.0000 | MEDICATED_PATCH | Freq: Every day | CUTANEOUS | 5 refills | Status: AC | PRN

## 2023-09-05 MED ORDER — ACETAMINOPHEN 500 MG PO TABS: 1000.0000 mg | ORAL_TABLET | Freq: Three times a day (TID) | ORAL | Status: AC | PRN

## 2023-09-05 MED ORDER — MEMANTINE HCL 5 MG PO TABS: 5.0000 mg | ORAL_TABLET | Freq: Two times a day (BID) | ORAL | 3 refills | Status: AC

## 2023-09-05 NOTE — Patient Instructions (Signed)
Medication Instructions:  DISCONTINUE Metoprolol Tartrate START Toprol XL 25mg  Take 1 tablet once a day  *If you need a refill on your cardiac medications before your next appointment, please call your pharmacy*   Lab Work: TODAY-DIRECT LDL If you have labs (blood work) drawn today and your tests are completely normal, you will receive your results only by: MyChart Message (if you have MyChart) OR A paper copy in the mail If you have any lab test that is abnormal or we need to change your treatment, we will call you to review the results.   Testing/Procedures: PLEASE SCHEDULE ECHO AND LEXISCAN (ORDERED 03/2023)   Follow-Up: At [facility], you and your health needs are our priority.  As part of our continuing mission to provide you with exceptional heart care, we have created designated Provider Care Teams.  These Care Teams include your primary Cardiologist (physician) and Advanced Practice Providers (APPs -  Physician Assistants and Nurse Practitioners) who all work together to provide you with the care you need, when you need it.  We recommend signing up for the patient portal called "MyChart".  Sign up information is provided on this After Visit Summary.  MyChart is used to connect with patients for Virtual Visits (Telemedicine).  Patients are able to view lab/test results, encounter notes, upcoming appointments, etc.  Non-urgent messages can be sent to your provider as well.   To learn more about what you can do with MyChart, go to [email or web address].    Your next appointment:   6 month(s)  Provider:   Verne Carrow, MD     Other Instructions

## 2023-09-05 NOTE — Patient Instructions (Addendum)
Things have improved considerably regarding your mood and sleep since last evaluation, and you should be reassured that you are recovering well.  For memory deficits, I am referring you to our neuropsychologist for testing and recommendations.  I have also refilled your Namenda 5 mg twice daily.  For pain in the middle of your back, shoulders, and neck, I am prescribing lidocaine patches and having you follow-up in clinic for trigger point injections.  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  If no improvement with this, next appointment we may talk about a referral for medial branch block injections with Dr. Wynn Banker.  Given concerns for return to driving with cognitive deficits, I gave you a handout on OT driving evaluations.  Please contact Center for evaluation and let me know if you need a formal referral.  I do not recommend return to driving until this evaluation is complete.  Follow-up with me in 3 months, or sooner if any urgent needs.
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
## 2023-09-05 NOTE — Progress Notes (Signed)
Cardiology Office Note:   Date:  09/05/2023  ID:  [name] [name], [name] [name] 1946/10/01, MRN [id number] PCP: Kaleen Mask, MD  [facility] Providers Cardiologist:  Verne Carrow, MD    History of Present Illness:   Discussed the use of AI scribe software for clinical note transcription with the patient, who gave verbal consent to proceed.  77 yo male with history of CAD s/p 3V CABG in 2001, HTN, DM, PVCs, colon cancer, IBS, GERD and HLD who is here today for cardiac follow up. His last cath was in December 2001 at which time his LAD shut down during attempted rotablator atherectomy leading to 3V CABG. He has undergone PVC ablation in April of 2016. Nuclear stress test in August 2019 with no ischemia. Resection of colon mass on 09/19/18. Cardiac monitor December 2019 and February 2020 with PVCs, PACs and 8 beat run NSVT. His beta blocker was increased. He was seen in our office in February 2024 and was doing well. He was admitted to [facility] in April 2024 with dehydration, acute kidney injury in the setting of acute pancreatitis. EKG during that admission with sinus rhythm. Patient seen in May 2024 and he reported dyspnea with exertion and leg weakness with walking with daily fatigue. No chest pain. Overall feeling poorly. An echo and stress test were ordered but not completed due to patient being admitted with meningioma and undergoing resection in July 2024.   He presents today with ongoing fatigue and exertional intolerance. The patient reports that he has been feeling unwell for several months. He describes an isolated episode in August where he experienced a rapid heart rate of 140 which patient and spouse say was managed at home with nitroglycerin. (This event also noted on 08/08/23 rehab med note and he was weaned off Ritalin). Patient denies recurrence.   The patient's partner has been monitoring his heart rate at home via auscultation and reports that she has been noticing skipped  beats. The patient denies feeling any palpitations or skipped beats.   The patient also reports memory issues today following his brain surgery, says that he often forgets what he was talking about or why he got up from his chair. He has been able to continue doing yard work, but notes that he needs to take more breaks than he used to. He denies any recent chest pain but does report shortness of breath with exertion. Says that in the last week he needed to take breaks while on the riding lawn mower because he became fatigued manipulating the levers.   The patient also has a history of diabetes, which is currently managed by his primary care provider. He reports that his blood sugar levels have been high, often reaching 300, despite being on insulin. Notable that his A1c was 10.2 in the summer.  Studies Reviewed:    EKG:   EKG Interpretation Date/Time:  Wednesday September 05 2023 09:21:15 EDT Ventricular Rate:  63 PR Interval:  176 QRS Duration:  88 QT Interval:  446 QTC Calculation: 456 R Axis:   -29  Text Interpretation: Normal sinus rhythm Nonspecific T wave abnormality When compared with ECG of 21-May-2023 15:41, QRS axis Shifted right Non-specific change in ST segment in Lateral leads Nonspecific T wave abnormality, worse in Lateral leads Confirmed by Perlie Gold [phone]) on 09/05/2023 9:55:50 AM    Risk Assessment/Calculations:              Physical Exam:   VS:  BP 122/74  Pulse 63   Ht 5\' 6"  (1.676 m)   Wt 184 lb 9.6 oz (83.7 kg)   SpO2 97%   BMI 29.80 kg/m    Wt Readings from Last 3 Encounters:  09/05/23 186 lb (84.4 kg)  09/05/23 184 lb 9.6 oz (83.7 kg)  08/08/23 184 lb 3.2 oz (83.6 kg)     Physical Exam Vitals reviewed.  Constitutional:      Appearance: Normal appearance.  HENT:     Head: Normocephalic.  Eyes:     Pupils: Pupils are equal, round, and reactive to light.  Cardiovascular:     Rate and Rhythm: Normal rate and regular rhythm.     Pulses: Normal  pulses.     Heart sounds: Normal heart sounds.  Pulmonary:     Effort: Pulmonary effort is normal.     Breath sounds: Normal breath sounds.  Abdominal:     General: Abdomen is flat.     Palpations: Abdomen is soft.  Musculoskeletal:     Right lower leg: No edema.     Left lower leg: No edema.  Skin:    General: Skin is warm and dry.     Capillary Refill: Capillary refill takes less than 2 seconds.  Neurological:     General: No focal deficit present.     Mental Status: He is alert and oriented to person, place, and time. Mental status is at baseline.     Comments: Mild memory deficits noted  Psychiatric:        Mood and Affect: Mood normal.        Behavior: Behavior normal.        Thought Content: Thought content normal.        Judgment: Judgment normal.      ASSESSMENT AND PLAN:    CAD s/p CABG Reports of progressive fatigue and decreased exercise tolerance. No recent chest pain consistent with angina or acute shortness of breath. -Patient's general decline following brain surgery this summer and memory deficits make assessment more challenging. Given stress test and echo were previously recommended by Dr. Clifton James, suggest that patient proceed with both.  -Continue daily aspirin, metoprolol, and atorvastatin 10mg . -Switch to once-daily toprol XL 25mg  (med list shows Metoprolol tartrate 25mg  only once daily).   Hypertension Blood pressure stable/at goal. As above, switch from Metoprolol Tartrate to Toprol XL.   Hyperlipidemia Patient's cholesterol has historically been followed by PCP per notes but I cannot find any recent labs. Patient not fasting today but will evaluate direct LDL. -Continue Atorvastatin 10mg  pending lab review.   PVCs/Bigeminy Patient without any symptoms to indicate recurrence s/p PVC ablation in April. Spouse reports hearing skipped beats from time to time. ECG today without PVCs. -Continue beta blocker  Diabetes Mellitus A1c was 10.2 in the  summer, indicating poor glycemic control. Reports of fatigue, which could be related to high blood sugar levels. -Recommend close follow-up with primary care provider   Follow-up in 6 months assuming stress test and echocardiogram are normal.          Signed, Perlie Gold, PA-C

## 2023-09-06 ENCOUNTER — Telehealth: Payer: Self-pay

## 2023-09-06 DIAGNOSIS — E785 Hyperlipidemia, unspecified: Secondary | ICD-10-CM

## 2023-09-06 LAB — LDL CHOLESTEROL, DIRECT: LDL Direct: 87 mg/dL (ref 0–99)

## 2023-09-06 MED ORDER — ATORVASTATIN CALCIUM 20 MG PO TABS: 20.0000 mg | ORAL_TABLET | Freq: Every day | ORAL | 3 refills | Status: DC

## 2023-09-06 NOTE — Telephone Encounter (Signed)
-----   Message from Perlie Gold sent at 09/06/2023 11:22 AM EDT ----- LDL of 87 is above goal. Ideally in patients with prior CABG, we try to get LDL closer to 55. Would recommend increasing Atorvastatin to 20mg  and repeating lipid panel, hepatic function panel in 8 weeks.

## 2023-09-06 NOTE — Telephone Encounter (Signed)
The patient's wife has been notified of the result and verbalized understanding.  All questions (if any) were answered. Frutoso Schatz, RN 09/06/2023 3:59 PM  Prescription for atorvastatin 20 mg has been sent in. Labs have been ordered.

## 2023-09-07 ENCOUNTER — Telehealth: Payer: Self-pay

## 2023-09-07 NOTE — Telephone Encounter (Signed)
PA for Lidocaine Patch submitted in Cover My Meds.

## 2023-09-21 ENCOUNTER — Other Ambulatory Visit (HOSPITAL_COMMUNITY): Payer: Self-pay

## 2023-10-02 NOTE — Progress Notes (Signed)
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 Will discuss at his appointment tomorrow, possible MRI and spine referral.

## 2023-10-03 ENCOUNTER — Encounter: Payer: Medicare Other | Attending: Registered Nurse | Admitting: Physical Medicine and Rehabilitation

## 2023-10-03 ENCOUNTER — Encounter: Payer: Self-pay | Admitting: Physical Medicine and Rehabilitation

## 2023-10-03 VITALS — BP 161/77 | HR 52 | Ht 66.0 in | Wt 184.0 lb

## 2023-10-03 DIAGNOSIS — M546 Pain in thoracic spine: Secondary | ICD-10-CM | POA: Diagnosis present

## 2023-10-03 DIAGNOSIS — M7918 Myalgia, other site: Secondary | ICD-10-CM | POA: Diagnosis present

## 2023-10-03 MED ORDER — LIDOCAINE HCL 1 % IJ SOLN
12.0000 mL | Freq: Once | INTRAMUSCULAR | Status: AC
Start: 2023-10-03 — End: 2023-10-03
  Administered 2023-10-03: 12 mL

## 2023-10-03 MED ORDER — TRIAMCINOLONE ACETONIDE 40 MG/ML IJ SUSP
5.0000 mg | Freq: Once | INTRAMUSCULAR | Status: AC
Start: 2023-10-03 — End: 2023-10-03
  Administered 2023-10-03: 5.2 mg via INTRAMUSCULAR

## 2023-10-03 NOTE — Progress Notes (Signed)
HPI:   [name] [name] is a 77 y.o. 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 who presents to clinic for treatment of pain related to mid-thoracic and low back myofascial pain  via injection as described below.   No new concerns or complaints. No major changes in medical history since last visit. Xrays reviewed with the patient, did not show any signs of acute fracture status post fall however patient reports that his pain was higher up and may not have been captured with these images.  Physical Exam:  General: Appropriate appearance for age.  Mental Status: Appropriate mood and affect.  Cardiovascular: RRR, no m/r/g.  Respiratory: CTAB, no rales/rhonchi/wheezing.  Skin: No apparent rashes or lesions.  Neuro: Awake, alert, and oriented x3. No apparent deficits.  MSK: Moving all 4 limbs antigravity and against resistance.  + TTP  bilateral levator scapulae, bilateral thoracic and lumbar paraspinals.  Full active range of motion in back flexion and extension, no palpable deformity or spinous process tenderness over mid thoracic region.  PROCEDURE: Trigger point injections Diagnosis: M79.18  Goals with treatment: [x ]  Decrease pain [x]  Improve Active / Passive ROM [  ] Improve ADLs [ x ] Improve functional mobility  MEDICATION:  Kenalog 40 mg/mL - 0.2 ccs Lidocaine 1% - 6 ccs   CONSENT: Obtained in writing followed by time-out per clinic policy. Consent uploaded to chart.  Benefits discussed.  Risks discussed included, but were not limited to, pain and discomfort, bleeding, bruising, allergic reaction, infection. All questions answered to patient/family member/guardian/ caregiver satisfaction. They would like to proceed with procedure. There are no noted contraindications to procedure.  PROCEDURE Time out was preformed No heat sources No antibiotics  The patient was explained about both the benefits and risks of a trigger point injection. After the patient acknowledged an understanding of the risks and benefits, the patient agreed to proceed. The area was first marked and then prepped in an aseptic fashion with betadine / alcohol. A 30 g, 1/2 inch needle was directed via a posterior approach into the bilateral levator scapulae, bilateral thoracic and lumbar paraspinals. The injection was completed with Kenalog 0.2 cc mixed with 6cc of 1% lidocaine after no blood was aspirated on pull back.  The pt tolerated the procedure well. The patient reported immediate relief of the pain and improved pain free range of motion. No complications were encountered.   Impression: HPI: [name] [name] is a 77 y.o. 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 who presents to clinic for treatment of mid-thoracic and low back myofascial pain . They received a  Bilateral  trigger point injections as above.   PLAN: - Resume Usual Activities. Notify Physician of any unusual bleeding, erythema or concern for side effects as reviewed above. - Apply ice prn for pain - Tylenol prn for pain Follow-up as scheduled  Patient/Care Giver was ready to learn without apparent learning barriers. Education was provided on diagnosis, treatment options/plan according to patient's preferred learning style. Patient/Care Giver verbalized understanding and agreement with the above plan.   Angelina Sheriff, DO 10/03/2023

## 2023-10-03 NOTE — Patient Instructions (Signed)
Follow up as scheduled.  

## 2023-10-10 ENCOUNTER — Telehealth (HOSPITAL_COMMUNITY): Payer: Self-pay | Admitting: *Deleted

## 2023-10-10 NOTE — Telephone Encounter (Signed)
Patient given detailed instructions per Myocardial Perfusion Study Information Sheet for the test on 10/16/23 at 10:15. Patient notified to arrive 15 minutes early and that it is imperative to arrive on time for appointment to keep from having the test rescheduled.  If you need to cancel or reschedule your appointment, please call the office within 24 hours of your appointment. . Patient verbalized understanding.Daneil Dolin

## 2023-10-12 NOTE — Telephone Encounter (Signed)
err

## 2023-10-16 ENCOUNTER — Ambulatory Visit (HOSPITAL_COMMUNITY): Payer: Medicare Other | Attending: Cardiovascular Disease

## 2023-10-16 ENCOUNTER — Ambulatory Visit (HOSPITAL_BASED_OUTPATIENT_CLINIC_OR_DEPARTMENT_OTHER): Payer: Medicare Other

## 2023-10-16 DIAGNOSIS — I25118 Atherosclerotic heart disease of native coronary artery with other forms of angina pectoris: Secondary | ICD-10-CM | POA: Diagnosis present

## 2023-10-16 DIAGNOSIS — E785 Hyperlipidemia, unspecified: Secondary | ICD-10-CM | POA: Insufficient documentation

## 2023-10-16 DIAGNOSIS — I10 Essential (primary) hypertension: Secondary | ICD-10-CM | POA: Insufficient documentation

## 2023-10-16 DIAGNOSIS — R0602 Shortness of breath: Secondary | ICD-10-CM | POA: Diagnosis not present

## 2023-10-16 LAB — MYOCARDIAL PERFUSION IMAGING
LV dias vol: 77 mL (ref 62–150)
LV sys vol: 34 mL
Nuc Stress EF: 56 %
Peak HR: 101 {beats}/min
Rest HR: 57 {beats}/min
Rest Nuclear Isotope Dose: 10.7 mCi
SDS: 2
SRS: 7
SSS: 10
ST Depression (mm): 0 mm
Stress Nuclear Isotope Dose: 32.5 mCi
TID: 1.03

## 2023-10-16 LAB — ECHOCARDIOGRAM COMPLETE
Area-P 1/2: 2.13 cm2
P 1/2 time: 993 ms
S' Lateral: 3.2 cm

## 2023-10-16 MED ORDER — REGADENOSON 0.4 MG/5ML IV SOLN
0.4000 mg | Freq: Once | INTRAVENOUS | Status: AC
  Administered 2023-10-16: 0.4 mg via INTRAVENOUS

## 2023-10-16 MED ORDER — TECHNETIUM TC 99M TETROFOSMIN IV KIT
10.7000 | PACK | Freq: Once | INTRAVENOUS | Status: AC | PRN
  Administered 2023-10-16: 10.7 via INTRAVENOUS

## 2023-10-16 MED ORDER — TECHNETIUM TC 99M TETROFOSMIN IV KIT
32.5000 | PACK | Freq: Once | INTRAVENOUS | Status: AC | PRN
  Administered 2023-10-16: 32.5 via INTRAVENOUS

## 2023-11-12 ENCOUNTER — Other Ambulatory Visit: Payer: Self-pay

## 2023-12-10 ENCOUNTER — Encounter: Payer: Medicare Other | Attending: Registered Nurse | Admitting: Physical Medicine and Rehabilitation

## 2023-12-10 DIAGNOSIS — M7918 Myalgia, other site: Secondary | ICD-10-CM | POA: Insufficient documentation

## 2023-12-10 DIAGNOSIS — M546 Pain in thoracic spine: Secondary | ICD-10-CM | POA: Insufficient documentation

## 2024-01-08 ENCOUNTER — Other Ambulatory Visit: Payer: Self-pay

## 2024-01-14 ENCOUNTER — Encounter: Payer: Medicare Other | Attending: Registered Nurse | Admitting: Psychology

## 2024-01-14 DIAGNOSIS — F0670 Mild neurocognitive disorder due to known physiological condition without behavioral disturbance: Secondary | ICD-10-CM | POA: Diagnosis present

## 2024-01-14 DIAGNOSIS — D329 Benign neoplasm of meninges, unspecified: Secondary | ICD-10-CM | POA: Diagnosis present

## 2024-01-14 DIAGNOSIS — R4189 Other symptoms and signs involving cognitive functions and awareness: Secondary | ICD-10-CM | POA: Diagnosis present

## 2024-01-16 ENCOUNTER — Other Ambulatory Visit: Payer: Self-pay

## 2024-01-18 NOTE — Progress Notes (Addendum)
 NEUROPSYCHOLOGICAL EVALUATION [name] [name]. [facility]  Physical Medicine and Rehabilitation     Patient: [name] [name]  MRN: [id number] DOB: 07-Apr-1946  Age: 78 y.o. Sex: male  Race/Ethnicity: White or Caucasian  Years of Education: 12  Collateral Information Source: Wife  Referring Provider: Elijah Birk, DO   Provider/Clinical Neuropsychologist: Thelma Comp, PsyD  Date of Service: 01/14/24 Start Time: 3:00 PM End Time: 5:00 PM  Location of Service:  [facility] Physical Medicine & Rehabilitation Department [place]. [facility] [address] [place], [place] [address] Phone: [phone]  Billing Code/Service:            [id number]  Individuals Present: Patient was accompanied by his wife and the visit was conducted by provider in provider's office. 1 hour and 15 minutes spent in face-to-face clinical interview and remaining 45 minutes was spent in record review, documentation, and testing protocol construction.    PATIENT CONSENT AND CONFIDENTIALITY  The patient's understanding of the reason for referral was intact. Discussed limits of confidentiality including, but not limited to, posting of final evaluation report in the patient's electronic medical record for both the patient and for the referring provider and appropriate medical professionals. Patient was given the opportunity to have their questions answered. The neuropsychological evaluation process was discussed with the patient and they consented to proceed with the evaluation.  Consent for Evaluation and Treatment: Signed: Yes Explanation of Privacy Policies: Signed: Yes Discussion of Confidentiality Limits: Yes  REASON FOR REFERRAL  The patient was referred for neuropsychological evaluation for evaluation of memory changes and cognitive deficits within the context of bifrontal craniotomy resection for meningioma in July of 2024.   Per records, the patient was seen by [facility]  Neurology on 03/06/23 for memory concerns. He scored 21/30 on MoCA. Records state he no longer drives as he was discovered to be speeding far above the speed limit. He was reportedly independent with ADLs. Records from that visit indicate the patient's spouse reported observing memory difficulties in the patient dating to at least 2015, but a worsening over the 7 to 8 months preceding the visit.  A workup including MRI of the brain was ordered. He completed an MRI on 04/19/23 which showed a large meningioma, and he was admitted to the hospital via ER for worsening confusion and weakness. He was admitted on 05/08/2023 and underwent planned resection for meningioma. Records indicated he was evaluated by PT/OT and discharged to home on 05/12/2023.   Records indicate the patient returned to the ED on 05/21/23, HPI indicated family reported worsening behavioral changes (i.e., confusion, agitated behavior) and he was ultimately admitted for inpatient rehabilitation. Records from PM&R indicated "Workup was significant for slightly increased epidural fluid collection in the bifrontal cranioplasty site with mass effect on the frontal lobes, as well as new parenchymal hemorrhage in the anterior frontal lobes," and cognitive/behavioral changes consistent frontal dysfunction. Following improvements, he was discharged on 06/07/23. Records from a outpatient follow-up visit with PM&R indicated some ongoing cognitive difficulties consistent with his neurologic injury. Neurocognitive testing was ordered.    Upon interview, the patient, accompanied by his wife with his permission, reported concerns about the patient's cognitive functioning. They hoped to understand the reason for the changes and what could be done to improve symptoms/functioning.  HISTORY OF PRESENTING CONCERNS  Cognitive Symptom Onset & Course: During the interview, the patient readily answered all questions but largely deferred to his wife for information regarding  symptom onset and course.  Consistent with reports noted in the medical record review, per his spouse, the patient began to show difficulties with attention/concentration and memory starting around 2015. Difficulties progressed over time. Cognitive symptoms have overall improved following acute recovery following surgery, but not returned to baseline.    Current Cognitive Complaints:  Memory:  Patient - The patient endorsed changes in short term memory involving forgetting details of conversations, recent events, and misplacing things. He feels back to pre-surgery baseline overall. Collateral - His spouse reported observing difficulties in remembering recent events, conversations, and misplacing things. She assists with medications and is concerned he would forget to take them.    Attention & Concentration:  Patient - The patient endorsed difficulties with attention and concentration, frequently losing train of thought, and struggling with distractibility.  Collateral - His spouse reported observing the same difficulties described by the patient but feels they have worsened.   Processing Speed:  Patient - The patient denied noticing any changes in processing speed.  Collateral - His spouse reported noticing indications of slowed processing relative to pre-surgery.     Language:  Patient - The patient reported difficulties with word-finding that predate the surgery. He endorsed occasional word-use errors.  Collateral - She feels the above difficulties have worsened.  Visual-Spatial:  Patient - The patient reported no clear indications of visual-spatial deficits. Collateral - Consistent with patient's report.    Executive Functioning:  Patient - The patient described decreased frustration tolerance.  Collateral - His spouse reported noticing changes in the patient's behavior and personality. She indicated that he was always a slow driver but began speeding and tailgating last year. She  described him as historically being short tempered and "stubborn" and that this has changed markedly. She reported him being much more sentimental and "really sweet." She described him being much more indecisive and reliant on her. She reported reduced activity levels overall, and that he was far less talkative than in the past.    Motor/Sensory Complaints: Denied changes in sense of smell. Reported changes in taste dating to 2001. Reported some age-consistent vision and hearing changes. Reported that it has "been a while" since his last hearing test. He reported experiencing tremor, predominantly in his left hand, present throughout his life. His wife reported noticing changes in his balance and gait since the surgery, with four falls since then. He reported some lightheadedness with postural changes.  (Tremor, balance, walking/gait, coordination.  Smell, taste, vision, hearing)  Emotional and Behavioral Functioning: Currently, the patient describes as having low mood most days and not enjoying previously enjoyable activities as much. His wife reported noticing that he is much quieter and seems less happy. He denied any history of suicide attempt but had suicidal ideation approximately 30 years ago. He denied any current suicidal ideation. He denied any current or past difficulties with difficult to control worry or anxiety. He denied any current or past psychiatric treatment in any form. He reported one instance of a possible visual hallucination (seeing silhouette of someone in the hallway) approximately three to four months ago, but nothing prior or since then. He denies any paranoia, symptoms of mania, indications of psychosis, or homicidal ideation past or present. The patient does acknowledge a history of depression.   Sleep: Sleep was reported to be increased relative to baseline. He typically sleeps 9 hours per night. He reported some difficulty with sleep onset that is new since the surgery but no  problems with sleep maintenance. He denies any RBS or sleep apnea.  He reports reduced energy levels overall.  Appetite: Slight decrease in appetite, no significant unintentional weight loss.   Caffeine: None. Alcohol Use: None.  Tobacco Use: None since heart attack in 2001. Smoked 2.5 to 3 packs per day. Started smoking at age 20.  Recreational Substance Use: None.   Level of Functional Independence: The patient is independent with basic activities of daily living. His spouse reports that he bathes less often than in the past.  Finances: Managed by spouse, unchanged from baseline.  Shopping: Occasionally repurchases items he already has (shirts)  Household Maintenance / Chores: Able to do independently, but some problems with motivation.  Meal Preparation: Able to do independently.    Medication Management: Receives assistance from spouse since surgery. His spouse feels he would have difficulty remembering to take medications on his own.    Driving: Stopped driving due to family concerns about safety related to speeding, which was a behavior change prior to surgery.     Medical History/Record Review: He reported one head injury during his youth that resulted in loss of consciousness. He played football for four years in high school. He reported having had two heart attacks (2001). Neither the patient nor his spouse reported noticing any cognitive changes following heart attack. The patient denied any treatment involving chemotherapy or radiation of the head.   Past Medical History:  Diagnosis Date   Bladder infection    10th grade   CAD (coronary artery disease)    s/p 3V CABG 2001   Cervical spondylosis 2002   History of   Colon cancer (HCC)    Diabetes mellitus (HCC)    Dysphagia    GERD (gastroesophageal reflux disease)    Heart murmur    pt has had echo   HTN (hypertension)    Hyperlipidemia    Hypothyroidism    LVH (left ventricular hypertrophy) 2015   Mild   Myocardial  infarction (HCC)    x2   PVC (premature ventricular contraction)    a. s/p ablation   Tobacco abuse, in remission    Patient Active Problem List   Diagnosis Date Noted   Myofascial muscle pain 10/03/2023   Other insomnia 09/05/2023   Tachycardia, unspecified 08/08/2023   Acute midline thoracic back pain 08/08/2023   Mild neurocognitive disorder due to another medical condition 06/04/2023   Meningioma (HCC) 05/21/2023   Brain tumor (HCC) 05/08/2023   S/P craniotomy 05/08/2023   Brain mass 04/19/2023   Acute metabolic encephalopathy 04/19/2023   Unintentional weight loss 02/23/2023   Cognitive deficits 02/23/2023   Dysphagia 02/23/2023   Abdominal pain 02/23/2023   Pancreatitis 02/22/2023   Nausea & vomiting 09/27/2018   Ileus, postoperative (HCC) 09/27/2018   IBS (irritable bowel syndrome) 09/23/2018   Hypomagnesemia 09/20/2018   Cancer of descending colon s/p robotic left hemicolectomy 09/19/2018 08/12/2018   Ventricular tachycardia (HCC) 02/15/2015   PVC's (premature ventricular contractions) 01/20/2015   CORONARY ATHEROSCLEROSIS NATIVE CORONARY ARTERY 08/23/2009   OTHER MALAISE AND FATIGUE 08/23/2009   Essential hypertension, benign 02/22/2009   Diabetes mellitus type 2, noninsulin dependent (HCC) 02/19/2009   Hyperlipidemia 02/19/2009   CAD, ARTERY BYPASS GRAFT 02/19/2009   Imaging/Lab Results:  04/19/2023 MRI HEAD WITHOUT AND WITH CONTRAST  "IMPRESSION: 1. Extra-axial dural-based mass overlying the left greater than right anterior frontal lobes. Contiguous dural thickening and enhancement extending inferiorly and posteriorly along the falx, and along the bilateral frontal lobes. The constellation of findings is favored to reflect a meningioma over dural-based metastatic disease. The  mass invades the anterior third of the superior sagittal sinus. Mass effect upon the underlying brain parenchyma as described. Moderate vasogenic edema within the underlying left frontal  lobe. 2. Mild generalized parenchymal atrophy."  05/21/2023 CT HEAD WITHOUT CONTRAST  "IMPRESSION: 1. Interval evolution of postoperative changes from recent prior frontal mass resection. 2. An epidural collection deep to the bifrontal cranioplasty has increased in size since the brain MRI of 05/09/2023, now measuring up to 2.8 cm in thickness (previously 2.3 cm). Mass effect on the underlying frontal lobes (left greater than right). Underlying parenchymal edema within the anterior left frontal lobe is similar to the prior MRI. Underlying parenchymal edema within the anterior right frontal lobe is new. Persistent partial effacement of the frontal horn of the left lateral ventricle. Partial effacement of the frontal horn of the right lateral ventricle, new. 3. There are foci of parenchymal hemorrhage (measuring up to 10 mm) within the anterior frontal lobes, bilaterally. These foci were not definitively present on the prior MRI. 4. Complete opacification of the left frontal sinus, similar to the prior exam."  Family Neurologic/Medical Hx: Per patient and spouse, the patient's mother showed signs of dementia for three to four years prior to her death at age 54.  Family History  Problem Relation Age of Onset   Heart attack Father 25   Diabetes Father    Diabetes Mother    Heart attack Paternal Grandfather 32   Medications:  pantoprazole (PROTONIX) 40 MG tablet metoprolol succinate (TOPROL XL) 25 MG 24 hr tablet aspirin 81 MG tablet Tamsulosin 0.5mg   atorvastatin (LIPITOR) 10 MG tablet Levothyroxine Sodium 75 MCG CAPS memantine (NAMENDA) 5 MG tablet BID cyanocobalamin (VITAMIN B12) 1000 MCG tablet Multiple Vitamin (MULTIVITAMIN WITH MINERALS) TABS  Cholecalciferol (D2000 ULTRA STRENGTH) 50 MCG (2000 UT)  Educational/Vocational History: The patient graduated high school and was able to earn As and Bs when he applied himself. He reported some weaknesses in reading and concentration  while in school. He denied any attentional problems outside the classroom. 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 He had one prior marriage of 10 years. He has an adult daughter who lives in [place].  Living Situation: Alone with his wife.  Daily Activities/Hobbies: Decreased activity levels overall compared to pre-surgery. He primarily watches television and occasionally goes fishing near his home.   Behavioral Observations/Mental Status: The patient was seen on an outpatient basis in the [facility] PM&R office for the clinical interview accompanied by his wife. His wife provided collateral, with patient's consent.  Orientation: The patient was oriented to self and knew the reason for the evaluation. Ability to Participate in Interview: The patient readily answered all questions seemingly to the best of his ability but deferred to his wife for symptom history within recent years.  Attitude: Cooperative, friendly, and talkative. Attention: Frequently became tangential at times, requiring occasional prompting.  Conversational Language: Spontaneous speech was prosodic, fluent, and well-articulated. Receptive language appeared intact.  Thought Process/Content: Tangential, but largely goal directed.  Motor Problems: Ambulated independently. Mild hand tremor in action, predominantly on his left.  Affect/Mood: His mood was good, and affect was congruent.  Insight: Limited. Behavior/Interactions: Tangential, but pleasant. Inclined to make frequent jokes. Talkative.  SUMMARY / CLINICAL IMPRESSIONS  The patient was referred for neuropsychological evaluation for evaluation of memory changes and cognitive deficits within the context of  bifrontal craniotomy resection for meningioma in July of 2024. Following successful resection, the patient was discharged home but later  returned and was re-admitted due to confusion and agitation that necessitated inpatient rehabilitation. Cognitive changes were first noted approximately ten years prior involving problems with memory and concentration. Symptoms reportedly worsened over time. Some behavioral symptoms were reported (e.g., speeding) shortly before detection of the meningioma and resection. Per collateral report, the patient's prior cognitive symptoms worsened. Additional symptoms consistent with frontal lobe dysfunction are also reported to be present. The patient has a medical history including, but not limited to, cardiovascular disease (a risk factor for cerebrovascular disease). Brain imaging shows post-surgical changes and mild generalized parenchymal atrophy. Psychiatrically, the patient acknowledges a history of depression but denies any previous treatment. He currently endorses symptoms suggestive of possible depression. The patient receives some assistance from his wife for instrumental activities of daily living.      The patient presents with cognitive changes consistent with changes associated with the patient's history of large meningioma, that was detected when it became symptomatic, and the required surgical intervention. The patient presents with symptoms dating to around ten years prior which could be related and independent (i.e., related to early cerebrovascular changes). A formal neurocognitive evaluation is indicated in order to delineate the patient's cognitive functioning and to guide treatment planning and recommendations to facilitate optimal quality of life and functioning.   DISPOSITION / PLAN   The patient has been set up for a formal neuropsychological assessment to objectively assess her cognitive functioning across domains to establish the patient's cognitive profile. This data, in conjunction with information obtained via clinical interview and medical record review, will help clarify likely  etiology and guide treatment recommendations. Once data collection and interpretation have been completed, the findings / diagnosis and recommendations will be reviewed and discussed with the patient during a feedback appointment with the neuropsychologist. Based on the collaborative dialogue with the patient during the feedback, recommendations may be adjusted / tailored as needed. A formal report will be produced and provided to the patient and the referring provider.   Diagnosis: Mild neurocognitive disorder due to another medical condition     This report was generated using voice recognition software. While this document has been carefully reviewed, transcription errors may be present. I apologize in advance for any inconvenience. Please contact me if further clarification is needed.             Thelma Comp, PsyD             Neuropsychologist

## 2024-01-21 ENCOUNTER — Encounter (HOSPITAL_BASED_OUTPATIENT_CLINIC_OR_DEPARTMENT_OTHER): Payer: Medicare Other

## 2024-01-21 DIAGNOSIS — D369 Benign neoplasm, unspecified site: Secondary | ICD-10-CM

## 2024-01-21 DIAGNOSIS — F0670 Mild neurocognitive disorder due to known physiological condition without behavioral disturbance: Secondary | ICD-10-CM

## 2024-01-21 DIAGNOSIS — R4189 Other symptoms and signs involving cognitive functions and awareness: Secondary | ICD-10-CM | POA: Diagnosis not present

## 2024-01-21 NOTE — Progress Notes (Unsigned)
 Behavioral Observations: The patient appeared well-groomed and appropriately dressed. His manners were polite and appropriate to the situation. The patient's attitude towards testing was positive and his effort was good. The patient's sight and hearing were adequate for testing demands. No abnormalities of gait or mobility were noted. No lapses in attention were observed for the duration of testing, although the patient was talkative at times between subtests. The patient reported that he had sufficient sleep the night prior. The patient also reported some mild anxiety with regards to his performance on testing. The patient appeared to have marked difficulty on the WTAR as well as on the Color Word and Similarities subtests.  Neuropsychology Note  [name] [name] completed 120 minutes of neuropsychological testing with technician, Staci Acosta, BA, under the supervision of Doy Mince, PsyD., Clinical Neuropsychologist. The patient did not appear overtly distressed by the testing session, per behavioral observation or via self-report to the technician. Rest breaks were offered.   Clinical Decision Making: In considering the patient's current level of functioning, level of presumed impairment, nature of symptoms, emotional and behavioral responses during clinical interview, level of literacy, and observed level of motivation/effort, a battery of tests was selected by Dr. Matt Holmes during initial consultation on 01/14/23. This was communicated to the technician. Communication between the neuropsychologist and technician was ongoing throughout the testing session and changes were made as deemed necessary based on patient performance on testing, technician observations and additional pertinent factors such as those listed above.  Tests Administered: Automatic Data Edition (BNT-2) Brief Visuospatial Memory Test-Revised (BVMT-R) Clock Drawing Test Controlled Oral Word Association Test (FAS &  Animals) Delis-Kaplan Executive Function System (D-KEFS), select subtests Finger Tapping Test (FTT) Hopkins Verbal Learning Test - Revised (HVLT-R) Repeatable Battery for the Assessment of Neuropsychological Status Update (RBANS) Trail Making Test (TMT; Part A & B) Wechsler Adult Intelligence Scale-Fourth Edition (WAIS-IV), select subtests Wechsler Memory Scale-Fourth Edition (WMS-IV)  Wechsler Memory Scale-Third Edition (WMS-III)  Wechsler Test of Adult Reading (WTAR) Geriatric Depression Scale-Short Form (GDS-SF) Geriatric Anxiety Inventory (GAI)  Results:  Intellectual/Premorbid Functioning Estimate     Norm Score Percentile  Range  Wechsler Test of Adult Reading    SS = 78 7 %ile Below Average   ATTENTION AND WORKING MEMORY      Norm Score Percentile  Range  WAIS-IV            Digit Span    ss = 6 9 %ile Low Average   DSF    ss = 8 25 %ile Average   Span:      5      DSB    ss = 8 25 %ile Average   Span:      3      DSS    ss = 5 5 %ile Below Average   Span:      3     WMS-III            Spatial Span    ss = 15 95 %ile Above Average   SSF    ss = 12 75 %ile High Average   Span:      6      SSB    ss = 16 98 %ile Exceptionally High   Span:      6       PROCESSING SPEED      Norm Score Percentile Range  WAIS-IV            Cancellation*  ss = 10 50 %ile Average  RBANS            Coding    ss = 7 16 %ile Low Average    PSYCHOMOTION      Norm Score Percentile  Range  Finger Tapping Test - Dominant    t = 44 27 %ile Average  Finger Tapping Test - Nondominant    t = 42 21 %ile Low Average    LANGUAGE      Norm Score Percentile  Range  [place] Naming Test (BNT-2)    t = 50 50 %ile Average  COWAT            FAS    t = 38 12 %ile Low Average   Animals    t = 65 94 %ile Above Average  DKEFS - Verbal Fluency            Category Switching    ss = 9 37 %ile Average   Switching Accuracy    ss = 9 37 %ile Average    EXECUTIVE FUNCTIONING      Norm Score Percentile Range   DKEFS - Color-Word Interference            Color Naming    ss = 9 37 %ile Average   Word Reading    ss = 9 37 %ile Average   Inhibition    ss = D/C      Errors    ss = D/C     Trails A    t = 52 58 %ile Average   Errors      0     Trails B    t = 62 88 %ile High Average   Errors      0      MEMORY      Norm Score Percentile  Range  BVMT-R            Trial 1    t = 62.0 88 %ile High Average   Trial 2    t = 58 79 %ile High Average   Trial 3    t = 57.0 75 %ile High Average   Total Recall    t = 60.0 84 %ile High Average   Learning    t = 46.0 32 %ile Average   Delayed Recall    t = 59.0 81 %ile High Average   % Retained       (100%) >16 %ile Average   Hits       >16 %ile Average   False Alarms       >16 %ile Average   Recognition Discriminability       >16 %ile Average  HVLT            Total Recall    t = 46.0 32 %ile Average   Delayed Recall    t = 56 73 %ile Average   %Retention    t = 56.0 73 %ile Average   Recognition Discriminability    t = 37.0 9 %ile Low Average   Wechsler Memory Scale, 4th Edition (WMS-4)*           Log. Mem. Immediate Recall    ss = 7 16 %ile Low Average   Logical Memory Delayed Recall    ss = 13 84 %ile High Average   Logical Recognition        >75th %ile High Average   VISUAL-SPATIAL  Norm Score Percentile  Range  WAIS-IV            Block Design    ss = 8 16 %ile Average  Clock                       RBANS Visuospatial Index    SS = 105 63 %ile Average   RBANS Figure Copy    ss = 9 37 %ile Average   RBANS Line Orientation         >75th %ile %ile High Average   PERSONALITY AND BEHAVIORAL FUNCTIONING        Score/Interpretation  GDS-SF Raw         7  GDS-SF Severity         Mild.  GAI Raw         3  GAI Severity         Minimal.    Feedback to Patient: [name] [name] will return on 01/24/2024 for an interactive feedback session with Dr. Matt Holmes at which time his test performances, clinical impressions and treatment recommendations will be  reviewed in detail. The patient understands he can contact our office should he require our assistance before this time.  120 minutes spent face-to-face with patient administering standardized tests, 30 minutes spent scoring Radiographer, therapeutic). [CPT [id number], [id number]]  Full report to follow.

## 2024-01-22 ENCOUNTER — Encounter: Payer: Medicare Other | Admitting: Psychology

## 2024-01-22 DIAGNOSIS — D369 Benign neoplasm, unspecified site: Secondary | ICD-10-CM

## 2024-01-22 DIAGNOSIS — R4189 Other symptoms and signs involving cognitive functions and awareness: Secondary | ICD-10-CM | POA: Diagnosis not present

## 2024-01-22 DIAGNOSIS — F0670 Mild neurocognitive disorder due to known physiological condition without behavioral disturbance: Secondary | ICD-10-CM | POA: Diagnosis not present

## 2024-01-24 ENCOUNTER — Encounter: Payer: Medicare Other | Admitting: Psychology

## 2024-01-24 DIAGNOSIS — F0670 Mild neurocognitive disorder due to known physiological condition without behavioral disturbance: Secondary | ICD-10-CM

## 2024-01-24 DIAGNOSIS — D369 Benign neoplasm, unspecified site: Secondary | ICD-10-CM | POA: Diagnosis not present

## 2024-01-24 DIAGNOSIS — R4189 Other symptoms and signs involving cognitive functions and awareness: Secondary | ICD-10-CM | POA: Diagnosis not present

## 2024-01-24 NOTE — Progress Notes (Signed)
 NEUROPSYCHOLOGICAL EVALUATION [place]  Physical Medicine and Rehabilitation    Patient: [name] [name]  MRN: [id number] DOB: [birth year]  Age: 78 y.o. Sex: male  Race/Ethnicity: White or Caucasian  Years of Education: 12  Collateral Information Source: Wife   Referring Provider: Elijah Birk, DO    Provider/Clinical Neuropsychologist: Thelma Comp, PsyD  Date of Service: 01/22/24 Start Time: 10:00 AM End Time: 11:00 AM  Location of Service:  [facility] Physical Medicine & Rehabilitation Department [place]. [facility] [address], [place] [place], [place] [address] Phone: [phone]  Billing Code/Service:           [phone]  Individuals Present: Thelma Comp, PsyD. 1 hour was spent on interpretation of patient data, interpretation of standardized test results and clinical data, clinical decision making, initial treatment planning/recommendations, and report writing. The report will be amended as needed based on any additional information collected during interactive feedback session.   REASON FOR REFERRAL: Per records, the patient was seen by [facility] on 03/06/23 for memory concerns. He scored 21/30 on MoCA. Records state he no longer drives as he was discovered to be speeding far above the speed limit. He was reportedly independent with ADLs. Records from that visit indicate the patient's spouse reported observing memory difficulties dating to at least 2015, but worsening over the 7 to 8 months preceding the visit.  A workup including MRI of the brain was ordered. He completed an MRI on 04/19/23 which showed a large meningioma, and he was admitted to the hospital via ER for worsening confusion and weakness. He was admitted on 05/08/2023 and underwent planned resection for meningioma. Records indicated he was evaluated by PT/OT and discharged to home on 05/12/2023. Records indicate the patient returned to the ED on 05/21/23, HPI  indicated family reported worsening behavioral changes (i.e., confusion, agitated behavior) and he was ultimately admitted for inpatient rehabilitation. Records from PM&R indicated "Workup was significant for slightly increased epidural fluid collection in the bifrontal cranioplasty site with mass effect on the frontal lobes, as well as new parenchymal hemorrhage in the anterior frontal lobes," and cognitive/behavioral changes consistent frontal dysfunction. Following improvements, he was discharged on 06/07/23. Records from a outpatient follow-up visit with PM&R indicated some ongoing cognitive difficulties consistent with his neurologic injury. Neurocognitive testing was ordered.    Upon interview, the patient, accompanied by his wife with his permission, reported concerns about the patient's cognitive functioning. They hoped to understand the reason for the changes and what could be done to improve symptoms/functioning.   HISTORY OF PRESENTING CONCERNS Cognitive Symptom Onset & Course: During the interview, the patient readily answered all questions but largely deferred to his wife for information regarding symptom onset and course. Per his spouse, the patient began to show difficulties with attention/concentration and memory starting around 2015. Difficulties progressed over time. Cognitive symptoms have improved following acute recovery following surgery, but not returned to baseline.    Current Cognitive Complaints:  Memory:  Patient - The patient endorsed changes involving forgetting details of conversations, recent events, and misplacing things. He feels back to pre-surgery baseline overall. Collateral - His spouse reported observing difficulties in remembering recent events, conversations, and misplacing things. She assists with medications and is concerned he would forget to take them.   Attention & Concentration:  Patient - The patient endorsed difficulties with attention and concentration,  frequently losing train of thought, and struggling with distractibility.  Collateral - His spouse reported observing  the same difficulties described by the patient but feels they have worsened.  Processing Speed:  Patient - The patient denied noticing any changes in processing speed.  Collateral - His spouse reported noticing indications of slowed processing relative to pre-surgery.   Language:  Patient - The patient reported difficulties with word-finding that predate the surgery. He endorsed occasional word-use errors.  Collateral - She feels the above difficulties have worsened. Visual-Spatial:  Patient - The patient reported no clear indications of visual-spatial deficits. Collateral - Consistent with patient's report.  Executive Functioning:  Patient - The patient described decreased frustration tolerance.  Collateral - His spouse reported noticing changes in the patient's behavior and personality. She indicated that he was always a slow driver but began speeding and tailgating last year. She described him as historically being short tempered and "stubborn" and that this has changed markedly. She reported him being much more sentimental and "really sweet." She described him being much more indecisive and reliant on her. She reported reduced activity levels overall, and that he was far less talkative than in the past.    Motor/Sensory Complaints: Denied changes in sense of smell. Reported changes in taste dating to 2001. Reported some age-consistent vision and hearing changes. Reported that it has "been a while" since his last hearing test. He reported experiencing tremor, predominantly in his left hand, present throughout his life. His wife reported noticing changes in his balance and gait since the surgery, with four falls since then. He reported some lightheadedness with postural changes.    Emotional and Behavioral Functioning: Currently, the patient describes as having low mood most days  and not enjoying previously enjoyable activities as much. His wife reported noticing that he is much quieter and seems less happy. He denied any history of suicide attempt but had suicidal ideation approximately 30 years ago. He denied any current suicidal ideation. He denied any current or past difficulties with difficult to control worry or anxiety. He denied any current or past psychiatric treatment in any form. He reported one instance of a possible visual hallucination (seeing silhouette of someone in the hallway) approximately three to four months ago, but nothing prior or since then. He denies any paranoia, symptoms of mania, indications of psychosis, or homicidal ideation past or present. The patient does acknowledge a history of depression.  Sleep: Sleep was reported to be increased relative to baseline. He typically sleeps 9 hours per night. He reported some difficulty with sleep onset that is new since the surgery but no problems with sleep maintenance. He denies any RBS or sleep apnea. He reports reduced energy levels overall.  Appetite: Slight decrease in appetite, no significant unintentional weight loss.   Caffeine: None. Alcohol Use: None.  Tobacco Use: None since heart attack in 2001. Smoked 2.5 to 3 packs per day. Started smoking at age 2.  Recreational Substance Use: None.    Level of Functional Independence: The patient is independent with basic activities of daily living. His spouse reports that he bathes less often than in the past, but not due to functional capabilities.  Finances: Managed by spouse, unchanged from baseline.  Shopping: Occasionally repurchases items he already has (shirts)  Household Maintenance / Chores: Able to do independently, but some problems with motivation.  Meal Preparation: Able to do independently.    Medication Management: Receives assistance from spouse since surgery. His spouse feels he would have difficulty remembering to take medications on his  own.    Driving: Stopped driving due to  family concerns about safety related to speeding, which was a behavior change prior to surgery.      Medical History/Record Review: He reported one head injury during his youth that resulted in loss of consciousness. He played football for four years in high school. He reported having had two heart attacks (2001). Neither the patient nor his spouse reported noticing any cognitive changes following heart attack. The patient denied any treatment involving chemotherapy, or radiation of the head.        Past Medical History:  Diagnosis Date   Bladder infection      10th grade   CAD (coronary artery disease)      s/p 3V CABG 2001   Cervical spondylosis 2002    History of   Colon cancer (HCC)     Diabetes mellitus (HCC)     Dysphagia     GERD (gastroesophageal reflux disease)     Heart murmur      pt has had echo   HTN (hypertension)     Hyperlipidemia     Hypothyroidism     LVH (left ventricular hypertrophy) 2015    Mild   Myocardial infarction (HCC)      x2   PVC (premature ventricular contraction)      a. 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   Meningioma (HCC) 05/21/2023   Brain tumor (HCC) 05/08/2023   S/P craniotomy 05/08/2023   Brain mass 04/19/2023   Acute metabolic encephalopathy 04/19/2023   Unintentional weight loss 02/23/2023   Cognitive deficits 02/23/2023   Dysphagia 02/23/2023   Abdominal pain 02/23/2023   Pancreatitis 02/22/2023   Nausea & vomiting 09/27/2018   Ileus, postoperative (HCC) 09/27/2018   IBS (irritable bowel syndrome) 09/23/2018   Hypomagnesemia 09/20/2018   Cancer of descending colon s/p robotic left hemicolectomy  09/19/2018 08/12/2018   Ventricular tachycardia (HCC) 02/15/2015   PVC's (premature ventricular contractions) 01/20/2015   CORONARY ATHEROSCLEROSIS NATIVE CORONARY ARTERY 08/23/2009   OTHER MALAISE AND FATIGUE 08/23/2009   Essential hypertension, benign 02/22/2009   Diabetes mellitus type 2, noninsulin dependent (HCC) 02/19/2009   Hyperlipidemia 02/19/2009   CAD, ARTERY BYPASS GRAFT 02/19/2009    Imaging/Lab Results:  04/19/2023 MRI HEAD WITHOUT AND WITH CONTRAST  "IMPRESSION: 1. Extra-axial dural-based mass overlying the left greater than right anterior frontal lobes. Contiguous dural thickening and enhancement extending inferiorly and posteriorly along the falx, and along the bilateral frontal lobes. The constellation of findings is favored to reflect a meningioma over dural-based metastatic disease. The mass invades the anterior third of the superior sagittal sinus. Mass effect upon the underlying brain parenchyma as described. Moderate vasogenic edema within the underlying left frontal lobe. 2. Mild generalized parenchymal atrophy."   05/21/2023 CT HEAD WITHOUT CONTRAST  "IMPRESSION: 1. Interval evolution of postoperative changes from recent prior frontal mass resection. 2. An epidural collection deep to the bifrontal cranioplasty has increased in size since the brain MRI of 05/09/2023, now measuring up to 2.8 cm in thickness (previously 2.3 cm). Mass effect on the underlying frontal lobes (left greater than right). Underlying parenchymal edema within the anterior left frontal lobe is similar to the prior MRI. Underlying parenchymal edema within the anterior  right frontal lobe is new. Persistent partial effacement of the frontal horn of the left lateral ventricle. Partial effacement of the frontal horn of the right lateral ventricle, new. 3. There are foci of parenchymal hemorrhage (measuring up to 10 mm) within the anterior frontal lobes, bilaterally. These foci were  not definitively present on the prior MRI. 4. Complete opacification of the left frontal sinus, similar to the prior exam."   Family Neurologic/Medical Hx: Per patient and spouse, the patient's mother showed signs of dementia for three to four years prior to her death at age 58.       Family History  Problem Relation Age of Onset   Heart attack Father 40   Diabetes Father     Diabetes Mother     Heart attack Paternal Grandfather 78    Medications:  pantoprazole (PROTONIX) 40 MG tablet metoprolol succinate (TOPROL XL) 25 MG 24 hr tablet aspirin 81 MG tablet Tamsulosin 0.5mg   atorvastatin (LIPITOR) 10 MG tablet Levothyroxine Sodium 75 MCG CAPS memantine (NAMENDA) 5 MG tablet BID cyanocobalamin (VITAMIN B12) 1000 MCG tablet Multiple Vitamin (MULTIVITAMIN WITH MINERALS) TABS  Cholecalciferol (D2000 ULTRA STRENGTH) 50 MCG (2000 UT)   Educational/Vocational History: The patient graduated high school and was able to earn As and Bs when he applied himself. He reported some weaknesses in reading and concentration while in school. He denied any attentional problems outside the classroom. 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 He had one prior marriage of 10 years. He has an adult daughter who lives in [place].  Living Situation: Alone with his wife.  Daily Activities/Hobbies: Decreased activity levels overall compared to pre-surgery. He primarily watches television and occasionally goes fishing near his home.   NEUROPSYCHODIAGNOSTIC FINDINGS:  Tests Administered: Automatic Data Edition (BNT-2) Brief Visuospatial Memory Test-Revised (BVMT-R) Clock Drawing Test Controlled Oral Word Association Test (FAS & Animals) Orthoptist System (D-KEFS), select subtests Finger Tapping Test (FTT) Hopkins Verbal Learning Test -  Revised (HVLT-R) Repeatable Battery for the Assessment of Neuropsychological Status Update (RBANS) Trail Making Test (TMT; Part A & B) Wechsler Adult Intelligence Scale-Fourth Edition (WAIS-IV), select subtests Wechsler Memory Scale-Fourth Edition (WMS-IV), select subtests  Wechsler Memory Scale-Third Edition (WMS-III), select subtests  Wechsler Test of Adult Reading (WTAR) Geriatric Depression Scale-Short Form (GDS-SF) Geriatric Anxiety Inventory (GAI)  Validity: The patient's scores on embedded performances validity metrics were within normal limits. Behavioral observations noted no indications of inadequate engagement during testing. Validity findings support the interpretation of test data as a reliable estimate of the patient's current cognitive functioning.   Intellectual/Premorbid Functioning Estimate         Norm Score Percentile   Range  Wechsler Test of Adult Reading       SS = 78 7 %ile Below Average  The patient's performance on a word-reading/recognition test used to estimate premorbid functioning was scored in the below average range. Given indications of premorbid weaknesses in reading, there is risk that his performance on this test may underestimate his premorbid cognitive abilities. Based on history, the patient is more likely average to low average with respect to premorbid cognitive functioning.                ATTENTION AND WORKING MEMORY           Norm Score Percentile   Range  WAIS-IV  Digit Span       ss = 6 9 %ile Low Average    DSF       ss = 8 25 %ile Average    Span:           5          DSB       ss = 8 25 %ile Average    Span:           3          DSS       ss = 5 5 %ile Below Average    Span:           3        WMS-III                      Spatial Span       ss = 15 95 %ile Above Average    SSF       ss = 12 75 %ile High Average    Span:           6          SSB       ss = 16 98 %ile Exceptionally High    Span:           6         Auditory verbal attention was scored in the low average range overall.  His performance on subtest showed average range performance for basic span of auditory verbal attention, and for the first of 2 subtests with greater demands on working memory.  On the second subtest he scored in the below average range.  This subtest, relative to the one prior, has slightly greater demands on mental manipulation.  He scored significantly better with a visual attention task, scoring in the above average range overall based.  On component subtests he had high average performance with basic span and exceptionally high range performance with working memory, by age. Overall, he showed strengths in visual attention and relatively weaknesses in auditory-verbal attention that were more marked when mental manipulation demands or greatest.    PROCESSING SPEED           Norm Score Percentile Range  WAIS-IV                      Cancellation*       ss = 10 50 %ile Average  RBANS                      Coding       ss = 7 16 %ile Low Average  Processing speed assessed via rapid symbol-digit translation was scored in the low average range (Coding). Notably, he had several attention to detail errors on the task without which he would have performed in the average range. The patient was administered a measure of processing speed with reduced fine motor demands (cancellation). Notable for validity considerations, the norms for this task cover up to the age of 8 (patient is 53). However, even with a norm comparison that put him at a disadvantage, the patient scored solidly within the average range. Overall, processing speed appears intact and some attention to detail difficulties are noted.   PSYCHOMOTION           Norm Score Percentile   Range  Finger Tapping Test - Dominant  t = 44 27 %ile Average  Finger Tapping Test - Nondominant       t = 42 21 %ile Low Average  Pure motor speed was scored in the average range for his  dominant (Left) hand and low average for his non-dominant (Right) hand. This difference was not statistically significant.   LANGUAGE           Norm Score Percentile   Range  Boston Naming Test (BNT-2)       t = 50 50 %ile Average  COWAT                      FAS       t = 38 12 %ile Low Average    Animals       t = 65 94 %ile Above Average  The patient's confrontation-naming/word-retrieval are intact by age and education. His phonemic verbal fluency was low average while his semantic verbal fluency was scored in the above average range. This was a statistically significant difference. Language appears to be consistent with premorbid estimates within the context of weaknesses in reading. Difficulties in phonemic verbal fluency could reflect influences from executive dysfunction as well.    EXECUTIVE FUNCTIONING           Norm Score Percentile Range  DKEFS - Color-Word Interference                      Color Naming       ss = 9 37 %ile Average    Word Reading       ss = 9 37 %ile Average0    Inhibition       ss = D/C          Errors       ss = D/C        Trails A       t = 52 58 %ile Average    Errors           0        Trails B       t = 62 88 %ile High Average    Errors           0                     WASI-II Similarities    t = 41 18 %ile Low Average   WASI-II Matrix Reasoning    t = 40 16 %ile Low Average   DKEFS - Verbal Fluency                      Category Switching       ss = 9 37 %ile Average    Switching Accuracy       ss = 9 37 %ile Average  The patient's rapid color-naming and color-word reading were both average by age. The patient's performance on the inhibition trial cannot be assessed statistically, due to an administration error involving early discontinuation.  However, qualitatively, he did have some difficulty with this task primarily with an error rate of around 50%, suggestive of difficulty with response inhibition. The patient's psychomotor sequencing speed was  average and he made no errors.  When sequencing was combined with set shifting he had no difficulty and scored in the high average range in speed and made no errors. Similarly, he had no difficulty with set-shifting on a semantic verbal  fluency task and scored average for accuracy and total word generation.  The patient's performance with both and abstract verbal reasoning and abstract nonverbal reasoning/problem solving was scored in the low average range by age.  Overall findings show some weaknesses in basic response inhibition and abstract reasoning but no notable difficulties in set shifting.   MEMORY           Norm Score Percentile   Range  BVMT-R                      Trial 1       t = 62.0 88 %ile High Average    Trial 2       t = 58 79 %ile High Average    Trial 3       t = 57.0 75 %ile High Average    Total Recall       t = 60.0 84 %ile High Average    Learning       t = 46.0 32 %ile Average    Delayed Recall       t = 59.0 81 %ile High Average    % Retained            (100%) >16 %ile Average    Hits             >16 %ile Average    False Alarms             >16 %ile Average    Recognition Discriminability             >16 %ile Average  The patient performed well on a visual memory task. He scored in the high average range for total information encoded across three learning trials and demonstrated typical gains from repetition with an average range score for learning. He demonstrated no difficulty with retrieval or information loss with a high average delayed free recall. He retained 100% of information initially learned and made no errors on the recognition task.    HVLT                      Total Recall       t = 46.0 32 %ile Average    Delayed Recall       t = 56 73 %ile Average    %Retention       t = 56.0 73 %ile Average    Recognition Discriminability       t = 37.0 9 %ile Low Average  With a word-list memory test (presented verbally) the patient was average with total information  learned across three learning trials. Qualitatively, he showed a notable jump in words learned between trials 1 (4 words) and 2 (7 words), and continued to improve with trial 3 (10 words). Following a delay he retained 100% of information learned (average delayed recall and % retention) again demonstrating no problems with retrieval and maintenance. His recognition performance was largely consistent with his learning, but had two false positives that were semantically related to target categories which dropped recognition discriminability to the low average range. Overall, memory appears intact but some possible weaknesses in initial attention are suspected based on the learning curve.   Wechsler Memory Scale, 4th Edition (WMS-4)*                    Log. Mem. Immediate Recall       ss = 8 16 %ile Average  Logical Memory Delayed Recall       ss = 11 84 %ile Average    Logical Recognition             51-75 %ile Average  The patient's performance on a prose memory test showed average range performances with immediate recall, delayed free recall, and delayed recognition.  Due to administration error the patient actually received subtests intended for individuals know older than the age of 58.  Therefore his scores are actually average relative to individuals between the ages of 40-69, and therefore quite strong given his age of 49.  VISUAL-SPATIAL           Norm Score Percentile   Range  WAIS-IV                      Block Design       ss = 8 16 %ile Average  Clock                                           RBANS Visuospatial Index       SS = 105 63 %ile Average    RBANS Figure Copy       ss = 9 37 %ile Average    RBANS Line Orientation             >75th %ile %ile High Average  On a visual construction task requiring problem solving, visual motor integration, and perceptual analysis the patient scored within the average range.  His clock drawing was largely unremarkable with only minor difficulties in  number spacing.  He had no difficulty on a task requiring judgment of line orientation, scoring high average, and visual construction of a line drawing copy was average by age.   PERSONALITY AND BEHAVIORAL FUNCTIONING               Score/Interpretation  GDS-SF Raw                 7  GDS-SF Severity                 Mild.  GAI Raw                 3  GAI Severity                 Minimal.  The patient's endorsements on a self-report measure of depression showed mild depressive symptomology.  His endorsements on a self-report measure of anxiety generated a score in the subclinical range.   SUMMARY / CLINICAL IMPRESSIONS  The patient is a 78 year old gentleman with history of large meningioma detected last year with MRI and within the context of behavioral and motor changes.  He underwent planned resection in July 2024, was discharged home, but readmitted for inpatient rehabilitation due to behavioral difficulties (I.e., confusion, agitation) at home following the discharge.  He was discharged home on 06/07/2023 following improvements.  Records from an outpatient PMNR follow-up visit indicated some ongoing difficulties related to cognition that were consistent with his neurologic history.  Upon interview the patient and his wife expressed concerns about his cognitive functioning and hope to understand the reasons for the changes and what could be done to improve his symptoms and overall functioning.  In terms of premorbid functioning the patient was previously independent.  He described some attentional difficulties that have been lifelong.  Per collateral, the patient began to have some difficulties with memory and worsening problems with attention and concentration as of at least 2015.  Some more recent behavioral changes (aggressive driving) and recent use were reported.  Cognitive changes were reported to progress gradually over time and worsen further with the meningioma.  The patient is intact with basic  activities of daily living, but receives some assistance with instrumental tasks.  He no longer drives due to the previously mentioned concerns.  Patient's medical history is significant for coronary artery disease including heart attack, hyperlipidemia, and hypothyroidism.  As noted in imaging, the patient experienced mass effect on the underlying frontal lobes left greater than right, parenchymal edema within the anterior left frontal lobe, underlying parenchymal edema within the anterior right frontal lobe.  Initial MRI reportedly showed mild generalized parenchymal atrophy, although findings did not indicate whether this was congruent with age.  The patient reported a family history of dementia and his mother (died at age 74).  The patient describes some indications suggestive of depressive symptoms.  He also had a significant life transition with his retirement in 2023.  The patient's performance on testing showed scores largely in line with premorbid estimates.  The patient did show some difficulty in relatively isolated areas.  He showed a weakness in auditory verbal attention relative to visual attention and which was most significant when working memory demands are highest.  He did show some weaknesses in attention, qualitatively, with attention to detail errors on a processing speed task.  Similarly, qualitative analysis of his learning curve on a word list memory test suggested weaknesses in attention.  The next most notable area of difficulty involved areas of executive functioning involving basic response inhibition.  Even though the task was discontinued prematurely, slightly before the halfway mark, his total error count already placed him in the exceptionally low score.  His performance on a verbal and nonverbal abstract reasoning task may reflect declines from baseline but this cannot be stated definitively given ambiguity around premorbid cognitive functioning.  As noted before the patient did  quite well on a visual attention test.  His processing speed performance, when controlling for attention to detail errors in particular, appears intact.  Basic motor speed, psychomotor sequencing, set shifting, visual and verbal memory functioning, visual spatial abilities, and language functioning appear intact.  The patient's overall cognitive profile does align with changes related to frontal dysfunction and consistent with his meningioma. His cognitive profile could also be a factor of premorbid difficulties with attention and concentration.  Additionally, he has significant cardiovascular health concerns placement risk for cerebrovascular disease.  Volume loss was noted in imaging but assessment of chronic change was not a primary concern at the time of that imaging and not reported in relation to age related changes. Additionally, the patient's wife's description of difficulties dating 2015 suggest some level of premorbid cognitive decline which could reflect cerebrovascular changes. Ultimately, the patient's cognitive profile was fairly reassuring overall.  At present the primary areas of difficulty involve auditory verbal attention, and difficulties with basic response inhibition which appear mild, but warrant mention given reports of more risky driving behavior.  Again, changes are consistent with frontal dysfunction, but may also reflect contributions from cerebral vascular disease and pre-existing weaknesses in attention/concentration.  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  Recommendations for compensatory strategies and ways to reduce risk of further cognitive decline are outlined below.  If desired, a  future reevaluation can be conducted to evaluate for change over time.   Diagnosis: Minor neurocognitive disorder   Recommendations:  The patient is strongly encouraged to remain engaged with medical health providers to  continue management of cardiovascular (i.e., heart) health given the connection to cerebrovascular health and risk of cognitive decline.   As discussed during the feedback session, it may be beneficial to have a hearing evaluation. There were some possible indications of reduced hearing based on behavioral observations. This may in-fact be caused by problems with attention and concentration, but should be "double checked" via a formal hearing assessment to confirm.  If the patient intends to return to driving, a formal driving evaluation may be beneficial in confirming it is safe to do so. Primary concerns are less related to visual attention and divided attention and more-so with response inhibition and reports of speeding behavior.  Driving evaluations can be arranged through various organizations, including: -[employer] in [place]: [phone] -[facility]: [phone] [facility]: [phone] [facility]: [phone] or [phone] The patient is likely to benefit in both mood and cognition by increased activity levels overall. Retirement often poses unexpected challenges and significant lifestyle adjustments in order to avoid becoming excessively sedentary and under stimulated. Cognitive and physical stimulation is helpful in reducing risk of cognitive decline and dementia.  Engaging in regular exercise, under physician supervision, in some form is known to improve mood and can also foster cognitive health. Regular exercise can also improve sleep and energy levels.  Regularly engaging in cognitively stimulating activities is also recommended. Social interaction is particularly beneficial in terms of cognitive stimulation. Cognitive stimulation can take many forms. Finding activities you enjoy, but which require some degree of concentration/attention, are ideal. Having a range of activities is also ideal as novelty/variety is particularly  stimulating for our minds/brains. This is also likely to improve overall mood and wellness.  Cognitive Strategies: Based on the patient's cognitive profile, there are several strategies which may be helpful for improving day to day functioning.  Avoid dividing your attention when being given information, verbally, that you want to remember later. The speaker is also encouraged to make sure you are attending (I.e., making eye contact etc). Your memory is good overall and the main struggle appears to be related to attention/concentration (I.e., the "front door" for information to get into your memory).  You do much better with attention / concentration for visual information. Using written reminders, color coding, calendars in places that are visible, and visual reminders is recommended. (e.g., put something you need to mail somewhere you can't miss it) strength is recommended.  Writing down information you are told, when you need to remember it, is strongly encouraged as it forces you to pay attention.  When needing to concentrate reduce environmental distractions, avoid working when fatigued, and give yourself breaks.  When forgetting why you entered a room for something, go back to where you started / had the original thought. Try to put yourself back into the place ("what was I doing / thinking right before I decided to get up and go to that room").  Try and be patient with yourself. Frustration makes it harder to problem solve and can make it harder to think.  Memory for specific pieces of information can be improved with repetition. Writing down what someone says is actually a form of repetition and can help later recall. Restating something, even informally (e.g., "So, what I'm hearing you say is...") is another form  of repetition.  When trying to memorize new information, like a grocery list, try and group the information into categories. Providing structure when learning information is likely going  to help both learning and later retrieval.  Stay active!  There is no clear need for re-evaluation at this time, but one could be conducted if deemed beneficial by the referring and patient. At this point, focusing on ensuring chronic health conditions are managed, remaining active/stimulated, and adjusting to life in retirement is recommended.  Should the patient find that difficulties with feelings of low mood become more prominent or interfere with overall functioning, the patient  and family are encouraged to reach out to providers so it can be addressed.  This report was generated using voice recognition software. While this document has been carefully reviewed, transcription errors may be present. I apologize in advance for any inconvenience. Please contact me if further clarification is needed.             Thelma Comp, PsyD             Neuropsychologist

## 2024-01-27 ENCOUNTER — Encounter: Payer: Self-pay | Admitting: Psychology

## 2024-01-27 NOTE — Progress Notes (Signed)
   NEUROPSYCHOLOGICAL EVALUATION [place]  Physical Medicine and Rehabilitation     Patient: [name] [name]  MRN: [id number] DOB: 1946/08/07   Service Provider/Clinical Neuropsychologist: Thelma Comp, PsyD  Date of Service: 01/24/24 Start Time: 10:00 AM End Time: 11:00 AM<  Location of Service:  [facility] Physical Medicine & Rehabilitation Department [place] [address] [place], [place] [address] Phone: [phone]   Billing Code/Service: 96132/ interactive feedback appointment for neuropsychological evaluation   Individuals present: Patient was seen, accompanied by his wife, by the provider in-person int he provider's office for 60 minutes for the feedback appointment.    The provider reviewed and discussed the results of neuropsychological testing evaluation with the patient and family in full. This included overall findings, diagnosis, and treatment planning/recommendations that were derived from integration of patient data, interpretation of standardized rest results and clinical data, and clinical decision making, which are documented in the patient's electronic medical record (encounter date listed below)  The patient expressed understanding of the information reviewed. The patient was provided opportunity to ask questions which were answered by the provider. The provider worked collaboratively to tailor treatment recommendations to the patient when possible. The patient was informed they would be provided a copy of the full report and the patient was encouraged to reach out to the provider with any additional questions that might arise relating to the evaluation and findings.   The final neuropsychological evaluation report, documented in the patient's chart on encounter dated 01/22/24, was amended to reflect any additional information obtained during the feedback appointment included treatment planning  collaboration.    This report was generated using voice recognition software. While this document has been carefully reviewed, transcription errors may be present. I apologize in advance for any inconvenience. Please contact me if further clarification is needed.             Thelma Comp, PsyD             Neuropsychologist

## 2024-02-26 ENCOUNTER — Other Ambulatory Visit: Payer: Self-pay

## 2024-02-26 MED ORDER — METOPROLOL SUCCINATE ER 25 MG PO TB24: 25.0000 mg | ORAL_TABLET | Freq: Every day | ORAL | 1 refills | Status: DC

## 2024-03-03 ENCOUNTER — Encounter: Payer: Self-pay | Admitting: Cardiovascular Disease

## 2024-03-03 ENCOUNTER — Ambulatory Visit: Payer: Medicare Other | Attending: Cardiovascular Disease | Admitting: Cardiovascular Disease

## 2024-03-03 VITALS — BP 116/52 | HR 59 | Ht 66.0 in | Wt 186.2 lb

## 2024-03-03 DIAGNOSIS — I493 Ventricular premature depolarization: Secondary | ICD-10-CM | POA: Diagnosis not present

## 2024-03-03 DIAGNOSIS — I25118 Atherosclerotic heart disease of native coronary artery with other forms of angina pectoris: Secondary | ICD-10-CM | POA: Diagnosis not present

## 2024-03-03 DIAGNOSIS — I10 Essential (primary) hypertension: Secondary | ICD-10-CM | POA: Diagnosis not present

## 2024-03-03 DIAGNOSIS — E785 Hyperlipidemia, unspecified: Secondary | ICD-10-CM | POA: Diagnosis not present
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
## 2024-03-03 NOTE — Patient Instructions (Signed)
 Medication Instructions:  No changes *If you need a refill on your cardiac medications before your next appointment, please call your pharmacy*  Lab Work: none If you have labs (blood work) drawn today and your tests are completely normal, you will receive your results only by: MyChart Message (if you have MyChart) OR A paper copy in the mail If you have any lab test that is abnormal or we need to change your treatment, we will call you to review the results.  Testing/Procedures: none  Follow-Up: At [facility], you and your health needs are our priority.  As part of our continuing mission to provide you with exceptional heart care, our providers are all part of one team.  This team includes your primary Cardiologist (physician) and Advanced Practice Providers or APPs (Physician Assistants and Nurse Practitioners) who all work together to provide you with the care you need, when you need it.  Your next appointment:   12 month(s)  Provider:   Antoinette Batman, MD    We recommend signing up for the patient portal called "MyChart".  Sign up information is provided on this After Visit Summary.  MyChart is used to connect with patients for Virtual Visits (Telemedicine).  Patients are able to view lab/test results, encounter notes, upcoming appointments, etc.  Non-urgent messages can be sent to your provider as well.   To learn more about what you can do with MyChart, go to [email or web address].   Other Instructions

## 2024-03-04 ENCOUNTER — Telehealth: Payer: Self-pay | Admitting: Cardiovascular Disease

## 2024-03-04 NOTE — Telephone Encounter (Signed)
 Pt's spouse requesting cb to see if pt can take Viagra

## 2024-03-04 NOTE — Telephone Encounter (Signed)
 Returned call to [name] [name].  Adv per Dr. Abel Hoe it is okay to use Viagra.  Adv caution with use while prescribed ntg. - do not use Viagra within 72 hrs of nitroglycerin  and vice versa.  She voices understanding.

## 2024-06-26 ENCOUNTER — Other Ambulatory Visit: Payer: Self-pay | Admitting: Neurosurgery

## 2024-06-26 DIAGNOSIS — D320 Benign neoplasm of cerebral meninges: Secondary | ICD-10-CM

## 2024-06-27 ENCOUNTER — Ambulatory Visit
Admission: RE | Admit: 2024-06-27 | Discharge: 2024-06-27 | Disposition: A | Discharge status: Home / Self Care | Source: Ambulatory Visit | Attending: Neurosurgery | Admitting: Neurosurgery

## 2024-06-27 DIAGNOSIS — D320 Benign neoplasm of cerebral meninges: Secondary | ICD-10-CM

## 2024-06-27 MED ORDER — GADOPICLENOL 0.5 MMOL/ML IV SOLN
10.0000 mL | Freq: Once | INTRAVENOUS | Status: AC | PRN
  Administered 2024-06-27: 8 mL via INTRAVENOUS

## 2024-08-29 ENCOUNTER — Other Ambulatory Visit: Payer: Self-pay | Admitting: Cardiology

## 2024-10-24 ENCOUNTER — Other Ambulatory Visit: Payer: Self-pay | Admitting: Cardiology

## 2024-11-25 ENCOUNTER — Other Ambulatory Visit: Payer: Self-pay | Admitting: General Surgery

## 2024-11-25 DIAGNOSIS — D171 Benign lipomatous neoplasm of skin and subcutaneous tissue of trunk: Secondary | ICD-10-CM

## 2024-11-27 ENCOUNTER — Ambulatory Visit
Admission: RE | Admit: 2024-11-27 | Discharge: 2024-11-27 | Disposition: A | Discharge status: Home / Self Care | Source: Ambulatory Visit | Attending: General Surgery | Admitting: General Surgery

## 2024-11-27 DIAGNOSIS — D171 Benign lipomatous neoplasm of skin and subcutaneous tissue of trunk: Secondary | ICD-10-CM

## 2024-11-27 MED ORDER — IOPAMIDOL (ISOVUE-370) INJECTION 76%
70.0000 mL | Freq: Once | INTRAVENOUS | Status: AC | PRN
  Administered 2024-11-27: 70 mL via INTRAVENOUS

## 2024-12-09 ENCOUNTER — Other Ambulatory Visit: Payer: Self-pay | Admitting: Neurosurgery

## 2024-12-09 DIAGNOSIS — D320 Benign neoplasm of cerebral meninges: Secondary | ICD-10-CM

## 2024-12-10 ENCOUNTER — Encounter: Payer: Self-pay | Admitting: Neurosurgery

## 2024-12-23 ENCOUNTER — Encounter: Admitting: Thoracic Surgery (Cardiothoracic Vascular Surgery)

## 2024-12-30 ENCOUNTER — Encounter: Admitting: Thoracic Surgery (Cardiothoracic Vascular Surgery)

## 2025-01-09 ENCOUNTER — Other Ambulatory Visit

## 2025-03-06 ENCOUNTER — Ambulatory Visit: Admitting: Cardiovascular Disease
